# Patient Record
Sex: Male | Born: 1935 | Race: White | Hispanic: No | Marital: Married | Smoking: Former smoker
Health system: Southern US, Community
[De-identification: ages and names within clinical notes are randomized; demographics above are authoritative.]

## PROBLEM LIST (undated history)

## (undated) DIAGNOSIS — I4510 Unspecified right bundle-branch block: Secondary | ICD-10-CM

## (undated) DIAGNOSIS — E7800 Pure hypercholesterolemia, unspecified: Secondary | ICD-10-CM

## (undated) DIAGNOSIS — K219 Gastro-esophageal reflux disease without esophagitis: Secondary | ICD-10-CM

## (undated) DIAGNOSIS — I10 Essential (primary) hypertension: Secondary | ICD-10-CM

## (undated) DIAGNOSIS — N400 Enlarged prostate without lower urinary tract symptoms: Secondary | ICD-10-CM

## (undated) HISTORY — DX: Unspecified right bundle-branch block: I45.10

## (undated) HISTORY — PX: REPLACEMENT TOTAL KNEE: SUR1224

## (undated) HISTORY — PX: BACK SURGERY: SHX140

## (undated) HISTORY — PX: JOINT REPLACEMENT: SHX530

## (undated) HISTORY — DX: Benign prostatic hyperplasia without lower urinary tract symptoms: N40.0

---

## 1998-03-18 ENCOUNTER — Encounter: Admission: RE | Admit: 1998-03-18 | Discharge: 1998-04-15 | Payer: Self-pay | Admitting: Orthopaedic Surgery

## 2000-07-13 ENCOUNTER — Ambulatory Visit (HOSPITAL_COMMUNITY): Admission: RE | Admit: 2000-07-13 | Discharge: 2000-07-13 | Payer: Self-pay | Admitting: Gastroenterology

## 2000-07-13 ENCOUNTER — Encounter (INDEPENDENT_AMBULATORY_CARE_PROVIDER_SITE_OTHER): Payer: Self-pay | Admitting: Specialist

## 2000-09-24 ENCOUNTER — Encounter: Payer: Self-pay | Admitting: Orthopaedic Surgery

## 2000-09-27 ENCOUNTER — Inpatient Hospital Stay (HOSPITAL_COMMUNITY): Admission: RE | Admit: 2000-09-27 | Discharge: 2000-10-01 | Payer: Self-pay | Admitting: Orthopaedic Surgery

## 2001-03-14 ENCOUNTER — Inpatient Hospital Stay (HOSPITAL_COMMUNITY): Admission: RE | Admit: 2001-03-14 | Discharge: 2001-03-19 | Payer: Self-pay | Admitting: Orthopaedic Surgery

## 2001-12-30 ENCOUNTER — Encounter: Admission: RE | Admit: 2001-12-30 | Discharge: 2001-12-30 | Payer: Self-pay | Admitting: Gastroenterology

## 2001-12-30 ENCOUNTER — Encounter: Payer: Self-pay | Admitting: Gastroenterology

## 2002-06-05 ENCOUNTER — Encounter: Payer: Self-pay | Admitting: Gastroenterology

## 2002-06-05 ENCOUNTER — Encounter: Admission: RE | Admit: 2002-06-05 | Discharge: 2002-06-05 | Payer: Self-pay | Admitting: Gastroenterology

## 2003-10-15 ENCOUNTER — Encounter: Admission: RE | Admit: 2003-10-15 | Discharge: 2003-10-15 | Payer: Self-pay | Admitting: Family Medicine

## 2004-03-29 ENCOUNTER — Ambulatory Visit (HOSPITAL_COMMUNITY): Admission: RE | Admit: 2004-03-29 | Discharge: 2004-03-29 | Payer: Self-pay | Admitting: Gastroenterology

## 2006-02-13 HISTORY — PX: EYE SURGERY: SHX253

## 2007-01-27 ENCOUNTER — Emergency Department (HOSPITAL_COMMUNITY): Admission: EM | Admit: 2007-01-27 | Discharge: 2007-01-27 | Payer: Self-pay | Admitting: Emergency Medicine

## 2010-06-21 ENCOUNTER — Other Ambulatory Visit: Payer: Self-pay | Admitting: Family Medicine

## 2010-06-21 DIAGNOSIS — M545 Low back pain: Secondary | ICD-10-CM

## 2010-06-24 ENCOUNTER — Ambulatory Visit
Admission: RE | Admit: 2010-06-24 | Discharge: 2010-06-24 | Disposition: A | Discharge status: Home / Self Care | Payer: Medicare Other | Source: Ambulatory Visit | Attending: Family Medicine | Admitting: Family Medicine

## 2010-06-24 DIAGNOSIS — M545 Low back pain: Secondary | ICD-10-CM

## 2010-07-01 NOTE — Discharge Summary (Signed)
. [facility]  Patient:    Ricky Bradley, NISHIYAMA Visit Number: [id number] MRN: [id number]          Service Type: SUR Location: 5000 5004 [id number] Attending Physician:  Randolm Idol Dictated by:   Jamelle Rushing, P.A. Admit Date:  09/27/2000 Discharge Date: 10/01/2000                             Discharge Summary  ADMISSION DIAGNOSES: 1. Severe osteoarthritis bilateral knees, left greater than right. 2. Gastric reflux. 3. Benign prostatic hypertrophy.  DISCHARGE DIAGNOSES: 1. Left total knee arthroplasty. 2. Gastric reflux. 3. Benign prostatic hypertrophy.  HISTORY OF PRESENT ILLNESS:  The patient is a 77 year old white male with a history of about six years of bilateral knee pain.  Both knees were scoped about five years ago with significant improvement, but over the last year the left pain returned and significantly worsened over the last six months.  The patient had to stop working due to the pain and difficulty ambulating up and down ladders.  He has a catching sensation causing him to fall.  The pain is located mostly in the medial aspect of the knee and has described it as sharp with occasional radiation up into the thigh.  ALLERGIES:  No known drug allergies.  CURRENT MEDICATIONS: 1. Prilosec 10 mg p.o. q.d. 2. Allegra D 1 tablet p.o. q.d. 3. Aleve p.r.n. 4. Advil p.r.n. 5. Iron 325 mg p.o. b.i.d.  SURGICAL PROCEDURE:  On September 27, 2000, the patient was taken to the OR by Dr. Norlene Campbell, assisted by Arnoldo Morale, P.A.-C.  Under general anesthesia, a left total knee replacement was performed with the following components:  DePuy LCS complete standard plus femoral component, a cemented #4 tibial tray with a 12.5 bridging bearing and a metal-backed ______ designed patella.  All of this was secured with polymethyl methacrylate.  The patient tolerated the procedure well.  No Hemovac drain was left in place and the patient did have a  postoperative femoral nerve block for pain control.  ROUTINE CONSULTATIONS:  Physical therapy, occupational therapy, rehab, and case management were requested on August 15.  HOSPITAL COURSE:  On September 27, 2000, the patient was admitted to [facility] under the care of Dr. Norlene Campbell.  The patient was taken to the OR where a left total knee arthroplasty was performed.  The patient tolerated this procedure well and was transferred to the recovery room and then to the orthopedic floor for routine postoperative recovery and rehab.  The patient was placed on Coumadin for routine DVT prophylaxis.  The patient then incurred a four-day postoperative course, in which he had no significant untoward events.  The patient progressed well with physical therapy.  His vital signs remained stable.  He had no significant temperature and he was afebrile on discharge.  His pain was well controlled with p.o. pain medicines after being on a PCA for the first 24 hours.  The patient did progress very nicely with physical therapy and was discharged to home on postop day #4 with arrangements made through [facility] for home health physical therapy and a home health R.N. for pro time draws.  LABORATORY DATA:  EKG on admission was normal sinus rhythm at 86 beats per minute.  Routine CBC on August 18:  WBC 10.1 down from 11.7 day previous, hemoglobin 11.8, hematocrit 34.2, platelets 199.  Coags on August 19:  PT was 19.6 with an INR of 2.0.  August 17:  Sodium 135, potassium 3.5, glucose 155, BUN 6, creatinine 1.1.  Routine urinalysis on admission was negative.  DISCHARGE MEDICATIONS:  1. Tylenol 650 mg p.o. q.4h. p.r.n.  2. Laxative of choice/enema of choice p.r.n.  3. Benadryl 25 mg p.o. q.6h. p.r.n.  4. Colace 100 mg p.o. b.i.d.  5. Claritin 10 mg p.o. q.d.  6. Skelaxin 1-2 tablets every 4-6 hours p.r.n. spasms.  7. Percocet 1 or 2 tablets every 4-6 hours p.r.n. pain.  8. Protonix 40  mg p.o. q.d.  9. Sudafed 120 mg p.o. q.d. 10. Restoril 15 mg p.o. q.h.s. p.r.n. 11. Coumadin 4 mg p.o. q.d.  DISCHARGE INSTRUCTIONS:  1. Medications:  The patient is to continue routine home medications.  2. Percocet 5 mg one or two tablets every four to six hours for pain if     needed.  3. Coumadin 4 mg one tablet a day as directed by [place] pharmacist.  4. Activity:  As tolerated with the use of a walker.  No driving.  5. Diet:  No restrictions.  6. Wound care:  Keep wound clean and dry.  May shower.  Check daily for     infection.  7. Special instructions:  [facility] R.N. pro times first draw on     August 21.  8. Follow-up:  The patient is to call for a followup appointment with     Dr. Cleophas Dunker in 10 days. Dictated by:   Jamelle Rushing, P.A. Attending Physician:  Randolm Idol DD:  11/08/00 TD:  11/08/00 Job: [phone]

## 2010-07-01 NOTE — Discharge Summary (Signed)
[place]. [facility]  Patient:    Ricky Bradley, Ricky Bradley Visit Number: [id number] MRN: [id number]          Service Type: SUR Location: 5000 [id number] [id number] Attending Physician:  Randolm Idol Dictated by:   Arnoldo Morale, P.A.C. Admit Date:  03/14/2001 Discharge Date: 03/19/2001   CC:         Teena Irani. Arlyce Dice, M.D.   Discharge Summary  ADMISSION DIAGNOSES: 1. Osteoarthritis right knee, status post left total knee replacement. 2. Mild gastroesophageal reflux disease. 3. Benign prostatic hypertrophy. 4. Sensitivity to all narcotic pain medications.  DISCHARGE DIAGNOSES: 1. End-stage osteoarthritis right knee, status post left knee replacement. 2. Acute blood loss anemia secondary to surgery. 3. Mild confusion/hallucinations from narcotic pain medications. 4. Possible mild urinary tract infection. 5. Mild gastroesophageal reflux disease. 6. Benign prostatic hypertrophy.  SURGICAL PROCEDURE:  On March 14, 2001, Ricky Bradley underwent a right total knee arthroplasty by Ricky Bradley. Cleophas Bradley, M.D., assisted by Arnoldo Morale, P.A.C., and Ricky Bradley, P.A. student.  COMPLICATIONS:  None.  CONSULTS: 1. Pharmacy consult for Coumadin therapy March 15, 2001. 2. Physical therapy and ortho tech consult March 15, 2001. 3. Occupational therapy consult March 16, 2001. 4. Case management consult March 18, 2001.  HISTORY OF PRESENT ILLNESS:  This 77 year old white male patient presents to Ricky Bradley with a history of a left knee replaced by Ricky Bradley in August of 2002.  He did very well after the left knee replacement and has been having pain in his right knee and wishes to have that replaced. The right knee has been bothering him for about seven to 10 years.  He has had arthroscopy on it in the past. The pain is intermittent sharp pain which becomes knifelike over the posterior lateral aspect of the knee and the center of the joint. There is no other  radiation.  It increases with any strenuous lifting or ambulation on uneven ground.  It decreases with rest.  The knee gives at times and does swell.  He has failed conservative treatment and because of this, he is presenting for a right total knee arthroplasty.  HOSPITAL COURSE:  Mr. Marcucci tolerated his surgical procedure well without immediate postoperative complications. He was subsequently transferred to 5000.  On postop day #1, T-max was 99.6, vital signs were stable.  Hemovac was discontinued.  Hemoglobin was 11.9, hematocrit 34.4.  Leg was neurovascularly intact.  He was switched from PCA to p.o. Vicodin for pain and he was started on PT per protocol.  On postop day #2, he was afebrile, vital signs stable.  Right knee incision well-approximated with staples.  He did complain of some nausea with Vicodin and was switched to Talwin for pain.  He was continued on therapy.  On March 17, 2001, he remained afebrile.  He did have some complaints of hallucinations with the Talwin and that was discontinued.  He was switched only to Tylenol for pain.  On March 18, 2001, T-max was 100, vital signs were stable. Right knee wound was unchanged.  He was having some difficulty with burning with urination and did have a history of prostatitis so a UA and C&S was obtained and he was started on Cipro 250 p.o. b.i.d. just for five days.  On March 19, 2001, he was doing well and reported that the dysuria and frequency had resolved on the Cipro.  T-max at that time was 99.8 and vitals were stable.  Hemoglobin was stable at 10.4 with hematocrit  of 30.5.  He was felt to be stable for discharge home and was discharged home that day.  DISCHARGE INSTRUCTIONS: 1. Diet:  He can resume his prehospitalization diet. 2. Medications:  He can resume all prehospitalization medications except he    is not to resume the Advil while he is on the Coumadin.  Additional    medications then would include:    a.  Prilosec 10 mg p.o. q.d.    b. Allegra 180 mg p.o. q.d.    c. Ferrous sulfate 325 mg p.o. b.i.d.    d. Additional medications from this hospitalization will be:       1. Tylenol one to two tablets p.o. q.4h. p.r.n. for pain.       2. Cipro 250 mg one tablet p.o. b.i.d. seven with no refill.       3. Coumadin 6 mg one tablet p.o. q.d. with [facility] to adjust          the dose as needed.       4. Darvocet-N 100 one to two p.o. q.4h. p.r.n. for severe pain, 30 with          no refill. 3. Activity:  He is to be out of bed partial weightbearing 50% or less on    the right leg with use of a walker.  He is arranged for home health    physical therapy per [facility] and also the R.N. to draw    protimes and pharmacy to monitor his Coumadin therapy. 4. Wound care:  He needs to keep the right knee incision clean and dry.  May    shower after no drainage from the wound for two days.  He is to notify    Ricky Bradley of a temperature greater than or equal to 101.5 degrees F.,    chills, pain unrelieved by pain medications for foul smelling drainage    from the wound. 5. Follow-up:  He needs to follow up with Ricky Bradley on March 27, 2001,    or March 29, 2001, and he needs to call [phone] to set up that    appointment.  He stated good understanding of these instructions and was discharged home.  LABORATORY DATA:  March 15, 2001, white count was 12, hemoglobin 11.9, hematocrit 34.4.  On March 16, 2001, white count 12.4, hemoglobin 11.2, hematocrit 32.2.  On March 17, 2001, white count 12.4, hemoglobin 10.6, hematocrit 30.2.  On March 18, 2001, white count 11.8, hemoglobin 10.8, hematocrit 31.6.  On March 19, 2001, white count 11.4, hemoglobin 10.4, hematocrit 30.5.  On March 16, 2001, his PT was 15.1, INR 1.3.  On March 19, 2001, PT 19.4, INR 1.9.  On March 11, 2001, glucose 129.  On March 15, 2001, potassium 3.4, glucose 158, calcium 8.3.   March 16, 2001, glucose 183, calcium 8.2.  All other  laboratory studies were within normal limits. Dictated by:   Arnoldo Morale, P.A.C. Attending Physician:  Randolm Idol DD:  04/10/01 TD:  04/10/01 Job: [id number]

## 2010-07-01 NOTE — Op Note (Signed)
. [facility]  Patient:    Ricky Bradley, Ricky Bradley                         MRN: [id number] Proc. Date: 09/27/00 Adm. Date:  [id number] Attending:  Randolm Idol                           Operative Report  PREOPERATIVE DIAGNOSIS:  Osteoarthritis, left knee.  POSTOPERATIVE DIAGNOSIS:  Osteoarthritis, left knee.  PROCEDURE:  Left total knee replacement.  SURGEON:  Claude Manges. Cleophas Dunker, M.D.  ASSISTANT:  Arnoldo Morale, P.A.  ANESTHESIA:  General orotracheal.  COMPLICATIONS:  None.  COMPONENTS:  DePuy LCS Complete standard plus femoral component, a cemented #4 tibial tray with a 12.5 mm bridging bearing, and the metal-backed, cruciate-designed patella, all this secured with polymethyl methacrylate.  DESCRIPTION OF PROCEDURE:  With the patient comfortable on the operating table and under general orotracheal anesthesia, the left lower extremity was placed in a thigh tourniquet.  The left leg was then prepped with Betadine scrub and then Duraprep, the tourniquet to the midfoot.  Sterile draping was performed. With the extremity still elevated, it was Esmarch exsanguinated with the proximal tourniquet at 350 mmHg.  A midline longitudinal incision was made and by sharp dissection carried down to subcutaneous tissue.  The first layer of capsule was incised in the midline.  A medial parapatellar incision was then made with the Bovie.  There was little, if any, joint effusion.  There was mild to moderate beefy-red synovial tissue, which was debrided.  The knee was then flexed to 90 degrees, the patella everted 180 degrees. There were large osteophytes along the medial and lateral femoral condyle. There was a fixed varus position, medial release was performed, and osteophytes along the medial tibial plateau, which were also debrided. Preoperatively we had measured either a large or a standard plus femoral component.  The standard plus femoral component was  confirmed intraoperatively.  We also confirmed a #4 tibial tray.  Initial cut was made on the tibia.  We felt that a 10 mm flexion-extension gap were symmetrical. The femoral cuts were then made, sacrificing the ACL and PCL.  A 4 degree distal femoral valgus cut was utilized.  Laminar spreaders were inserted in either compartment to remove medial and lateral menisci as well as remnants of ACL and PCL.  The next to last cut was made at the tibia to accept the tibial stem.  We removed approximately 10 mm of patella to accept the cruciate-backed patellar component.  We inserted all the trial components and felt that we had most stability with a 12.5 mm bridging bearing with equal opening varus and valgus and slight hyperextension.  We were able to flex well beyond 120 degrees without any malrotation of the component or dislocation of the patella.  Trial components were removed.  The joint was then copiously irrigated with jet saline and antibiotic solution.  Retractors were inserted.  The tibia was impacted with polymethyl methacrylate.  Extraneous methacrylate was removed. The 12.5 mm bridging bearing was inserted.  The femoral component was impacted with methacrylate.  Again excess methacrylate was removed.  The joint was placed in extension.  The patella was applied with methacrylate and a patella clamp.  After complete maturation of the methacrylate, the joint was inspected.  There were several areas of extraneous methacrylate still present.  These were removed with an osteotome.  The joint was again placed through a full range of motion with excellent stability.  We felt that our flexion-extension gaps were symmetrical at that point.  The tourniquet was deflated, gross bleeders Bovie coagulated.  The deep capsule was closed with interrupted 0 Tycron, superficial capsule closed with running 0 Vicryl, subcutaneous tissue with 2-0 Vicryl, and the skin closed with skin clips.  Sterile  bulky dressing was applied, followed by an Ace bandage with a knee immobilizer.  The patient was to receive a postanesthesia femoral nerve block for pain control. DD:  09/27/00 TD:  09/27/00 Job: [id number]

## 2010-07-01 NOTE — H&P (Signed)
[place]. [facility]  Patient:    Ricky Bradley, Ricky Bradley                        MRN: [id number] Adm. Date:  09/27/00 Attending:  Claude Manges. Cleophas Dunker, M.D. Dictator:   Jamelle Rushing, P.A.                         History and Physical  DATE OF BIRTH:  05-Aug-1933.  CHIEF COMPLAINT:  Bilateral knee pain, left greater than right.  HISTORY OF PRESENT ILLNESS:  The patient is a 77 year old white male with about a 5 to 6 year history of bilateral knee pain.  Approximately 4 to 5 years ago the patient had a left knee arthroscope for evaluation and debridement and did have improvement up until about 1 year ago.  The patient stated he had return of his knee discomfort which significantly worsened over the last 6 months.  The patient had significant difficulty with ambulation and climbing ladders and he had to stop working due to his discomfort.  The pain was located over the medial joint line.  He does have a catching sensation. He has difficulty with range of motion, he has night pain.  The pain is described as a quick sharp pain with ackward movements and position.  It does occasionally radiate up into the hip.  The problems with his right knee are similar but currently is not a significant problem.  ALLERGIES:  No known drug allergies.  CURRENT MEDICATIONS: 1. Prilosec 10 mg p.o. q.d. 2. Alegra D 1 tablet p.o. q.d. 3. Aleve p.r.n. 4. Advil p.r.n. 5. Ferrous sulfate 325 mg p.o. b.i.d.  PREVIOUS MEDICAL HISTORY:  The patient states he has a nervous stomach with increased stomach gas for which he takes Prilosec and it gives him relief. The patient also has a history of BPH.  The patient also recently had a significant history of left-sided sinus infection with an onset around Mothers Day, and had most of his symptoms relieved after an evaluation of multiple times and several different antibiotics.  The patient was evaluated a week ago by his ENT and was described to  be completely relieved of signs but the patient still has symptoms of some pressure behind his left ear. Otherwise the patient denies any other specific history of diabetes, hypertension, thyroid disease, hiatal hernia, peptic ulcer disease, heart disease or asthma.  SURGICAL HISTORY: 1. Vasectomy in 1967. 2. Nose surgery x 2. 3. Right and left arthroscopic surgery.  The patient denies any complications with any of the above mentioned surgical procedures other than some nausea with anesthesia.  SOCIAL HISTORY:  The patient is a 77 year old white male who appears to be physically fit.  Denies any history of smoking or alcohol use.  The patient is currently married.  He does have 3 children.  He lives in a 2 story house with 3 steps at the main entrance.  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  FAMILY PHYSICIAN:  Kristian Covey, M.D. at [facility], [phone].  FAMILY HISTORY:  Mother is alive in good health with a history of hypertension at 31 years of age.  Father is deceased at 26 years of age from heart failure. The patient has 3 sisters alive, all in good health.  REVIEW OF SYSTEMS:  Positive for recent cold and sinusitis predominantly on his left  side.  The patient still has some residual full feeling behind his left ear.  He has had multiple courses of amoxicillin, Augmentin, and a Z-Pak and has been evaluated by an ENT for this problem.  The patient does use glasses for reading and driving.  The patient does have some shortness of breath with deconditioning due to his bilateral knee pain.  The patient states he does have occasional problems with increased urgency and urinary frequency.  PHYSICAL EXAMINATION:  VITAL SIGNS:  Height is 6 foot, weight is 200 pounds.  Pulse is 96, respirations 12, temperature 98.0.  Blood pressure 132/84.  GENERAL:  This is a healthy appearing adult male of appeared stated age.  He does ambulate  with a left-sided limp.  He does appear to have some varus deformity of his left leg.  The patient is able to get on and off the examination table without much difficulty.  HEENT:  Normocephalic and atraumatic, nontender over maxillary or frontal sinuses.  Pupils, equal, round, reactive to light and accommodation. Extraocular movements are intact. Sclerae is nonicteric.  Conjunctivae is pink and moist.  External ears without deformities.  Canals patent.  TMs were pearly gray on the right.  Left, they were pearly gray with some slight erythematous ejection down the middle.  There was no obvious signs of purulence or serous fluid behind the TMs.  Canals were nonerythematous.  Gross hearing was intact.  Traguses were nontender to manipulation.  Nasal septum was midline.  Mucous membranes pink and moist.  No polyps noted.  Oral buccal mucosa was pink and moist without lesions.  Dentition was in fair repair, uvula was midline and moves symmetrically with phonation.  NECK:  Supple, no palpable lymphadenopathy.  Thyroid gland was nontender.  The patient had good range of motion of his cervical spine without any difficulty or tenderness.  CHEST: Lung sounds were clear and equal bilaterally.  No wheezes, rales, rhonchis or rubs noted.  HEART:  Regular rate and rhythm with S1, S2 auscultated.  No murmur, rub or gallops noted.  ABDOMEN:  Round, soft, nontender, bowel sounds were present throughout.  No hepatosplenomegaly notable.  CVA was nontender to percussion.  EXTREMITIES:  Upper extremities were symmetrical in size and shape and the patient had good range of motion of his shoulders, elbows and wrists without any difficulty.  He had 5/5 motor strength in all muscle groups.  LOWER EXTREMITIES:  Right and left hip had symmetrical range of motion with full extension, flexion, back to 130 degrees, 20-30 degrees internal external rotation without any difficulty.  The left knee had medial joint  line tenderness.  No palpable tenderness along the lateral joint line.  No palpable  effusion.  No sign of erythema or ecchymosis.  He had a moderate amount of crepitus with full extension and flexion back to 115 degrees.  He had about 5 degrees valgus-varus laxity.  He did have a palpable bakers cyst and he had no calf tenderness.  Right knee had minimal amount of medial and lateral joint line tenderness.  He had no sign of erythema or ecchymosis.  No effusion. Range of motion was full extension and flexion down to 115.  He had a minimal amount of crepitus with range of motion.  Calf was nontender.  Bilateral ankles were symmetrically size and shape with good dorsi and plantar flexion.  NEUROLOGIC:  The patient was conscious, alert and appropriate.  Held an easy conversation with the examiner.  Cranial nerves II-XII were  grossly intact. Deep tendon reflexes of the upper and lower extremities were symmetrical right to left.  The patient was grossly intact to light touch sensation from head to toe.  PERIPHERAL VASCULATURE:  Carotid pulses 2+, radial pulses 2+, femoral pulses 1+.  Dorsalis pedis and posterior tibial pulses were 2+.  The patient had no lower extremity edema or venous stasis changes noted.  BREAST/RECTAL AND GU EXAMINATIONS:  Deferred at this time.  IMPRESSION: 1. Severe osteoarthritis bilateral knees left greater than right. 2. Gastric reflux. 3. Benign prostatic hypertrophy.  PLAN:  The patient will be admitted to [facility] on September 27, 2000, under the care of Dr. Norlene Campbell.  The patient has donated 2 units of autologous blood.  The patient will undergo all routine labs and tests for his planned left total knee arthroplasty on September 27, 2000. DD:  09/24/00 TD:  09/24/00 Job: [id number]

## 2010-07-01 NOTE — Op Note (Signed)
Ricky Bradley, Ricky Bradley                  ACCOUNT NO.:  [id number]   MEDICAL RECORD NO.:  [id number]          PATIENT TYPE:  AMB   LOCATION:  ENDO                         FACILITY:  [facility]   PHYSICIAN:  Petra Kuba, M.D.    DATE OF BIRTH:  October 11, 1933   DATE OF PROCEDURE:  03/29/2004  DATE OF DISCHARGE:                                 OPERATIVE REPORT   PROCEDURE:  Colonoscopy.   INDICATIONS FOR PROCEDURE:  Right-sided abdominal pain.  Due for colonic  screening.   CONSENT:  Consent was sighed after the risks, benefits, methods, and options  were thoroughly discussed in the office on multiple occasions.   MEDICATIONS GIVEN:  Demerol 80, Versed 8.   DESCRIPTION OF PROCEDURE:  Rectal inspection was pertinent for external  hemorrhoids, small.  Digital exam was negative.  The video pediatric  adjustable colonoscope was inserted and fairly easily advanced around the  colon to the cecum.  This did require some abdominal pressure but no  position changes.  On insertion, a rare transverse erosion was seen,  probably nonsteroidal-induced.  The cecum was identified by the appendiceal  orifice and the ileocecal valve.  With abdominal pressure, the scope was  able to be advanced into the terminal ileum which was normal.  Photo  documentation was obtained.  The scope was slowly withdrawn.  The prep was  adequate.  There were some occasional right and left-sided diverticula, but  no other abnormalities were seen as we slowly withdrew back to the rectum.  Anorectal pullthrough and retroflexion confirmed some small hemorrhoids.  The scope was straightened and readvanced a short ways up the left side of  the colon, air was suctioned, and the scope removed.  The patient tolerated  the procedure well.  There were no obvious immediate complications.   ENDOSCOPIC DIAGNOSES:  1.  Internal and external hemorrhoids.  2.  Occasional left and rare right diverticula.  3.  Rare transverse erosion, probably  nonsteroidal-induced.  4.  Otherwise within normal limits to the terminal ileum.   PLAN:  Repeat screening in five years.  Consider CAT scan next.  Otherwise,  will check off his chart and see what other antispasmodics we can try which  might be helpful.  He seems to be having some side effects from Robinul,  even though it tends to help.  He will follow up in two months or he can  call me sooner p.r.n.      MEM/MEDQ  D:  03/29/2004  T:  03/29/2004  Job:  [id number]   cc:   [facility]

## 2010-07-01 NOTE — H&P (Signed)
[place]. [facility]  Patient:    Ricky Bradley, Ricky Bradley Visit Number: [id number] MRN: [id number]          Service Type: SUR Location: RCRM 2550 05 Attending Physician:  Randolm Idol Dictated by:   Arnoldo Morale, P.A.-C. Admit Date:  03/14/2001                           History and Physical  DATE OF BIRTH:  May 31, 1933  CHIEF COMPLAINT:  Right knee pain for the last seven to 10 years.  HISTORY OF PRESENT ILLNESS:  This 77 year old white male patient presents to Dr. Claude Manges. [place] with a history of a left knee replacement by Dr. Cleophas Dunker on September 27, 2000.  He has done very well after his left knee replacement, and wishes to have his right knee replaced.  He has had a 7-10 year history of progressively worsening right knee pain.  No known injury to the knee in the past, but he does have a history of a right knee arthroscopy by Dr. Paul Dykes. [place] Ruts. in 1985, or 1986.  At this point the pain in the right knee is described as an intermittent sore or sharp-type of pain, which becomes knife-like at times.  It seems to be located mainly around the posterolateral aspect of the knee, and then also again in the center of the joint.  It is present with any strenuous lifting or if he has to ambulate on uneven ground or stand or walk for a prolonged period of time.  It decreases with rest.  The knee does give way at times and swells occasionally.  There is no popping, catching, grinding, or pain at night.  He is not currently taking any medications for pain, and he has not tried cortisone or Hyalgan in the past.  He is using a pullover knee sleeve when he is up and about, and he has not required the use of any assistive devices.  ALLERGIES:  OXYCODONE caused severe nervousness and hallucinations.  CURRENT MEDICATIONS 1. Prilosec 10 mg one tab p.o. q.d. 2. Allegra 180 mg one tab p.o. q.d. 3. Advil 200 mg, three tab p.o. t.i.d. p.r.n. pain. 4. Ferrous  sulfate 325 mg one tab p.o. b.i.d.  PAST MEDICAL HISTORY 1. He complains of a nervous stomach with increased gas, for which he    is treated with Prilosec. 2. He also has a history of benign prostatic hypertrophy which he has    recently had some problems with urinary retention.  He was taken off    of Allegra D and placed just on Allegra.  He denies any history of high blood pressure, diabetes mellitus, thyroid disease, hiatal hernia, peptic ulcer disease, heart disease, asthma, or any other chronic medical condition other than noted previously.  PAST SURGICAL HISTORY 1. Vasectomy by Dr. Marta Lamas in 1967. 2. Nasal surgery by Dr. Kristian Covey in 1975. 3. Right knee arthroscopy by Dr. Fannie Knee in 1986. 4. Left knee arthroscopy by Dr. Cleophas Dunker in 1995. 5. Excision of sinus polyps by Dr. Keturah Barre in 1996. 6. Left total knee arthroscopy on September 27, 2000, by Dr. Cleophas Dunker.  SOCIAL HISTORY:  He does report he smokes an occasional cigar.  He denies any alcohol use or drug use.  He is married and has three children.  He currently lives in a two-story house with his wife, with three steps into the main entrance.  He is retired from [employer], and is a retired Visual merchandiser.  His medical doctor is Dr. Teena Irani. Arlyce Dice at [facility], and their phone [phone].  FAMILY HISTORY:  His mother is alive at age 69, with a history of hypertension.  His father passed away at age 40, with heart failure.  He has three sisters ages 20, 19, and 109.  They are all alive and well.  His three children are two boys ages 53 and 101, and a daughter age 60.  They are all alive and healthy.  REVIEW OF SYSTEMS:  He does use glasses for reading and driving.  He has shortness of breath ambulating at a fast pace for about 100 yards.  He believes this is due to his right knee pain.  He does complain of some urinary urgency and frequency, and he has nocturia once during the night.  He has  some problems with constipation, due to the iron, and had a lot of difficulty tolerating the narcotics after his last surgery.  He really got himself off of narcotics as soon as possible, and did have problems with constipation.  He did donate 2 units of autologous blood.  All other systems are negative and noncontributory at this time.  PHYSICAL EXAMINATION  GENERAL:  A well-developed, well-nourished white male, in no acute distress. He has a very slight limp on the right.  Mood and affect are appropriate.  He talks easily with the examiner.  Height 6 feet 0 inches, weight 210 pounds. BMI is 27-1/2.  VITAL SIGNS:  Temperature 97 degrees F, pulse 104, respirations 16, blood pressure 124/70.  HEENT:  Normocephalic, atraumatic without frontal or maxillary sinus tenderness to palpation.  Conjunctivae pink.  Sclerae anicteric.  PERRLA. EOMs intact.  Funduscopic exam shows good red reflex bilaterally with normal-appearing retinal vasculature, optic disc and cup.  No visible external ear deformities.  Hearing is grossly intact.  Tympanic membranes pearly gray bilaterally with good light reflex.  Nose and nasal septum midline.  Nasal mucosa pink and moist, without exudates or polyps noted.  Buccal mucosa pink and moist.  Dentition in fair repair with multiple fillings in place.  Pharynx without erythema or exudates.  Tongue and uvula midline.  Tongue is without fasciculations.  Uvula arises equally with phonation.  NECK:  No visible masses or lesions noted.  Trachea midline.  No palpable lymphadenopathy or thyromegaly.  Carotids +2 bilaterally without bruits. Nontender to palpation, with a full range of motion of his cervical spine.  CARDIOVASCULAR:  Heart rate and rhythm regular.  S1, S2 present, without rubs, clicks, or murmurs noted.  LUNGS:  Respirations even and unlabored.  Breath sounds clear to auscultation bilaterally without rales or wheezes noted.  ABDOMEN:  Rounded abdominal  contour.  Bowel sounds present x four quadrants. Soft, nontender to palpation, without hepatosplenomegaly or CVA tenderness.   EXTREMITIES:  Femoral pulses +2 bilaterally.  BACK:  Nontender to palpation along the entire length of the vertebral column.  BREASTS/GU/RECTAL:  These examinations are deferred at this time.  MUSCULOSKELETAL:  No obvious deformity in the bilateral upper extremities, with full range of motion of these extremities, without pain.  Radial pulses are +2 bilaterally.  He has a full range of motion of his hips, ankles, and toes bilaterally.  DP and PT pulses are +2.  Left knee has a well-healed midline incision with full extension and flexion to about 105 degrees.  There is no effusion in the knee, and no  crepitus.  No pain with palpation along the joint line.  The knee is stable to varus and valgus stress.  The right knee has no obvious deformity.  The skin is intact without erythema or ecchymosis. There is no effusion of the knee.  He does have a moderate amount of crepitus with range of motion of the right knee.  He has full extension and flexion to 105 degrees.  His calves are nontender bilaterally.  The right knee is stable to varus and valgus stress.  DP and PT pulses are +2 bilaterally.  NEUROLOGIC:  Alert and oriented x 3.  Cranial nerves II-XII are grossly intact.  Strength is 5/5 bilateral upper and lower extremities.  Rapid alternating movements intact.  Deep tendon reflexes 2+ bilateral upper and lower extremities.  RADIOLOGICAL FINDINGS:  X-rays taken in June 2002, of the right knee show bone on bone contact in the medial compartment of the right knee, with significant osteophytes.  IMPRESSION 1. Osteoarthritis, right knee. 2. Status post left knee replacement. 3. Mild reflux. 4. Benign prostatic hypertrophy.  PLAN:  Mr. Stamour will be admitted to [facility] on March 14, 2001, where he will undergo a right total knee  arthroplasty by Dr. Cleophas Dunker.  He will undergo all the routine preoperative laboratory tests and studies prior to this procedure.  If we have any medical issues while he is hospitalized, we will consult Dr. Dara Hoyer. Dictated by:   Arnoldo Morale, P.A.-C. Attending Physician:  Randolm Idol DD:  03/12/01 TD:  03/12/01 Job: [id number]

## 2010-07-01 NOTE — Procedures (Signed)
[place]. [facility]  Patient:    Ricky Bradley, Ricky Bradley                           MRN: [id number] Proc. Date: 07/13/00 Attending:  Petra Kuba, M.D. CC:         Delorse Lek, M.D.   Procedure Report  PROCEDURE:  Colonoscopy with polypectomy.  INDICATION:  Patient with a history of colon polyps, here for repeat screening.  Consent was signed after risks, benefits, methods, and options thoroughly discussed multiple times in the past.  MEDICATIONS:  Demerol 90 mg, Versed 10 mg.  DESCRIPTION OF PROCEDURE:  Rectal inspection was pertinent for external hemorrhoids.  Digital exam was negative.  The pediatric video colonoscope was inserted, easily advanced around the colon to the cecum.  We had used the regular scope in the past and would probably want to use that in the future. Unfortunately, none were available.  The cecum was identified by the appendiceal orifice and the ileocecal valve.  On insertion, some rare left-sided diverticula were seen but no other abnormalities.  Prep on the right side of the colon and the cecum were found.  There was some stool adherent to the wall on the right which could not be washed off.  The rest of the prep was adequate.  Lots of washing and suctioning were done on the right side.  Small lesions could have been missed, but no large lesion was seen. The scope was then slowly withdrawn.  The ascending was normal, as was most of the transverse.  In the distal transverse, the occasional tiny to small polyp was seen, as was the mid-descending and sigmoid, and multiple hot biopsies were obtained.  Some polyps required two hot biopsies.  The scope was slowly withdrawn back to the rectum.  Probably a total of six tiny to small polyps were seen on the transverse, descending, and sigmoid and were all hot biopsied, put in the same container.  A rare left-sided diverticulum was seen. Back in the rectum, some hyperplastic-appearing polyps  were seen.  A couple were hot biopsied, one was cold biopsied.  The slightly larger one was hot biopsied x 2.  None were worrisome-looking and all were tiny to small.  The scope was retroflexed, revealing some internal hemorrhoids.  The scope was straightened, air withdrawn, and the scope removed.  Patient tolerated the procedure well.  There was no obvious immediate complication.  ENDOSCOPIC DIAGNOSES: 1. Internal-external hemorrhoids. 2. Rare left-sided diverticula. 3. Multiple tiny to small polyps in the rectum, sigmoid, descending, and    transverse, mostly hot biopsied, occasional cold biopsy of a hyperplastic-    appearing one. 4. Otherwise within normal limits to the cecum with fair prep in the cecum and    ascending.  Small lesions could have been missed in that area.  PLAN:  Await pathology to determine future colonic screening.  GI follow-up p.r.n.  Routine postpolypectomy instructions.  Yearly rectals and guaiacs per the [employer], but happy to see back as above. DD:  07/13/00 TD:  07/13/00 Job: [id number]

## 2010-07-01 NOTE — Op Note (Signed)
[place]. [facility]  Patient:    Ricky Bradley, Ricky Bradley Visit Number: [id number] MRN: [id number]          Service Type: SUR Location: 5000 5036 01 Attending Physician:  Randolm Idol Dictated by:   Claude Manges. Cleophas Dunker, M.D. Proc. Date: 03/14/01 Admit Date:  03/14/2001                             Operative Report  PREOPERATIVE DIAGNOSIS:  End stage osteoarthritis, right knee.  POSTOPERATIVE DIAGNOSIS:  End-stage osteoarthritis, right knee.  OPERATION PERFORMED:  Right total knee replacement.  SURGEON:  Claude Manges. Cleophas Dunker, M.D.  ASSISTANT:  Arnoldo Morale, P.A.  ANESTHESIA:  General orotracheal.  COMPLICATIONS:  None.  COMPONENTS:  Depuy LCS complete standard plus cemented femoral component, #5 rotating tibial platform cemented with a 15 mm bridging bearing and a metal back cruciate designed rotating patella cemented.  DESCRIPTION OF PROCEDURE:  With the patient comfortable on the operating table and under general orotracheal anesthesia, the right lower extremity was placed in the thigh tourniquet.  The leg was then prepped with Betadine scrub and then DuraPrep from the tourniquet to the midfoot.  Sterile draping was performed.  With the extremity still elevated, it was Esmarch exsanguinated with the proximal tourniquet at 350 mHg.  A midline longitudinal incision was made centered over the patella extending from the superior pouch to the tibial tubercle.  By sharp dissection, the incision was carried down to the subcutaneous tissue through the first layer of capsule.  A medial parapatellar incision was then made through the deep capsule.  The patella was everted 180 degrees with the knee flexed at 90 degrees.  There was approximately 10 to 15 cc clear yellow joint effusion. There was an abundant amount of synovitis.  A synovectomy was performed. There were large osteophytes along the patella circumferentially in both medial and lateral femoral  condyle.  There was complete absence of articular cartilage although on the medial femoral condyle and at least half of the medial tibial plateau with the varus position of the knee.  I could correct it easily to neutral, i.e. it was not fixed.  Preoperatively, we had templated either a large or a standard plus femoral component.  The standard plus component was confirmed intraoperatively.  We also had measured either a 4 or 5 tibial tray and the 5 was confirmed intraoperatively.  The appropriate femoral and tibial jigs to obtain the appropriate femoral and tibial cuts.  We used a 4 degree distal femoral valgus bony cut.  LCL and MCL remained intact. PCL and ACL were sacrificed.  A 12.5 mm flexion and extension gap were perfectly symmetrical.  Lamina spreaders were inserted to remove medial and lateral menisci and remnants of ACL and PCL as well as spurs behind the femur.  There were osteophytes along the medial tibial plateau.  The tibial tray was then applied and the appropriate cuts made for the keeled tibial component.  The trial components were then applied.  We trialed the 12.5 mm bridging bearing and actually thought that a 15 mm tighter and still allowed slight hyperextension and a 50 was a much better fit.  Through a full range of motion the components were perfectly stable.  The patella was then applied by removing 10 mm of bone leaving 14 mm of thickness.  Cruciate design was then applied.  The patella was reduced and placed through a full range of  motion without lateral subluxation.  The trial components were then removed.  The joint was copiously irrigated with saline solution, antibiotic solution.  Each of the final components was then applied with polymethyl methacrylate, i.e. the #5 rotating tibial platform with a 15 mm bridging bearing and the standard plus femoral component.  Extraneous methacrylate was removed.  The patella was applied with cement and a bone clamp.   Extraneous methacrylate was removed from it circumferentially.  The knee was placed in full extension.  We thought we had excellent position of the components.  After complete maturation of the methacrylate, the knee was placed through a full range of motion with instability and further extraneous methacrylate was removed with an osteotome. Again the joint was irrigated with antibiotic solution.  Tourniquet deflated and bleeders were Bovie coagulated.  Hemovac inserted.  The deep capsule was then closed with interrupted #1 Ethibond.  Superficial capsule with running 0 Vicryl, the subcutaneous with 2-0 Vicryl, the skin closed with skin clips. Sterile bulky dressing was applied.  Femoral nerve was to be applied by Anesthesia for postoperative pain control.  The patient tolerated the procedure without complication. Dictated by:   Claude Manges. Cleophas Dunker, M.D. Attending Physician:  Randolm Idol DD:  03/14/01 TD:  03/14/01 Job: [phone] [phone] [id number]

## 2010-08-14 HISTORY — PX: OTHER SURGICAL HISTORY: SHX169

## 2010-08-18 ENCOUNTER — Other Ambulatory Visit (HOSPITAL_COMMUNITY): Payer: Self-pay | Admitting: Neurosurgery

## 2010-08-18 ENCOUNTER — Encounter (HOSPITAL_COMMUNITY)
Admission: RE | Admit: 2010-08-18 | Discharge: 2010-08-18 | Disposition: A | Discharge status: Home / Self Care | Payer: Medicare Other | Source: Ambulatory Visit | Attending: Neurosurgery | Admitting: Neurosurgery

## 2010-08-18 ENCOUNTER — Ambulatory Visit (HOSPITAL_COMMUNITY)
Admission: RE | Admit: 2010-08-18 | Discharge: 2010-08-18 | Disposition: A | Discharge status: Home / Self Care | Payer: Medicare Other | Source: Ambulatory Visit | Attending: Neurosurgery | Admitting: Neurosurgery

## 2010-08-18 DIAGNOSIS — I10 Essential (primary) hypertension: Secondary | ICD-10-CM | POA: Insufficient documentation

## 2010-08-18 DIAGNOSIS — M48061 Spinal stenosis, lumbar region without neurogenic claudication: Secondary | ICD-10-CM

## 2010-08-18 DIAGNOSIS — Z01812 Encounter for preprocedural laboratory examination: Secondary | ICD-10-CM | POA: Insufficient documentation

## 2010-08-18 DIAGNOSIS — Z01810 Encounter for preprocedural cardiovascular examination: Secondary | ICD-10-CM | POA: Insufficient documentation

## 2010-08-18 DIAGNOSIS — Z01811 Encounter for preprocedural respiratory examination: Secondary | ICD-10-CM | POA: Insufficient documentation

## 2010-08-18 LAB — CBC
HCT: 42.6 % (ref 39.0–52.0)
Hemoglobin: 15.2 g/dL (ref 13.0–17.0)
MCV: 83.2 fL (ref 78.0–100.0)
WBC: 8.9 10*3/uL (ref 4.0–10.5)

## 2010-08-18 LAB — BASIC METABOLIC PANEL
BUN: 17 mg/dL (ref 6–23)
Chloride: 104 mEq/L (ref 96–112)
Glucose, Bld: 163 mg/dL — ABNORMAL HIGH (ref 70–99)
Potassium: 4 mEq/L (ref 3.5–5.1)

## 2010-08-22 ENCOUNTER — Encounter: Payer: Self-pay | Admitting: *Deleted

## 2010-08-22 ENCOUNTER — Emergency Department (INDEPENDENT_AMBULATORY_CARE_PROVIDER_SITE_OTHER): Payer: Medicare Other

## 2010-08-22 ENCOUNTER — Emergency Department (HOSPITAL_BASED_OUTPATIENT_CLINIC_OR_DEPARTMENT_OTHER)
Admission: EM | Admit: 2010-08-22 | Discharge: 2010-08-22 | Disposition: A | Discharge status: Home / Self Care | Payer: Medicare Other | Source: Home / Self Care | Attending: Emergency Medicine | Admitting: Emergency Medicine

## 2010-08-22 DIAGNOSIS — M545 Low back pain: Secondary | ICD-10-CM

## 2010-08-22 DIAGNOSIS — N419 Inflammatory disease of prostate, unspecified: Secondary | ICD-10-CM | POA: Insufficient documentation

## 2010-08-22 DIAGNOSIS — K5900 Constipation, unspecified: Secondary | ICD-10-CM

## 2010-08-22 DIAGNOSIS — R319 Hematuria, unspecified: Secondary | ICD-10-CM

## 2010-08-22 DIAGNOSIS — R109 Unspecified abdominal pain: Secondary | ICD-10-CM | POA: Insufficient documentation

## 2010-08-22 DIAGNOSIS — N400 Enlarged prostate without lower urinary tract symptoms: Secondary | ICD-10-CM

## 2010-08-22 HISTORY — DX: Gastro-esophageal reflux disease without esophagitis: K21.9

## 2010-08-22 HISTORY — DX: Pure hypercholesterolemia, unspecified: E78.00

## 2010-08-22 HISTORY — DX: Essential (primary) hypertension: I10

## 2010-08-22 LAB — CBC
HCT: 42.7 % (ref 39.0–52.0)
Hemoglobin: 15.3 g/dL (ref 13.0–17.0)
RBC: 5.25 MIL/uL (ref 4.22–5.81)
RDW: 13.3 % (ref 11.5–15.5)
WBC: 9.8 10*3/uL (ref 4.0–10.5)

## 2010-08-22 LAB — COMPREHENSIVE METABOLIC PANEL
ALT: 16 U/L (ref 0–53)
AST: 15 U/L (ref 0–37)
CO2: 26 mEq/L (ref 19–32)
Chloride: 103 mEq/L (ref 96–112)
Creatinine, Ser: 1 mg/dL (ref 0.50–1.35)
GFR calc non Af Amer: >60 mL/min (ref >60)
Total Bilirubin: 0.5 mg/dL (ref 0.3–1.2)

## 2010-08-22 LAB — URINALYSIS, ROUTINE W REFLEX MICROSCOPIC
Glucose, UA: NEGATIVE mg/dL
Leukocytes, UA: NEGATIVE
Specific Gravity, Urine: 1.005 (ref 1.005–1.030)
pH: 7 (ref 5.0–8.0)

## 2010-08-22 LAB — DIFFERENTIAL
Basophils Absolute: 0 10*3/uL (ref 0.0–0.1)
Lymphocytes Relative: 21 % (ref 12–46)
Monocytes Absolute: 0.8 10*3/uL (ref 0.1–1.0)
Monocytes Relative: 9 % (ref 3–12)
Neutro Abs: 6.6 10*3/uL (ref 1.7–7.7)

## 2010-08-22 LAB — LACTIC ACID, PLASMA: Lactic Acid, Venous: 1.6 mmol/L (ref 0.5–2.2)

## 2010-08-22 MED ORDER — FENTANYL CITRATE 0.05 MG/ML IJ SOLN
100.0000 ug | Freq: Once | INTRAMUSCULAR | Status: AC
  Administered 2010-08-22: 100 ug via INTRAVENOUS
  Filled 2010-08-22: qty 2

## 2010-08-22 MED ORDER — CIPROFLOXACIN HCL 500 MG PO TABS: 500.0000 mg | ORAL_TABLET | Freq: Two times a day (BID) | ORAL | Status: AC

## 2010-08-22 NOTE — ED Notes (Signed)
To Ed via EMS c.o lower bil abd pain, bloating, constipation. No BM in 2 days. Today freq urination 10-20 x's before calling EMS. Has appointment for back surgery in 2 more days.

## 2010-08-22 NOTE — ED Provider Notes (Addendum)
History     Chief Complaint  Patient presents with  . Abdominal Pain  . Constipation   Patient is a 77 y.o. male presenting with abdominal pain and constipation. The history is provided by the patient. No language interpreter was used.  Abdominal Pain The primary symptoms of the illness include abdominal pain. The primary symptoms of the illness do not include fever, fatigue, shortness of breath, vomiting, diarrhea, hematemesis or hematochezia. Primary symptoms comment: urinary frequency and flank pain and constipation The current episode started more than 2 days ago. The onset of the illness was gradual. The problem has not changed since onset. The illness is associated with a recent illness (patient is scheduled for back surgery this week). Additional symptoms associated with the illness include constipation and frequency. Symptoms associated with the illness do not include chills, anorexia or diaphoresis. Significant associated medical issues do not include PUD, GERD, inflammatory bowel disease, gallstones, substance abuse, diverticulitis or HIV.  Constipation  Associated symptoms include abdominal pain. Pertinent negatives include no anorexia, no fever, no diarrhea, no hematemesis and no vomiting. His past medical history does not include inflammatory bowel disease.    Past Medical History  Diagnosis Date  . Hypertension   . GERD (gastroesophageal reflux disease)   . High cholesterol     History reviewed. No pertinent past surgical history.  History reviewed. No pertinent family history.  History  Substance Use Topics  . Smoking status: Former Games developer  . Smokeless tobacco: Not on file  . Alcohol Use: No      Review of Systems  Constitutional: Negative for fever, chills, diaphoresis and fatigue.  Eyes: Negative for discharge.  Respiratory: Negative for shortness of breath.   Gastrointestinal: Positive for abdominal pain and constipation. Negative for vomiting, diarrhea,  hematochezia, anorexia and hematemesis.  Genitourinary: Positive for frequency.  Musculoskeletal: Negative.   Neurological: Negative.   Hematological: Negative.   Psychiatric/Behavioral: Negative.     Physical Exam  BP 175/102  Pulse 110  Temp(Src) 98 F (36.7 C) (Oral)  SpO2 98%  Physical Exam  Constitutional: He appears well-developed and well-nourished.  HENT:  Head: Normocephalic and atraumatic.  Eyes: Pupils are equal, round, and reactive to light.  Neck: Normal range of motion. Neck supple.  Cardiovascular: Normal rate and regular rhythm.   Pulmonary/Chest: Effort normal and breath sounds normal.  Abdominal: Soft. Bowel sounds are normal. There is no rebound.  Musculoskeletal: Normal range of motion.  Neurological: He is alert.  Skin: Skin is warm and dry.  Psychiatric: His behavior is normal.    ED Course  Procedures  MDM Previous records reviewed      Domenique Southers K Preet Mangano-Rasch, MD 08/22/10 2123  Jasmine Awe, MD 09/18/10 1742

## 2010-08-22 NOTE — ED Notes (Signed)
ZOX:[id number]<AV> Expected date:08/22/10<BR> Expected time: 5:27 PM<BR> Means of arrival:Ambulance<BR> Comments:<BR> [facility] Enroute 77yo Constipation

## 2010-08-24 ENCOUNTER — Inpatient Hospital Stay (HOSPITAL_COMMUNITY)
Admission: RE | Admit: 2010-08-24 | Discharge: 2010-08-27 | DRG: 491 | Disposition: A | Discharge status: Home / Self Care | Payer: Medicare Other | Source: Ambulatory Visit | Attending: Neurosurgery | Admitting: Neurosurgery

## 2010-08-24 ENCOUNTER — Inpatient Hospital Stay (HOSPITAL_COMMUNITY): Payer: Medicare Other

## 2010-08-24 DIAGNOSIS — M48061 Spinal stenosis, lumbar region without neurogenic claudication: Principal | ICD-10-CM | POA: Diagnosis present

## 2010-08-24 DIAGNOSIS — IMO0002 Reserved for concepts with insufficient information to code with codable children: Secondary | ICD-10-CM | POA: Diagnosis not present

## 2010-08-24 DIAGNOSIS — R339 Retention of urine, unspecified: Secondary | ICD-10-CM | POA: Diagnosis not present

## 2010-08-24 DIAGNOSIS — N9989 Other postprocedural complications and disorders of genitourinary system: Secondary | ICD-10-CM | POA: Diagnosis not present

## 2010-08-24 DIAGNOSIS — Y838 Other surgical procedures as the cause of abnormal reaction of the patient, or of later complication, without mention of misadventure at the time of the procedure: Secondary | ICD-10-CM | POA: Diagnosis not present

## 2010-08-24 DIAGNOSIS — Z01812 Encounter for preprocedural laboratory examination: Secondary | ICD-10-CM

## 2010-08-25 NOTE — Op Note (Signed)
Ricky Bradley, HAGOS NO.:  [id number]  MEDICAL RECORD NO.:  [id number]  LOCATION:  3535                         FACILITY:  [facility]  PHYSICIAN:  Cristi Loron, M.D.DATE OF BIRTH:  October 12, 1933  DATE OF PROCEDURE:  08/24/2010 DATE OF DISCHARGE:                              OPERATIVE REPORT   BRIEF HISTORY:  The patient is a 77 year old who has suffered from back and leg pain consistent with neurogenic claudication.  He has failed medical management and worked up with MRI which demonstrated spinal stenosis at L3-L4 and L4-5.  I discussed various treatment options of surgery.  He has weighed the risks, benefits, and alternatives of surgery and decided to proceed with L3-L4 laminectomy.  PREOPERATIVE DIAGNOSES:  L3-4 and L4-5 spinal stenosis, lumbago, lumbar radiculopathy.  POSTOPERATIVE DIAGNOSIS:  L3-4 and L4-5 spinal stenosis, lumbago, lumbar radiculopathy.  PROCEDURES:  L4 laminectomy with bilateral L3 laminotomies to decompress bilateral L3, L4, and L5 nerve roots using a microdissection.  SURGEON:  Cristi Loron, MD  ASSISTANT:  Hilda Lias, MD  ANESTHESIA:  General endotracheal.  ESTIMATED BLOOD LOSS:  50 mL.  SPECIMENS:  None.  DRAINS:  None.  COMPLICATIONS:  None.  DESCRIPTION OF PROCEDURE:  The patient was brought to the operating room by the anesthesia team.  General endotracheal anesthesia was induced. The patient was turned to the prone position on the Wilson frame.  His lumbosacral region was then prepared with Betadine scrub and Betadine solution.  Sterile drapes were applied and then injected the area to be incised with Marcaine with epinephrine solution.  A scalpel to make a linear midline shift over the L3-4 and L4-5 interspaces.  I used electrocautery to perform a bilateral subperiosteal dissection exposing spinous processes of lamina of L3 and L4.  We obtained intraoperative radiograph to confirm location and then  inserted the [facility] retractor for exposure.  I began decompression by incising the interspinous ligament at L3-4 and L4-5 with the scalpel.  I used Leksell rongeur to remove the L4 spinous process.  I then used high-speed drill to perform bilateral L3 and L4 laminotomies.  I completed the laminectomy at L4 and widened laminotomies at L3 using Kerrison punch.  We have also used the Kerrison punch to remove the L3-4 and L4-5 ligamentum flavum.  We then brought the operative microscope into the field and under its magnification illumination, we completed the microdissection/decompression.  I used microdissection to free up the thecal sac and nerve roots from the epidural tissue and I then used Kerrison punch to perform foraminotomies about the bilateral L3, L4, and L5 nerve roots.  We then inspected the intervertebral disk at L3-4 and L4-5 and noted there was no disk herniations.  We then palpated along the ventral surface of the thecal sac along the exit route of the L3, L4, and L5 nerve roots and then noted that neural structures were well decompressed.  Then obtained hemostasis using bipolar cautery.  We irrigated the wound out with bacitracin solution.  We then removed the retractor and reapproximated the patient's thoracolumbar fascia with interrupted #1 Vicryl suture, subcutaneous with interrupted 2-0 Vicryl suture and the skin with Steri-Strips and  Benzoin.  The wound was then coated with bacitracin ointment.  Sterile dressing was applied.  The drapes were removed and the patient was subsequently returned to supine position where he was extubated by the anesthesia team and transported to the post anesthesia care unit in stable condition.  All sponge, instrument and needle counts were correct at the end of this case.     Cristi Loron, M.D.     JDJ/MEDQ  D:  08/24/2010  T:  08/25/2010  Job:  [id number]  Electronically Signed by Tressie Stalker M.D. on 08/25/2010  08:00:26 AM

## 2010-08-26 ENCOUNTER — Inpatient Hospital Stay (HOSPITAL_COMMUNITY): Payer: Medicare Other

## 2010-09-14 ENCOUNTER — Emergency Department (HOSPITAL_COMMUNITY): Payer: Medicare Other

## 2010-09-14 ENCOUNTER — Emergency Department (HOSPITAL_COMMUNITY)
Admission: EM | Admit: 2010-09-14 | Discharge: 2010-09-14 | Disposition: A | Discharge status: Home / Self Care | Payer: Medicare Other | Attending: Emergency Medicine | Admitting: Emergency Medicine

## 2010-09-14 DIAGNOSIS — R339 Retention of urine, unspecified: Secondary | ICD-10-CM | POA: Insufficient documentation

## 2010-09-14 DIAGNOSIS — M549 Dorsalgia, unspecified: Secondary | ICD-10-CM | POA: Insufficient documentation

## 2010-09-14 DIAGNOSIS — I10 Essential (primary) hypertension: Secondary | ICD-10-CM | POA: Insufficient documentation

## 2010-09-14 DIAGNOSIS — N400 Enlarged prostate without lower urinary tract symptoms: Secondary | ICD-10-CM | POA: Insufficient documentation

## 2010-09-14 DIAGNOSIS — K5900 Constipation, unspecified: Secondary | ICD-10-CM | POA: Insufficient documentation

## 2010-09-14 DIAGNOSIS — G8929 Other chronic pain: Secondary | ICD-10-CM | POA: Insufficient documentation

## 2010-09-14 DIAGNOSIS — R262 Difficulty in walking, not elsewhere classified: Secondary | ICD-10-CM | POA: Insufficient documentation

## 2010-09-14 LAB — OCCULT BLOOD, POC DEVICE: Fecal Occult Bld: NEGATIVE

## 2010-09-14 LAB — URINALYSIS, ROUTINE W REFLEX MICROSCOPIC
Glucose, UA: NEGATIVE mg/dL
Hgb urine dipstick: NEGATIVE
Leukocytes, UA: NEGATIVE
Protein, ur: NEGATIVE mg/dL
Specific Gravity, Urine: 1.01 (ref 1.005–1.030)

## 2010-09-14 LAB — POCT I-STAT, CHEM 8
BUN: 15 mg/dL (ref 6–23)
Hemoglobin: 15.3 g/dL (ref 13.0–17.0)
Potassium: 3.8 mEq/L (ref 3.5–5.1)
Sodium: 139 mEq/L (ref 135–145)
TCO2: 22 mmol/L (ref 0–100)

## 2010-09-14 MED ORDER — GADOBENATE DIMEGLUMINE 529 MG/ML IV SOLN
20.0000 mL | Freq: Once | INTRAVENOUS | Status: AC | PRN
  Administered 2010-09-14: 20 mL via INTRAVENOUS

## 2010-09-15 LAB — URINE CULTURE

## 2010-09-27 NOTE — Discharge Summary (Signed)
  NAMEBRAYDN, Ricky Bradley NO.:  [id number]  MEDICAL RECORD NO.:  [id number]  LOCATION:  3535                         FACILITY:  [facility]  PHYSICIAN:  Cristi Loron, M.D.DATE OF BIRTH:  03/01/1933  DATE OF ADMISSION:  08/24/2010 DATE OF DISCHARGE:  08/27/2010                              DISCHARGE SUMMARY   BRIEF HISTORY:  The patient is a 77 year old male, who has suffered a back and leg pain consistent with neurogenic claudication.  He has failed medical management and was worked up with a lumbar MRI which demonstrates spinal stenosis at L3-4 and L4-5.  I discussed various treatment options with him including surgery.  The patient has weighed the risks, benefits, and alternatives of surgery and decided to proceed with an L3 and L4 laminectomy.  For further details of this admission, please refer to the typed history and physical.  HOSPITAL COURSE:  Admitted the patient to [facility] on XLKG40, 2012.  On the day of admission, I performed a L4 laminectomy with bilateral L3 laminotomies.  This surgery went well (for full details of this operation, please refer to the typed operative note).  POSTOPERATIVE COURSE:  The patient's postoperative course was remarkable only for urinary retention.  He was seen by the urologist, Dr. McDiarmid, he recommended the following for several days.  The patient's Foley was discontinued on August 26, 2010, and he subsequently urinated normally.  The remainder of the patient's hospital course was unremarkable.  He was discharged home on August 27, 2010.  DISCHARGE INSTRUCTIONS:  The patient was given written discharge instructions.  He was instructed to follow up with me in 4 weeks.  FINAL DIAGNOSES:  L3-4 and L4-5 spinal stenosis, lumbago, lumbar radiculopathy, urinary retention.  PROCEDURE PERFORMED: 1. L4 laminectomy with bilateral L3 laminotomies. 2. Decompress the bilateral L3, L4, and L5 nerve roots using  microdissection.     Cristi Loron, M.D.     JDJ/MEDQ  D:  09/26/2010  T:  09/26/2010  Job:  [id number]  Electronically Signed by Tressie Stalker M.D. on 09/27/2010 07:32:58 PM

## 2010-10-06 ENCOUNTER — Emergency Department (HOSPITAL_COMMUNITY)
Admission: EM | Admit: 2010-10-06 | Discharge: 2010-10-06 | Disposition: A | Discharge status: Home / Self Care | Payer: Medicare Other | Attending: Emergency Medicine | Admitting: Emergency Medicine

## 2010-10-06 ENCOUNTER — Emergency Department (HOSPITAL_COMMUNITY): Payer: Medicare Other

## 2010-10-06 DIAGNOSIS — K8020 Calculus of gallbladder without cholecystitis without obstruction: Secondary | ICD-10-CM | POA: Insufficient documentation

## 2010-10-06 DIAGNOSIS — Z9889 Other specified postprocedural states: Secondary | ICD-10-CM | POA: Insufficient documentation

## 2010-10-06 DIAGNOSIS — Z7982 Long term (current) use of aspirin: Secondary | ICD-10-CM | POA: Insufficient documentation

## 2010-10-06 DIAGNOSIS — R1031 Right lower quadrant pain: Secondary | ICD-10-CM | POA: Insufficient documentation

## 2010-10-06 DIAGNOSIS — Z79899 Other long term (current) drug therapy: Secondary | ICD-10-CM | POA: Insufficient documentation

## 2010-10-06 DIAGNOSIS — K219 Gastro-esophageal reflux disease without esophagitis: Secondary | ICD-10-CM | POA: Insufficient documentation

## 2010-10-06 DIAGNOSIS — I10 Essential (primary) hypertension: Secondary | ICD-10-CM | POA: Insufficient documentation

## 2010-10-06 LAB — COMPREHENSIVE METABOLIC PANEL
AST: 13 U/L (ref 0–37)
Albumin: 3.9 g/dL (ref 3.5–5.2)
Alkaline Phosphatase: 87 U/L (ref 39–117)
Chloride: 105 mEq/L (ref 96–112)
Potassium: 4.1 mEq/L (ref 3.5–5.1)
Total Bilirubin: 0.4 mg/dL (ref 0.3–1.2)
Total Protein: 7.2 g/dL (ref 6.0–8.3)

## 2010-10-06 LAB — CBC
MCHC: 35.5 g/dL (ref 30.0–36.0)
Platelets: 207 10*3/uL (ref 150–400)
RDW: 12.9 % (ref 11.5–15.5)
WBC: 8 10*3/uL (ref 4.0–10.5)

## 2010-10-06 LAB — URINALYSIS, ROUTINE W REFLEX MICROSCOPIC
Ketones, ur: NEGATIVE mg/dL
Leukocytes, UA: NEGATIVE
Nitrite: NEGATIVE
Protein, ur: NEGATIVE mg/dL
Specific Gravity, Urine: 1.018 (ref 1.005–1.030)
Urobilinogen, UA: 0.2 mg/dL (ref 0.0–1.0)
pH: 5.5 (ref 5.0–8.0)

## 2010-10-06 LAB — DIFFERENTIAL
Basophils Absolute: 0 10*3/uL (ref 0.0–0.1)
Basophils Relative: 1 % (ref 0–1)
Eosinophils Absolute: 0.2 10*3/uL (ref 0.0–0.7)
Eosinophils Relative: 3 % (ref 0–5)
Monocytes Absolute: 0.9 10*3/uL (ref 0.1–1.0)

## 2010-10-06 MED ORDER — IOHEXOL 300 MG/ML  SOLN
100.0000 mL | Freq: Once | INTRAMUSCULAR | Status: AC | PRN
  Administered 2010-10-06: 100 mL via INTRAVENOUS

## 2010-10-18 ENCOUNTER — Ambulatory Visit (INDEPENDENT_AMBULATORY_CARE_PROVIDER_SITE_OTHER): Payer: Medicare Other | Admitting: General Surgery

## 2010-10-18 ENCOUNTER — Encounter (INDEPENDENT_AMBULATORY_CARE_PROVIDER_SITE_OTHER): Payer: Self-pay | Admitting: General Surgery

## 2010-10-18 VITALS — BP 132/86 | HR 90 | Temp 97.5°F | Ht 72.0 in | Wt 194.8 lb

## 2010-10-18 DIAGNOSIS — K8020 Calculus of gallbladder without cholecystitis without obstruction: Secondary | ICD-10-CM

## 2010-10-18 NOTE — Progress Notes (Signed)
Chief Complaint  Patient presents with  . Other    Symptomatic gallstones from Dr. Ewing Schlein.     HPI Ricky Bradley is a 77 y.o. male.  This patient was referred for evaluation of symptomatic cholelithiasis and right-sided abdominal pain. He has had multiple episodes of right-sided abdominal pain and bloating dating back to the beginning of July. He had an episode beginning of July with bloating and abdominal pain which led him to the emergency room for evaluation. At that time, he was diagnosed with constipation and was discharged. A few days later he noted having back surgery as previously scheduled and even while recovering in the hospital was having persistent right-sided abdominal pain which he felt was due to constipation. He had repeated enemas and had some relief with enemas but continued to have symptoms without relief after repeated treatment. He states that he always feels bloated and has mild abdominal pain with episodes of increasing pain located mainly on the right side and across his midabdomen. He has associated nausea but no vomiting and he describes the pain as "pressure" and denies any radiation of the pain. He's had weight loss due to a fear of eating. He was seen most recently in the emergency room on August 23 where a CT scan was performed and demonstrated a gallstone and he was found and referred for surgical evaluation. He states that his symptoms are exacerbated with greasy food. He takes Nexium for occasional heartburn symptoms and he states that since he's been taking his Nexium he has had improvement in his heartburn but has not improved his right-sided abdominal pain. He has baseline constipation and currently is on MiraLax and stool softeners but again this did not provide relief of his abdominal pain. He is current on this colonoscopy and last colonoscopy was found to have diverticulosis and polyps. He denies any blood in the stool or melena. HPI  Past Medical History  Diagnosis  Date  . Hypertension   . GERD (gastroesophageal reflux disease)   . High cholesterol     Past Surgical History  Procedure Date  . Joint replacement 2001  and 2002    both knees  . Laminectomy 08/2010    History reviewed. No pertinent family history.  Social History History  Substance Use Topics  . Smoking status: Former Games developer  . Smokeless tobacco: Not on file  . Alcohol Use: No    Allergies  Allergen Reactions  . Vicodin (Hydrocodone-Acetaminophen) Hives    On neck and chest.    Current Outpatient Prescriptions  Medication Sig Dispense Refill  . amLODipine (NORVASC) 5 MG tablet Take 2.5 mg by mouth daily.        Marland Kitchen aspirin 81 MG EC tablet Take 81 mg by mouth daily. Stop taking before surgery      . bethanechol (URECHOLINE) 25 MG tablet Take 25 mg by mouth 2 (two) times daily.        . Cetirizine HCl (ZYRTEC PO) Take by mouth daily.        Marland Kitchen lovastatin (MEVACOR) 40 MG tablet Take 40 mg by mouth at bedtime.        Marland Kitchen omeprazole (PRILOSEC) 40 MG capsule Take 40 mg by mouth daily.        . Tamsulosin HCl (FLOMAX) 0.4 MG CAPS Take 0.4 mg by mouth 2 (two) times daily.       Marland Kitchen OVER THE COUNTER MEDICATION Take 1 tablet by mouth daily. Muscadine grape tablets       .  traMADol (ULTRAM) 50 MG tablet Take 50-100 mg by mouth every 6 (six) hours as needed. For pain       . vitamin E 400 UNIT capsule Take 400 Units by mouth daily.          Review of Systems Review of Systems  All other systems reviewed and are negative.    Blood pressure 132/86, pulse 90, temperature 97.5 F (36.4 C), temperature source Temporal, height 6' (1.829 m), weight 194 lb 12.8 oz (88.361 kg).  Physical Exam Physical Exam  Vitals reviewed. Constitutional: He is oriented to person, place, and time. He appears well-developed and well-nourished. No distress.  HENT:  Head: Normocephalic and atraumatic.  Eyes: Conjunctivae and EOM are normal. Pupils are equal, round, and reactive to light. Right eye  exhibits no discharge. Left eye exhibits no discharge. No scleral icterus.  Neck: Normal range of motion. Neck supple. No tracheal deviation present.  Cardiovascular: Normal rate, regular rhythm and normal heart sounds.   Pulmonary/Chest: Breath sounds normal. No stridor. No respiratory distress. He has no wheezes.  Abdominal: Soft. Bowel sounds are normal. He exhibits no distension and no mass. There is tenderness. There is no rebound and no guarding.       Mild RUQ tenderness, no Murphy's, no peritonitis  Musculoskeletal: Normal range of motion. He exhibits no edema.  Neurological: He is alert and oriented to person, place, and time.  Skin: Skin is warm and dry. No rash noted. He is not diaphoretic. No erythema. No pallor.  Psychiatric: He has a normal mood and affect. His behavior is normal. Judgment and thought content normal.    Data Reviewed CT  Assessment    Abdominal pain and cholelithiasis. I think that his symptoms could certainly be related to his cholelithiasis. He has right-sided abdominal pain nausea and gallstones found on CT scan. Though his symptoms could be due to reflux, constipation, or other GI cause, given the fact that he has known gallstones and symptoms consistent with cholelithiasis I think that he would benefit from cholecystectomy. I discussed with him the option for cholecystectomy versus continued observation and since he is having so much discomfort and frequent discomfort he is interested in cholecystectomy. I discussed with him the risks of infection, bleeding, pain, scarring, persistent symptoms, injury to bowel or bile ducts, and diarrhea and the need for open surgery and he expressed understanding and desires to proceed with cholecystectomy as soon as available. He seems to have fairly good exercise tolerance other than his recent back surgery and given the fact that he recently tolerated back surgery without any difficulty he should be able to tolerate  cholecystectomy as well.Again, I did explain that his symptoms could be due to other causes as well as cholelithiasis and he expressed understanding and desires to proceed with cholecystectomy.    Plan    We will plan for cholecystectomy as soon as available.       Lodema Pilot DAVID 10/18/2010, 3:46 PM

## 2010-10-28 ENCOUNTER — Encounter (HOSPITAL_COMMUNITY)
Admission: RE | Admit: 2010-10-28 | Discharge: 2010-10-28 | Disposition: A | Discharge status: Home / Self Care | Payer: Medicare Other | Source: Ambulatory Visit | Attending: General Surgery | Admitting: General Surgery

## 2010-10-28 LAB — CBC
HCT: 42.8 % (ref 39.0–52.0)
MCH: 29.4 pg (ref 26.0–34.0)
MCV: 83.3 fL (ref 78.0–100.0)
Platelets: 250 10*3/uL (ref 150–400)
RBC: 5.14 MIL/uL (ref 4.22–5.81)
RDW: 13 % (ref 11.5–15.5)
WBC: 9.7 10*3/uL (ref 4.0–10.5)

## 2010-10-28 LAB — BASIC METABOLIC PANEL
BUN: 21 mg/dL (ref 6–23)
CO2: 28 mEq/L (ref 19–32)
Calcium: 10.3 mg/dL (ref 8.4–10.5)
Chloride: 104 mEq/L (ref 96–112)
Creatinine, Ser: 1.02 mg/dL (ref 0.50–1.35)

## 2010-10-28 LAB — SURGICAL PCR SCREEN: MRSA, PCR: NEGATIVE

## 2010-11-01 ENCOUNTER — Ambulatory Visit (HOSPITAL_COMMUNITY)
Admission: RE | Admit: 2010-11-01 | Discharge: 2010-11-03 | Disposition: A | Discharge status: Home / Self Care | Payer: Medicare Other | Source: Ambulatory Visit | Attending: General Surgery | Admitting: General Surgery

## 2010-11-01 ENCOUNTER — Ambulatory Visit (HOSPITAL_COMMUNITY): Payer: Medicare Other

## 2010-11-01 ENCOUNTER — Other Ambulatory Visit (INDEPENDENT_AMBULATORY_CARE_PROVIDER_SITE_OTHER): Payer: Self-pay | Admitting: General Surgery

## 2010-11-01 DIAGNOSIS — J4489 Other specified chronic obstructive pulmonary disease: Secondary | ICD-10-CM | POA: Insufficient documentation

## 2010-11-01 DIAGNOSIS — K811 Chronic cholecystitis: Secondary | ICD-10-CM | POA: Insufficient documentation

## 2010-11-01 DIAGNOSIS — Y838 Other surgical procedures as the cause of abnormal reaction of the patient, or of later complication, without mention of misadventure at the time of the procedure: Secondary | ICD-10-CM | POA: Insufficient documentation

## 2010-11-01 DIAGNOSIS — I10 Essential (primary) hypertension: Secondary | ICD-10-CM | POA: Insufficient documentation

## 2010-11-01 DIAGNOSIS — R339 Retention of urine, unspecified: Secondary | ICD-10-CM | POA: Insufficient documentation

## 2010-11-01 DIAGNOSIS — J449 Chronic obstructive pulmonary disease, unspecified: Secondary | ICD-10-CM | POA: Insufficient documentation

## 2010-11-01 DIAGNOSIS — Z01812 Encounter for preprocedural laboratory examination: Secondary | ICD-10-CM | POA: Insufficient documentation

## 2010-11-01 DIAGNOSIS — IMO0002 Reserved for concepts with insufficient information to code with codable children: Secondary | ICD-10-CM | POA: Insufficient documentation

## 2010-11-01 DIAGNOSIS — N9989 Other postprocedural complications and disorders of genitourinary system: Secondary | ICD-10-CM | POA: Insufficient documentation

## 2010-11-01 DIAGNOSIS — Y9210 Unspecified residential institution as the place of occurrence of the external cause: Secondary | ICD-10-CM | POA: Insufficient documentation

## 2010-11-01 DIAGNOSIS — R000 Tachycardia, unspecified: Secondary | ICD-10-CM | POA: Insufficient documentation

## 2010-11-01 DIAGNOSIS — K219 Gastro-esophageal reflux disease without esophagitis: Secondary | ICD-10-CM | POA: Insufficient documentation

## 2010-11-01 HISTORY — PX: CHOLECYSTECTOMY: SHX55

## 2010-11-02 ENCOUNTER — Ambulatory Visit (HOSPITAL_COMMUNITY): Payer: Medicare Other

## 2010-11-02 DIAGNOSIS — I472 Ventricular tachycardia: Secondary | ICD-10-CM

## 2010-11-02 LAB — T4, FREE: Free T4: 1.29 ng/dL (ref 0.80–1.80)

## 2010-11-02 LAB — BASIC METABOLIC PANEL
BUN: 10 mg/dL (ref 6–23)
CO2: 26 mEq/L (ref 19–32)
Chloride: 105 mEq/L (ref 96–112)
Creatinine, Ser: 0.86 mg/dL (ref 0.50–1.35)
GFR calc Af Amer: >60 mL/min (ref >60)
Glucose, Bld: 140 mg/dL — ABNORMAL HIGH (ref 70–99)
Potassium: 3.2 mEq/L — ABNORMAL LOW (ref 3.5–5.1)

## 2010-11-02 LAB — CARDIAC PANEL(CRET KIN+CKTOT+MB+TROPI)
CK, MB: 6 ng/mL — ABNORMAL HIGH (ref 0.3–4.0)
CK, MB: 4.3 ng/mL — ABNORMAL HIGH (ref 0.3–4.0)
Relative Index: 0.8 (ref 0.0–2.5)
Total CK: 619 U/L — ABNORMAL HIGH (ref 7–232)
Troponin I: <0.3 ng/mL (ref <0.30)
Troponin I: <0.3 ng/mL (ref <0.30)

## 2010-11-02 LAB — HEMOGLOBIN A1C: Mean Plasma Glucose: 131 mg/dL — ABNORMAL HIGH (ref <117)

## 2010-11-02 LAB — TSH: TSH: 0.47 u[IU]/mL (ref 0.350–4.500)

## 2010-11-02 MED ORDER — IOHEXOL 350 MG/ML SOLN: 100.0000 mL | Freq: Once | INTRAVENOUS | Status: AC | PRN

## 2010-11-02 NOTE — Op Note (Signed)
  NAMEKYZEN, HORN NO.:  [id number]  MEDICAL RECORD NO.:  [id number]  LOCATION:  3737                         FACILITY:  [facility]  PHYSICIAN:  Veasna Santibanez I. Patsi Sears, M.D.DATE OF BIRTH:  07-11-1933  DATE OF PROCEDURE:  11/02/2010 DATE OF DISCHARGE:                              OPERATIVE REPORT   PREOPERATIVE DIAGNOSIS:  Acute urinary retention with multiple failed Foley catheter attempts.  POSTOPERATIVE DIAGNOSIS:  Acute urinary retention with multiple failed Foley catheter attempts.  OPERATIONS:  Flexible cystoscopy and Foley catheterization.  SURGEON:  Kratos Ruscitti I. Patsi Sears, MD  ANESTHESIA:  Local Xylocaine.  PREPARATION:  With the patient in supine position, the penis was prepped with Betadine solution and draped in the usual fashion.  PROCEDURE:  Attempt at 14 Foley catheterization failed, and repeat attempt with Coude catheter failed.  These attempts were accomplished while awaiting completion of the urology cart from the operating room.  Once complete, the patient underwent flexible cystoscopy, by placing the cystoscope in the bladder, and under direct vision, I was able to ascertain that there was a tract through the prostate, subtrigonal.  I was able to manipulate the cystoscope over this tract, into the true bladder, and guidewire was placed in the bladder.  A 16-French Council catheter was then placed with no traction.  Clear urine was obtained approximately 600 mL.  The patient tolerated the procedure well.  He will be advised to be put on antibiotic, will be seen Dr. Retta Diones next week.     Shontell Prosser I. Patsi Sears, M.D.     SIT/MEDQ  D:  11/02/2010  T:  11/02/2010  Job:  [id number]  cc:   Bertram Millard. Dahlstedt, M.D.  Electronically Signed by Jethro Bolus M.D. on 11/02/2010 11:37:18 AM

## 2010-11-03 DIAGNOSIS — I517 Cardiomegaly: Secondary | ICD-10-CM

## 2010-11-03 LAB — BASIC METABOLIC PANEL
Chloride: 109 mEq/L (ref 96–112)
GFR calc Af Amer: >60 mL/min (ref >60)
GFR calc non Af Amer: >60 mL/min (ref >60)
Potassium: 4 mEq/L (ref 3.5–5.1)
Sodium: 142 mEq/L (ref 135–145)

## 2010-11-03 LAB — CARDIAC PANEL(CRET KIN+CKTOT+MB+TROPI)
Relative Index: 0.7 (ref 0.0–2.5)
Total CK: 520 U/L — ABNORMAL HIGH (ref 7–232)
Troponin I: <0.3 ng/mL (ref <0.30)

## 2010-11-03 LAB — CBC
HCT: 40.4 % (ref 39.0–52.0)
Platelets: 185 10*3/uL (ref 150–400)
RDW: 13.3 % (ref 11.5–15.5)
WBC: 9.6 10*3/uL (ref 4.0–10.5)

## 2010-11-04 NOTE — Op Note (Signed)
Ricky Bradley, OLD NO.:  [id number]  MEDICAL RECORD NO.:  [id number]  LOCATION:  SDSC                         FACILITY:  [facility]  PHYSICIAN:  Lodema Pilot, MD       DATE OF BIRTH:  1933-06-03  DATE OF PROCEDURE:  11/01/2010 DATE OF DISCHARGE:                              OPERATIVE REPORT   PROCEDURE:  Laparoscopic cholecystectomy with intraoperative cholangiogram.  PREOPERATIVE DIAGNOSIS:  Symptomatic cholelithiasis.  POSTOPERATIVE DIAGNOSIS:  Symptomatic cholelithiasis.  SURGEON:  Lodema Pilot, MD  ASSISTANT:  Dr. Daphine Deutscher.  ANESTHESIA:  General endotracheal tube anesthesia with 29 mL of lidocaine with epinephrine and 0.25% Marcaine in a 50/50 mixture.  FLUID:  2000 mL of crystalloid.  ESTIMATED BLOOD LOSS:  Minimal.  DRAINS:  None.  SPECIMENS:  Gallbladder and contents sent to pathology for permanent sectioning.  COMPLICATIONS:  None apparent.  FINDINGS:  Normal intraoperative cholangiogram with long cystic duct and no filling defects and free flow bowel into the duodenum.  INDICATIONS FOR PROCEDURE:  Mr. Kinney is a 77 year old male with persistent right upper quadrant and right-sided abdominal pain who has been seen in the emergency room for evaluation of this and he had a CT scan which demonstrated gallstones within the gallbladder, although there was probable tear.  DETAILS:  Mr. Burcher was seen and evaluated in the preoperative area and risks and benefits of procedure were discussed in lay terms.  Informed consent was obtained and he was taken to the operating room, placed on table in supine position and general endotracheal tube anesthesia was obtained.  Prophylactic antibiotics were given and the procedure time- out was performed with all operative team members to confirm proper patient and procedure and the abdomen was prepped and draped in a standard surgical fashion and a semicircular infraumbilical incision was made in the skin and  dissection carried down through the subcutaneous tissue using the Bovie electrocautery and blunt dissection.  The umbilical fascia was elevated and sharply incised and the peritoneum was accessed under direct visualization.  A 12 mm Hasson trocar was placed and pneumoperitoneum was obtained and laparoscope was introduced and the underlying bowel was inspected for injury upon entry and none was identified, and an 11-mm epigastric trocar was placed and two 5 mm right upper quadrant trocars were placed under direct visualization.  The gallbladder was then retracted cephalad and he had some thick omental adhesions to the gallbladder which were taken down using sharp dissection.  No cautery was used during this part of the case.  The gallbladder was then easily identified and triangle of Calot was skeletonized and the cystic duct was identified.  The gallbladder wall was thin and made a small tear in the gallbladder wall, it got some bile, but I did not see any of evidence of any gallstone.  The cystic duct was easily skeletonized and critical view safety was obtained visualizing a single cystic duct entering the gallbladder and liver parenchyma through the triangle of Calot, the clip was placed on the gallbladder side and a small cystic ductotomy was made and the cholangiogram catheter was passed into the abdomen through a 14-gauge angiocatheter and into the cystic duct,  it was clipped into place and cholangiogram was performed which demonstrated a very long cystic duct with visualization of the right and left hepatic ducts and free flow of bile into the duodenum with no common bile duct filling defects.  The catheter was removed and two clips were placed on the cystic duct, however, these were closed to completely transect fat coming across the cystic duct, so I also placed a zero PDS Endoloop underneath these clips to ensure good ligation of the cystic duct and then the gallbladder  was further elevated from the gallbladder fossa using Bovie electrocautery. The small cystic artery was seen coursing up onto the gallbladder and this was skeletonized and clipped between hemoclips and then the gallbladder was free and mobile and removed from the gallbladder fossa using Bovie electrocautery.  It was removed from the abdomen through the umbilical trocar site, inspected on the back table and there were no evidence of any gallstones, but he had a single cystic duct identified. The gallbladder fossa was inspected for hemostasis which was noted to be adequate and 2 L of saline were used to irrigate the right upper quadrant done and irrigation returned clear.  There was some spillage of bile through the tear in the gallbladder and this was again irrigated out the abdomen and irrigation was clear.  The right upper quadrant trocar sites were removed under direct visualization and the umbilical trocar was removed.  Umbilical fascia was approximated with interrupted 0 Vicryl sutures in open fashion and then these were secured.  The abdomen was then reinsufflated through the epigastric trocar and the abdominal wall closure was noted to be adequate with no evidence of bowel injury.  There right upper quadrant was noted to be hemostatic and there was no evidence of bowel injury and the abdominal wall was noted be hemostatic.  The epigastric trocar was removed and the skin edges were anesthetized with 29 mL of 1% lidocaine with epinephrine and 0.25% Marcaine in a 50/50 mixture.  Skin edges were approximated with 4-0 Monocryl subcuticular suture and the skin was washed and dried and Dermabond was applied.  All sponge and needle and instrument counts were correct at the end of the case.  The patient tolerated the procedure well and there were no apparent complications.          ______________________________ Lodema Pilot, MD     BL/MEDQ  D:  11/01/2010  T:  11/01/2010  Job:   [id number]  Electronically Signed by Lodema Pilot DO on 11/04/2010 09:53:56 AM

## 2010-11-06 ENCOUNTER — Encounter (HOSPITAL_BASED_OUTPATIENT_CLINIC_OR_DEPARTMENT_OTHER): Payer: Self-pay | Admitting: *Deleted

## 2010-11-06 ENCOUNTER — Emergency Department (HOSPITAL_BASED_OUTPATIENT_CLINIC_OR_DEPARTMENT_OTHER)
Admission: EM | Admit: 2010-11-06 | Discharge: 2010-11-06 | Disposition: A | Discharge status: Home / Self Care | Payer: Medicare Other | Attending: Emergency Medicine | Admitting: Emergency Medicine

## 2010-11-06 DIAGNOSIS — K219 Gastro-esophageal reflux disease without esophagitis: Secondary | ICD-10-CM | POA: Insufficient documentation

## 2010-11-06 DIAGNOSIS — T8389XA Other specified complication of genitourinary prosthetic devices, implants and grafts, initial encounter: Secondary | ICD-10-CM | POA: Insufficient documentation

## 2010-11-06 DIAGNOSIS — E7800 Pure hypercholesterolemia, unspecified: Secondary | ICD-10-CM | POA: Insufficient documentation

## 2010-11-06 DIAGNOSIS — Y92009 Unspecified place in unspecified non-institutional (private) residence as the place of occurrence of the external cause: Secondary | ICD-10-CM | POA: Insufficient documentation

## 2010-11-06 DIAGNOSIS — I10 Essential (primary) hypertension: Secondary | ICD-10-CM | POA: Insufficient documentation

## 2010-11-06 DIAGNOSIS — T839XXA Unspecified complication of genitourinary prosthetic device, implant and graft, initial encounter: Secondary | ICD-10-CM

## 2010-11-06 DIAGNOSIS — R339 Retention of urine, unspecified: Secondary | ICD-10-CM | POA: Insufficient documentation

## 2010-11-06 DIAGNOSIS — Y846 Urinary catheterization as the cause of abnormal reaction of the patient, or of later complication, without mention of misadventure at the time of the procedure: Secondary | ICD-10-CM | POA: Insufficient documentation

## 2010-11-06 DIAGNOSIS — Z79899 Other long term (current) drug therapy: Secondary | ICD-10-CM | POA: Insufficient documentation

## 2010-11-06 LAB — BASIC METABOLIC PANEL
BUN: 17 mg/dL (ref 6–23)
Calcium: 9.6 mg/dL (ref 8.4–10.5)
Creatinine, Ser: 1 mg/dL (ref 0.50–1.35)
GFR calc Af Amer: >60 mL/min (ref >60)

## 2010-11-06 LAB — URINE MICROSCOPIC-ADD ON

## 2010-11-06 LAB — URINALYSIS, ROUTINE W REFLEX MICROSCOPIC
Bilirubin Urine: NEGATIVE
Glucose, UA: NEGATIVE mg/dL
Ketones, ur: NEGATIVE mg/dL
Leukocytes, UA: NEGATIVE
Nitrite: NEGATIVE
Protein, ur: NEGATIVE mg/dL

## 2010-11-06 MED ORDER — OXYCODONE-ACETAMINOPHEN 5-325 MG PO TABS
ORAL_TABLET | ORAL | Status: AC
  Administered 2010-11-06: 1
  Filled 2010-11-06: qty 1

## 2010-11-06 NOTE — ED Notes (Signed)
Patient is resting comfortably.Reports "feeling much Better"

## 2010-11-06 NOTE — ED Provider Notes (Signed)
History     CSN: [id number] Arrival date & time: 11/06/2010  4:20 AM  Chief Complaint  Patient presents with  . Urinary Retention    HPI  (Consider location/radiation/quality/duration/timing/severity/associated sxs/prior treatment)  HPI  patient had a Foley catheter placed on September 18 following a surgical procedure. Patient has had problems with urinary retention in the past. Patient had his catheter removed and another one was replaced since that time. Patient woke up this morning feeling like it has not been draining as it should. The Foley catheter bag does have clear urine output. Patient is not having much pain. There's no fever or. No vomiting.  Past Medical History  Diagnosis Date  . Hypertension   . GERD (gastroesophageal reflux disease)   . High cholesterol     Past Surgical History  Procedure Date  . Joint replacement 2001  and 2002    both knees  . Laminectomy 08/2010  . Cholecystectomy     History reviewed. No pertinent family history.  History  Substance Use Topics  . Smoking status: Former Games developer  . Smokeless tobacco: Not on file  . Alcohol Use: No      Review of Systems  Review of Systems  Constitutional: Negative for fever.  Gastrointestinal: Negative for abdominal distention.  Genitourinary: Negative for penile pain.  All other systems reviewed and are negative.    Allergies  Vicodin  Home Medications   Current Outpatient Rx  Name Route Sig Dispense Refill  . CIPROFLOXACIN HCL 500 MG PO TABS Oral Take 500 mg by mouth 2 (two) times daily.      Marland Kitchen METOPROLOL SUCCINATE 25 MG PO TB24 Oral Take 12.5 mg by mouth daily.      Marland Kitchen AMLODIPINE BESYLATE 5 MG PO TABS Oral Take 2.5 mg by mouth daily.      . ASPIRIN 81 MG PO TBEC Oral Take 81 mg by mouth daily. Stop taking before surgery    . BETHANECHOL CHLORIDE 25 MG PO TABS Oral Take 25 mg by mouth 2 (two) times daily.      Marland Kitchen ZYRTEC PO Oral Take by mouth daily.      Marland Kitchen LOVASTATIN 40 MG PO TABS Oral  Take 40 mg by mouth at bedtime.      . OMEPRAZOLE 40 MG PO CPDR Oral Take 40 mg by mouth daily.      Marland Kitchen OVER THE COUNTER MEDICATION Oral Take 1 tablet by mouth daily. [place] grape tablets     . TAMSULOSIN HCL 0.4 MG PO CAPS Oral Take 0.4 mg by mouth 2 (two) times daily.     . TRAMADOL HCL 50 MG PO TABS Oral Take 50-100 mg by mouth every 6 (six) hours as needed. For pain     . VITAMIN E 400 UNITS PO CAPS Oral Take 400 Units by mouth daily.        Physical Exam    BP 101/70  Pulse 94  Temp(Src) 98.1 F (36.7 C) (Oral)  Resp 16  SpO2 98%  Physical Exam  Nursing note and vitals reviewed. Constitutional: He appears well-developed and well-nourished. No distress.  HENT:  Head: Normocephalic and atraumatic.  Right Ear: External ear normal.  Left Ear: External ear normal.  Eyes: Conjunctivae are normal. Right eye exhibits no discharge. Left eye exhibits no discharge. No scleral icterus.  Neck: Neck supple. No tracheal deviation present.  Cardiovascular: Normal rate, regular rhythm and intact distal pulses.   Pulmonary/Chest: Effort normal and breath sounds normal. No stridor. No  respiratory distress. He has no wheezes. He has no rales.  Abdominal: Soft. Bowel sounds are normal. He exhibits no distension. There is no tenderness. There is no rebound and no guarding.  Genitourinary:       Normal external genitalia, Foley catheter with leg bag in place  Musculoskeletal: He exhibits no edema and no tenderness.  Neurological: He is alert. He has normal strength. No sensory deficit. Cranial nerve deficit:  no gross defecits noted. He exhibits normal muscle tone. He displays no seizure activity. Coordination normal.  Skin: Skin is warm and dry. No rash noted.  Psychiatric: He has a normal mood and affect.    ED Course  Procedures (including critical care time) A Foley catheter was irrigated. There is no evidence of obstruction of the catheter and it is draining properly. Labs Reviewed    BASIC METABOLIC PANEL - Abnormal; Notable for the following:    Glucose, Bld 133 (*)    All other components within normal limits  URINALYSIS, ROUTINE W REFLEX MICROSCOPIC - Abnormal; Notable for the following:    Hgb urine dipstick LARGE (*)    All other components within normal limits  URINE MICROSCOPIC-ADD ON    1. Complication of Foley catheter      MDM Patient's Foley catheter seems to be draining properly. There is no evidence of renal failure. No evidence of UTI. He may be having some bladder spasms associated with the Foley catheter.        Celene Kras, MD 11/06/10 0530

## 2010-11-06 NOTE — ED Notes (Signed)
Pt reports feeling better care plan reviewed with son at bedside

## 2010-11-06 NOTE — ED Notes (Signed)
Pt had his gall bladder removed on the 18th and had difficulty urinating after pt had a cath placed. Pt had the first one removed and then had a second one placed pt states that the second cath placement was traumatic pt with decreased output and low abd discomfort

## 2010-11-06 NOTE — ED Notes (Signed)
MD at bedside.Foley cath irrigated with 100cc sterile NS no clots noted foley draining clear yellow urine no abd distention

## 2010-11-07 NOTE — Consult Note (Addendum)
Ricky Bradley, Ricky Bradley NO.:  [id number]  MEDICAL RECORD NO.:  [id number]  LOCATION:  3737                         FACILITY:  [facility]  PHYSICIAN:  Duke Salvia, MD, FACCDATE OF BIRTH:  1933/10/20  DATE OF CONSULTATION:  11/02/2010 DATE OF DISCHARGE:                                CONSULTATION   PRIMARY CARDIOLOGIST:  New.  CHIEF COMPLAINT:  Abdominal pain.  REASON FOR CONSULTATION:  Nonsustained VT.  HISTORY OF PRESENT ILLNESS:  [name] is a 77 year old gentleman with a history of hypertension, hyperlipidemia, and GERD with no prior cardiac history who was admitted for a planned laparoscopic cholecystectomy.  He was seen in the office on October 18, 2010, for symptomatic cholelithiasis, abdominal pain worse with greasy foods, located on the right side.  He underwent laparoscopic cholecystectomy on November 01, 2010, and postoperatively was noted to have sinus tachycardia of heart rates in the low 100s, so he was observed overnight.  He had 10 beats of nonsustained VT noted at approximately 7 p.m. last night.  The patient was completely asymptomatic and denies any chest pain or palpitations. He also had urinary retention this morning requiring urology consult for Foley placement.  The patient generally stays active and owns rabbits, dogs, and petals around the yard with no chest pain.  However, he does endorse an occasional dyspnea on exertion, with this does not necessarily prohibit activities.  It may be worse since his surgery on his back in July 2012. Going back on prior records, it appears that the patient has had a chronic sinus tachycardia dating back even to 2002.  EKG demonstrates normal sinus rhythm with nonspecific ST-T changes.  PAST MEDICAL HISTORY: 1. Hypertension. 2. GERD. 3. Hyperlipidemia. 4. Neurogenic claudication, status post laminectomy in July 2012. 5. BPH. 6. Vertigo. 7. Status post knee replacement. 8. Status post cataract  surgery.  No prior cardiac workup.  IMPATIENT MEDICATIONS: 1. Norvasc 2.5 mg nightly. 2. Amoxicillin 1000 mg b.i.d. 3. Aspirin 81 mg daily. 4. Bethanechol 25 mg b.i.d. 5. Colace 200 mg daily. 6. Claritin 10 mg daily. 7. Protonix 80 mg daily. 8. MiraLax every other day. 9. Zocor 20 mg daily. 10.Flomax 0.4 mg daily.  ALLERGIES:  VICODIN.  SOCIAL HISTORY:  [name] lives in [place] with his wife of 53 years.  They have three children and two grandchildren.  He does not smoke.  He does not drink regularly, if ever.  The last drink he had was approximately 6 months ago.  FAMILY HISTORY:  Mother is living at 19, in relatively good health except for arthritis.  Father died at age 109 and the official death certificate stated it was from congestive heart failure, although the patient did not think that.  His father did not have any heart problems up until then.  He has three sisters without coronary artery disease.  REVIEW OF SYSTEMS:  No chest pain or orthopnea.  He does get occasional shortness of breath particularly with steps.  No syncope.  He has had vertigo for several years, occurring every 2-3 weeks with the sensation of dizziness lasting only seconds particularly exacerbated by position changes.  No nausea, vomiting,  or diarrhea.  All other systems reviewed and otherwise negative.  LABORATORY DATA:  CBC with normal preop sodium this morning 141, potassium 3.2, chloride 105, CO2 of 26, glucose 140, BUN 10, and creatinine 0.86.  EKG:  Normal sinus rhythm, low voltage, nonspecific ST-T changes, T-wave inversion in III and flattening in aVF with Qs noted in lead III.  RADIOLOGIC STUDIES:  Cholangiogram was negative.  PHYSICAL EXAMINATION:  VITAL SIGNS:  Temperature 98.1, pulse 91, respirations 18, blood pressure 107/67, and pulse ox 95% on room air. GENERAL:  This is a pleasant elderly white male in no acute distress. HEENT:  Normocephalic and atraumatic.  Extraocular  movements are intact. Clear sclerae.  Nares without discharge. NECK:  Supple without carotid bruit. HEART:  Auscultation of the heart reveals really tachycardic rate, regular rhythm without murmurs, rubs, or gallops. LUNGS:  Clear to auscultation bilaterally without wheezes, rales, or rhonchi. ABDOMEN:  Soft, nontender, and nondistended.  Positive bowel sounds. EXTREMITIES:  Warm, dry, and without edema. NEUROLOGICAL:  He is alert and oriented x3 and responds questions appropriately with normal affect.  ASSESSMENT/PLAN:  The patient was seen and examined by Dr. Graciela Husbands and myself.  This is 24 year old gentleman with no prior cardiac history, history of hypertension, hyperlipidemia, and apparent sinus tachycardia dating back to several years who presents for a laparoscopic cholecystectomy complicated by postop urinary retention.  We are asked to see him regarding nonsustained VT, a 10-beat episode of unclear etiology.  The issue of his VT is whether acute or chronic cardiac pathology.  He has had postop dyspnea on exertion ever since his back surgery, which raises the possibility of pulmonary embolism, so we will obtain CT angio to rule out PE.  His creatinine is normal.  We will replete his potassium and cycle cardiac enzymes to rule out MI as a precipitating factor.  Two-D echocardiogram will be obtained.  If his echo shows changes of chronic PE, would possibly need to consider V/Q to rule out chronic pulmonary embolism.  We will also check thyroid studies given his sinus tachycardia.  He may also need evaluation at some point for diabetes given his elevated CBG while in the hospital.  We will add Lopressor 12.5 mg p.o. b.i.d. and discontinue his Norvasc to allow for more blood pressure room.     Ronie Bradley, P.A.C.   ______________________________ Duke Salvia, MD, [facility]    DD/MEDQ  D:  11/02/2010  T:  11/02/2010  Job:  [id number]  cc:   Petra Kuba, M.D.  Electronically  Signed by Sherryl Manges MD [facility] on 11/07/2010 04:05:38 PM Electronically Signed by Ronie Bradley  on 11/10/2010 07:05:36 PM

## 2010-11-08 ENCOUNTER — Telehealth (INDEPENDENT_AMBULATORY_CARE_PROVIDER_SITE_OTHER): Payer: Self-pay | Admitting: General Surgery

## 2010-11-17 ENCOUNTER — Encounter (INDEPENDENT_AMBULATORY_CARE_PROVIDER_SITE_OTHER): Payer: Medicare Other | Admitting: General Surgery

## 2010-11-17 ENCOUNTER — Encounter (INDEPENDENT_AMBULATORY_CARE_PROVIDER_SITE_OTHER): Payer: Self-pay | Admitting: General Surgery

## 2010-11-18 ENCOUNTER — Encounter (INDEPENDENT_AMBULATORY_CARE_PROVIDER_SITE_OTHER): Payer: Medicare Other | Admitting: General Surgery

## 2010-11-18 NOTE — Consult Note (Signed)
  NAMEPALMER, FAHRNER NO.:  [id number]  MEDICAL RECORD NO.:  [id number]  LOCATION:                                 FACILITY:  PHYSICIAN:  Xyon Lukasik I. Patsi Sears, M.D.DATE OF BIRTH:  Nov 09, 1933  DATE OF CONSULTATION:  11/02/2010 DATE OF DISCHARGE:                                CONSULTATION   TIME:  0830.  SUBJECTIVE:  Mr. Forse is a 77 year old male, status post recent lumbar back surgery with acute urinary retention.  The patient was seen by Dr. Retta Diones in consultation for urinary retention, Foley catheter was placed.  He was placed on Flomax and bethanechol, and is due to see Dr. Retta Diones next week.  However, the patient has now undergone laparoscopic cholecystectomy, and has postoperative urinary retention again, despite medications.  The patient has had multiple attempts at Foley catheterization by nursing staff, has bloody return.  He is now for Foley catheter placement by Urology.  PAST MEDICAL HISTORY:  Significant for BPH.  ALLERGIES:  OXYCODONE.  CURRENT MEDICATIONS:  Prilosec, Allegra, Advil, and iron.  PAST SURGICAL HISTORY: 1. Vasectomy, 1967, (Dr. Dannette Barbara). 2. Nasal surgery. 3. Right knee surgery. 4. Left knee surgery.  Tobacco:  Occasional cigar.  Alcohol none.  Drug use none.  The patient is married with three children.  FAMILY HISTORY:  Father died of congestive heart failure.  Mother is 48 alive and well.  He has three sisters.  He has two children.  CONSTITUTIONAL REVIEW OF SYSTEMS:  Significant for a history of urgency, frequency, nocturia.  PHYSICAL EXAMINATION:  GENERAL:  A well-developed male, in moderate distress today. ABDOMEN:  Distended. GENITOURINARY:  There is blood at the meatal tip.  The patient is status post laparoscopic cholecystectomy and has appropriate surgical wounds. Suprapubic area is distended and palpable and uncomfortable to palpation.  The penis is, otherwise, normal and circumcised.  Scrotum  is normal.  Testicles are descended bilaterally. EXTREMITIES:  Without edema. PSYCHOLOGIC:  Normal.  IMPRESSION:  Acute urinary retention, despite Foley catheterization attempts.  There was blood at the meatus indicating traumatic Foley catheterization.  The patient will need catheterization this morning.     Misk Galentine I. Patsi Sears, M.D.     SIT/MEDQ  D:  11/02/2010  T:  11/02/2010  Job:  [id number]  cc:   Bertram Millard. Dahlstedt, M.D.  Electronically Signed by Jethro Bolus M.D. on 11/18/2010 12:46:34 PM

## 2010-11-21 LAB — DIFFERENTIAL
Eosinophils Absolute: 0.1 — ABNORMAL LOW
Eosinophils Relative: 2
Lymphocytes Relative: 15
Lymphs Abs: 1.1
Monocytes Absolute: 0.6

## 2010-11-21 LAB — I-STAT 8, (EC8 V) (CONVERTED LAB)
BUN: 11
Chloride: 112
Glucose, Bld: 122 — ABNORMAL HIGH
Potassium: 3.9
Sodium: 143
pH, Ven: 7.48 — ABNORMAL HIGH

## 2010-11-21 LAB — CBC
HCT: 45.1
Hemoglobin: 15.3
MCV: 83.2
Platelets: 195
WBC: 7.7

## 2010-11-21 LAB — POCT I-STAT CREATININE: Operator id: 285491

## 2010-11-25 NOTE — Discharge Summary (Signed)
NAMESWAYZE, PRIES NO.:  [id number]  MEDICAL RECORD NO.:  [id number]  LOCATION:  3737                         FACILITY:  [facility]  PHYSICIAN:  Lodema Pilot, MD       DATE OF BIRTH:  02-11-1934  DATE OF ADMISSION:  11/01/2010 DATE OF DISCHARGE:  11/03/2010                              DISCHARGE SUMMARY   ADMITTING DIAGNOSES:  Abdominal pain and cholelithiasis.  DISCHARGE DIAGNOSES: 1. Abdominal pain. 2. Cholelithiasis. 3. Urinary retention. 4. Ventricular tachycardia. 5. Hypertension.  PRINCIPAL PROCEDURES PERFORMED:  Laparoscopic cholecystectomy on November 01, 2010, echocardiogram and CT angio of the chest, cysto with Foley placement.  HISTORY OF PRESENT ILLNESS:  Ricky Bradley is a 77 year old male who had persistent right upper quadrant pain and right-sided abdominal pain with CT findings concerning for cholelithiasis.  For full history and physical, please refer to my history and physical on the chart.  HOSPITAL COURSE:  Ricky Bradley was admitted as an outpatient for laparoscopic cholecystectomy on November 01, 2010, for treatment of right-sided abdominal pain and cholelithiasis.  Procedure was uneventful; however, in the recovery room, he was unable to void prior to discharge and had slight tachycardia in the 100-110 heart rate range. Decided to keep him overnight for observation for his heart rate as well as for treatment of urinary retention.  Foley catheter was placed in the recovery room with immediate relief of his bladder distention and his tachycardia.  He was admitted to the telemetry floor given his elevated heart rate and overnight on the day of discharge on the evening of surgery, he had 10-beat run of ventricular tachycardia, which was asymptomatic and he remained hemodynamically stable.  Also, his Foley catheter was placed at 2 o'clock in the morning and on postop day #1, he was still unable to void and uncomfortable from bladder  distention and required Foley placement again.  Urology was consulted for placement of the Foley as the nurses were unable to place a Foley on the telemetry floor, and Dr. Patsi Sears was consulted and performed a bedside cysto and Foley placement.  Also, Cardiology was consulted on postop day #1 for evaluation of his ventricular tachycardia and multiple cardiac tests were performed including, echo CT angio chest, thyroid function studies and cardiac enzymes and these were all essentially negative and he was cleared from a cardiac standpoint.  He was at minimal abdominal pain and was tolerating regular diet, and was ambulatory and he was stable and ready for discharge home on postop day #2.  DISCHARGE INSTRUCTIONS:  He was instructed to follow up with Dr. Retta Diones, his regular urologist for evaluation and management of his Foley, which was left in place at discharge.  He already had a scheduled appointment with him the following week.  He was instructed to follow up with his primary physician for management of his hypertension andtachycardia and to schedule followup evaluation for the opacity seen in the lower lung windows of his CT angio and to obtain scheduled followup CT scan in 2-3 months.  He was instructed to follow up with Dr. Biagio Quint at [facility] in approximately 2 weeks for repeat evaluation.  He was instructed  to return to the emergency room for any further weakness, chest pains, shortness of breath, increasing abdominal pain, fevers or chills, or inability to tolerate a diet.  DISCHARGE MEDICATIONS:  Please refer to discharge medication reconciliation.  Notably, his amlodipine was discontinued and he was started on Lopressor 12.5 mg b.i.d. and his Nexium prescription was refilled.  He was discharged home on Ultram 50 mg p.o. q.6 h p.r.n. pain and as well as his regular discharge medications except for his amlodipine which was discontinued.  He was also prescribed  Cipro 500 mg b.i.d. by Dr. Patsi Sears.          ______________________________ Lodema Pilot, MD     BL/MEDQ  D:  11/04/2010  T:  11/04/2010  Job:  [id number]  Electronically Signed by Lodema Pilot DO on 11/25/2010 12:10:05 AM

## 2010-11-30 ENCOUNTER — Encounter (INDEPENDENT_AMBULATORY_CARE_PROVIDER_SITE_OTHER): Payer: Self-pay | Admitting: General Surgery

## 2010-11-30 ENCOUNTER — Ambulatory Visit (INDEPENDENT_AMBULATORY_CARE_PROVIDER_SITE_OTHER): Payer: Medicare Other | Admitting: General Surgery

## 2010-11-30 VITALS — BP 118/72 | HR 100 | Temp 97.8°F | Resp 20 | Ht 72.0 in | Wt 189.1 lb

## 2010-11-30 DIAGNOSIS — Z4889 Encounter for other specified surgical aftercare: Secondary | ICD-10-CM

## 2010-11-30 DIAGNOSIS — Z5189 Encounter for other specified aftercare: Secondary | ICD-10-CM

## 2010-11-30 NOTE — Progress Notes (Signed)
Subjective:     Patient ID: Ricky Bradley, male   DOB: 02/03/1934, 77 y.o.   MRN: [id number]  HPI This patient follows up approximately 1 month status post laparoscopic cholecystectomy. He had a rough postoperative course with urinary retention and UTI and has been followed by his urologist for this. He states that this is improving. He also had some stable V. tach and had extensive cardiology workup but the workup was negative for this. He states that he is feeling much better and can eat without having the right upper quadrant pain that he was having preoperatively but he states that his appetite is still not as good as it has been in the past and he still complains of intermittent bloating. He is taking MiraLax for constipation.Pathology was benign.  Review of Systems     Objective:   Physical Exam No acute distress and nontoxic-appearing  His abdomen is soft and nontender on exam his incisions are well-healed without sign of infection. He is nondistended.    Assessment:     Status post laparoscopic cholecystectomy. He seems to be improving. I think a lot of his symptoms can be attributed to his postoperative state. I am encouraged that his right upper quadrant pain has resolved. The bloating that he complains of may be a separate issue and not related to his gallbladder.    Plan:     I recommended that he gradually increase his activity as tolerated and that's all his complaints may improve with time. If his bloating does not improve and I recommended that he followup with his primary physician or his gastroenterologist for further evaluation.Him follow up with me as needed for bloating or any other questions related to surgery.If his bloating symptoms do not completely resolve in the next 2-3 months, I would consider gastric emptying scan.

## 2010-12-01 ENCOUNTER — Emergency Department (INDEPENDENT_AMBULATORY_CARE_PROVIDER_SITE_OTHER): Payer: Medicare Other

## 2010-12-01 ENCOUNTER — Other Ambulatory Visit: Payer: Self-pay

## 2010-12-01 ENCOUNTER — Emergency Department (HOSPITAL_BASED_OUTPATIENT_CLINIC_OR_DEPARTMENT_OTHER)
Admission: EM | Admit: 2010-12-01 | Discharge: 2010-12-01 | Disposition: A | Discharge status: Home / Self Care | Payer: Medicare Other | Attending: Emergency Medicine | Admitting: Emergency Medicine

## 2010-12-01 ENCOUNTER — Encounter (HOSPITAL_BASED_OUTPATIENT_CLINIC_OR_DEPARTMENT_OTHER): Payer: Self-pay | Admitting: *Deleted

## 2010-12-01 DIAGNOSIS — E7800 Pure hypercholesterolemia, unspecified: Secondary | ICD-10-CM | POA: Insufficient documentation

## 2010-12-01 DIAGNOSIS — K8020 Calculus of gallbladder without cholecystitis without obstruction: Secondary | ICD-10-CM | POA: Insufficient documentation

## 2010-12-01 DIAGNOSIS — I10 Essential (primary) hypertension: Secondary | ICD-10-CM | POA: Insufficient documentation

## 2010-12-01 DIAGNOSIS — K5730 Diverticulosis of large intestine without perforation or abscess without bleeding: Secondary | ICD-10-CM | POA: Insufficient documentation

## 2010-12-01 DIAGNOSIS — Z79899 Other long term (current) drug therapy: Secondary | ICD-10-CM | POA: Insufficient documentation

## 2010-12-01 DIAGNOSIS — K219 Gastro-esophageal reflux disease without esophagitis: Secondary | ICD-10-CM | POA: Insufficient documentation

## 2010-12-01 DIAGNOSIS — R1031 Right lower quadrant pain: Secondary | ICD-10-CM

## 2010-12-01 DIAGNOSIS — N400 Enlarged prostate without lower urinary tract symptoms: Secondary | ICD-10-CM | POA: Insufficient documentation

## 2010-12-01 DIAGNOSIS — R109 Unspecified abdominal pain: Secondary | ICD-10-CM | POA: Insufficient documentation

## 2010-12-01 LAB — URINALYSIS, ROUTINE W REFLEX MICROSCOPIC
Bilirubin Urine: NEGATIVE
Hgb urine dipstick: NEGATIVE
Specific Gravity, Urine: 1.006 (ref 1.005–1.030)
Urobilinogen, UA: 0.2 mg/dL (ref 0.0–1.0)

## 2010-12-01 LAB — DIFFERENTIAL
Basophils Relative: 0 % (ref 0–1)
Monocytes Absolute: 0.8 10*3/uL (ref 0.1–1.0)
Monocytes Relative: 10 % (ref 3–12)
Neutro Abs: 4.9 10*3/uL (ref 1.7–7.7)

## 2010-12-01 LAB — CBC
HCT: 42.2 % (ref 39.0–52.0)
Hemoglobin: 14.7 g/dL (ref 13.0–17.0)
MCV: 82.6 fL (ref 78.0–100.0)
RBC: 5.11 MIL/uL (ref 4.22–5.81)
RDW: 14.1 % (ref 11.5–15.5)
WBC: 8.3 10*3/uL (ref 4.0–10.5)

## 2010-12-01 LAB — COMPREHENSIVE METABOLIC PANEL
Albumin: 3.8 g/dL (ref 3.5–5.2)
Alkaline Phosphatase: 93 U/L (ref 39–117)
BUN: 16 mg/dL (ref 6–23)
CO2: 25 mEq/L (ref 19–32)
Chloride: 107 mEq/L (ref 96–112)
Creatinine, Ser: 1 mg/dL (ref 0.50–1.35)
GFR calc non Af Amer: 71 mL/min — ABNORMAL LOW (ref >90)
Potassium: 3.6 mEq/L (ref 3.5–5.1)
Total Bilirubin: 0.4 mg/dL (ref 0.3–1.2)

## 2010-12-01 MED ORDER — SODIUM CHLORIDE 0.9 % IV SOLN
20.0000 mL | INTRAVENOUS | Status: DC
  Administered 2010-12-01: 20 mL via INTRAVENOUS
  Administered 2010-12-01: 1000 mL via INTRAVENOUS

## 2010-12-01 MED ORDER — ONDANSETRON 8 MG PO TBDP: 8.0000 mg | ORAL_TABLET | Freq: Three times a day (TID) | ORAL | Status: AC | PRN

## 2010-12-01 MED ORDER — ONDANSETRON 8 MG PO TBDP
ORAL_TABLET | ORAL | Status: AC
  Administered 2010-12-01: 8 mg
  Filled 2010-12-01: qty 1

## 2010-12-01 MED ORDER — ONDANSETRON HCL 4 MG/2ML IJ SOLN
4.0000 mg | Freq: Once | INTRAMUSCULAR | Status: AC
  Administered 2010-12-01: 4 mg via INTRAVENOUS
  Filled 2010-12-01: qty 2

## 2010-12-01 NOTE — ED Notes (Signed)
Pt reports dull pain to his RLQ since this morning. Has been taking Advil with some relief. Pt states he has had some intermittent bloating since his gallbladder surgery last month, but that this pain is different. Denies N/V/D.

## 2010-12-01 NOTE — ED Provider Notes (Signed)
History     CSN: [id number] Arrival date & time: 12/01/2010  7:36 PM   First MD Initiated Contact with Patient 12/01/10 1945      Chief Complaint  Patient presents with  . Abdominal Pain    (Consider location/radiation/quality/duration/timing/severity/associated sxs/prior treatment) HPI The patient states she started having some dull right lower quadrant pain this morning. He initially took some Advil and that helped his discomfort. It started up again later in the day and he felt bloated this we took some Gas-X. Patient states that seemed to help a little bit as well but he continued to have some right lower quadrant abdominal pain patient denies any nausea or vomiting no diarrhea. He's not had any fevers or chills. Patient did have gallbladder surgery last month and has felt pounds with bloating at times since then. Patient does not think the pain is particularly increased with food. The pain was mild to moderate Past Medical History  Diagnosis Date  . Hypertension   . GERD (gastroesophageal reflux disease)   . High cholesterol   . Enlarged prostate   . High cholesterol     Past Surgical History  Procedure Date  . Joint replacement 2001  and 2002    both knees  . Laminectomy 08/2010  . Back surgery   . Replacement total knee   . Cholecystectomy 11/01/10    No family history on file.  History  Substance Use Topics  . Smoking status: Former Games developer  . Smokeless tobacco: Not on file  . Alcohol Use: No      Review of Systems  All other systems reviewed and are negative.    Allergies  Vicodin  Home Medications   Current Outpatient Rx  Name Route Sig Dispense Refill  . ASPIRIN 81 MG PO TBEC Oral Take 81 mg by mouth daily. Stop taking before surgery    . BETHANECHOL CHLORIDE 25 MG PO TABS Oral Take 25 mg by mouth 2 (two) times daily.      Marland Kitchen ZYRTEC PO Oral Take by mouth daily.      Marland Kitchen LOVASTATIN 40 MG PO TABS Oral Take 40 mg by mouth at bedtime.      Marland Kitchen METOPROLOL  SUCCINATE 25 MG PO TB24 Oral Take 12.5 mg by mouth daily.      Marland Kitchen NEXIUM 40 MG PO CPDR      . TAMSULOSIN HCL 0.4 MG PO CAPS Oral Take 0.4 mg by mouth 2 (two) times daily.     Marland Kitchen METOPROLOL TARTRATE 25 MG PO TABS      . NITROFURANTOIN MONOHYD MACRO 100 MG PO CAPS        BP 136/74  Pulse 90  Temp(Src) 98.5 F (36.9 C) (Oral)  Resp 18  Ht 6' (1.829 m)  Wt 189 lb (85.73 kg)  BMI 25.63 kg/m2  SpO2 99%  Physical Exam  Nursing note and vitals reviewed. Constitutional: He appears well-developed and well-nourished. No distress.  HENT:  Head: Normocephalic and atraumatic.  Right Ear: External ear normal.  Left Ear: External ear normal.  Eyes: Conjunctivae are normal. Right eye exhibits no discharge. Left eye exhibits no discharge. No scleral icterus.  Neck: Neck supple. No tracheal deviation present.  Cardiovascular: Normal rate, regular rhythm and intact distal pulses.   Pulmonary/Chest: Effort normal and breath sounds normal. No stridor. No respiratory distress. He has no wheezes. He has no rales.  Abdominal: Soft. Bowel sounds are normal. He exhibits no distension and no mass. There is no tenderness.  There is no rebound and no guarding.  Musculoskeletal: He exhibits no edema and no tenderness.  Neurological: He is alert. He has normal strength. No sensory deficit. Cranial nerve deficit:  no gross defecits noted. He exhibits normal muscle tone. He displays no seizure activity. Coordination normal.  Skin: Skin is warm and dry. No rash noted.  Psychiatric: He has a normal mood and affect.    ED Course  Procedures (including critical care time)  Date: 12/01/2010  Rate: 79  Rhythm: normal sinus rhythm  QRS Axis: normal  Intervals: normal  ST/T Wave abnormalities: normal  Conduction Disutrbances:none  Narrative Interpretation:   Old EKG Reviewed: No significant changes   Labs Reviewed  COMPREHENSIVE METABOLIC PANEL - Abnormal; Notable for the following:    Glucose, Bld 165 (*)     GFR calc non Af Amer 71 (*)    GFR calc Af Amer 83 (*)    All other components within normal limits  CBC  LIPASE, BLOOD  URINALYSIS, ROUTINE W REFLEX MICROSCOPIC  DIFFERENTIAL   Dg Abd Acute W/chest  12/01/2010  *RADIOLOGY REPORT*  Clinical Data: Right lower quadrant pain, previous Laproscopic cholecystectomy  ACUTE ABDOMEN SERIES (ABDOMEN 2 VIEW & CHEST 1 VIEW)  Comparison: 09/14/2010, 10/06/2010  Findings: Normal heart size and vascularity.  Clear lungs without pneumonia, edema, collapse, consolidation, effusion or pneumothorax.  Previous cholecystectomy evident.  Scattered air and stool throughout the bowel.  Negative for obstruction or dilatation.  No ileus pattern.  Slight degenerative changes of the spine and SI joints.  Pelvic calcifications likely venous phleboliths.  IMPRESSION: No acute finding.  Previous cholecystectomy.  Original Report Authenticated By: Judie Petit. Ruel Favors, M.D.     No diagnosis found.  CT scan results of 10/06/2010 IMPRESSION:  1. No acute finding.  2. Single gallstone without evidence of cholecystitis.  3. L3-4 and L4-5 laminectomy.  4. Mild appearing sigmoid diverticulosis. No diverticulitis.  5. Enlarged prostate gland.    MDM  Patient is feeling better at this time. Repeat exam he exhibits no significant tenderness to palpation. Overall there is no evidence to suggest obstruction, acute appendicitis, acute vascular emergency, diverticulitis. There does not appear to be in and see urinary tract infection or urolithiasis. Patient has been having some issues with constipation and bloating since his surgery this could be related to that. At this time I do not feel there is any evidence to suggest an acute emergency medical condition. Do not feel that repeat abdominal CT scan is necessary based on his exam and findings and review of the recent CAT scan on August 23.        Celene Kras, MD 12/01/10 2152

## 2010-12-04 ENCOUNTER — Emergency Department (HOSPITAL_COMMUNITY): Payer: Medicare Other

## 2010-12-04 ENCOUNTER — Emergency Department (HOSPITAL_COMMUNITY)
Admission: EM | Admit: 2010-12-04 | Discharge: 2010-12-05 | Disposition: A | Discharge status: Home / Self Care | Payer: Medicare Other | Attending: Emergency Medicine | Admitting: Emergency Medicine

## 2010-12-04 DIAGNOSIS — Z7982 Long term (current) use of aspirin: Secondary | ICD-10-CM | POA: Insufficient documentation

## 2010-12-04 DIAGNOSIS — K5900 Constipation, unspecified: Secondary | ICD-10-CM | POA: Insufficient documentation

## 2010-12-04 DIAGNOSIS — R141 Gas pain: Secondary | ICD-10-CM | POA: Insufficient documentation

## 2010-12-04 DIAGNOSIS — R10813 Right lower quadrant abdominal tenderness: Secondary | ICD-10-CM | POA: Insufficient documentation

## 2010-12-04 DIAGNOSIS — Z79899 Other long term (current) drug therapy: Secondary | ICD-10-CM | POA: Insufficient documentation

## 2010-12-04 DIAGNOSIS — R143 Flatulence: Secondary | ICD-10-CM | POA: Insufficient documentation

## 2010-12-04 DIAGNOSIS — R142 Eructation: Secondary | ICD-10-CM | POA: Insufficient documentation

## 2010-12-04 DIAGNOSIS — K219 Gastro-esophageal reflux disease without esophagitis: Secondary | ICD-10-CM | POA: Insufficient documentation

## 2010-12-04 DIAGNOSIS — Z9889 Other specified postprocedural states: Secondary | ICD-10-CM | POA: Insufficient documentation

## 2010-12-04 DIAGNOSIS — I10 Essential (primary) hypertension: Secondary | ICD-10-CM | POA: Insufficient documentation

## 2010-12-04 DIAGNOSIS — R1031 Right lower quadrant pain: Secondary | ICD-10-CM | POA: Insufficient documentation

## 2010-12-04 LAB — CBC
HCT: 42.2 % (ref 39.0–52.0)
MCHC: 36.3 g/dL — ABNORMAL HIGH (ref 30.0–36.0)
MCV: 82.6 fL (ref 78.0–100.0)
RDW: 13.5 % (ref 11.5–15.5)

## 2010-12-04 LAB — URINALYSIS, ROUTINE W REFLEX MICROSCOPIC
Bilirubin Urine: NEGATIVE
Hgb urine dipstick: NEGATIVE
Protein, ur: NEGATIVE mg/dL
Urobilinogen, UA: 0.2 mg/dL (ref 0.0–1.0)

## 2010-12-04 LAB — COMPREHENSIVE METABOLIC PANEL
ALT: 10 U/L (ref 0–53)
CO2: 24 mEq/L (ref 19–32)
Calcium: 9.5 mg/dL (ref 8.4–10.5)
Creatinine, Ser: 1.08 mg/dL (ref 0.50–1.35)
GFR calc Af Amer: 76 mL/min — ABNORMAL LOW (ref >90)
GFR calc non Af Amer: 65 mL/min — ABNORMAL LOW (ref >90)
Glucose, Bld: 112 mg/dL — ABNORMAL HIGH (ref 70–99)

## 2010-12-04 LAB — DIFFERENTIAL
Basophils Absolute: 0 10*3/uL (ref 0.0–0.1)
Eosinophils Relative: 2 % (ref 0–5)
Lymphocytes Relative: 28 % (ref 12–46)
Lymphs Abs: 2.4 10*3/uL (ref 0.7–4.0)
Monocytes Absolute: 0.8 10*3/uL (ref 0.1–1.0)

## 2010-12-04 MED ORDER — IOHEXOL 300 MG/ML  SOLN
100.0000 mL | Freq: Once | INTRAMUSCULAR | Status: AC | PRN
  Administered 2010-12-04: 100 mL via INTRAVENOUS

## 2010-12-10 ENCOUNTER — Emergency Department (HOSPITAL_COMMUNITY): Payer: Medicare Other

## 2010-12-10 ENCOUNTER — Emergency Department (HOSPITAL_COMMUNITY)
Admission: EM | Admit: 2010-12-10 | Discharge: 2010-12-10 | Disposition: A | Discharge status: Home / Self Care | Payer: Medicare Other | Attending: Emergency Medicine | Admitting: Emergency Medicine

## 2010-12-10 DIAGNOSIS — R1031 Right lower quadrant pain: Secondary | ICD-10-CM | POA: Insufficient documentation

## 2010-12-10 DIAGNOSIS — Z9089 Acquired absence of other organs: Secondary | ICD-10-CM | POA: Insufficient documentation

## 2010-12-10 DIAGNOSIS — K219 Gastro-esophageal reflux disease without esophagitis: Secondary | ICD-10-CM | POA: Insufficient documentation

## 2010-12-10 DIAGNOSIS — I10 Essential (primary) hypertension: Secondary | ICD-10-CM | POA: Insufficient documentation

## 2010-12-10 DIAGNOSIS — E876 Hypokalemia: Secondary | ICD-10-CM | POA: Insufficient documentation

## 2010-12-10 DIAGNOSIS — E7800 Pure hypercholesterolemia, unspecified: Secondary | ICD-10-CM | POA: Insufficient documentation

## 2010-12-10 LAB — DIFFERENTIAL
Basophils Absolute: 0 10*3/uL (ref 0.0–0.1)
Basophils Relative: 0 % (ref 0–1)
Eosinophils Absolute: 0.2 10*3/uL (ref 0.0–0.7)
Eosinophils Relative: 3 % (ref 0–5)
Lymphocytes Relative: 32 % (ref 12–46)

## 2010-12-10 LAB — POCT I-STAT, CHEM 8
Glucose, Bld: 127 mg/dL — ABNORMAL HIGH (ref 70–99)
HCT: 44 % (ref 39.0–52.0)
Hemoglobin: 15 g/dL (ref 13.0–17.0)
Potassium: 3.2 mEq/L — ABNORMAL LOW (ref 3.5–5.1)
TCO2: 22 mmol/L (ref 0–100)

## 2010-12-10 LAB — OCCULT BLOOD, POC DEVICE: Fecal Occult Bld: NEGATIVE

## 2010-12-10 LAB — URINALYSIS, ROUTINE W REFLEX MICROSCOPIC
Bilirubin Urine: NEGATIVE
Nitrite: NEGATIVE
Protein, ur: NEGATIVE mg/dL
Specific Gravity, Urine: 1.006 (ref 1.005–1.030)
Urobilinogen, UA: 0.2 mg/dL (ref 0.0–1.0)

## 2010-12-10 LAB — BASIC METABOLIC PANEL
Chloride: 107 mEq/L (ref 96–112)
GFR calc Af Amer: >90 mL/min (ref >90)
GFR calc non Af Amer: 78 mL/min — ABNORMAL LOW (ref >90)
Potassium: 3.1 mEq/L — ABNORMAL LOW (ref 3.5–5.1)
Sodium: 142 mEq/L (ref 135–145)

## 2010-12-10 LAB — CBC
HCT: 41.7 % (ref 39.0–52.0)
MCHC: 36 g/dL (ref 30.0–36.0)
Platelets: 173 10*3/uL (ref 150–400)
RDW: 13.7 % (ref 11.5–15.5)
WBC: 6.7 10*3/uL (ref 4.0–10.5)

## 2010-12-10 MED ORDER — IOHEXOL 300 MG/ML  SOLN
100.0000 mL | Freq: Once | INTRAMUSCULAR | Status: AC | PRN
  Administered 2010-12-10: 100 mL via INTRAVENOUS

## 2010-12-11 LAB — URINE CULTURE
Colony Count: NO GROWTH
Culture  Setup Time: 201210271110
Culture: NO GROWTH

## 2011-01-08 ENCOUNTER — Inpatient Hospital Stay (HOSPITAL_COMMUNITY)
Admission: EM | Admit: 2011-01-08 | Discharge: 2011-01-27 | DRG: 471 | Disposition: A | Discharge status: Rehabilitation Facility | Payer: Medicare Other | Source: Ambulatory Visit | Attending: Internal Medicine | Admitting: Internal Medicine

## 2011-01-08 ENCOUNTER — Emergency Department (HOSPITAL_COMMUNITY): Payer: Medicare Other

## 2011-01-08 ENCOUNTER — Encounter (HOSPITAL_COMMUNITY): Payer: Self-pay | Admitting: *Deleted

## 2011-01-08 DIAGNOSIS — D72829 Elevated white blood cell count, unspecified: Secondary | ICD-10-CM | POA: Diagnosis not present

## 2011-01-08 DIAGNOSIS — R29898 Other symptoms and signs involving the musculoskeletal system: Secondary | ICD-10-CM | POA: Insufficient documentation

## 2011-01-08 DIAGNOSIS — G8250 Quadriplegia, unspecified: Secondary | ICD-10-CM | POA: Diagnosis not present

## 2011-01-08 DIAGNOSIS — E785 Hyperlipidemia, unspecified: Secondary | ICD-10-CM | POA: Diagnosis present

## 2011-01-08 DIAGNOSIS — T380X5A Adverse effect of glucocorticoids and synthetic analogues, initial encounter: Secondary | ICD-10-CM | POA: Diagnosis not present

## 2011-01-08 DIAGNOSIS — R339 Retention of urine, unspecified: Secondary | ICD-10-CM | POA: Diagnosis not present

## 2011-01-08 DIAGNOSIS — IMO0002 Reserved for concepts with insufficient information to code with codable children: Secondary | ICD-10-CM | POA: Diagnosis not present

## 2011-01-08 DIAGNOSIS — M48062 Spinal stenosis, lumbar region with neurogenic claudication: Secondary | ICD-10-CM | POA: Clinically undetermined

## 2011-01-08 DIAGNOSIS — B957 Other staphylococcus as the cause of diseases classified elsewhere: Secondary | ICD-10-CM | POA: Diagnosis not present

## 2011-01-08 DIAGNOSIS — N390 Urinary tract infection, site not specified: Secondary | ICD-10-CM | POA: Diagnosis not present

## 2011-01-08 DIAGNOSIS — N401 Enlarged prostate with lower urinary tract symptoms: Secondary | ICD-10-CM | POA: Diagnosis present

## 2011-01-08 DIAGNOSIS — N320 Bladder-neck obstruction: Secondary | ICD-10-CM | POA: Diagnosis present

## 2011-01-08 DIAGNOSIS — F329 Major depressive disorder, single episode, unspecified: Secondary | ICD-10-CM | POA: Diagnosis present

## 2011-01-08 DIAGNOSIS — Z96659 Presence of unspecified artificial knee joint: Secondary | ICD-10-CM

## 2011-01-08 DIAGNOSIS — Y9210 Unspecified residential institution as the place of occurrence of the external cause: Secondary | ICD-10-CM | POA: Diagnosis not present

## 2011-01-08 DIAGNOSIS — K589 Irritable bowel syndrome without diarrhea: Secondary | ICD-10-CM | POA: Diagnosis present

## 2011-01-08 DIAGNOSIS — I10 Essential (primary) hypertension: Secondary | ICD-10-CM | POA: Diagnosis present

## 2011-01-08 DIAGNOSIS — N3289 Other specified disorders of bladder: Secondary | ICD-10-CM | POA: Diagnosis not present

## 2011-01-08 DIAGNOSIS — Y832 Surgical operation with anastomosis, bypass or graft as the cause of abnormal reaction of the patient, or of later complication, without mention of misadventure at the time of the procedure: Secondary | ICD-10-CM | POA: Diagnosis not present

## 2011-01-08 DIAGNOSIS — K219 Gastro-esophageal reflux disease without esophagitis: Secondary | ICD-10-CM | POA: Diagnosis present

## 2011-01-08 DIAGNOSIS — G5730 Lesion of lateral popliteal nerve, unspecified lower limb: Secondary | ICD-10-CM | POA: Diagnosis not present

## 2011-01-08 DIAGNOSIS — Z79899 Other long term (current) drug therapy: Secondary | ICD-10-CM

## 2011-01-08 DIAGNOSIS — K5900 Constipation, unspecified: Secondary | ICD-10-CM | POA: Diagnosis not present

## 2011-01-08 DIAGNOSIS — M4712 Other spondylosis with myelopathy, cervical region: Principal | ICD-10-CM | POA: Diagnosis present

## 2011-01-08 DIAGNOSIS — F3289 Other specified depressive episodes: Secondary | ICD-10-CM | POA: Diagnosis present

## 2011-01-08 DIAGNOSIS — G9520 Unspecified cord compression: Secondary | ICD-10-CM

## 2011-01-08 DIAGNOSIS — R7309 Other abnormal glucose: Secondary | ICD-10-CM | POA: Diagnosis not present

## 2011-01-08 DIAGNOSIS — N138 Other obstructive and reflux uropathy: Secondary | ICD-10-CM | POA: Diagnosis present

## 2011-01-08 DIAGNOSIS — R531 Weakness: Secondary | ICD-10-CM

## 2011-01-08 DIAGNOSIS — N400 Enlarged prostate without lower urinary tract symptoms: Secondary | ICD-10-CM

## 2011-01-08 DIAGNOSIS — R310 Gross hematuria: Secondary | ICD-10-CM

## 2011-01-08 DIAGNOSIS — Z7982 Long term (current) use of aspirin: Secondary | ICD-10-CM

## 2011-01-08 DIAGNOSIS — E871 Hypo-osmolality and hyponatremia: Secondary | ICD-10-CM | POA: Diagnosis not present

## 2011-01-08 DIAGNOSIS — R5381 Other malaise: Secondary | ICD-10-CM | POA: Diagnosis not present

## 2011-01-08 LAB — CBC
HCT: 41.9 % (ref 39.0–52.0)
Hemoglobin: 14.8 g/dL (ref 13.0–17.0)
MCH: 29.7 pg (ref 26.0–34.0)
MCH: 29.4 pg (ref 26.0–34.0)
MCHC: 35.3 g/dL (ref 30.0–36.0)
MCV: 83 fL (ref 78.0–100.0)
Platelets: 182 10*3/uL (ref 150–400)
RDW: 13.7 % (ref 11.5–15.5)
RDW: 13.9 % (ref 11.5–15.5)
WBC: 6.9 10*3/uL (ref 4.0–10.5)

## 2011-01-08 LAB — COMPREHENSIVE METABOLIC PANEL
ALT: 13 U/L (ref 0–53)
AST: 14 U/L (ref 0–37)
Albumin: 3.8 g/dL (ref 3.5–5.2)
Calcium: 9.8 mg/dL (ref 8.4–10.5)
GFR calc Af Amer: 81 mL/min — ABNORMAL LOW (ref >90)
Potassium: 3 mEq/L — ABNORMAL LOW (ref 3.5–5.1)
Sodium: 141 mEq/L (ref 135–145)
Total Protein: 7 g/dL (ref 6.0–8.3)

## 2011-01-08 LAB — URINALYSIS, ROUTINE W REFLEX MICROSCOPIC
Bilirubin Urine: NEGATIVE
Glucose, UA: NEGATIVE mg/dL
Hgb urine dipstick: NEGATIVE
Ketones, ur: NEGATIVE mg/dL
Leukocytes, UA: NEGATIVE
Nitrite: NEGATIVE
Protein, ur: NEGATIVE mg/dL
Specific Gravity, Urine: 1.007 (ref 1.005–1.030)
Urobilinogen, UA: 0.2 mg/dL (ref 0.0–1.0)
pH: 8 (ref 5.0–8.0)

## 2011-01-08 LAB — DIFFERENTIAL
Basophils Absolute: 0 10*3/uL (ref 0.0–0.1)
Basophils Relative: 0 % (ref 0–1)
Eosinophils Absolute: 0.1 10*3/uL (ref 0.0–0.7)
Eosinophils Relative: 2 % (ref 0–5)
Neutrophils Relative %: 66 % (ref 43–77)

## 2011-01-08 LAB — POCT I-STAT TROPONIN I: Troponin i, poc: 0 ng/mL (ref 0.00–0.08)

## 2011-01-08 MED ORDER — TAMSULOSIN HCL 0.4 MG PO CAPS
0.4000 mg | ORAL_CAPSULE | Freq: Two times a day (BID) | ORAL | Status: DC
  Administered 2011-01-08 – 2011-01-23 (×30): 0.4 mg via ORAL
  Filled 2011-01-08 (×34): qty 1

## 2011-01-08 MED ORDER — LORAZEPAM 2 MG/ML IJ SOLN
1.0000 mg | Freq: Once | INTRAMUSCULAR | Status: AC
  Administered 2011-01-23: 1 mg via INTRAVENOUS
  Filled 2011-01-08 (×2): qty 1

## 2011-01-08 MED ORDER — SODIUM CHLORIDE 0.9 % IV BOLUS (SEPSIS)
1000.0000 mL | Freq: Once | INTRAVENOUS | Status: AC
  Administered 2011-01-08: 1000 mL via INTRAVENOUS

## 2011-01-08 MED ORDER — ONDANSETRON HCL 4 MG PO TABS
4.0000 mg | ORAL_TABLET | Freq: Four times a day (QID) | ORAL | Status: DC | PRN
  Administered 2011-01-11 – 2011-01-13 (×3): 4 mg via ORAL
  Filled 2011-01-08 (×3): qty 1

## 2011-01-08 MED ORDER — PANTOPRAZOLE SODIUM 40 MG PO TBEC
40.0000 mg | DELAYED_RELEASE_TABLET | Freq: Every day | ORAL | Status: DC
  Administered 2011-01-09 – 2011-01-22 (×14): 40 mg via ORAL
  Filled 2011-01-08 (×14): qty 1

## 2011-01-08 MED ORDER — ACETAMINOPHEN 325 MG PO TABS
650.0000 mg | ORAL_TABLET | Freq: Four times a day (QID) | ORAL | Status: DC | PRN
  Administered 2011-01-24 – 2011-01-25 (×2): 650 mg via ORAL
  Filled 2011-01-08 (×2): qty 2

## 2011-01-08 MED ORDER — LORAZEPAM 2 MG/ML IJ SOLN
2.0000 mg | Freq: Once | INTRAMUSCULAR | Status: AC
  Administered 2011-01-08: 1 mg via INTRAVENOUS
  Filled 2011-01-08: qty 1

## 2011-01-08 MED ORDER — ASPIRIN EC 81 MG PO TBEC
81.0000 mg | DELAYED_RELEASE_TABLET | Freq: Every day | ORAL | Status: DC
  Administered 2011-01-08 – 2011-01-24 (×14): 81 mg via ORAL
  Filled 2011-01-08 (×17): qty 1

## 2011-01-08 MED ORDER — CETIRIZINE HCL 10 MG PO CHEW: 5.0000 mg | CHEWABLE_TABLET | Freq: Every day | ORAL | Status: DC

## 2011-01-08 MED ORDER — BETHANECHOL CHLORIDE 25 MG PO TABS
25.0000 mg | ORAL_TABLET | Freq: Two times a day (BID) | ORAL | Status: DC
  Administered 2011-01-08 – 2011-01-11 (×6): 25 mg via ORAL
  Filled 2011-01-08 (×7): qty 1

## 2011-01-08 MED ORDER — ASPIRIN 81 MG PO TBEC: 81.0000 mg | DELAYED_RELEASE_TABLET | Freq: Every day | ORAL | Status: DC

## 2011-01-08 MED ORDER — METOPROLOL SUCCINATE 12.5 MG HALF TABLET
12.5000 mg | ORAL_TABLET | Freq: Every day | ORAL | Status: DC
  Administered 2011-01-08 – 2011-01-13 (×6): 12.5 mg via ORAL
  Filled 2011-01-08 (×7): qty 1

## 2011-01-08 MED ORDER — DICYCLOMINE HCL 10 MG PO CAPS
10.0000 mg | ORAL_CAPSULE | Freq: Three times a day (TID) | ORAL | Status: DC
  Administered 2011-01-08 – 2011-01-27 (×70): 10 mg via ORAL
  Filled 2011-01-08 (×81): qty 1

## 2011-01-08 MED ORDER — ENOXAPARIN SODIUM 40 MG/0.4ML ~~LOC~~ SOLN
40.0000 mg | SUBCUTANEOUS | Status: AC
  Administered 2011-01-08 – 2011-01-14 (×6): 40 mg via SUBCUTANEOUS
  Filled 2011-01-08 (×7): qty 0.4

## 2011-01-08 MED ORDER — OXYCODONE HCL 5 MG PO TABS
5.0000 mg | ORAL_TABLET | ORAL | Status: DC | PRN
  Administered 2011-01-12 – 2011-01-23 (×13): 5 mg via ORAL
  Filled 2011-01-08 (×16): qty 1

## 2011-01-08 MED ORDER — ONDANSETRON HCL 4 MG/2ML IJ SOLN: 4.0000 mg | Freq: Four times a day (QID) | INTRAMUSCULAR | Status: DC | PRN

## 2011-01-08 MED ORDER — SIMVASTATIN 20 MG PO TABS
20.0000 mg | ORAL_TABLET | Freq: Every day | ORAL | Status: DC
  Administered 2011-01-09 – 2011-01-23 (×15): 20 mg via ORAL
  Filled 2011-01-08 (×16): qty 1

## 2011-01-08 MED ORDER — LORATADINE 10 MG PO TABS
10.0000 mg | ORAL_TABLET | Freq: Every day | ORAL | Status: DC
  Administered 2011-01-09 – 2011-01-12 (×4): 10 mg via ORAL
  Filled 2011-01-08 (×4): qty 1

## 2011-01-08 MED ORDER — ACETAMINOPHEN 650 MG RE SUPP: 650.0000 mg | Freq: Four times a day (QID) | RECTAL | Status: DC | PRN

## 2011-01-08 MED ORDER — CITALOPRAM HYDROBROMIDE 10 MG PO TABS
10.0000 mg | ORAL_TABLET | Freq: Every day | ORAL | Status: DC
  Administered 2011-01-08 – 2011-01-23 (×15): 10 mg via ORAL
  Filled 2011-01-08 (×18): qty 1

## 2011-01-08 MED ORDER — GADOBENATE DIMEGLUMINE 529 MG/ML IV SOLN
17.0000 mL | Freq: Once | INTRAVENOUS | Status: AC | PRN
  Administered 2011-01-08: 17 mL via INTRAVENOUS

## 2011-01-08 NOTE — ED Provider Notes (Signed)
Evaluation and management procedures were performed by the PA/NP under my supervision/collaboration.   Ricka Westra D Ramir Malerba, MD 01/08/11 1725 

## 2011-01-08 NOTE — H&P (Signed)
Chief Complaint: Bilateral Lower Extremity Weakness HPI: Ricky Bradley is an 77 y.o. male who presents with bilateral lower extremity weakness.  The patient notes that he had similar symptoms in may of this year when he was seen by dr. Lovell Sheehan of Orthopaedics and was noted to have a lumbar spinal stenosis and underwent laminectomy.  He was better for some time but then in the last several weeks he began to have lower extremity weakness and numbness and what he describes as a band of pressure across his abdomen below the level of the umbilicus.  The patient states he had been unable to get out of bed for about 3-4 days.  He has BLE weakness but stated that the right was greater than the left and that while he can feel things he states he has a numb feeling and cannot describe it any further than that.  He denies andy Fevers, chills, night sweats, chest pain, palpitations, shortness of breath, nausea, vomiting, diarrhea or constipation but does state that he has had some weight loss over the last several months due to what he states is IBS.  The patient has had no other complaints and does not have pain at this time.  In the ED patient did have MRI of lumbar spine which was negative.  PMH:  Past Medical History  Diagnosis Date  . Hypertension   . GERD (gastroesophageal reflux disease)   . High cholesterol   . Enlarged prostate   . High cholesterol    PSH:  Past Surgical History  Procedure Date  . Joint replacement 2001  and 2002    both knees  . Laminectomy 08/2010  . Back surgery   . Replacement total knee   . Cholecystectomy 11/01/10     FAMILY HX: History reviewed. No pertinent family history. SOCIAL HX:  reports that he has quit smoking. He does not have any smokeless tobacco history on file. He reports that he does not drink alcohol or use illicit drugs.  ALL:  Allergies  Allergen Reactions  . Vicodin (Hydrocodone-Acetaminophen) Hives    On neck and chest.    MEDS:  Medications  Prior to Admission  Medication Dose Route Frequency Provider Last Rate Last Dose  . acetaminophen (TYLENOL) tablet 650 mg  650 mg Oral Q6H PRN Skip Estimable       Or  . acetaminophen (TYLENOL) suppository 650 mg  650 mg Rectal Q6H PRN Skip Estimable      . aspirin EC tablet 81 mg  81 mg Oral Daily Skip Estimable      . bethanechol (URECHOLINE) tablet 25 mg  25 mg Oral BID Skip Estimable      . cetirizine (ZYRTEC) chewable tablet 5 mg  5 mg Oral QHS Skip Estimable      . citalopram (CELEXA) tablet 10 mg  10 mg Oral Daily Skip Estimable      . dicyclomine (BENTYL) capsule 10 mg  10 mg Oral TID AC & HS Skip Estimable      . enoxaparin (LOVENOX) injection 40 mg  40 mg Subcutaneous Q24H Skip Estimable      . gadobenate dimeglumine (MULTIHANCE) injection 17 mL  17 mL Intravenous Once PRN Medication Radiologist   17 mL at 01/08/11 1722  . LORazepam (ATIVAN) injection 1 mg  1 mg Intravenous Once Skip Estimable      . LORazepam (ATIVAN) injection 2 mg  2 mg Intravenous Once Thomasene Lot, [place]  1 mg at 01/08/11 1427  . metoprolol succinate (TOPROL-XL) 24 hr tablet 12.5 mg  12.5 mg Oral Daily Skip Estimable      . ondansetron (ZOFRAN) tablet 4 mg  4 mg Oral Q6H PRN Skip Estimable       Or  . ondansetron [facility]) injection 4 mg  4 mg Intravenous Q6H PRN Skip Estimable      . oxyCODONE (Oxy IR/ROXICODONE) immediate release tablet 5 mg  5 mg Oral Q4H PRN Skip Estimable      . pantoprazole (PROTONIX) EC tablet 40 mg  40 mg Oral Daily Jacqulyn Ducking. Connell Bognar      . simvastatin (ZOCOR) tablet 20 mg  20 mg Oral q1800 Skip Estimable      . sodium chloride 0.9 % bolus 1,000 mL  1,000 mL Intravenous Once Thomasene Lot, PA   1,000 mL at 01/08/11 1156  . Tamsulosin HCl (FLOMAX) capsule 0.4 mg  0.4 mg Oral BID Skip Estimable       Medications Prior to Admission  Medication Sig Dispense Refill  . aspirin 81 MG EC tablet Take  81 mg by mouth daily. Stop taking before surgery      . bethanechol (URECHOLINE) 25 MG tablet Take 25 mg by mouth 2 (two) times daily.        . Cetirizine HCl (ZYRTEC PO) Take by mouth daily.        Marland Kitchen lovastatin (MEVACOR) 40 MG tablet Take 40 mg by mouth at bedtime.        . metoprolol succinate (TOPROL-XL) 25 MG 24 hr tablet Take 12.5 mg by mouth daily.        Marland Kitchen NEXIUM 40 MG capsule Take 40 mg by mouth daily before breakfast.       . Tamsulosin HCl (FLOMAX) 0.4 MG CAPS Take 0.4 mg by mouth 2 (two) times daily.         Review of Systems:  12 point review of systems was reviewed and negative with exceptions in the HPI.   Physcial Exam  Blood pressure 136/113, pulse 68, temperature 97.8 F (36.6 C), temperature source Oral, resp. rate 16, SpO2 99.00%. GEN: A+Ox3, NAD HEENT: PERRL, EOMI, MMM, oropharynx clear without erythema or exudates NECK: Supple, no JVD, no Thyromegally, no Lymphadenopathy CV: Normal Rate, Regular Rhythm, Nl S1/S2, no murmurs, rubs, or gallops CHEST: CTAB, no w/r/r ABD: NABS, S/NT/ND, no hepatosplenomegally, no masses EXTR: no c/c/e SKIN: no rashes or ulcerations NEURO: CN2-12 intact, Strength in the BLE is reduced at ~4 in the right leg and 4.5 in the left leg.  Upgoing toes bilaterally, MS normal.  LABS:  Results for orders placed during the hospital encounter of 01/08/11 (from the past 48 hour(s))  CBC     Status: Normal   Collection Time   01/08/11 11:46 AM      Component Value Range Comment   WBC 6.9  4.0 - 10.5 (K/uL)    RBC 5.18  4.22 - 5.81 (MIL/uL)    Hemoglobin 15.4  13.0 - 17.0 (g/dL)    HCT 40.9  81.1 - 91.4 (%)    MCV 83.0  78.0 - 100.0 (fL)    MCH 29.7  26.0 - 34.0 (pg)    MCHC 35.8  30.0 - 36.0 (g/dL)    RDW 78.2  95.6 - 21.3 (%)    Platelets 182  150 - 400 (K/uL)   DIFFERENTIAL     Status: Normal   Collection Time  01/08/11 11:46 AM      Component Value Range Comment   Neutrophils Relative 66  43 - 77 (%)    Neutro Abs 4.6  1.7 - 7.7  (K/uL)    Lymphocytes Relative 22  12 - 46 (%)    Lymphs Abs 1.5  0.7 - 4.0 (K/uL)    Monocytes Relative 10  3 - 12 (%)    Monocytes Absolute 0.7  0.1 - 1.0 (K/uL)    Eosinophils Relative 2  0 - 5 (%)    Eosinophils Absolute 0.1  0.0 - 0.7 (K/uL)    Basophils Relative 0  0 - 1 (%)    Basophils Absolute 0.0  0.0 - 0.1 (K/uL)   COMPREHENSIVE METABOLIC PANEL     Status: Abnormal   Collection Time   01/08/11 11:46 AM      Component Value Range Comment   Sodium 141  135 - 145 (mEq/L)    Potassium 3.0 (*) 3.5 - 5.1 (mEq/L)    Chloride 103  96 - 112 (mEq/L)    CO2 26  19 - 32 (mEq/L)    Glucose, Bld 111 (*) 70 - 99 (mg/dL)    BUN 12  6 - 23 (mg/dL)    Creatinine, Ser 1.61  0.50 - 1.35 (mg/dL)    Calcium 9.8  8.4 - 10.5 (mg/dL)    Total Protein 7.0  6.0 - 8.3 (g/dL)    Albumin 3.8  3.5 - 5.2 (g/dL)    AST 14  0 - 37 (U/L)    ALT 13  0 - 53 (U/L)    Alkaline Phosphatase 105  39 - 117 (U/L)    Total Bilirubin 0.6  0.3 - 1.2 (mg/dL)    GFR calc non Af Amer 70 (*) >90 (mL/min)    GFR calc Af Amer 81 (*) >90 (mL/min)   URINALYSIS, ROUTINE W REFLEX MICROSCOPIC     Status: Normal   Collection Time   01/08/11 11:50 AM      Component Value Range Comment   Color, Urine YELLOW  YELLOW     Appearance CLEAR  CLEAR     Specific Gravity, Urine 1.007  1.005 - 1.030     pH 8.0  5.0 - 8.0     Glucose, UA NEGATIVE  NEGATIVE (mg/dL)    Hgb urine dipstick NEGATIVE  NEGATIVE     Bilirubin Urine NEGATIVE  NEGATIVE     Ketones, ur NEGATIVE  NEGATIVE (mg/dL)    Protein, ur NEGATIVE  NEGATIVE (mg/dL)    Urobilinogen, UA 0.2  0.0 - 1.0 (mg/dL)    Nitrite NEGATIVE  NEGATIVE     Leukocytes, UA NEGATIVE  NEGATIVE  MICROSCOPIC NOT DONE ON URINES WITH NEGATIVE PROTEIN, BLOOD, LEUKOCYTES, NITRITE, OR GLUCOSE <1000 mg/dL.  POCT I-STAT TROPONIN I     Status: Normal   Collection Time   01/08/11 11:58 AM      Component Value Range Comment   Troponin i, poc 0.00  0.00 - 0.08 (ng/mL)    Comment 3               RADIOLOGY:  Dg Chest 2 View  01/08/2011  *RADIOLOGY REPORT*  Clinical Data: Numbness in both legs since yesterday.  Weakness.  CHEST - 2 VIEW  Comparison: CT 11/02/2010.  Plain films 08/18/2010.  Findings: Lateral view degraded by patient arm position.  Mild thoracic spondylosis.  Apical lordotic frontal view. Midline trachea.  Normal heart size and mediastinal contours. No pleural effusion  or pneumothorax.  Clear lungs.  IMPRESSION: Normal chest.  Original Report Authenticated By: Consuello Bossier, M.D.   Ct Head Wo Contrast  01/08/2011  *RADIOLOGY REPORT*  Clinical Data: Bilateral leg weakness  CT HEAD WITHOUT CONTRAST  Technique:  Contiguous axial images were obtained from the base of the skull through the vertex without contrast.  Comparison: 01/27/2007  Findings: Mild atrophy. There is no evidence of acute intracranial hemorrhage, brain edema, mass lesion, acute infarction,   mass effect, or midline shift. Acute infarct may be inapparent on noncontrast CT.  No other intra-axial abnormalities are seen, and the ventricles and sulci are within normal limits in size and symmetry.   No abnormal extra-axial fluid collections or masses are identified.  No significant calvarial abnormality.  IMPRESSION: 1. Negative for bleed or other acute intracranial process.  Original Report Authenticated By: Osa Craver, M.D.   Mr Lumbar Spine W Wo Contrast  01/08/2011  *RADIOLOGY REPORT*  Clinical Data: Bilateral lower extremity weakness.  History of prior lumbar spine surgery.  Progressive worsening weakness in the lower extremities.  MRI LUMBAR SPINE WITHOUT AND WITH CONTRAST  Technique:  Multiplanar and multiecho pulse sequences of the lumbar spine were obtained without and with intravenous contrast.  Contrast:  17 ml Multihance.  Comparison: MRI lumbar spine 09/14/2010.  Findings: Numbering convention used on prior exam preserved. Vertebral body height and marrow signal is within normal limits.  Paraspinal soft tissues are within normal limits.  Distention of the urinary bladder is present.  No destructive osseous lesions. Postoperative changes of L4 laminectomy.  The small fluid collection in the operative bed seen on the recent comparison has resolved.  T12-L1:  Negative.  L1-L2:  Shallow posterior disc bulging without stenosis.  Mild facet hypertrophy and ligamentum flavum redundancy.  L2-L3:  Mild facet hypertrophy.  No stenosis.  L3-L4:  Bilateral facet hypertrophy and ligamentum flavum redundancy with mild central stenosis.  Unchanged mild symmetric bilateral foraminal stenosis.  L4-L5:  Laminectomy with wide posterior decompression.  Central canal and lateral recesses are patent.  The enhancing granulation tissue is present in the operative bed and the epidural space.  No epidural abscess or fluid collection.  Short pedicles are present with mild left foraminal stenosis.  Facet hypertrophy is present.  L5-S1:  Disc desiccation and degeneration.  Degenerative endplate changes are present.  Central canal and lateral recesses are patent.  Mild left foraminal stenosis associated with endplate spurring and facet hypertrophy.  The left greater than right facet degeneration/hypertrophy.  IMPRESSION:  1.  Evolving postoperative changes of L4 laminectomy with good decompression of the thecal sac and lateral recesses.  Mild left foraminal stenosis associated with short pedicles and facet hypertrophy potentially affecting the left L4 nerve. Resolution of small fluid collection in the operative bed.  Expected enhancing granulation tissue in the operative bed. 2.  Unchanged L5-S1 degenerative disease with mild left foraminal stenosis secondary endplate spurring and facet hypertrophy. 3.  Unchanged L3-L4 facet arthrosis with mild central stenosis.  Original Report Authenticated By: Andreas Newport, M.D.    Assessment/Plan 1. BLE weakness: the patient underwent MRI of lumbar spine which was negative.   Unfortunately, the patient does have Upper motor neuron sign of up going bilateral toes and I suspect there may be a stenosis of the cervical or thoracic spinal canal leading to compressive type symptoms.  I will order MRI of the C and T spine given this.  Will consult with his orthopaedic surgeon for further assistance  as well.  I do not see any reason that this would be an intracranial process given the bilateral nature of his disease.  There is a possibility given his enlarged prostate history that he may have cancer and symptoms from that but MRI will assist in ruling this out. 2. HTN: will continue the patient on his home metoprolol 3. Weight loss and IBS: will give patient diet he is comfortable with (no gluten and no dairy) and continue home bentyl and citalopram. 4. HLD: continue statin 5. GERD: continue PPI 6. BPH/bladder spasms: will continue patient on bethanechol and Flomax. 7. FEN/PPX: lovenox for DVT Prophylaxis, diet to be gluten and dairy free at patient request and replete lytes PRN. 8. CODE: FULL CODE. 4.   Dawson Hollman W. 01/08/2011, 6:20 PM

## 2011-01-08 NOTE — ED Notes (Signed)
Attempted to call report to floor - advised to call back in approx 5 min.

## 2011-01-08 NOTE — ED Provider Notes (Signed)
The patient is a 77 year old male with recent history of surgeries x2, in July a back surgery, and in September, a cholecystectomy. He presents for evaluation of progressive and worsening weakness in his lower extremities affecting his ability to walk. He also reports a bandlike sensation around his abdomen with lower abdominal discomfort. On evaluation, he is awake, alert, and oriented in no apparent distress, and has no tenderness to palpation of the lumbar spine. His abdomen is soft to palpation without guarding or rebound tenderness, but with mild tenderness to palpation at the lower abdomen. I have evaluated this patient face-to-face.  Evaluation and management procedures were performed by the PA/NP under my supervision/collaboration.    Felisa Bonier, MD 01/08/11 1346

## 2011-01-08 NOTE — ED Notes (Signed)
Patient is resting comfortably. 

## 2011-01-08 NOTE — ED Notes (Signed)
Vital signs stable. 

## 2011-01-08 NOTE — ED Notes (Signed)
Patient transported to MRI 

## 2011-01-08 NOTE — ED Notes (Signed)
Tried to call report - told nurse was tied up at present - will attempt to call later.

## 2011-01-08 NOTE — ED Notes (Signed)
Family at bedside. 

## 2011-01-08 NOTE — ED Notes (Signed)
MD at bedside. 

## 2011-01-08 NOTE — ED Notes (Signed)
Pt arrived by [facility], reports having back surgery in July. Having bilateral leg weakness x 1 week, pt is normally ambulatory at home but unable to ambulate now due to weakness. Feeling a tightness around pelvic area that started today.

## 2011-01-08 NOTE — ED Notes (Signed)
Returned from xray

## 2011-01-08 NOTE — ED Provider Notes (Signed)
History     CSN: [id number] Arrival date & time: 01/08/2011 10:45 AM   First MD Initiated Contact with Patient 01/08/11 1101     HPI Patient reports back surgery in July. States he was having gradual improvement in ambulation. However in the last month has been feeling BLE weakness. States in the last week has been unable to walk at all due to extreme weakness of bilateral lower extremities. Reports symptoms associated with urinary urgency, numbness, tingling. Also reports since pain has had chronic abdominal pain. Daughter states his doctors feel he may have IBS now. Better still continuing evaluation of abdominal pain. Denies nausea, vomiting. Patient is a 77 y.o. male presenting with extremity weakness. The history is provided by the patient.  Extremity Weakness The current episode started in the past 7 days. The problem occurs constantly. The problem has been gradually worsening. Associated symptoms include abdominal pain, numbness and weakness. Pertinent negatives include no chest pain, diaphoresis, headaches, joint swelling, myalgias, nausea, neck pain, rash or vomiting. The symptoms are aggravated by walking and standing. He has tried nothing for the symptoms.    Past Medical History  Diagnosis Date  . Hypertension   . GERD (gastroesophageal reflux disease)   . High cholesterol   . Enlarged prostate   . High cholesterol     Past Surgical History  Procedure Date  . Joint replacement 2001  and 2002    both knees  . Laminectomy 08/2010  . Back surgery   . Replacement total knee   . Cholecystectomy 11/01/10    History reviewed. No pertinent family history.  History  Substance Use Topics  . Smoking status: Former Games developer  . Smokeless tobacco: Not on file  . Alcohol Use: No      Review of Systems  Constitutional: Positive for appetite change. Negative for diaphoresis.  HENT: Negative for neck pain.   Cardiovascular: Negative for chest pain.  Gastrointestinal: Positive  for abdominal pain. Negative for nausea and vomiting.  Musculoskeletal: Positive for gait problem and extremity weakness. Negative for myalgias and joint swelling.  Skin: Negative for rash.  Neurological: Positive for weakness and numbness. Negative for dizziness, tremors, speech difficulty, light-headedness and headaches.  All other systems reviewed and are negative.    Allergies  Vicodin  Home Medications   Current Outpatient Rx  Name Route Sig Dispense Refill  . ASPIRIN 81 MG PO TBEC Oral Take 81 mg by mouth daily. Stop taking before surgery    . BETHANECHOL CHLORIDE 25 MG PO TABS Oral Take 25 mg by mouth 2 (two) times daily.      Marland Kitchen ZYRTEC PO Oral Take by mouth daily.      Marland Kitchen LOVASTATIN 40 MG PO TABS Oral Take 40 mg by mouth at bedtime.      Marland Kitchen METOPROLOL SUCCINATE 25 MG PO TB24 Oral Take 12.5 mg by mouth daily.      Marland Kitchen METOPROLOL TARTRATE 25 MG PO TABS      . NEXIUM 40 MG PO CPDR Oral Take 40 mg by mouth daily before breakfast.     . TAMSULOSIN HCL 0.4 MG PO CAPS Oral Take 0.4 mg by mouth 2 (two) times daily.     Marland Kitchen NITROFURANTOIN MONOHYD MACRO 100 MG PO CAPS        BP 159/90  Pulse 70  Temp(Src) 97.8 F (36.6 C) (Oral)  Resp 18  SpO2 100%  Physical Exam  Vitals reviewed. Constitutional: He is oriented to person, place, and time. He appears  well-developed and well-nourished.  HENT:  Head: Normocephalic and atraumatic.  Eyes: Conjunctivae are normal. Pupils are equal, round, and reactive to light.  Neck: Normal range of motion. Neck supple.  Cardiovascular: Normal rate, regular rhythm and normal heart sounds.   Pulmonary/Chest: Effort normal and breath sounds normal.  Abdominal: Soft. Bowel sounds are normal. He exhibits no distension and no mass. There is no tenderness. There is no rebound and no guarding.  Genitourinary: Rectal exam shows no tenderness and anal tone normal.  Neurological: He is alert and oriented to person, place, and time. No cranial nerve deficit.        Patient with a BLE weakness. Difficulty with standing despite assistance. Able to lift lower extremities briefly while laying at approximately 10. Decreased sensation of bilateral lower extremities with soft touch. Normal distal pulses, and cap refill.  Skin: Skin is warm and dry. No rash noted. No erythema. No pallor.  Psychiatric: He has a normal mood and affect. His behavior is normal.    ED Course  Procedures  Results for orders placed during the hospital encounter of 01/08/11  CBC      Component Value Range   WBC 6.9  4.0 - 10.5 (K/uL)   RBC 5.18  4.22 - 5.81 (MIL/uL)   Hemoglobin 15.4  13.0 - 17.0 (g/dL)   HCT 91.4  78.2 - 95.6 (%)   MCV 83.0  78.0 - 100.0 (fL)   MCH 29.7  26.0 - 34.0 (pg)   MCHC 35.8  30.0 - 36.0 (g/dL)   RDW 21.3  08.6 - 57.8 (%)   Platelets 182  150 - 400 (K/uL)  DIFFERENTIAL      Component Value Range   Neutrophils Relative 66  43 - 77 (%)   Neutro Abs 4.6  1.7 - 7.7 (K/uL)   Lymphocytes Relative 22  12 - 46 (%)   Lymphs Abs 1.5  0.7 - 4.0 (K/uL)   Monocytes Relative 10  3 - 12 (%)   Monocytes Absolute 0.7  0.1 - 1.0 (K/uL)   Eosinophils Relative 2  0 - 5 (%)   Eosinophils Absolute 0.1  0.0 - 0.7 (K/uL)   Basophils Relative 0  0 - 1 (%)   Basophils Absolute 0.0  0.0 - 0.1 (K/uL)  COMPREHENSIVE METABOLIC PANEL      Component Value Range   Sodium 141  135 - 145 (mEq/L)   Potassium 3.0 (*) 3.5 - 5.1 (mEq/L)   Chloride 103  96 - 112 (mEq/L)   CO2 26  19 - 32 (mEq/L)   Glucose, Bld 111 (*) 70 - 99 (mg/dL)   BUN 12  6 - 23 (mg/dL)   Creatinine, Ser 4.69  0.50 - 1.35 (mg/dL)   Calcium 9.8  8.4 - 62.9 (mg/dL)   Total Protein 7.0  6.0 - 8.3 (g/dL)   Albumin 3.8  3.5 - 5.2 (g/dL)   AST 14  0 - 37 (U/L)   ALT 13  0 - 53 (U/L)   Alkaline Phosphatase 105  39 - 117 (U/L)   Total Bilirubin 0.6  0.3 - 1.2 (mg/dL)   GFR calc non Af Amer 70 (*) >90 (mL/min)   GFR calc Af Amer 81 (*) >90 (mL/min)  URINALYSIS, ROUTINE W REFLEX MICROSCOPIC      Component  Value Range   Color, Urine YELLOW  YELLOW    Appearance CLEAR  CLEAR    Specific Gravity, Urine 1.007  1.005 - 1.030    pH 8.0  5.0 - 8.0    Glucose, UA NEGATIVE  NEGATIVE (mg/dL)   Hgb urine dipstick NEGATIVE  NEGATIVE    Bilirubin Urine NEGATIVE  NEGATIVE    Ketones, ur NEGATIVE  NEGATIVE (mg/dL)   Protein, ur NEGATIVE  NEGATIVE (mg/dL)   Urobilinogen, UA 0.2  0.0 - 1.0 (mg/dL)   Nitrite NEGATIVE  NEGATIVE    Leukocytes, UA NEGATIVE  NEGATIVE   POCT I-STAT TROPONIN I      Component Value Range   Troponin i, poc 0.00  0.00 - 0.08 (ng/mL)   Comment 3             Dg Chest 2 View  01/08/2011  *RADIOLOGY REPORT*  Clinical Data: Numbness in both legs since yesterday.  Weakness.  CHEST - 2 VIEW  Comparison: CT 11/02/2010.  Plain films 08/18/2010.  Findings: Lateral view degraded by patient arm position.  Mild thoracic spondylosis.  Apical lordotic frontal view. Midline trachea.  Normal heart size and mediastinal contours. No pleural effusion or pneumothorax.  Clear lungs.  IMPRESSION: Normal chest.  Original Report Authenticated By: Consuello Bossier, M.D.   Ct Head Wo Contrast  01/08/2011  *RADIOLOGY REPORT*  Clinical Data: Bilateral leg weakness  CT HEAD WITHOUT CONTRAST  Technique:  Contiguous axial images were obtained from the base of the skull through the vertex without contrast.  Comparison: 01/27/2007  Findings: Mild atrophy. There is no evidence of acute intracranial hemorrhage, brain edema, mass lesion, acute infarction,   mass effect, or midline shift. Acute infarct may be inapparent on noncontrast CT.  No other intra-axial abnormalities are seen, and the ventricles and sulci are within normal limits in size and symmetry.   No abnormal extra-axial fluid collections or masses are identified.  No significant calvarial abnormality.  IMPRESSION: 1. Negative for bleed or other acute intracranial process.  Original Report Authenticated By: Osa Craver, M.D.    MDM   3:35 PM Move  to CDU Holding, pending MRI results. If normal, pt will still require admission to general medicine since he is unable to ambulate. Discussed sign out with Marlon Pel PA-C in CDU      Thomasene Lot, [place] 01/08/11 1538

## 2011-01-09 ENCOUNTER — Inpatient Hospital Stay (HOSPITAL_COMMUNITY): Payer: Medicare Other

## 2011-01-09 DIAGNOSIS — K219 Gastro-esophageal reflux disease without esophagitis: Secondary | ICD-10-CM | POA: Diagnosis present

## 2011-01-09 DIAGNOSIS — E785 Hyperlipidemia, unspecified: Secondary | ICD-10-CM | POA: Diagnosis present

## 2011-01-09 DIAGNOSIS — N400 Enlarged prostate without lower urinary tract symptoms: Secondary | ICD-10-CM | POA: Diagnosis present

## 2011-01-09 DIAGNOSIS — I10 Essential (primary) hypertension: Secondary | ICD-10-CM | POA: Diagnosis present

## 2011-01-09 DIAGNOSIS — M48062 Spinal stenosis, lumbar region with neurogenic claudication: Secondary | ICD-10-CM | POA: Clinically undetermined

## 2011-01-09 LAB — CBC
HCT: 40.9 % (ref 39.0–52.0)
Hemoglobin: 13.9 g/dL (ref 13.0–17.0)
MCH: 28.7 pg (ref 26.0–34.0)
MCHC: 34 g/dL (ref 30.0–36.0)
MCV: 84.3 fL (ref 78.0–100.0)
RDW: 14.2 % (ref 11.5–15.5)

## 2011-01-09 LAB — BASIC METABOLIC PANEL
BUN: 14 mg/dL (ref 6–23)
Chloride: 110 mEq/L (ref 96–112)
Creatinine, Ser: 0.95 mg/dL (ref 0.50–1.35)
Glucose, Bld: 97 mg/dL (ref 70–99)
Potassium: 3.6 mEq/L (ref 3.5–5.1)

## 2011-01-09 MED ORDER — DEXAMETHASONE SODIUM PHOSPHATE 4 MG/ML IJ SOLN: 8.0000 mg | Freq: Four times a day (QID) | INTRAMUSCULAR | Status: DC

## 2011-01-09 MED ORDER — LORAZEPAM 2 MG/ML IJ SOLN
1.0000 mg | Freq: Once | INTRAMUSCULAR | Status: AC
  Administered 2011-01-09: 1 mg via INTRAVENOUS

## 2011-01-09 MED ORDER — LORAZEPAM 2 MG/ML IJ SOLN
INTRAMUSCULAR | Status: AC
  Filled 2011-01-09: qty 1

## 2011-01-09 MED ORDER — DEXAMETHASONE SODIUM PHOSPHATE 4 MG/ML IJ SOLN
8.0000 mg | Freq: Four times a day (QID) | INTRAMUSCULAR | Status: DC
  Administered 2011-01-09 – 2011-01-11 (×8): 8 mg via INTRAVENOUS
  Filled 2011-01-09 (×12): qty 2

## 2011-01-09 MED ORDER — GADOBENATE DIMEGLUMINE 529 MG/ML IV SOLN
15.0000 mL | Freq: Once | INTRAVENOUS | Status: AC
  Administered 2011-01-09: 15 mL via INTRAVENOUS

## 2011-01-09 MED ORDER — DEXAMETHASONE SODIUM PHOSPHATE 4 MG/ML IJ SOLN
8.0000 mg | Freq: Four times a day (QID) | INTRAMUSCULAR | Status: DC
  Filled 2011-01-09 (×3): qty 2

## 2011-01-09 MED ORDER — DEXAMETHASONE SODIUM PHOSPHATE 4 MG/ML IJ SOLN
6.0000 mg | Freq: Four times a day (QID) | INTRAMUSCULAR | Status: DC
  Filled 2011-01-09 (×3): qty 1.5

## 2011-01-09 NOTE — Consult Note (Signed)
Reason for Consult: Quadraparesis Referring Physician: The hospitalist  Ricky Bradley is an 77 y.o. male.  HPI: The patient is a 77 year old white male who I performed a decompressive lumbar laminectomy on 08/24/2010. The surgery went well and his postoperative course was initially unremarkable. The patient had some GI problems and saw his gastroenterologist and was worked up for this. The patient has been generally weak over the last several weeks.  The patient has been unable to ambulate over last several days. He came to the emergency department yesterday and was worked up with a lumbar MRI which turned out okay. He was admitted by the hospitalist for further workup. The workup included a thoracic MRI which was unremarkable. He also had a cervical MRI which demonstrated severe neurocompression at C6-7. A neurosurgical consultation was requested.  Ricky Bradley the patient is accompanied by his daughter. He complains of numbness in his trunk and lower extremities. He says his legs are weak and give out on him and he can't walk. He notices at times his legs "jump".  Past Medical History  Diagnosis Date  . Hypertension   . GERD (gastroesophageal reflux disease)   . High cholesterol   . Enlarged prostate   . High cholesterol     Past Surgical History  Procedure Date  . Joint replacement 2001  and 2002    both knees  . Laminectomy 08/2010  . Back surgery   . Replacement total knee   . Cholecystectomy 11/01/10    History reviewed. No pertinent family history.  Social History:  reports that he has quit smoking. He does not have any smokeless tobacco history on file. He reports that he does not drink alcohol or use illicit drugs.  Allergies:  Allergies  Allergen Reactions  . Vicodin (Hydrocodone-Acetaminophen) Hives    On neck and chest.    Medications:  Prior to Admission:  Prescriptions prior to admission  Medication Sig Dispense Refill  . aspirin 81 MG EC tablet Take 81 mg by mouth  daily. Stop taking before surgery      . bethanechol (URECHOLINE) 25 MG tablet Take 25 mg by mouth 2 (two) times daily.        . Cetirizine HCl (ZYRTEC PO) Take by mouth daily.        . citalopram (CELEXA) 10 MG tablet Take 10 mg by mouth daily.        Marland Kitchen dicyclomine (BENTYL) 10 MG capsule Take 10 mg by mouth 4 (four) times daily -  before meals and at bedtime.        . lovastatin (MEVACOR) 40 MG tablet Take 40 mg by mouth at bedtime.        . metoprolol succinate (TOPROL-XL) 25 MG 24 hr tablet Take 12.5 mg by mouth daily.        Marland Kitchen NEXIUM 40 MG capsule Take 40 mg by mouth daily before breakfast.       . oxyCODONE (OXY IR/ROXICODONE) 5 MG immediate release tablet Take 5 mg by mouth every 4 (four) hours as needed.        . Tamsulosin HCl (FLOMAX) 0.4 MG CAPS Take 0.4 mg by mouth 2 (two) times daily.        Scheduled:   . aspirin EC  81 mg Oral Daily  . bethanechol  25 mg Oral BID  . citalopram  10 mg Oral Daily  . dexamethasone  8 mg Intravenous Q6H  . dicyclomine  10 mg Oral TID AC & HS  .  enoxaparin  40 mg Subcutaneous Q24H  . gadobenate dimeglumine  15 mL Intravenous Once  . loratadine  10 mg Oral Daily  . LORazepam      . LORazepam  1 mg Intravenous Once  . LORazepam  1 mg Intravenous Once  . LORazepam  1 mg Intravenous Once  . metoprolol succinate  12.5 mg Oral Daily  . pantoprazole  40 mg Oral Daily  . simvastatin  20 mg Oral q1800  . Tamsulosin HCl  0.4 mg Oral BID  . DISCONTD: aspirin  81 mg Oral Daily  . DISCONTD: cetirizine  5 mg Oral QHS  . DISCONTD: dexamethasone  6 mg Intravenous Q6H  . DISCONTD: dexamethasone  8 mg Intravenous Q6H  . DISCONTD: dexamethasone  8 mg Intravenous Q6H   Continuous:  ZOX:[id number], acetaminophen, ondansetron (ZOFRAN) IV, ondansetron, oxyCODONE Anti-infectives    None      Results for orders placed during the hospital encounter of 01/08/11 (from the past 48 hour(s))  CBC     Status: Normal   Collection Time   01/08/11 11:46 AM       Component Value Range Comment   WBC 6.9  4.0 - 10.5 (K/uL)    RBC 5.18  4.22 - 5.81 (MIL/uL)    Hemoglobin 15.4  13.0 - 17.0 (g/dL)    HCT 14.7  82.9 - 56.2 (%)    MCV 83.0  78.0 - 100.0 (fL)    MCH 29.7  26.0 - 34.0 (pg)    MCHC 35.8  30.0 - 36.0 (g/dL)    RDW 13.0  86.5 - 78.4 (%)    Platelets 182  150 - 400 (K/uL)   DIFFERENTIAL     Status: Normal   Collection Time   01/08/11 11:46 AM      Component Value Range Comment   Neutrophils Relative 66  43 - 77 (%)    Neutro Abs 4.6  1.7 - 7.7 (K/uL)    Lymphocytes Relative 22  12 - 46 (%)    Lymphs Abs 1.5  0.7 - 4.0 (K/uL)    Monocytes Relative 10  3 - 12 (%)    Monocytes Absolute 0.7  0.1 - 1.0 (K/uL)    Eosinophils Relative 2  0 - 5 (%)    Eosinophils Absolute 0.1  0.0 - 0.7 (K/uL)    Basophils Relative 0  0 - 1 (%)    Basophils Absolute 0.0  0.0 - 0.1 (K/uL)   COMPREHENSIVE METABOLIC PANEL     Status: Abnormal   Collection Time   01/08/11 11:46 AM      Component Value Range Comment   Sodium 141  135 - 145 (mEq/L)    Potassium 3.0 (*) 3.5 - 5.1 (mEq/L)    Chloride 103  96 - 112 (mEq/L)    CO2 26  19 - 32 (mEq/L)    Glucose, Bld 111 (*) 70 - 99 (mg/dL)    BUN 12  6 - 23 (mg/dL)    Creatinine, Ser 6.96  0.50 - 1.35 (mg/dL)    Calcium 9.8  8.4 - 10.5 (mg/dL)    Total Protein 7.0  6.0 - 8.3 (g/dL)    Albumin 3.8  3.5 - 5.2 (g/dL)    AST 14  0 - 37 (U/L)    ALT 13  0 - 53 (U/L)    Alkaline Phosphatase 105  39 - 117 (U/L)    Total Bilirubin 0.6  0.3 - 1.2 (mg/dL)    GFR calc non  Af Amer 70 (*) >90 (mL/min)    GFR calc Af Amer 81 (*) >90 (mL/min)   URINALYSIS, ROUTINE W REFLEX MICROSCOPIC     Status: Normal   Collection Time   01/08/11 11:50 AM      Component Value Range Comment   Color, Urine YELLOW  YELLOW     Appearance CLEAR  CLEAR     Specific Gravity, Urine 1.007  1.005 - 1.030     pH 8.0  5.0 - 8.0     Glucose, UA NEGATIVE  NEGATIVE (mg/dL)    Hgb urine dipstick NEGATIVE  NEGATIVE     Bilirubin Urine NEGATIVE   NEGATIVE     Ketones, ur NEGATIVE  NEGATIVE (mg/dL)    Protein, ur NEGATIVE  NEGATIVE (mg/dL)    Urobilinogen, UA 0.2  0.0 - 1.0 (mg/dL)    Nitrite NEGATIVE  NEGATIVE     Leukocytes, UA NEGATIVE  NEGATIVE  MICROSCOPIC NOT DONE ON URINES WITH NEGATIVE PROTEIN, BLOOD, LEUKOCYTES, NITRITE, OR GLUCOSE <1000 mg/dL.  POCT I-STAT TROPONIN I     Status: Normal   Collection Time   01/08/11 11:58 AM      Component Value Range Comment   Troponin i, poc 0.00  0.00 - 0.08 (ng/mL)    Comment 3            CBC     Status: Normal   Collection Time   01/08/11  6:47 PM      Component Value Range Comment   WBC 7.7  4.0 - 10.5 (K/uL)    RBC 5.04  4.22 - 5.81 (MIL/uL)    Hemoglobin 14.8  13.0 - 17.0 (g/dL)    HCT 28.4  13.2 - 44.0 (%)    MCV 83.1  78.0 - 100.0 (fL)    MCH 29.4  26.0 - 34.0 (pg)    MCHC 35.3  30.0 - 36.0 (g/dL)    RDW 10.2  72.5 - 36.6 (%)    Platelets 156  150 - 400 (K/uL)   CREATININE, SERUM     Status: Abnormal   Collection Time   01/08/11  6:47 PM      Component Value Range Comment   Creatinine, Ser 0.88  0.50 - 1.35 (mg/dL)    GFR calc non Af Amer 82 (*) >90 (mL/min)    GFR calc Af Amer >90  >90 (mL/min)   BASIC METABOLIC PANEL     Status: Abnormal   Collection Time   01/09/11  7:18 AM      Component Value Range Comment   Sodium 143  135 - 145 (mEq/L)    Potassium 3.6  3.5 - 5.1 (mEq/L)    Chloride 110  96 - 112 (mEq/L)    CO2 26  19 - 32 (mEq/L)    Glucose, Bld 97  70 - 99 (mg/dL)    BUN 14  6 - 23 (mg/dL)    Creatinine, Ser 4.40  0.50 - 1.35 (mg/dL)    Calcium 8.9  8.4 - 10.5 (mg/dL)    GFR calc non Af Amer 79 (*) >90 (mL/min)    GFR calc Af Amer >90  >90 (mL/min)   CBC     Status: Normal   Collection Time   01/09/11  7:18 AM      Component Value Range Comment   WBC 5.9  4.0 - 10.5 (K/uL)    RBC 4.85  4.22 - 5.81 (MIL/uL)    Hemoglobin 13.9  13.0 - 17.0 (g/dL)  HCT 40.9  39.0 - 52.0 (%)    MCV 84.3  78.0 - 100.0 (fL)    MCH 28.7  26.0 - 34.0 (pg)    MCHC  34.0  30.0 - 36.0 (g/dL)    RDW 16.1  09.6 - 04.5 (%)    Platelets 153  150 - 400 (K/uL)     Dg Chest 2 View  01/08/2011  *RADIOLOGY REPORT*  Clinical Data: Numbness in both legs since yesterday.  Weakness.  CHEST - 2 VIEW  Comparison: CT 11/02/2010.  Plain films 08/18/2010.  Findings: Lateral view degraded by patient arm position.  Mild thoracic spondylosis.  Apical lordotic frontal view. Midline trachea.  Normal heart size and mediastinal contours. No pleural effusion or pneumothorax.  Clear lungs.  IMPRESSION: Normal chest.  Original Report Authenticated By: Consuello Bossier, M.D.   Ct Head Wo Contrast  01/08/2011  *RADIOLOGY REPORT*  Clinical Data: Bilateral leg weakness  CT HEAD WITHOUT CONTRAST  Technique:  Contiguous axial images were obtained from the base of the skull through the vertex without contrast.  Comparison: 01/27/2007  Findings: Mild atrophy. There is no evidence of acute intracranial hemorrhage, brain edema, mass lesion, acute infarction,   mass effect, or midline shift. Acute infarct may be inapparent on noncontrast CT.  No other intra-axial abnormalities are seen, and the ventricles and sulci are within normal limits in size and symmetry.   No abnormal extra-axial fluid collections or masses are identified.  No significant calvarial abnormality.  IMPRESSION: 1. Negative for bleed or other acute intracranial process.  Original Report Authenticated By: Osa Craver, M.D.   Mr Laqueta Jean WU Contrast  01/09/2011  *RADIOLOGY REPORT*  Clinical Data: Lower extremity weakness.  MRI HEAD WITHOUT AND WITH CONTRAST  Technique:  Multiplanar, multiecho pulse sequences of the brain and surrounding structures were obtained according to standard protocol without and with intravenous contrast  Contrast: 1 MULTIHANCE GADOBENATE DIMEGLUMINE 529 MG/ML IV SOLN  Comparison: CT 01/08/2011  Findings: Age appropriate atrophy.  Slight chronic microvascular ischemic change in the white matter.   Negative for acute infarct. Negative for hemorrhage or mass.  Postcontrast imaging the brain reveals normal enhancement.  Mild mucosal edema in the paranasal sinuses.  Joint effusion involving the right C1-C2 joint.  IMPRESSION: No acute intracranial abnormality.  Original Report Authenticated By: Camelia Phenes, M.D.   Mr Cervical Spine W Wo Contrast  01/09/2011  **ADDENDUM** CREATED: 01/09/2011 13:34:23  Critical Value/emergent results were called by telephone at the time of interpretation on 01/09/2011  at 1136 hours  to  Dr. Jerral Ralph, who verbally acknowledged these results.  **END ADDENDUM** SIGNED BY: Andreas Newport, M.D.   01/09/2011  *RADIOLOGY REPORT*  Clinical Data:  Leg weakness.  Lower extremity weakness and inability to ambulate.  Spinal stenosis.  MRI CERVICAL SPINE WITHOUT AND WITH CONTRAST  Technique:  Multiplanar and multiecho pulse sequences of the cervic al spine, to include the craniocervical junction and cervicothoraci c junction, were obtained according to standard protocol without an d with intravenous contrast.  Contrast: 15 ml Multihance  Comparison: None.  Findings:  Exaggeration of the normal cervical lordosis.  There appears to be ankylosis extending from C4-C6, with confluent anterior osteophytes. The appearance is compatible with diffuse idiopathic skeletal hyperostosis. Cervical cord edema is present associated with cervical cord compression at C6-C7.  There is probable ankylosis across C2-C3 as well.  Small right greater than left atlanto-occipital effusions are present.  Craniocervical junction appears normal. Nonspecific cystic  appearing lesion is present in the right thyroid lobe measuring 15 mm.  C2-C3:  Central canal patent.  Left foraminal encroachment associated with facet arthrosis.  C3-C4:  Mild central stenosis associated with broad-based disc osteophyte complex.  Mild right posterior ligamentum flavum redundancy.  Disc osteophyte complex contacts the ventral aspect of  the cervical cord and may flattened it slightly.  Right greater than left foraminal stenosis associated with uncovertebral spurring and facet arthrosis.  C4-C5:  Apparent ankylosis across the disc space.  Central canal and foramina appear patent.  C5-C6:  Facet hypertrophy.  Apparent ankylosis.  Mild central stenosis associated with osseous ridging.  No definite cord deformity.  C6-C7:  Severe central stenosis with compression of the cervical cord. Mild edema is present above and below the levels of compression.  Broad-based disc osteophyte complex and posterior ligamentum flavum redundancy produce the severe central stenosis. Bilateral foraminal stenosis secondary uncovertebral spurring. Bilateral facet effusions.  C7-T1:  Severe right facet arthrosis with right foraminal stenosis potentially affecting the right C8 nerve.  The foramen appears patent. Minimal central stenosis associated with posterior element hypertrophy and ligamentum flavum redundancy.  IMPRESSION: 1.  Cervical cord compression at C6-C7 with cord edema and / or myelomalacia.  Severe spinal stenosis due to broad-based disc osteophyte complex and posterior ligamentum flavum redundancy. 2.  Diffuse idiopathic skeletal hyperostosis with ankylosis from C4- C6, probably extending to C2 and C3 as well. 3.  Mild C3-C4 central stenosis secondary to disc osteophyte complex.  Right greater than left foraminal stenosis associated uncovertebral spurring.  MRI THORACIC SPINE WITHOUT AND WITH CONTRAST  Technique:  Multiplanar and multiecho pulse sequences of the thoracic spine were obtained without and with intravenous contrast.  Contrast: 15 MULTIHANCE GADOBENATE DIMEGLUMINE 529 MG/ML IV SOLN  Findings:  Mild dextroconvex curvature of the thoracic spine is present.  Remote compression fractures T7 and T8, with approximately 25% loss of vertebral body height.  No marrow edema. The thoracic cord demonstrates normal caliber and signal. Paraspinal soft tissues are  within normal limits.  Scattered vertebral body hemangiomata are present.  High signal is present within the discs at T6-T7 and T7-T8 with post-gadolinium enhancement.  There is no endplate edema, erosion or enhancement and this is compatible with degenerative enhancement of the discs.  This may also be due to ossification of the discs with development of marrow space.  Thoracic spine DISH is present.  Tiny central disc protrusion is present at T7-T8, just contacting the ventral aspect of the thoracic cord.  Per CMS PQRS reporting requirements (PQRS Measure 24): Given the patient's age of greater than 50 and the fracture site (hip, distal radius, or spine), the patient should be tested for osteoporosis using DXA, and the appropriate treatment considered based on the DXA results.  IMPRESSION: No thoracic cord compression.  Thoracic spine DISH and chronic compression fractures of T7 and T8 (25% loss of height) without retropulsion. Original Report Authenticated By: Andreas Newport, M.D.   Mr Thoracic Spine W Wo Contrast  01/09/2011  **ADDENDUM** CREATED: 01/09/2011 13:34:23  Critical Value/emergent results were called by telephone at the time of interpretation on 01/09/2011  at 1136 hours  to  Dr. Jerral Ralph, who verbally acknowledged these results.  **END ADDENDUM** SIGNED BY: Andreas Newport, M.D.   01/09/2011  *RADIOLOGY REPORT*  Clinical Data:  Leg weakness.  Lower extremity weakness and inability to ambulate.  Spinal stenosis.  MRI CERVICAL SPINE WITHOUT AND WITH CONTRAST  Technique:  Multiplanar and multiecho pulse sequences of  the cervic al spine, to include the craniocervical junction and cervicothoraci c junction, were obtained according to standard protocol without an d with intravenous contrast.  Contrast: 15 ml Multihance  Comparison: None.  Findings:  Exaggeration of the normal cervical lordosis.  There appears to be ankylosis extending from C4-C6, with confluent anterior osteophytes. The appearance is  compatible with diffuse idiopathic skeletal hyperostosis. Cervical cord edema is present associated with cervical cord compression at C6-C7.  There is probable ankylosis across C2-C3 as well.  Small right greater than left atlanto-occipital effusions are present.  Craniocervical junction appears normal. Nonspecific cystic appearing lesion is present in the right thyroid lobe measuring 15 mm.  C2-C3:  Central canal patent.  Left foraminal encroachment associated with facet arthrosis.  C3-C4:  Mild central stenosis associated with broad-based disc osteophyte complex.  Mild right posterior ligamentum flavum redundancy.  Disc osteophyte complex contacts the ventral aspect of the cervical cord and may flattened it slightly.  Right greater than left foraminal stenosis associated with uncovertebral spurring and facet arthrosis.  C4-C5:  Apparent ankylosis across the disc space.  Central canal and foramina appear patent.  C5-C6:  Facet hypertrophy.  Apparent ankylosis.  Mild central stenosis associated with osseous ridging.  No definite cord deformity.  C6-C7:  Severe central stenosis with compression of the cervical cord. Mild edema is present above and below the levels of compression.  Broad-based disc osteophyte complex and posterior ligamentum flavum redundancy produce the severe central stenosis. Bilateral foraminal stenosis secondary uncovertebral spurring. Bilateral facet effusions.  C7-T1:  Severe right facet arthrosis with right foraminal stenosis potentially affecting the right C8 nerve.  The foramen appears patent. Minimal central stenosis associated with posterior element hypertrophy and ligamentum flavum redundancy.  IMPRESSION: 1.  Cervical cord compression at C6-C7 with cord edema and / or myelomalacia.  Severe spinal stenosis due to broad-based disc osteophyte complex and posterior ligamentum flavum redundancy. 2.  Diffuse idiopathic skeletal hyperostosis with ankylosis from C4- C6, probably extending to C2  and C3 as well. 3.  Mild C3-C4 central stenosis secondary to disc osteophyte complex.  Right greater than left foraminal stenosis associated uncovertebral spurring.  MRI THORACIC SPINE WITHOUT AND WITH CONTRAST  Technique:  Multiplanar and multiecho pulse sequences of the thoracic spine were obtained without and with intravenous contrast.  Contrast: 15 MULTIHANCE GADOBENATE DIMEGLUMINE 529 MG/ML IV SOLN  Findings:  Mild dextroconvex curvature of the thoracic spine is present.  Remote compression fractures T7 and T8, with approximately 25% loss of vertebral body height.  No marrow edema. The thoracic cord demonstrates normal caliber and signal. Paraspinal soft tissues are within normal limits.  Scattered vertebral body hemangiomata are present.  High signal is present within the discs at T6-T7 and T7-T8 with post-gadolinium enhancement.  There is no endplate edema, erosion or enhancement and this is compatible with degenerative enhancement of the discs.  This may also be due to ossification of the discs with development of marrow space.  Thoracic spine DISH is present.  Tiny central disc protrusion is present at T7-T8, just contacting the ventral aspect of the thoracic cord.  Per CMS PQRS reporting requirements (PQRS Measure 24): Given the patient's age of greater than 50 and the fracture site (hip, distal radius, or spine), the patient should be tested for osteoporosis using DXA, and the appropriate treatment considered based on the DXA results.  IMPRESSION: No thoracic cord compression.  Thoracic spine DISH and chronic compression fractures of T7 and T8 (25% loss of height)  without retropulsion. Original Report Authenticated By: Andreas Newport, M.D.   Mr Lumbar Spine W Wo Contrast  01/08/2011  *RADIOLOGY REPORT*  Clinical Data: Bilateral lower extremity weakness.  History of prior lumbar spine surgery.  Progressive worsening weakness in the lower extremities.  MRI LUMBAR SPINE WITHOUT AND WITH CONTRAST   Technique:  Multiplanar and multiecho pulse sequences of the lumbar spine were obtained without and with intravenous contrast.  Contrast:  17 ml Multihance.  Comparison: MRI lumbar spine 09/14/2010.  Findings: Numbering convention used on prior exam preserved. Vertebral body height and marrow signal is within normal limits. Paraspinal soft tissues are within normal limits.  Distention of the urinary bladder is present.  No destructive osseous lesions. Postoperative changes of L4 laminectomy.  The small fluid collection in the operative bed seen on the recent comparison has resolved.  T12-L1:  Negative.  L1-L2:  Shallow posterior disc bulging without stenosis.  Mild facet hypertrophy and ligamentum flavum redundancy.  L2-L3:  Mild facet hypertrophy.  No stenosis.  L3-L4:  Bilateral facet hypertrophy and ligamentum flavum redundancy with mild central stenosis.  Unchanged mild symmetric bilateral foraminal stenosis.  L4-L5:  Laminectomy with wide posterior decompression.  Central canal and lateral recesses are patent.  The enhancing granulation tissue is present in the operative bed and the epidural space.  No epidural abscess or fluid collection.  Short pedicles are present with mild left foraminal stenosis.  Facet hypertrophy is present.  L5-S1:  Disc desiccation and degeneration.  Degenerative endplate changes are present.  Central canal and lateral recesses are patent.  Mild left foraminal stenosis associated with endplate spurring and facet hypertrophy.  The left greater than right facet degeneration/hypertrophy.  IMPRESSION:  1.  Evolving postoperative changes of L4 laminectomy with good decompression of the thecal sac and lateral recesses.  Mild left foraminal stenosis associated with short pedicles and facet hypertrophy potentially affecting the left L4 nerve. Resolution of small fluid collection in the operative bed.  Expected enhancing granulation tissue in the operative bed. 2.  Unchanged L5-S1 degenerative  disease with mild left foraminal stenosis secondary endplate spurring and facet hypertrophy. 3.  Unchanged L3-L4 facet arthrosis with mild central stenosis.  Original Report Authenticated By: Andreas Newport, M.D.    Review of systems: Negative except as above. He says his low back is doing well. Blood pressure 125/76, pulse 70, temperature 98.2 F (36.8 C), temperature source Oral, resp. rate 17, height 6' (1.829 m), weight 77 kg (169 lb 12.1 oz), SpO2 98.00%. General: A pleasant 77 year old white male in no apparent distress  HEENT: Normocephalic atraumatic, pupils equal round reactive light, extraocular muscles intact, oropharynx benign  Neck: Supple without masses deformities tracheal deviation. He's wearing an Biochemist, clinical  Thorax symmetric  Abdomen: Soft  Back exam: His lumbar incision is healing well.  Neurologic exam: The patient is alert and oriented x3. Cranial nerves II through XII are grossly intact bilaterally, vision and hearing are grossly normal bilaterally. The patient's motor strength is 5 over 5 his bilateral deltoid, biceps, triceps, hand grip, quadriceps, iliopsoas, gastrocnemius, dorsi flexors. Sensory exam demonstrates decreased light-touch sensation in his trunk and bilateral lower extremities. Deep tendon reflexes are 3-4/4 in his bilateral quadriceps gastrocnemius. The patient sustained clonus his bilateral ankles. Cerebellar function is grossly normal.  Imaging studies: I reviewed the patient's lumbar MRI performed without contrast at [facility] on 01/08/2011. It demonstrates she had a laminectomy L4 with a good decompression. There is no evidence of hematoma infection etc.  I've also reviewed reviewed the patient's thoracic MRI performed at [facility] on 01/09/2011. It is unremarkable.  I've also reviewed patient's cervical MRI performed 01/09/2011 at [facility]. It demonstrates the patient has evidence of "autofision" at C3-4, C4-5, C5-6.  Patient has severe stenosis at C6-7 from ventral osteophytes as well as posterior leaf from hypertrophied ligamentum flavum. There is evidence of spinal cord signal change at C6-7. He likely has diffuse idiopathic skeletal hyperostosis.  Assessment/Plan: C6-7 spondylosis, stenosis, myelopathy: I discussed situation with the patient and his daughter. We have discussed the various treatment options including surgery. I described the surgical option of a C6-7 anterior cervical discectomy, fusion, plating. I described the surgery to them. We have discussed the risks, benefits, alternatives, and likelihood of  achieving our goals. I have answered all her questions. They would like to proceed with surgery. We'll plan to do it sometime this week.  Seidy Labreck D 01/09/2011, 5:39 PM

## 2011-01-09 NOTE — Progress Notes (Signed)
Utilization review completed.  

## 2011-01-09 NOTE — Consult Note (Signed)
Reason for Consult:bilateral LE weakness   CC: Bilateral LE weakness  HPI: Ricky Bradley is an 77 y.o. Male poor historian who presents with bilateral lower extremity weakness. The patient notes that he had similar symptoms in may of this year when he was seen by dr. Lovell Sheehan of Orthopaedics and was noted to have a lumbar spinal stenosis and underwent laminectomy. He was better for some time  His daughter who is at his bedside states over the last few days he was complaining of increased lower extremity weakness and numbness and what he describes as a band of pressure across his abdomen below the level of the umbilicus. He denies any bowel or bladder abnormalities.BLE weakness but stated that the right was greater than the left (baseline prior to surgery) and that while he can feel things he states he has decreased sensation bilaterally in LE.    Past Medical History  Diagnosis Date  . Hypertension   . GERD (gastroesophageal reflux disease)   . High cholesterol   . Enlarged prostate   . High cholesterol     Past Surgical History  Procedure Date  . Joint replacement 2001  and 2002    both knees  . Laminectomy 08/2010  . Back surgery   . Replacement total knee   . Cholecystectomy 11/01/10    History reviewed. No pertinent family history.  Social History:  reports that he has quit smoking. He does not have any smokeless tobacco history on file. He reports that he does not drink alcohol or use illicit drugs.  Allergies  Allergen Reactions  . Vicodin (Hydrocodone-Acetaminophen) Hives    On neck and chest.    Medications:   Prior to Admission medications   Medication Sig Start Date End Date Taking? Authorizing Provider  aspirin 81 MG EC tablet Take 81 mg by mouth daily. Stop taking before surgery   Yes Historical Provider, MD  bethanechol (URECHOLINE) 25 MG tablet Take 25 mg by mouth 2 (two) times daily.     Yes Historical Provider, MD  Cetirizine HCl (ZYRTEC PO) Take by mouth daily.      Yes Historical Provider, MD  citalopram (CELEXA) 10 MG tablet Take 10 mg by mouth daily.     Yes Historical Provider, MD  dicyclomine (BENTYL) 10 MG capsule Take 10 mg by mouth 4 (four) times daily -  before meals and at bedtime.     Yes Historical Provider, MD  lovastatin (MEVACOR) 40 MG tablet Take 40 mg by mouth at bedtime.     Yes Historical Provider, MD  metoprolol succinate (TOPROL-XL) 25 MG 24 hr tablet Take 12.5 mg by mouth daily.     Yes Historical Provider, MD  NEXIUM 40 MG capsule Take 40 mg by mouth daily before breakfast.  11/29/10  Yes Historical Provider, MD  oxyCODONE (OXY IR/ROXICODONE) 5 MG immediate release tablet Take 5 mg by mouth every 4 (four) hours as needed.     Yes Historical Provider, MD  Tamsulosin HCl (FLOMAX) 0.4 MG CAPS Take 0.4 mg by mouth 2 (two) times daily.    Yes Historical Provider, MD    Scheduled:   . aspirin EC  81 mg Oral Daily  . bethanechol  25 mg Oral BID  . citalopram  10 mg Oral Daily  . dicyclomine  10 mg Oral TID AC & HS  . enoxaparin  40 mg Subcutaneous Q24H  . loratadine  10 mg Oral Daily  . LORazepam      . LORazepam  1 mg Intravenous Once  . LORazepam  1 mg Intravenous Once  . LORazepam  1 mg Intravenous Once  . LORazepam  2 mg Intravenous Once  . metoprolol succinate  12.5 mg Oral Daily  . pantoprazole  40 mg Oral Daily  . simvastatin  20 mg Oral q1800  . sodium chloride  1,000 mL Intravenous Once  . Tamsulosin HCl  0.4 mg Oral BID  . DISCONTD: aspirin  81 mg Oral Daily  . DISCONTD: cetirizine  5 mg Oral QHS    Review of Systems - General ROS: negative for - chills, fatigue, fever or hot flashes Hematological and Lymphatic ROS: negative for - bruising, fatigue, jaundice or pallor Endocrine ROS: negative for - hair pattern changes, hot flashes, mood swings or skin changes Respiratory ROS: negative for - cough, hemoptysis, orthopnea or wheezing Cardiovascular ROS: negative for - dyspnea on exertion, orthopnea, palpitations or  shortness of breath Gastrointestinal ROS: negative for - abdominal pain, appetite loss, blood in stools, diarrhea or hematemesis Musculoskeletal ROS: negative for - joint pain, joint stiffness, joint swelling or muscle pain Neurological ROS: positive for - gait disturbance Dermatological ROS: negative for dry skin, pruritus and rash   Blood pressure 106/56, pulse 71, temperature 98.3 F (36.8 C), temperature source Oral, resp. rate 16, height 6' (1.829 m), weight 77 kg (169 lb 12.1 oz), SpO2 98.00%.  Neurologic Examination: Mental Status: Alert, oriented, thought content appropriate.  Speech fluent without evidence of aphasia.  Able to follow 3 step commands without difficulty. Cranial Nerves: II: visual fields grossly normal, pupils equal, round, reactive to light and accommodation III,IV, VI: ptosis not present, extraocular muscles extra-ocular motions intact bilaterally V,VII: smile symmetric, facial light touch sensation normal bilaterally VIII: hearing normal bilaterally IX,X: gag reflex present XI: trapezius strength/neck flexion strength normal bilaterally XII: tongue strength normal  Motor: Right : Upper extremity    Left:     Upper extremity 5/5 deltoid       5/5 deltoid 5/5 tricep      5/5 tricep 5/5 biceps      5/5 biceps  5/5wrist flexion     5/5 wrist flexion 5/5 wrist extension     5/5 wrist extension 5/5 hand grip      5/5 hand grip  Lower extremity     Lower extremity 3/5 hip flexor      3/5 hip flexor 5/5 hip adductors     5/5 hip adductors 5/5 hip abductors     5/5 hip abductors 5/5 quadricep      5/5 quadriceps  4/5 hamstrings     4/5 hamstrings 5/5 plantar flexion       5/5 plantar flexion 5/5 plantar extension     5/5 plantar extension  His exam is difficult and at times inconsistent.  He is able to hip flex and bend his knees strong enough to pull his knees up to a 45-60 degree angle bilaterally however if I hold his leg out and ask him to bend his knee he  has a hard time bending his leg with gravity bilaterally.  He is able to flex his hip to a 45-60 degree angle but cannot take part in H-S at all stating he cannot bend his legs. Tone and bulk:normal tone throughout; no atrophy noted Sensory: Pinprick and light touch shows abnormal distribution.  He states the is a definitive change in sensation at the level of T6 with decreased sensation below T6.  As I move laterally he states the  level of sensation change is mor at T10-T11 and the sensation is sharper below T10 and stronger above T10 bilaterally.    Deep Tendon Reflexes: 2+ and symmetric throughout bilateral UE, patella is 3+ no clonus, Achilles 2+ no clonus Plantars: Right: upgoing   Left: upgoing Cerebellar: normal finger-to-nose, normal rapid alternating movements   Results for orders placed during the hospital encounter of 01/08/11 (from the past 48 hour(s))  CBC     Status: Normal   Collection Time   01/08/11 11:46 AM      Component Value Range Comment   WBC 6.9  4.0 - 10.5 (K/uL)    RBC 5.18  4.22 - 5.81 (MIL/uL)    Hemoglobin 15.4  13.0 - 17.0 (g/dL)    HCT 01.0  27.2 - 53.6 (%)    MCV 83.0  78.0 - 100.0 (fL)    MCH 29.7  26.0 - 34.0 (pg)    MCHC 35.8  30.0 - 36.0 (g/dL)    RDW 64.4  03.4 - 74.2 (%)    Platelets 182  150 - 400 (K/uL)   DIFFERENTIAL     Status: Normal   Collection Time   01/08/11 11:46 AM      Component Value Range Comment   Neutrophils Relative 66  43 - 77 (%)    Neutro Abs 4.6  1.7 - 7.7 (K/uL)    Lymphocytes Relative 22  12 - 46 (%)    Lymphs Abs 1.5  0.7 - 4.0 (K/uL)    Monocytes Relative 10  3 - 12 (%)    Monocytes Absolute 0.7  0.1 - 1.0 (K/uL)    Eosinophils Relative 2  0 - 5 (%)    Eosinophils Absolute 0.1  0.0 - 0.7 (K/uL)    Basophils Relative 0  0 - 1 (%)    Basophils Absolute 0.0  0.0 - 0.1 (K/uL)   COMPREHENSIVE METABOLIC PANEL     Status: Abnormal   Collection Time   01/08/11 11:46 AM      Component Value Range Comment   Sodium 141  135  - 145 (mEq/L)    Potassium 3.0 (*) 3.5 - 5.1 (mEq/L)    Chloride 103  96 - 112 (mEq/L)    CO2 26  19 - 32 (mEq/L)    Glucose, Bld 111 (*) 70 - 99 (mg/dL)    BUN 12  6 - 23 (mg/dL)    Creatinine, Ser 5.95  0.50 - 1.35 (mg/dL)    Calcium 9.8  8.4 - 10.5 (mg/dL)    Total Protein 7.0  6.0 - 8.3 (g/dL)    Albumin 3.8  3.5 - 5.2 (g/dL)    AST 14  0 - 37 (U/L)    ALT 13  0 - 53 (U/L)    Alkaline Phosphatase 105  39 - 117 (U/L)    Total Bilirubin 0.6  0.3 - 1.2 (mg/dL)    GFR calc non Af Amer 70 (*) >90 (mL/min)    GFR calc Af Amer 81 (*) >90 (mL/min)   URINALYSIS, ROUTINE W REFLEX MICROSCOPIC     Status: Normal   Collection Time   01/08/11 11:50 AM      Component Value Range Comment   Color, Urine YELLOW  YELLOW     Appearance CLEAR  CLEAR     Specific Gravity, Urine 1.007  1.005 - 1.030     pH 8.0  5.0 - 8.0     Glucose, UA NEGATIVE  NEGATIVE (mg/dL)  Hgb urine dipstick NEGATIVE  NEGATIVE     Bilirubin Urine NEGATIVE  NEGATIVE     Ketones, ur NEGATIVE  NEGATIVE (mg/dL)    Protein, ur NEGATIVE  NEGATIVE (mg/dL)    Urobilinogen, UA 0.2  0.0 - 1.0 (mg/dL)    Nitrite NEGATIVE  NEGATIVE     Leukocytes, UA NEGATIVE  NEGATIVE  MICROSCOPIC NOT DONE ON URINES WITH NEGATIVE PROTEIN, BLOOD, LEUKOCYTES, NITRITE, OR GLUCOSE <1000 mg/dL.  POCT I-STAT TROPONIN I     Status: Normal   Collection Time   01/08/11 11:58 AM      Component Value Range Comment   Troponin i, poc 0.00  0.00 - 0.08 (ng/mL)    Comment 3            CBC     Status: Normal   Collection Time   01/08/11  6:47 PM      Component Value Range Comment   WBC 7.7  4.0 - 10.5 (K/uL)    RBC 5.04  4.22 - 5.81 (MIL/uL)    Hemoglobin 14.8  13.0 - 17.0 (g/dL)    HCT 40.1  02.7 - 25.3 (%)    MCV 83.1  78.0 - 100.0 (fL)    MCH 29.4  26.0 - 34.0 (pg)    MCHC 35.3  30.0 - 36.0 (g/dL)    RDW 66.4  40.3 - 47.4 (%)    Platelets 156  150 - 400 (K/uL)   CREATININE, SERUM     Status: Abnormal   Collection Time   01/08/11  6:47 PM       Component Value Range Comment   Creatinine, Ser 0.88  0.50 - 1.35 (mg/dL)    GFR calc non Af Amer 82 (*) >90 (mL/min)    GFR calc Af Amer >90  >90 (mL/min)   BASIC METABOLIC PANEL     Status: Abnormal   Collection Time   01/09/11  7:18 AM      Component Value Range Comment   Sodium 143  135 - 145 (mEq/L)    Potassium 3.6  3.5 - 5.1 (mEq/L)    Chloride 110  96 - 112 (mEq/L)    CO2 26  19 - 32 (mEq/L)    Glucose, Bld 97  70 - 99 (mg/dL)    BUN 14  6 - 23 (mg/dL)    Creatinine, Ser 2.59  0.50 - 1.35 (mg/dL)    Calcium 8.9  8.4 - 10.5 (mg/dL)    GFR calc non Af Amer 79 (*) >90 (mL/min)    GFR calc Af Amer >90  >90 (mL/min)   CBC     Status: Normal   Collection Time   01/09/11  7:18 AM      Component Value Range Comment   WBC 5.9  4.0 - 10.5 (K/uL)    RBC 4.85  4.22 - 5.81 (MIL/uL)    Hemoglobin 13.9  13.0 - 17.0 (g/dL)    HCT 56.3  87.5 - 64.3 (%)    MCV 84.3  78.0 - 100.0 (fL)    MCH 28.7  26.0 - 34.0 (pg)    MCHC 34.0  30.0 - 36.0 (g/dL)    RDW 32.9  51.8 - 84.1 (%)    Platelets 153  150 - 400 (K/uL)     Dg Chest 2 View  01/08/2011  *RADIOLOGY REPORT*  Clinical Data: Numbness in both legs since yesterday.  Weakness.  CHEST - 2 VIEW  Comparison: CT 11/02/2010.  Plain films 08/18/2010.  Findings: Lateral view degraded by patient arm position.  Mild thoracic spondylosis.  Apical lordotic frontal view. Midline trachea.  Normal heart size and mediastinal contours. No pleural effusion or pneumothorax.  Clear lungs.  IMPRESSION: Normal chest.  Original Report Authenticated By: Consuello Bossier, M.D.   Ct Head Wo Contrast  01/08/2011  *RADIOLOGY REPORT*  Clinical Data: Bilateral leg weakness  CT HEAD WITHOUT CONTRAST  Technique:  Contiguous axial images were obtained from the base of the skull through the vertex without contrast.  Comparison: 01/27/2007  Findings: Mild atrophy. There is no evidence of acute intracranial hemorrhage, brain edema, mass lesion, acute infarction,   mass effect,  or midline shift. Acute infarct may be inapparent on noncontrast CT.  No other intra-axial abnormalities are seen, and the ventricles and sulci are within normal limits in size and symmetry.   No abnormal extra-axial fluid collections or masses are identified.  No significant calvarial abnormality.  IMPRESSION: 1. Negative for bleed or other acute intracranial process.  Original Report Authenticated By: Osa Craver, M.D.   Mr Lumbar Spine W Wo Contrast  01/08/2011  *RADIOLOGY REPORT*  Clinical Data: Bilateral lower extremity weakness.  History of prior lumbar spine surgery.  Progressive worsening weakness in the lower extremities.  MRI LUMBAR SPINE WITHOUT AND WITH CONTRAST  Technique:  Multiplanar and multiecho pulse sequences of the lumbar spine were obtained without and with intravenous contrast.  Contrast:  17 ml Multihance.  Comparison: MRI lumbar spine 09/14/2010.  Findings: Numbering convention used on prior exam preserved. Vertebral body height and marrow signal is within normal limits. Paraspinal soft tissues are within normal limits.  Distention of the urinary bladder is present.  No destructive osseous lesions. Postoperative changes of L4 laminectomy.  The small fluid collection in the operative bed seen on the recent comparison has resolved.  T12-L1:  Negative.  L1-L2:  Shallow posterior disc bulging without stenosis.  Mild facet hypertrophy and ligamentum flavum redundancy.  L2-L3:  Mild facet hypertrophy.  No stenosis.  L3-L4:  Bilateral facet hypertrophy and ligamentum flavum redundancy with mild central stenosis.  Unchanged mild symmetric bilateral foraminal stenosis.  L4-L5:  Laminectomy with wide posterior decompression.  Central canal and lateral recesses are patent.  The enhancing granulation tissue is present in the operative bed and the epidural space.  No epidural abscess or fluid collection.  Short pedicles are present with mild left foraminal stenosis.  Facet hypertrophy is  present.  L5-S1:  Disc desiccation and degeneration.  Degenerative endplate changes are present.  Central canal and lateral recesses are patent.  Mild left foraminal stenosis associated with endplate spurring and facet hypertrophy.  The left greater than right facet degeneration/hypertrophy.    IMPRESSION:  1.  Evolving postoperative changes of L4 laminectomy with good decompression of the thecal sac and lateral recesses.  Mild left foraminal stenosis associated with short pedicles and facet hypertrophy potentially affecting the left L4 nerve. Resolution of small fluid collection in the operative bed.  Expected enhancing granulation tissue in the operative bed. 2.  Unchanged L5-S1 degenerative disease with mild left foraminal stenosis secondary endplate spurring and facet hypertrophy. 3.  Unchanged L3-L4 facet arthrosis with mild central stenosis.  Original Report Authenticated By: Andreas Newport, M.D.   MRI HEAD WITHOUT AND WITH CONTRAST  Technique: Multiplanar, multiecho pulse sequences of the brain and  surrounding structures were obtained according to standard protocol  without and with intravenous contrast  Contrast: 1 MULTIHANCE GADOBENATE DIMEGLUMINE 529 MG/ML IV SOLN  Comparison: CT 01/08/2011  Findings: Age appropriate atrophy. Slight chronic microvascular  ischemic change in the white matter. Negative for acute infarct.  Negative for hemorrhage or mass. Postcontrast imaging the brain  reveals normal enhancement.  Mild mucosal edema in the paranasal sinuses. Joint effusion  involving the right C1-C2 joint.   IMPRESSION:  No acute intracranial abnormality.  MRI C-spine IMPRESSION: 1. Cervical cord compression at C6-C7 with cord edema and / or myelomalacia. Severe spinal stenosis due to broad-based disc osteophyte complex and posterior ligamentum flavum redundancy. 2. Diffuse idiopathic skeletal hyperostosis with ankylosis from C4- C6, probably extending to C2 and C3 as well. 3.  Mild C3-C4 central stenosis secondary to disc osteophyte complex. Right greater than left foraminal stenosis associated uncovertebral spurring.        Assessment/Plan:  77 YO Cervical cord compression at C6-C7 with cord edema and/or myelomalacia.  Neurosurgery has been consulted and will make further arrangements concerning surgery and patient has been placed on steroids to decrease the swelling.   At this time will defer further treatment to neurosurgery and [facility].  Felicie Morn PA-C [facility] [phone]  01/09/2011, 11:31 AM

## 2011-01-09 NOTE — Progress Notes (Signed)
Addendum:  I received a call from Dr. Lamke-radiology around 1:30 PM, per Dr. Carlota Raspberry, patient has cervical cord compression at C6-C7 with cord edema and myelomalacia. -Patient's neurological deficits are unchanged. -Attempted to call Dr. Lovell Sheehan from [facility] neurosurgery, he was in the operating room. I spoke to him indirectly via the nurse and informed him of the MRI findings. He suggested to continue with dexamethasone at a dose of 8 mg IV every 6 and place the patient on a cervical hard collar. He will evaluate in the next few hours. -I had a long discussion with the patient and the patient's daughter at bedside and have apprised him of the concerning MRI findings. Dr. Lyman Speller from neurology services also evaluate the patient. -I have also informed the nurse that the patient needs a stat dose of dexamethasone along with a cervical call and a Foley catheter placed. -We'll continue to monitor the patient until Dr. Lovell Sheehan formally evaluated later today.

## 2011-01-09 NOTE — Progress Notes (Signed)
PATIENT DETAILS Name: TORREY BALLINAS Age: 77 y.o. Sex: male Date of Birth: 31-May-1933 Admit Date: 01/08/2011 ZOX:[id number], Smith Robert, RN, Nursing/RN Coord  Subjective: Having bilateral lower extremity weakness for a week. Worsening over the past few days. Patient unable to ambulate like baseline. Denies any back pain. Denies fever. Had her lumbar laminectomy done in July of this year for neurogenic claudication secondary to lumbar stenosis.  Objective: Vital signs in last 24 hours: Filed Vitals:   01/08/11 1630 01/08/11 1929 01/08/11 2100 01/09/11 0536  BP:  115/86 137/76 106/56  Pulse: 68 77 73 71  Temp:  98.4 F (36.9 C) 98.2 F (36.8 C) 98.3 F (36.8 C)  TempSrc:  Oral Oral Oral  Resp: 16 18 18 16   Height:   6' (1.829 m)   Weight:   77 kg (169 lb 12.1 oz)   SpO2: 99% 99% 100% 98%    Weight change:   Body mass index is 23.02 kg/(m^2).  Intake/Output from previous day:  Intake/Output Summary (Last 24 hours) at 01/09/11 0827 Last data filed at 01/08/11 1351  Gross per 24 hour  Intake  98119 ml  Output      0 ml  Net  10000 ml    PHYSICAL EXAM: Gen Exam: Awake and alert with clear speech.  Neck: Supple, No JVD.   Chest: B/L Clear.   CVS: S1 S2 Regular, no murmurs.  Abdomen: soft, BS +, non tender, non distended.  Extremities: no edema, lower extremities warm to touch. Neurologic: Bilateral lower extremity strength is around 4/5. Patient is very hyperreflexic in lower extremities.. He has clonus in bilateral lower extremities. Sensation is grossly intact.  Strength in his bilateral upper extremities appear to be 5 out of 5. There is some mild hyperreflexia as well and the upper extremities, however is not as pronounced as in the lower extremities. Skin: No Rash.   Wounds: N/A.    CONSULTS: -neurology-have spoken with Dr. Hedwig Morton. He will evaluate -Have left a message for Dr. Lovell Sheehan (neurosurgery) at his office, we'll await callback.  LAB RESULTS: CBC  Lab  01/09/11 0718 01/08/11 1847 01/08/11 1146  WBC 5.9 7.7 6.9  HGB 13.9 14.8 15.4  HCT 40.9 41.9 43.0  PLT 153 156 182  MCV 84.3 83.1 83.0  MCH 28.7 29.4 29.7  MCHC 34.0 35.3 35.8  RDW 14.2 13.9 13.7  LYMPHSABS -- -- 1.5  MONOABS -- -- 0.7  EOSABS -- -- 0.1  BASOSABS -- -- 0.0  BANDABS -- -- --    Chemistries   Lab 01/08/11 1847 01/08/11 1146  NA -- 141  K -- 3.0*  CL -- 103  CO2 -- 26  GLUCOSE -- 111*  BUN -- 12  CREATININE 0.88 1.02  CALCIUM -- 9.8  MG -- --    GFR Estimated Creatinine Clearance: 79 ml/min (by C-G formula based on Cr of 0.88).  Coagulation profile No results found for this basename: INR:5,PROTIME:5 in the last 168 hours  Cardiac Enzymes No results found for this basename: CK:3,CKMB:3,TROPONINI:3,MYOGLOBIN:3 in the last 168 hours  No results found for this basename: POCBNP:3 in the last 168 hours No results found for this basename: DDIMER:2 in the last 72 hours No results found for this basename: HGBA1C:2 in the last 72 hours No results found for this basename: CHOL:2,HDL:2,LDLCALC:2,TRIG:2,CHOLHDL:2,LDLDIRECT:2 in the last 72 hours No results found for this basename: TSH,T4TOTAL,FREET3,T3FREE,THYROIDAB in the last 72 hours No results found for this basename: VITAMINB12:2,FOLATE:2,FERRITIN:2,TIBC:2,IRON:2,RETICCTPCT:2 in the last 72 hours No results  found for this basename: LIPASE:2,AMYLASE:2 in the last 72 hours  Urine Studies No results found for this basename: UACOL:2,UAPR:2,USPG:2,UPH:2,UTP:2,UGL:2,UKET:2,UBIL:2,UHGB:2,UNIT:2,UROB:2,ULEU:2,UEPI:2,UWBC:2,URBC:2,UBAC:2,CAST:2,CRYS:2,UCOM:2,BILUA:2 in the last 72 hours  MICROBIOLOGY: No results found for this or any previous visit (from the past 240 hour(s)).  RADIOLOGY STUDIES/RESULTS: Dg Chest 2 View  01/08/2011  *RADIOLOGY REPORT*  Clinical Data: Numbness in both legs since yesterday.  Weakness.  CHEST - 2 VIEW  Comparison: CT 11/02/2010.  Plain films 08/18/2010.  Findings: Lateral view  degraded by patient arm position.  Mild thoracic spondylosis.  Apical lordotic frontal view. Midline trachea.  Normal heart size and mediastinal contours. No pleural effusion or pneumothorax.  Clear lungs.  IMPRESSION: Normal chest.  Original Report Authenticated By: Consuello Bossier, M.D.   Ct Head Wo Contrast  01/08/2011  *RADIOLOGY REPORT*  Clinical Data: Bilateral leg weakness  CT HEAD WITHOUT CONTRAST  Technique:  Contiguous axial images were obtained from the base of the skull through the vertex without contrast.  Comparison: 01/27/2007  Findings: Mild atrophy. There is no evidence of acute intracranial hemorrhage, brain edema, mass lesion, acute infarction,   mass effect, or midline shift. Acute infarct may be inapparent on noncontrast CT.  No other intra-axial abnormalities are seen, and the ventricles and sulci are within normal limits in size and symmetry.   No abnormal extra-axial fluid collections or masses are identified.  No significant calvarial abnormality.  IMPRESSION: 1. Negative for bleed or other acute intracranial process.  Original Report Authenticated By: Osa Craver, M.D.   Mr Lumbar Spine W Wo Contrast  01/08/2011  *RADIOLOGY REPORT*  Clinical Data: Bilateral lower extremity weakness.  History of prior lumbar spine surgery.  Progressive worsening weakness in the lower extremities.  MRI LUMBAR SPINE WITHOUT AND WITH CONTRAST  Technique:  Multiplanar and multiecho pulse sequences of the lumbar spine were obtained without and with intravenous contrast.  Contrast:  17 ml Multihance.  Comparison: MRI lumbar spine 09/14/2010.  Findings: Numbering convention used on prior exam preserved. Vertebral body height and marrow signal is within normal limits. Paraspinal soft tissues are within normal limits.  Distention of the urinary bladder is present.  No destructive osseous lesions. Postoperative changes of L4 laminectomy.  The small fluid collection in the operative bed seen on the  recent comparison has resolved.  T12-L1:  Negative.  L1-L2:  Shallow posterior disc bulging without stenosis.  Mild facet hypertrophy and ligamentum flavum redundancy.  L2-L3:  Mild facet hypertrophy.  No stenosis.  L3-L4:  Bilateral facet hypertrophy and ligamentum flavum redundancy with mild central stenosis.  Unchanged mild symmetric bilateral foraminal stenosis.  L4-L5:  Laminectomy with wide posterior decompression.  Central canal and lateral recesses are patent.  The enhancing granulation tissue is present in the operative bed and the epidural space.  No epidural abscess or fluid collection.  Short pedicles are present with mild left foraminal stenosis.  Facet hypertrophy is present.  L5-S1:  Disc desiccation and degeneration.  Degenerative endplate changes are present.  Central canal and lateral recesses are patent.  Mild left foraminal stenosis associated with endplate spurring and facet hypertrophy.  The left greater than right facet degeneration/hypertrophy.  IMPRESSION:  1.  Evolving postoperative changes of L4 laminectomy with good decompression of the thecal sac and lateral recesses.  Mild left foraminal stenosis associated with short pedicles and facet hypertrophy potentially affecting the left L4 nerve. Resolution of small fluid collection in the operative bed.  Expected enhancing granulation tissue in the operative bed. 2.  Unchanged  L5-S1 degenerative disease with mild left foraminal stenosis secondary endplate spurring and facet hypertrophy. 3.  Unchanged L3-L4 facet arthrosis with mild central stenosis.  Original Report Authenticated By: Andreas Newport, M.D.   Ct Abdomen Pelvis W Contrast  12/10/2010  *RADIOLOGY REPORT*  Clinical Data: Abdominal pain  CT ABDOMEN AND PELVIS WITH CONTRAST  Technique:  Multidetector CT imaging of the abdomen and pelvis was performed following the standard protocol during bolus administration of intravenous contrast.  Contrast: OMNIPAQUE IOHEXOL 300 MG/ML IV  SOLN  Comparison: 12/04/2010  Findings: Visualized lung bases clear.  Vascular clips in gallbladder fossa.  Stable sub centimeter   cyst in the lateral left hepatic segment near the dome.  Unremarkable spleen and accessory splenule, adrenal glands, pancreas, kidneys.  Stomach is physiologically distended.  Small bowel decompressed.  Normal appendix.  The colon is nondilated, with scattered sigmoid diverticula; no adjacent inflammatory/edematous change. Rectum is decompressed.  Urinary bladder physiologically distended.  Moderate prostatic enlargement.  Bilateral pelvic phleboliths.  No pelvic, retroperitoneal, or mesenteric adenopathy.  Portal vein patent. Atheromatous nondilated abdominal aorta.  No hydronephrosis. Spondylitic changes in the lumbar spine with postop changes L4-5, advanced disc narrowing L5-S1.  IMPRESSION:  1.  No acute abdominal process. 2.  Stable degenerative and postoperative changes as above.  Original Report Authenticated By: Osa Craver, M.D.    MEDICATIONS: Scheduled Meds:   . aspirin EC  81 mg Oral Daily  . bethanechol  25 mg Oral BID  . citalopram  10 mg Oral Daily  . dicyclomine  10 mg Oral TID AC & HS  . enoxaparin  40 mg Subcutaneous Q24H  . loratadine  10 mg Oral Daily  . LORazepam  1 mg Intravenous Once  . LORazepam  2 mg Intravenous Once  . metoprolol succinate  12.5 mg Oral Daily  . pantoprazole  40 mg Oral Daily  . simvastatin  20 mg Oral q1800  . sodium chloride  1,000 mL Intravenous Once  . Tamsulosin HCl  0.4 mg Oral BID  . DISCONTD: aspirin  81 mg Oral Daily  . DISCONTD: cetirizine  5 mg Oral QHS   Continuous Infusions:  PRN Meds:.acetaminophen, acetaminophen, gadobenate dimeglumine, ondansetron (ZOFRAN) IV, ondansetron, oxyCODONE  Antibiotics: Anti-infectives    None      Assessment/Plan: Patient Active Hospital Problem List: Lower extremity weakness    Assessment: With upper motor neuron findings on physical exam. MRI of the  lumbosacral spine is not very revealing.    Plan: Will await MRI of cervical and thoracic spine. Will await neurology recommendations. Will discuss case with Dr. Lovell Sheehan from neurosurgery as well.   HTN (hypertension)   Assessment: Controlled    Plan: Continue with metoprolol.   Dyslipidemia    Assessment: Stable    Plan: Continue with Zocor   BPH (benign prostatic hyperplasia)    Assessment: An ongoing issue, but no difficulty urinating.    Plan: Continue with Flomax and bethanechol.   GERD (gastroesophageal reflux disease)    Assessment: Stable    Plan: Continue with  Protonix.   History of Lumbar stenosis with neurogenic claudication-status post lumbar laminectomy    Assessment: MRI of the lumbosacral spine does not show major abnormalities that could account for current symptoms.    Plan: Will await neurology evaluation. If imaging of his cervical and thoracic spine are also unrevealing, then we'll need to discuss with neurosurgery if other methods of imaging required for his lumbar spine.  Disposition: Remain inpatient  DVT Prophylaxis:  prophylactic Lovenox   Code Status:  full code   Maretta Bees, MD. 01/09/2011, 8:27 AM

## 2011-01-10 ENCOUNTER — Other Ambulatory Visit: Payer: Self-pay | Admitting: Neurosurgery

## 2011-01-10 DIAGNOSIS — G9520 Unspecified cord compression: Secondary | ICD-10-CM | POA: Diagnosis present

## 2011-01-10 DIAGNOSIS — K589 Irritable bowel syndrome without diarrhea: Secondary | ICD-10-CM | POA: Diagnosis present

## 2011-01-10 MED ORDER — POLYETHYLENE GLYCOL 3350 17 G PO PACK
17.0000 g | PACK | Freq: Two times a day (BID) | ORAL | Status: DC
  Administered 2011-01-10 – 2011-01-24 (×21): 17 g via ORAL
  Filled 2011-01-10 (×32): qty 1

## 2011-01-10 MED ORDER — ZOLPIDEM TARTRATE 5 MG PO TABS
5.0000 mg | ORAL_TABLET | Freq: Every evening | ORAL | Status: DC | PRN
  Administered 2011-01-10 – 2011-01-21 (×8): 5 mg via ORAL
  Filled 2011-01-10 (×9): qty 1

## 2011-01-10 MED ORDER — CEFAZOLIN SODIUM 1-5 GM-% IV SOLN
1.0000 g | INTRAVENOUS | Status: AC
  Administered 2011-01-16 (×2): 1 g via INTRAVENOUS
  Filled 2011-01-10 (×2): qty 50

## 2011-01-10 NOTE — Progress Notes (Signed)
Patient ID: Ricky Bradley, male   DOB: 09-15-1933, 77 y.o.   MRN: [id number] Subjective:  Patient is alert and pleasant. He has no new complaints.  Objective: Vital signs in last 24 hours: Temp:  [97.6 F (36.4 C)-98.5 F (36.9 C)] 98.5 F (36.9 C) (11/27 0547) Pulse Rate:  [70-86] 81  (11/27 0547) Resp:  [16-19] 19  (11/27 0547) BP: (120-125)/(68-76) 123/72 mmHg (11/27 0547) SpO2:  [96 %-98 %] 96 % (11/27 0547) Weight:  [81.466 kg (179 lb 9.6 oz)] 179 lb 9.6 oz (81.466 kg) (11/27 0547)  Intake/Output from previous day: 11/26 0701 - 11/27 0700 In: -  Out: 650 [Urine:650] Intake/Output this shift:    Physical exam the patient is alert and oriented. Heis moving all 4 extremities well.  Lab Results:  [facility] 01/09/11 0718 01/08/11 1847  WBC 5.9 7.7  HGB 13.9 14.8  HCT 40.9 41.9  PLT 153 156   BMET  Basename 01/09/11 0718 01/08/11 1847 01/08/11 1146  NA 143 -- 141  K 3.6 -- 3.0*  CL 110 -- 103  CO2 26 -- 26  GLUCOSE 97 -- 111*  BUN 14 -- 12  CREATININE 0.95 0.88 --  CALCIUM 8.9 -- 9.8    Studies/Results: Dg Chest 2 View  01/08/2011  *RADIOLOGY REPORT*  Clinical Data: Numbness in both legs since yesterday.  Weakness.  CHEST - 2 VIEW  Comparison: CT 11/02/2010.  Plain films 08/18/2010.  Findings: Lateral view degraded by patient arm position.  Mild thoracic spondylosis.  Apical lordotic frontal view. Midline trachea.  Normal heart size and mediastinal contours. No pleural effusion or pneumothorax.  Clear lungs.  IMPRESSION: Normal chest.  Original Report Authenticated By: Consuello Bossier, M.D.   Ct Head Wo Contrast  01/08/2011  *RADIOLOGY REPORT*  Clinical Data: Bilateral leg weakness  CT HEAD WITHOUT CONTRAST  Technique:  Contiguous axial images were obtained from the base of the skull through the vertex without contrast.  Comparison: 01/27/2007  Findings: Mild atrophy. There is no evidence of acute intracranial hemorrhage, brain edema, mass lesion, acute infarction,    mass effect, or midline shift. Acute infarct may be inapparent on noncontrast CT.  No other intra-axial abnormalities are seen, and the ventricles and sulci are within normal limits in size and symmetry.   No abnormal extra-axial fluid collections or masses are identified.  No significant calvarial abnormality.  IMPRESSION: 1. Negative for bleed or other acute intracranial process.  Original Report Authenticated By: Osa Craver, M.D.   Mr Laqueta Jean WU Contrast  01/09/2011  *RADIOLOGY REPORT*  Clinical Data: Lower extremity weakness.  MRI HEAD WITHOUT AND WITH CONTRAST  Technique:  Multiplanar, multiecho pulse sequences of the brain and surrounding structures were obtained according to standard protocol without and with intravenous contrast  Contrast: 1 MULTIHANCE GADOBENATE DIMEGLUMINE 529 MG/ML IV SOLN  Comparison: CT 01/08/2011  Findings: Age appropriate atrophy.  Slight chronic microvascular ischemic change in the white matter.  Negative for acute infarct. Negative for hemorrhage or mass.  Postcontrast imaging the brain reveals normal enhancement.  Mild mucosal edema in the paranasal sinuses.  Joint effusion involving the right C1-C2 joint.  IMPRESSION: No acute intracranial abnormality.  Original Report Authenticated By: Camelia Phenes, M.D.   Mr Cervical Spine W Wo Contrast  01/09/2011  **ADDENDUM** CREATED: 01/09/2011 13:34:23  Critical Value/emergent results were called by telephone at the time of interpretation on 01/09/2011  at 1136 hours  to  Dr. Jerral Ralph, who verbally acknowledged these results.  **  END ADDENDUM** SIGNED BY: Andreas Newport, M.D.   01/09/2011  *RADIOLOGY REPORT*  Clinical Data:  Leg weakness.  Lower extremity weakness and inability to ambulate.  Spinal stenosis.  MRI CERVICAL SPINE WITHOUT AND WITH CONTRAST  Technique:  Multiplanar and multiecho pulse sequences of the cervic al spine, to include the craniocervical junction and cervicothoraci c junction, were obtained  according to standard protocol without an d with intravenous contrast.  Contrast: 15 ml Multihance  Comparison: None.  Findings:  Exaggeration of the normal cervical lordosis.  There appears to be ankylosis extending from C4-C6, with confluent anterior osteophytes. The appearance is compatible with diffuse idiopathic skeletal hyperostosis. Cervical cord edema is present associated with cervical cord compression at C6-C7.  There is probable ankylosis across C2-C3 as well.  Small right greater than left atlanto-occipital effusions are present.  Craniocervical junction appears normal. Nonspecific cystic appearing lesion is present in the right thyroid lobe measuring 15 mm.  C2-C3:  Central canal patent.  Left foraminal encroachment associated with facet arthrosis.  C3-C4:  Mild central stenosis associated with broad-based disc osteophyte complex.  Mild right posterior ligamentum flavum redundancy.  Disc osteophyte complex contacts the ventral aspect of the cervical cord and may flattened it slightly.  Right greater than left foraminal stenosis associated with uncovertebral spurring and facet arthrosis.  C4-C5:  Apparent ankylosis across the disc space.  Central canal and foramina appear patent.  C5-C6:  Facet hypertrophy.  Apparent ankylosis.  Mild central stenosis associated with osseous ridging.  No definite cord deformity.  C6-C7:  Severe central stenosis with compression of the cervical cord. Mild edema is present above and below the levels of compression.  Broad-based disc osteophyte complex and posterior ligamentum flavum redundancy produce the severe central stenosis. Bilateral foraminal stenosis secondary uncovertebral spurring. Bilateral facet effusions.  C7-T1:  Severe right facet arthrosis with right foraminal stenosis potentially affecting the right C8 nerve.  The foramen appears patent. Minimal central stenosis associated with posterior element hypertrophy and ligamentum flavum redundancy.  IMPRESSION: 1.   Cervical cord compression at C6-C7 with cord edema and / or myelomalacia.  Severe spinal stenosis due to broad-based disc osteophyte complex and posterior ligamentum flavum redundancy. 2.  Diffuse idiopathic skeletal hyperostosis with ankylosis from C4- C6, probably extending to C2 and C3 as well. 3.  Mild C3-C4 central stenosis secondary to disc osteophyte complex.  Right greater than left foraminal stenosis associated uncovertebral spurring.  MRI THORACIC SPINE WITHOUT AND WITH CONTRAST  Technique:  Multiplanar and multiecho pulse sequences of the thoracic spine were obtained without and with intravenous contrast.  Contrast: 15 MULTIHANCE GADOBENATE DIMEGLUMINE 529 MG/ML IV SOLN  Findings:  Mild dextroconvex curvature of the thoracic spine is present.  Remote compression fractures T7 and T8, with approximately 25% loss of vertebral body height.  No marrow edema. The thoracic cord demonstrates normal caliber and signal. Paraspinal soft tissues are within normal limits.  Scattered vertebral body hemangiomata are present.  High signal is present within the discs at T6-T7 and T7-T8 with post-gadolinium enhancement.  There is no endplate edema, erosion or enhancement and this is compatible with degenerative enhancement of the discs.  This may also be due to ossification of the discs with development of marrow space.  Thoracic spine DISH is present.  Tiny central disc protrusion is present at T7-T8, just contacting the ventral aspect of the thoracic cord.  Per CMS PQRS reporting requirements (PQRS Measure 24): Given the patient's age of greater than 69 and the  fracture site (hip, distal radius, or spine), the patient should be tested for osteoporosis using DXA, and the appropriate treatment considered based on the DXA results.  IMPRESSION: No thoracic cord compression.  Thoracic spine DISH and chronic compression fractures of T7 and T8 (25% loss of height) without retropulsion. Original Report Authenticated By: Andreas Newport, M.D.   Mr Thoracic Spine W Wo Contrast  01/09/2011  **ADDENDUM** CREATED: 01/09/2011 13:34:23  Critical Value/emergent results were called by telephone at the time of interpretation on 01/09/2011  at 1136 hours  to  Dr. Jerral Ralph, who verbally acknowledged these results.  **END ADDENDUM** SIGNED BY: Andreas Newport, M.D.   01/09/2011  *RADIOLOGY REPORT*  Clinical Data:  Leg weakness.  Lower extremity weakness and inability to ambulate.  Spinal stenosis.  MRI CERVICAL SPINE WITHOUT AND WITH CONTRAST  Technique:  Multiplanar and multiecho pulse sequences of the cervic al spine, to include the craniocervical junction and cervicothoraci c junction, were obtained according to standard protocol without an d with intravenous contrast.  Contrast: 15 ml Multihance  Comparison: None.  Findings:  Exaggeration of the normal cervical lordosis.  There appears to be ankylosis extending from C4-C6, with confluent anterior osteophytes. The appearance is compatible with diffuse idiopathic skeletal hyperostosis. Cervical cord edema is present associated with cervical cord compression at C6-C7.  There is probable ankylosis across C2-C3 as well.  Small right greater than left atlanto-occipital effusions are present.  Craniocervical junction appears normal. Nonspecific cystic appearing lesion is present in the right thyroid lobe measuring 15 mm.  C2-C3:  Central canal patent.  Left foraminal encroachment associated with facet arthrosis.  C3-C4:  Mild central stenosis associated with broad-based disc osteophyte complex.  Mild right posterior ligamentum flavum redundancy.  Disc osteophyte complex contacts the ventral aspect of the cervical cord and may flattened it slightly.  Right greater than left foraminal stenosis associated with uncovertebral spurring and facet arthrosis.  C4-C5:  Apparent ankylosis across the disc space.  Central canal and foramina appear patent.  C5-C6:  Facet hypertrophy.  Apparent ankylosis.  Mild  central stenosis associated with osseous ridging.  No definite cord deformity.  C6-C7:  Severe central stenosis with compression of the cervical cord. Mild edema is present above and below the levels of compression.  Broad-based disc osteophyte complex and posterior ligamentum flavum redundancy produce the severe central stenosis. Bilateral foraminal stenosis secondary uncovertebral spurring. Bilateral facet effusions.  C7-T1:  Severe right facet arthrosis with right foraminal stenosis potentially affecting the right C8 nerve.  The foramen appears patent. Minimal central stenosis associated with posterior element hypertrophy and ligamentum flavum redundancy.  IMPRESSION: 1.  Cervical cord compression at C6-C7 with cord edema and / or myelomalacia.  Severe spinal stenosis due to broad-based disc osteophyte complex and posterior ligamentum flavum redundancy. 2.  Diffuse idiopathic skeletal hyperostosis with ankylosis from C4- C6, probably extending to C2 and C3 as well. 3.  Mild C3-C4 central stenosis secondary to disc osteophyte complex.  Right greater than left foraminal stenosis associated uncovertebral spurring.  MRI THORACIC SPINE WITHOUT AND WITH CONTRAST  Technique:  Multiplanar and multiecho pulse sequences of the thoracic spine were obtained without and with intravenous contrast.  Contrast: 15 MULTIHANCE GADOBENATE DIMEGLUMINE 529 MG/ML IV SOLN  Findings:  Mild dextroconvex curvature of the thoracic spine is present.  Remote compression fractures T7 and T8, with approximately 25% loss of vertebral body height.  No marrow edema. The thoracic cord demonstrates normal caliber and signal. Paraspinal soft tissues are within  normal limits.  Scattered vertebral body hemangiomata are present.  High signal is present within the discs at T6-T7 and T7-T8 with post-gadolinium enhancement.  There is no endplate edema, erosion or enhancement and this is compatible with degenerative enhancement of the discs.  This may also  be due to ossification of the discs with development of marrow space.  Thoracic spine DISH is present.  Tiny central disc protrusion is present at T7-T8, just contacting the ventral aspect of the thoracic cord.  Per CMS PQRS reporting requirements (PQRS Measure 24): Given the patient's age of greater than 50 and the fracture site (hip, distal radius, or spine), the patient should be tested for osteoporosis using DXA, and the appropriate treatment considered based on the DXA results.  IMPRESSION: No thoracic cord compression.  Thoracic spine DISH and chronic compression fractures of T7 and T8 (25% loss of height) without retropulsion. Original Report Authenticated By: Andreas Newport, M.D.   Mr Lumbar Spine W Wo Contrast  01/08/2011  *RADIOLOGY REPORT*  Clinical Data: Bilateral lower extremity weakness.  History of prior lumbar spine surgery.  Progressive worsening weakness in the lower extremities.  MRI LUMBAR SPINE WITHOUT AND WITH CONTRAST  Technique:  Multiplanar and multiecho pulse sequences of the lumbar spine were obtained without and with intravenous contrast.  Contrast:  17 ml Multihance.  Comparison: MRI lumbar spine 09/14/2010.  Findings: Numbering convention used on prior exam preserved. Vertebral body height and marrow signal is within normal limits. Paraspinal soft tissues are within normal limits.  Distention of the urinary bladder is present.  No destructive osseous lesions. Postoperative changes of L4 laminectomy.  The small fluid collection in the operative bed seen on the recent comparison has resolved.  T12-L1:  Negative.  L1-L2:  Shallow posterior disc bulging without stenosis.  Mild facet hypertrophy and ligamentum flavum redundancy.  L2-L3:  Mild facet hypertrophy.  No stenosis.  L3-L4:  Bilateral facet hypertrophy and ligamentum flavum redundancy with mild central stenosis.  Unchanged mild symmetric bilateral foraminal stenosis.  L4-L5:  Laminectomy with wide posterior decompression.   Central canal and lateral recesses are patent.  The enhancing granulation tissue is present in the operative bed and the epidural space.  No epidural abscess or fluid collection.  Short pedicles are present with mild left foraminal stenosis.  Facet hypertrophy is present.  L5-S1:  Disc desiccation and degeneration.  Degenerative endplate changes are present.  Central canal and lateral recesses are patent.  Mild left foraminal stenosis associated with endplate spurring and facet hypertrophy.  The left greater than right facet degeneration/hypertrophy.  IMPRESSION:  1.  Evolving postoperative changes of L4 laminectomy with good decompression of the thecal sac and lateral recesses.  Mild left foraminal stenosis associated with short pedicles and facet hypertrophy potentially affecting the left L4 nerve. Resolution of small fluid collection in the operative bed.  Expected enhancing granulation tissue in the operative bed. 2.  Unchanged L5-S1 degenerative disease with mild left foraminal stenosis secondary endplate spurring and facet hypertrophy. 3.  Unchanged L3-L4 facet arthrosis with mild central stenosis.  Original Report Authenticated By: Andreas Newport, M.D.    Assessment/Plan: C6-7 spondylosis, stenosis, cervical myelopathy: I have again discussed situation with the patient. I have answered all his questions regarding surgery. I'll plan to do the surgery either tomorrow or Thursday.  LOS: 2 days     Evangeline Utley D 01/10/2011, 7:31 AM

## 2011-01-10 NOTE — Progress Notes (Signed)
Utilization Review Completed.Dorcas Carrow T11/27/2012

## 2011-01-10 NOTE — Progress Notes (Signed)
PATIENT DETAILS Name: Ricky Bradley Age: 77 y.o. Sex: male Date of Birth: 1934-01-19 Admit Date: 01/08/2011 EAV:[id number], Smith Robert, RN, Nursing/RN Coord  Subjective: No major events overnight. Wants to go back on his MiraLax.  Objective: Vital signs in last 24 hours: Filed Vitals:   01/09/11 1433 01/09/11 2140 01/10/11 0547 01/10/11 1126  BP: 125/76 120/68 123/72 140/70  Pulse: 70 86 81 80  Temp: 98.2 F (36.8 C) 97.6 F (36.4 C) 98.5 F (36.9 C)   TempSrc: Oral Oral Oral   Resp: 17 16 19    Height:   6' (1.829 m)   Weight:   81.466 kg (179 lb 9.6 oz)   SpO2: 98% 97% 96%     Weight change: 4.466 kg (9 lb 13.5 oz)  Body mass index is 24.36 kg/(m^2).  Intake/Output from previous day:  Intake/Output Summary (Last 24 hours) at 01/10/11 1420 Last data filed at 01/09/11 1800  Gross per 24 hour  Intake      0 ml  Output    650 ml  Net   -650 ml    PHYSICAL EXAM: Gen Exam: Awake and alert with clear speech.   Neck: Supple, No JVD.   Chest: B/L Clear.   CVS: S1 S2 Regular, no murmurs.  Abdomen: soft, BS +, non tender, non distended.  Extremities: no edema, lower extremities warm to touch. Neurologic: Unchanged neurological deficits as compared to yesterday. Skin: No Rash.   Wounds: N/A.    CONSULTS:  Neurosurgery and neurology  LAB RESULTS: CBC  Lab 01/09/11 0718 01/08/11 1847 01/08/11 1146  WBC 5.9 7.7 6.9  HGB 13.9 14.8 15.4  HCT 40.9 41.9 43.0  PLT 153 156 182  MCV 84.3 83.1 83.0  MCH 28.7 29.4 29.7  MCHC 34.0 35.3 35.8  RDW 14.2 13.9 13.7  LYMPHSABS -- -- 1.5  MONOABS -- -- 0.7  EOSABS -- -- 0.1  BASOSABS -- -- 0.0  BANDABS -- -- --    Chemistries   Lab 01/09/11 0718 01/08/11 1847 01/08/11 1146  NA 143 -- 141  K 3.6 -- 3.0*  CL 110 -- 103  CO2 26 -- 26  GLUCOSE 97 -- 111*  BUN 14 -- 12  CREATININE 0.95 0.88 1.02  CALCIUM 8.9 -- 9.8  MG -- -- --    GFR Estimated Creatinine Clearance: 73.7 ml/min (by C-G formula based on Cr of  0.95).  Coagulation profile No results found for this basename: INR:5,PROTIME:5 in the last 168 hours  Cardiac Enzymes No results found for this basename: CK:3,CKMB:3,TROPONINI:3,MYOGLOBIN:3 in the last 168 hours  No results found for this basename: POCBNP:3 in the last 168 hours No results found for this basename: DDIMER:2 in the last 72 hours No results found for this basename: HGBA1C:2 in the last 72 hours No results found for this basename: CHOL:2,HDL:2,LDLCALC:2,TRIG:2,CHOLHDL:2,LDLDIRECT:2 in the last 72 hours No results found for this basename: TSH,T4TOTAL,FREET3,T3FREE,THYROIDAB in the last 72 hours No results found for this basename: VITAMINB12:2,FOLATE:2,FERRITIN:2,TIBC:2,IRON:2,RETICCTPCT:2 in the last 72 hours No results found for this basename: LIPASE:2,AMYLASE:2 in the last 72 hours  Urine Studies No results found for this basename: UACOL:2,UAPR:2,USPG:2,UPH:2,UTP:2,UGL:2,UKET:2,UBIL:2,UHGB:2,UNIT:2,UROB:2,ULEU:2,UEPI:2,UWBC:2,URBC:2,UBAC:2,CAST:2,CRYS:2,UCOM:2,BILUA:2 in the last 72 hours  MICROBIOLOGY: No results found for this or any previous visit (from the past 240 hour(s)).  RADIOLOGY STUDIES/RESULTS: Dg Chest 2 View  01/08/2011  *RADIOLOGY REPORT*  Clinical Data: Numbness in both legs since yesterday.  Weakness.  CHEST - 2 VIEW  Comparison: CT 11/02/2010.  Plain films 08/18/2010.  Findings: Lateral view degraded by  patient arm position.  Mild thoracic spondylosis.  Apical lordotic frontal view. Midline trachea.  Normal heart size and mediastinal contours. No pleural effusion or pneumothorax.  Clear lungs.  IMPRESSION: Normal chest.  Original Report Authenticated By: Consuello Bossier, M.D.   Ct Head Wo Contrast  01/08/2011  *RADIOLOGY REPORT*  Clinical Data: Bilateral leg weakness  CT HEAD WITHOUT CONTRAST  Technique:  Contiguous axial images were obtained from the base of the skull through the vertex without contrast.  Comparison: 01/27/2007  Findings: Mild atrophy.  There is no evidence of acute intracranial hemorrhage, brain edema, mass lesion, acute infarction,   mass effect, or midline shift. Acute infarct may be inapparent on noncontrast CT.  No other intra-axial abnormalities are seen, and the ventricles and sulci are within normal limits in size and symmetry.   No abnormal extra-axial fluid collections or masses are identified.  No significant calvarial abnormality.  IMPRESSION: 1. Negative for bleed or other acute intracranial process.  Original Report Authenticated By: Osa Craver, M.D.   Mr Laqueta Jean WU Contrast  01/09/2011  *RADIOLOGY REPORT*  Clinical Data: Lower extremity weakness.  MRI HEAD WITHOUT AND WITH CONTRAST  Technique:  Multiplanar, multiecho pulse sequences of the brain and surrounding structures were obtained according to standard protocol without and with intravenous contrast  Contrast: 1 MULTIHANCE GADOBENATE DIMEGLUMINE 529 MG/ML IV SOLN  Comparison: CT 01/08/2011  Findings: Age appropriate atrophy.  Slight chronic microvascular ischemic change in the white matter.  Negative for acute infarct. Negative for hemorrhage or mass.  Postcontrast imaging the brain reveals normal enhancement.  Mild mucosal edema in the paranasal sinuses.  Joint effusion involving the right C1-C2 joint.  IMPRESSION: No acute intracranial abnormality.  Original Report Authenticated By: Camelia Phenes, M.D.   Mr Cervical Spine W Wo Contrast  01/09/2011  **ADDENDUM** CREATED: 01/09/2011 13:34:23  Critical Value/emergent results were called by telephone at the time of interpretation on 01/09/2011  at 1136 hours  to  Dr. Jerral Ralph, who verbally acknowledged these results.  **END ADDENDUM** SIGNED BY: Andreas Newport, M.D.   01/09/2011  *RADIOLOGY REPORT*  Clinical Data:  Leg weakness.  Lower extremity weakness and inability to ambulate.  Spinal stenosis.  MRI CERVICAL SPINE WITHOUT AND WITH CONTRAST  Technique:  Multiplanar and multiecho pulse sequences of the  cervic al spine, to include the craniocervical junction and cervicothoraci c junction, were obtained according to standard protocol without an d with intravenous contrast.  Contrast: 15 ml Multihance  Comparison: None.  Findings:  Exaggeration of the normal cervical lordosis.  There appears to be ankylosis extending from C4-C6, with confluent anterior osteophytes. The appearance is compatible with diffuse idiopathic skeletal hyperostosis. Cervical cord edema is present associated with cervical cord compression at C6-C7.  There is probable ankylosis across C2-C3 as well.  Small right greater than left atlanto-occipital effusions are present.  Craniocervical junction appears normal. Nonspecific cystic appearing lesion is present in the right thyroid lobe measuring 15 mm.  C2-C3:  Central canal patent.  Left foraminal encroachment associated with facet arthrosis.  C3-C4:  Mild central stenosis associated with broad-based disc osteophyte complex.  Mild right posterior ligamentum flavum redundancy.  Disc osteophyte complex contacts the ventral aspect of the cervical cord and may flattened it slightly.  Right greater than left foraminal stenosis associated with uncovertebral spurring and facet arthrosis.  C4-C5:  Apparent ankylosis across the disc space.  Central canal and foramina appear patent.  C5-C6:  Facet hypertrophy.  Apparent ankylosis.  Mild central stenosis associated with osseous ridging.  No definite cord deformity.  C6-C7:  Severe central stenosis with compression of the cervical cord. Mild edema is present above and below the levels of compression.  Broad-based disc osteophyte complex and posterior ligamentum flavum redundancy produce the severe central stenosis. Bilateral foraminal stenosis secondary uncovertebral spurring. Bilateral facet effusions.  C7-T1:  Severe right facet arthrosis with right foraminal stenosis potentially affecting the right C8 nerve.  The foramen appears patent. Minimal central  stenosis associated with posterior element hypertrophy and ligamentum flavum redundancy.  IMPRESSION: 1.  Cervical cord compression at C6-C7 with cord edema and / or myelomalacia.  Severe spinal stenosis due to broad-based disc osteophyte complex and posterior ligamentum flavum redundancy. 2.  Diffuse idiopathic skeletal hyperostosis with ankylosis from C4- C6, probably extending to C2 and C3 as well. 3.  Mild C3-C4 central stenosis secondary to disc osteophyte complex.  Right greater than left foraminal stenosis associated uncovertebral spurring.  MRI THORACIC SPINE WITHOUT AND WITH CONTRAST  Technique:  Multiplanar and multiecho pulse sequences of the thoracic spine were obtained without and with intravenous contrast.  Contrast: 15 MULTIHANCE GADOBENATE DIMEGLUMINE 529 MG/ML IV SOLN  Findings:  Mild dextroconvex curvature of the thoracic spine is present.  Remote compression fractures T7 and T8, with approximately 25% loss of vertebral body height.  No marrow edema. The thoracic cord demonstrates normal caliber and signal. Paraspinal soft tissues are within normal limits.  Scattered vertebral body hemangiomata are present.  High signal is present within the discs at T6-T7 and T7-T8 with post-gadolinium enhancement.  There is no endplate edema, erosion or enhancement and this is compatible with degenerative enhancement of the discs.  This may also be due to ossification of the discs with development of marrow space.  Thoracic spine DISH is present.  Tiny central disc protrusion is present at T7-T8, just contacting the ventral aspect of the thoracic cord.  Per CMS PQRS reporting requirements (PQRS Measure 24): Given the patient's age of greater than 50 and the fracture site (hip, distal radius, or spine), the patient should be tested for osteoporosis using DXA, and the appropriate treatment considered based on the DXA results.  IMPRESSION: No thoracic cord compression.  Thoracic spine DISH and chronic compression  fractures of T7 and T8 (25% loss of height) without retropulsion. Original Report Authenticated By: Andreas Newport, M.D.   Mr Thoracic Spine W Wo Contrast  01/09/2011  **ADDENDUM** CREATED: 01/09/2011 13:34:23  Critical Value/emergent results were called by telephone at the time of interpretation on 01/09/2011  at 1136 hours  to  Dr. Jerral Ralph, who verbally acknowledged these results.  **END ADDENDUM** SIGNED BY: Andreas Newport, M.D.   01/09/2011  *RADIOLOGY REPORT*  Clinical Data:  Leg weakness.  Lower extremity weakness and inability to ambulate.  Spinal stenosis.  MRI CERVICAL SPINE WITHOUT AND WITH CONTRAST  Technique:  Multiplanar and multiecho pulse sequences of the cervic al spine, to include the craniocervical junction and cervicothoraci c junction, were obtained according to standard protocol without an d with intravenous contrast.  Contrast: 15 ml Multihance  Comparison: None.  Findings:  Exaggeration of the normal cervical lordosis.  There appears to be ankylosis extending from C4-C6, with confluent anterior osteophytes. The appearance is compatible with diffuse idiopathic skeletal hyperostosis. Cervical cord edema is present associated with cervical cord compression at C6-C7.  There is probable ankylosis across C2-C3 as well.  Small right greater than left atlanto-occipital effusions are present.  Craniocervical junction appears normal. Nonspecific  cystic appearing lesion is present in the right thyroid lobe measuring 15 mm.  C2-C3:  Central canal patent.  Left foraminal encroachment associated with facet arthrosis.  C3-C4:  Mild central stenosis associated with broad-based disc osteophyte complex.  Mild right posterior ligamentum flavum redundancy.  Disc osteophyte complex contacts the ventral aspect of the cervical cord and may flattened it slightly.  Right greater than left foraminal stenosis associated with uncovertebral spurring and facet arthrosis.  C4-C5:  Apparent ankylosis across the disc  space.  Central canal and foramina appear patent.  C5-C6:  Facet hypertrophy.  Apparent ankylosis.  Mild central stenosis associated with osseous ridging.  No definite cord deformity.  C6-C7:  Severe central stenosis with compression of the cervical cord. Mild edema is present above and below the levels of compression.  Broad-based disc osteophyte complex and posterior ligamentum flavum redundancy produce the severe central stenosis. Bilateral foraminal stenosis secondary uncovertebral spurring. Bilateral facet effusions.  C7-T1:  Severe right facet arthrosis with right foraminal stenosis potentially affecting the right C8 nerve.  The foramen appears patent. Minimal central stenosis associated with posterior element hypertrophy and ligamentum flavum redundancy.  IMPRESSION: 1.  Cervical cord compression at C6-C7 with cord edema and / or myelomalacia.  Severe spinal stenosis due to broad-based disc osteophyte complex and posterior ligamentum flavum redundancy. 2.  Diffuse idiopathic skeletal hyperostosis with ankylosis from C4- C6, probably extending to C2 and C3 as well. 3.  Mild C3-C4 central stenosis secondary to disc osteophyte complex.  Right greater than left foraminal stenosis associated uncovertebral spurring.  MRI THORACIC SPINE WITHOUT AND WITH CONTRAST  Technique:  Multiplanar and multiecho pulse sequences of the thoracic spine were obtained without and with intravenous contrast.  Contrast: 15 MULTIHANCE GADOBENATE DIMEGLUMINE 529 MG/ML IV SOLN  Findings:  Mild dextroconvex curvature of the thoracic spine is present.  Remote compression fractures T7 and T8, with approximately 25% loss of vertebral body height.  No marrow edema. The thoracic cord demonstrates normal caliber and signal. Paraspinal soft tissues are within normal limits.  Scattered vertebral body hemangiomata are present.  High signal is present within the discs at T6-T7 and T7-T8 with post-gadolinium enhancement.  There is no endplate edema,  erosion or enhancement and this is compatible with degenerative enhancement of the discs.  This may also be due to ossification of the discs with development of marrow space.  Thoracic spine DISH is present.  Tiny central disc protrusion is present at T7-T8, just contacting the ventral aspect of the thoracic cord.  Per CMS PQRS reporting requirements (PQRS Measure 24): Given the patient's age of greater than 50 and the fracture site (hip, distal radius, or spine), the patient should be tested for osteoporosis using DXA, and the appropriate treatment considered based on the DXA results.  IMPRESSION: No thoracic cord compression.  Thoracic spine DISH and chronic compression fractures of T7 and T8 (25% loss of height) without retropulsion. Original Report Authenticated By: Andreas Newport, M.D.   Mr Lumbar Spine W Wo Contrast  01/08/2011  *RADIOLOGY REPORT*  Clinical Data: Bilateral lower extremity weakness.  History of prior lumbar spine surgery.  Progressive worsening weakness in the lower extremities.  MRI LUMBAR SPINE WITHOUT AND WITH CONTRAST  Technique:  Multiplanar and multiecho pulse sequences of the lumbar spine were obtained without and with intravenous contrast.  Contrast:  17 ml Multihance.  Comparison: MRI lumbar spine 09/14/2010.  Findings: Numbering convention used on prior exam preserved. Vertebral body height and marrow signal is within normal  limits. Paraspinal soft tissues are within normal limits.  Distention of the urinary bladder is present.  No destructive osseous lesions. Postoperative changes of L4 laminectomy.  The small fluid collection in the operative bed seen on the recent comparison has resolved.  T12-L1:  Negative.  L1-L2:  Shallow posterior disc bulging without stenosis.  Mild facet hypertrophy and ligamentum flavum redundancy.  L2-L3:  Mild facet hypertrophy.  No stenosis.  L3-L4:  Bilateral facet hypertrophy and ligamentum flavum redundancy with mild central stenosis.  Unchanged  mild symmetric bilateral foraminal stenosis.  L4-L5:  Laminectomy with wide posterior decompression.  Central canal and lateral recesses are patent.  The enhancing granulation tissue is present in the operative bed and the epidural space.  No epidural abscess or fluid collection.  Short pedicles are present with mild left foraminal stenosis.  Facet hypertrophy is present.  L5-S1:  Disc desiccation and degeneration.  Degenerative endplate changes are present.  Central canal and lateral recesses are patent.  Mild left foraminal stenosis associated with endplate spurring and facet hypertrophy.  The left greater than right facet degeneration/hypertrophy.  IMPRESSION:  1.  Evolving postoperative changes of L4 laminectomy with good decompression of the thecal sac and lateral recesses.  Mild left foraminal stenosis associated with short pedicles and facet hypertrophy potentially affecting the left L4 nerve. Resolution of small fluid collection in the operative bed.  Expected enhancing granulation tissue in the operative bed. 2.  Unchanged L5-S1 degenerative disease with mild left foraminal stenosis secondary endplate spurring and facet hypertrophy. 3.  Unchanged L3-L4 facet arthrosis with mild central stenosis.  Original Report Authenticated By: Andreas Newport, M.D.    MEDICATIONS: Scheduled Meds:   . aspirin EC  81 mg Oral Daily  . bethanechol  25 mg Oral BID  . ceFAZolin (ANCEF) IV  1 g Intravenous 60 min Pre-Op  . citalopram  10 mg Oral Daily  . dexamethasone  8 mg Intravenous Q6H  . dicyclomine  10 mg Oral TID AC & HS  . enoxaparin  40 mg Subcutaneous Q24H  . loratadine  10 mg Oral Daily  . LORazepam      . LORazepam  1 mg Intravenous Once  . metoprolol succinate  12.5 mg Oral Daily  . pantoprazole  40 mg Oral Daily  . polyethylene glycol  17 g Oral BID  . simvastatin  20 mg Oral q1800  . Tamsulosin HCl  0.4 mg Oral BID  . DISCONTD: dexamethasone  6 mg Intravenous Q6H  . DISCONTD: dexamethasone  8  mg Intravenous Q6H  . DISCONTD: dexamethasone  8 mg Intravenous Q6H   Continuous Infusions:  PRN Meds:.acetaminophen, acetaminophen, ondansetron (ZOFRAN) IV, ondansetron, oxyCODONE  Antibiotics: Anti-infectives     Start     Dose/Rate Route Frequency Ordered Stop   01/11/11 0000   ceFAZolin (ANCEF) IVPB 1 g/50 mL premix        1 g 100 mL/hr over 30 Minutes Intravenous 60 min pre-op 01/10/11 1230            Assessment/Plan: Patient Active Hospital Problem List: Cord compression    Assessment: this is in the cervical region, and is secondary to DISH. Unchanged neurological deficits.    Plan: appreciate neurosurgical input, we'll continue with Decadron. Surgery planned for tomorrow or day after tomorrow.   HTN (hypertension) Assessment: Controlled  Plan: Continue with metoprolol.   Dyslipidemia  Assessment: Stable  Plan: Continue with Zocor   BPH (benign prostatic hyperplasia)  Assessment: An ongoing issue, but no difficulty  urinating.  Plan: Continue with Flomax and bethanechol.   GERD (gastroesophageal reflux disease)  Assessment: Stable  Plan: Continue with Protonix.   Lumbar stenosis with neurogenic claudication    Assessment: Stable, this is not the cause of patient's symptoms. MRI of the lumbar spine did not show significant pathology in this area.    Plan: monitor.   Irritable bowel syndrome (IBS)    Assessment: Constipated.    Plan: Resume MiraLax.   Disposition:  remain inpatient.   DVT Prophylaxis: PROPHYLACTIC LOVENOX   Code Status:  full code   Maretta Bees, MD. 01/10/2011, 2:20 PM

## 2011-01-11 ENCOUNTER — Inpatient Hospital Stay (HOSPITAL_COMMUNITY): Payer: Medicare Other

## 2011-01-11 LAB — ABO/RH: ABO/RH(D): O NEG

## 2011-01-11 LAB — TYPE AND SCREEN: Antibody Screen: NEGATIVE

## 2011-01-11 MED ORDER — DEXAMETHASONE SODIUM PHOSPHATE 10 MG/ML IJ SOLN
8.0000 mg | Freq: Four times a day (QID) | INTRAMUSCULAR | Status: DC
  Administered 2011-01-11 – 2011-01-12 (×5): 8 mg via INTRAVENOUS
  Administered 2011-01-12 (×2): via INTRAVENOUS
  Administered 2011-01-13 (×2): 8 mg via INTRAVENOUS
  Administered 2011-01-13 (×2): via INTRAVENOUS
  Administered 2011-01-14 – 2011-01-22 (×36): 8 mg via INTRAVENOUS
  Filled 2011-01-11 (×54): qty 0.8

## 2011-01-11 NOTE — Plan of Care (Signed)
Problem: Phase I Progression Outcomes Goal: Voiding-avoid urinary catheter unless indicated Outcome: Completed/Met Date Met:  01/11/11 Pt has bladder distention and has foley cathether

## 2011-01-11 NOTE — Progress Notes (Signed)
Patient ID: Ricky Bradley, male   DOB: 1933-08-14, 77 y.o.   MRN: [id number] Subjective:  The patient is alert and pleasant. He is accompanied by his daughter.  Objective: Vital signs in last 24 hours: Temp:  [98.1 F (36.7 C)-98.7 F (37.1 C)] 98.7 F (37.1 C) (11/28 1400) Pulse Rate:  [69-81] 69  (11/28 1400) Resp:  [18-20] 18  (11/28 1400) BP: (135-156)/(67-83) 135/67 mmHg (11/28 1400) SpO2:  [92 %-98 %] 98 % (11/28 1400)  Intake/Output from previous day: 11/27 0701 - 11/28 0700 In: -  Out: 2000 [Urine:2000] Intake/Output this shift: Total I/O In: 600 [P.O.:600] Out: 2450 [Urine:2450]  Physical exam the patient is alert and oriented. He is moving all 4 extremities well.  Lab Results:  [facility] 01/09/11 0718 01/08/11 1847  WBC 5.9 7.7  HGB 13.9 14.8  HCT 40.9 41.9  PLT 153 156   BMET  Basename 01/09/11 0718 01/08/11 1847  NA 143 --  K 3.6 --  CL 110 --  CO2 26 --  GLUCOSE 97 --  BUN 14 --  CREATININE 0.95 0.88  CALCIUM 8.9 --    Studies/Results: No results found.  Assessment/Plan: C6-7 spondylosis stenosis cervical myelopathy/radiculopathy cervicalgia: I begin discussed situation with the patient and his daughter. We have discussed the various treatment options. I answered all her questions regarding surgery. They want to proceed with a C6-7 anterior cervical discectomy fusion and plating as scheduled tomorrow.  LOS: 3 days     Ricky Bradley D 01/11/2011, 6:30 PM

## 2011-01-11 NOTE — Progress Notes (Signed)
PATIENT DETAILS Name: Ricky Bradley Age: 77 y.o. Sex: male Date of Birth: 1933/11/01 Admit Date: 01/08/2011 VHQ:[id number], Smith Robert, RN, Nursing/RN Coord  Subjective: Anxious, as scheduled for surgery today..  Objective: Vital signs in last 24 hours: Filed Vitals:   01/10/11 1126 01/10/11 1400 01/10/11 2200 01/11/11 0538  BP: 140/70 100/61 156/74 139/83  Pulse: 80 85 73 81  Temp:  98.6 F (37 C) 98.7 F (37.1 C) 98.1 F (36.7 C)  TempSrc:  Oral Oral Oral  Resp:  17 18 20   Height:      Weight:      SpO2:  98% 96% 92%    Weight change:   Body mass index is 24.36 kg/(m^2).  Intake/Output from previous day:  Intake/Output Summary (Last 24 hours) at 01/11/11 1435 Last data filed at 01/11/11 1225  Gross per 24 hour  Intake      0 ml  Output   3650 ml  Net  -3650 ml    PHYSICAL EXAM: Gen Exam: Awake and alert with clear speech.  Somewhat anxious Neck: Supple, No JVD.   Chest: B/L Clear.  No added sounds. CVS: S1 S2 Regular, no murmurs.  Abdomen: soft, BS +, non tender, non distended.  Extremities: no edema, lower extremities warm to touch. Neurologic: Unchanged neurological deficits  Skin: No Rash.   Wounds: N/A.    CONSULTS:  Neurosurgery and neurology  LAB RESULTS: CBC  Lab 01/09/11 0718 01/08/11 1847 01/08/11 1146  WBC 5.9 7.7 6.9  HGB 13.9 14.8 15.4  HCT 40.9 41.9 43.0  PLT 153 156 182  MCV 84.3 83.1 83.0  MCH 28.7 29.4 29.7  MCHC 34.0 35.3 35.8  RDW 14.2 13.9 13.7  LYMPHSABS -- -- 1.5  MONOABS -- -- 0.7  EOSABS -- -- 0.1  BASOSABS -- -- 0.0  BANDABS -- -- --    Chemistries   Lab 01/09/11 0718 01/08/11 1847 01/08/11 1146  NA 143 -- 141  K 3.6 -- 3.0*  CL 110 -- 103  CO2 26 -- 26  GLUCOSE 97 -- 111*  BUN 14 -- 12  CREATININE 0.95 0.88 1.02  CALCIUM 8.9 -- 9.8  MG -- -- --    GFR Estimated Creatinine Clearance: 73.7 ml/min (by C-G formula based on Cr of 0.95).  Coagulation profile No results found for this basename: INR:5,PROTIME:5  in the last 168 hours  Cardiac Enzymes No results found for this basename: CK:3,CKMB:3,TROPONINI:3,MYOGLOBIN:3 in the last 168 hours  No results found for this basename: POCBNP:3 in the last 168 hours No results found for this basename: DDIMER:2 in the last 72 hours No results found for this basename: HGBA1C:2 in the last 72 hours No results found for this basename: CHOL:2,HDL:2,LDLCALC:2,TRIG:2,CHOLHDL:2,LDLDIRECT:2 in the last 72 hours No results found for this basename: TSH,T4TOTAL,FREET3,T3FREE,THYROIDAB in the last 72 hours No results found for this basename: VITAMINB12:2,FOLATE:2,FERRITIN:2,TIBC:2,IRON:2,RETICCTPCT:2 in the last 72 hours No results found for this basename: LIPASE:2,AMYLASE:2 in the last 72 hours  Urine Studies No results found for this basename: UACOL:2,UAPR:2,USPG:2,UPH:2,UTP:2,UGL:2,UKET:2,UBIL:2,UHGB:2,UNIT:2,UROB:2,ULEU:2,UEPI:2,UWBC:2,URBC:2,UBAC:2,CAST:2,CRYS:2,UCOM:2,BILUA:2 in the last 72 hours  MICROBIOLOGY: No results found for this or any previous visit (from the past 240 hour(s)).  RADIOLOGY STUDIES/RESULTS: Dg Chest 2 View  01/08/2011  *RADIOLOGY REPORT*  Clinical Data: Numbness in both legs since yesterday.  Weakness.  CHEST - 2 VIEW  Comparison: CT 11/02/2010.  Plain films 08/18/2010.  Findings: Lateral view degraded by patient arm position.  Mild thoracic spondylosis.  Apical lordotic frontal view. Midline trachea.  Normal heart size and  mediastinal contours. No pleural effusion or pneumothorax.  Clear lungs.  IMPRESSION: Normal chest.  Original Report Authenticated By: Consuello Bossier, M.D.   Ct Head Wo Contrast  01/08/2011  *RADIOLOGY REPORT*  Clinical Data: Bilateral leg weakness  CT HEAD WITHOUT CONTRAST  Technique:  Contiguous axial images were obtained from the base of the skull through the vertex without contrast.  Comparison: 01/27/2007  Findings: Mild atrophy. There is no evidence of acute intracranial hemorrhage, brain edema, mass lesion,  acute infarction,   mass effect, or midline shift. Acute infarct may be inapparent on noncontrast CT.  No other intra-axial abnormalities are seen, and the ventricles and sulci are within normal limits in size and symmetry.   No abnormal extra-axial fluid collections or masses are identified.  No significant calvarial abnormality.  IMPRESSION: 1. Negative for bleed or other acute intracranial process.  Original Report Authenticated By: Osa Craver, M.D.   [name] Contrast  01/09/2011  *RADIOLOGY REPORT*  Clinical Data: Lower extremity weakness.  MRI HEAD WITHOUT AND WITH CONTRAST  Technique:  Multiplanar, multiecho pulse sequences of the brain and surrounding structures were obtained according to standard protocol without and with intravenous contrast  Contrast: 1 MULTIHANCE GADOBENATE DIMEGLUMINE 529 MG/ML IV SOLN  Comparison: CT 01/08/2011  Findings: Age appropriate atrophy.  Slight chronic microvascular ischemic change in the white matter.  Negative for acute infarct. Negative for hemorrhage or mass.  Postcontrast imaging the brain reveals normal enhancement.  Mild mucosal edema in the paranasal sinuses.  Joint effusion involving the right C1-C2 joint.  IMPRESSION: No acute intracranial abnormality.  Original Report Authenticated By: Camelia Phenes, M.D.   Mr Cervical Spine W Wo Contrast  01/09/2011  **ADDENDUM** CREATED: 01/09/2011 13:34:23  Critical Value/emergent results were called by telephone at the time of interpretation on 01/09/2011  at 1136 hours  to  Dr. Jerral Ralph, who verbally acknowledged these results.  **END ADDENDUM** SIGNED BY: Andreas Newport, M.D.   01/09/2011  *RADIOLOGY REPORT*  Clinical Data:  Leg weakness.  Lower extremity weakness and inability to ambulate.  Spinal stenosis.  MRI CERVICAL SPINE WITHOUT AND WITH CONTRAST  Technique:  Multiplanar and multiecho pulse sequences of the cervic al spine, to include the craniocervical junction and cervicothoraci c junction,  were obtained according to standard protocol without an d with intravenous contrast.  Contrast: 15 ml Multihance  Comparison: None.  Findings:  Exaggeration of the normal cervical lordosis.  There appears to be ankylosis extending from C4-C6, with confluent anterior osteophytes. The appearance is compatible with diffuse idiopathic skeletal hyperostosis. Cervical cord edema is present associated with cervical cord compression at C6-C7.  There is probable ankylosis across C2-C3 as well.  Small right greater than left atlanto-occipital effusions are present.  Craniocervical junction appears normal. Nonspecific cystic appearing lesion is present in the right thyroid lobe measuring 15 mm.  C2-C3:  Central canal patent.  Left foraminal encroachment associated with facet arthrosis.  C3-C4:  Mild central stenosis associated with broad-based disc osteophyte complex.  Mild right posterior ligamentum flavum redundancy.  Disc osteophyte complex contacts the ventral aspect of the cervical cord and may flattened it slightly.  Right greater than left foraminal stenosis associated with uncovertebral spurring and facet arthrosis.  C4-C5:  Apparent ankylosis across the disc space.  Central canal and foramina appear patent.  C5-C6:  Facet hypertrophy.  Apparent ankylosis.  Mild central stenosis associated with osseous ridging.  No definite cord deformity.  C6-C7:  Severe central stenosis  with compression of the cervical cord. Mild edema is present above and below the levels of compression.  Broad-based disc osteophyte complex and posterior ligamentum flavum redundancy produce the severe central stenosis. Bilateral foraminal stenosis secondary uncovertebral spurring. Bilateral facet effusions.  C7-T1:  Severe right facet arthrosis with right foraminal stenosis potentially affecting the right C8 nerve.  The foramen appears patent. Minimal central stenosis associated with posterior element hypertrophy and ligamentum flavum redundancy.   IMPRESSION: 1.  Cervical cord compression at C6-C7 with cord edema and / or myelomalacia.  Severe spinal stenosis due to broad-based disc osteophyte complex and posterior ligamentum flavum redundancy. 2.  Diffuse idiopathic skeletal hyperostosis with ankylosis from C4- C6, probably extending to C2 and C3 as well. 3.  Mild C3-C4 central stenosis secondary to disc osteophyte complex.  Right greater than left foraminal stenosis associated uncovertebral spurring.  MRI THORACIC SPINE WITHOUT AND WITH CONTRAST  Technique:  Multiplanar and multiecho pulse sequences of the thoracic spine were obtained without and with intravenous contrast.  Contrast: 15 MULTIHANCE GADOBENATE DIMEGLUMINE 529 MG/ML IV SOLN  Findings:  Mild dextroconvex curvature of the thoracic spine is present.  Remote compression fractures T7 and T8, with approximately 25% loss of vertebral body height.  No marrow edema. The thoracic cord demonstrates normal caliber and signal. Paraspinal soft tissues are within normal limits.  Scattered vertebral body hemangiomata are present.  High signal is present within the discs at T6-T7 and T7-T8 with post-gadolinium enhancement.  There is no endplate edema, erosion or enhancement and this is compatible with degenerative enhancement of the discs.  This may also be due to ossification of the discs with development of marrow space.  Thoracic spine DISH is present.  Tiny central disc protrusion is present at T7-T8, just contacting the ventral aspect of the thoracic cord.  Per CMS PQRS reporting requirements (PQRS Measure 24): Given the patient's age of greater than 50 and the fracture site (hip, distal radius, or spine), the patient should be tested for osteoporosis using DXA, and the appropriate treatment considered based on the DXA results.  IMPRESSION: No thoracic cord compression.  Thoracic spine DISH and chronic compression fractures of T7 and T8 (25% loss of height) without retropulsion. Original Report  Authenticated By: Andreas Newport, M.D.   Mr Thoracic Spine W Wo Contrast  01/09/2011  **ADDENDUM** CREATED: 01/09/2011 13:34:23  Critical Value/emergent results were called by telephone at the time of interpretation on 01/09/2011  at 1136 hours  to  Dr. Jerral Ralph, who verbally acknowledged these results.  **END ADDENDUM** SIGNED BY: Andreas Newport, M.D.   01/09/2011  *RADIOLOGY REPORT*  Clinical Data:  Leg weakness.  Lower extremity weakness and inability to ambulate.  Spinal stenosis.  MRI CERVICAL SPINE WITHOUT AND WITH CONTRAST  Technique:  Multiplanar and multiecho pulse sequences of the cervic al spine, to include the craniocervical junction and cervicothoraci c junction, were obtained according to standard protocol without an d with intravenous contrast.  Contrast: 15 ml Multihance  Comparison: None.  Findings:  Exaggeration of the normal cervical lordosis.  There appears to be ankylosis extending from C4-C6, with confluent anterior osteophytes. The appearance is compatible with diffuse idiopathic skeletal hyperostosis. Cervical cord edema is present associated with cervical cord compression at C6-C7.  There is probable ankylosis across C2-C3 as well.  Small right greater than left atlanto-occipital effusions are present.  Craniocervical junction appears normal. Nonspecific cystic appearing lesion is present in the right thyroid lobe measuring 15 mm.  C2-C3:  Central canal  patent.  Left foraminal encroachment associated with facet arthrosis.  C3-C4:  Mild central stenosis associated with broad-based disc osteophyte complex.  Mild right posterior ligamentum flavum redundancy.  Disc osteophyte complex contacts the ventral aspect of the cervical cord and may flattened it slightly.  Right greater than left foraminal stenosis associated with uncovertebral spurring and facet arthrosis.  C4-C5:  Apparent ankylosis across the disc space.  Central canal and foramina appear patent.  C5-C6:  Facet hypertrophy.   Apparent ankylosis.  Mild central stenosis associated with osseous ridging.  No definite cord deformity.  C6-C7:  Severe central stenosis with compression of the cervical cord. Mild edema is present above and below the levels of compression.  Broad-based disc osteophyte complex and posterior ligamentum flavum redundancy produce the severe central stenosis. Bilateral foraminal stenosis secondary uncovertebral spurring. Bilateral facet effusions.  C7-T1:  Severe right facet arthrosis with right foraminal stenosis potentially affecting the right C8 nerve.  The foramen appears patent. Minimal central stenosis associated with posterior element hypertrophy and ligamentum flavum redundancy.  IMPRESSION: 1.  Cervical cord compression at C6-C7 with cord edema and / or myelomalacia.  Severe spinal stenosis due to broad-based disc osteophyte complex and posterior ligamentum flavum redundancy. 2.  Diffuse idiopathic skeletal hyperostosis with ankylosis from C4- C6, probably extending to C2 and C3 as well. 3.  Mild C3-C4 central stenosis secondary to disc osteophyte complex.  Right greater than left foraminal stenosis associated uncovertebral spurring.  MRI THORACIC SPINE WITHOUT AND WITH CONTRAST  Technique:  Multiplanar and multiecho pulse sequences of the thoracic spine were obtained without and with intravenous contrast.  Contrast: 15 MULTIHANCE GADOBENATE DIMEGLUMINE 529 MG/ML IV SOLN  Findings:  Mild dextroconvex curvature of the thoracic spine is present.  Remote compression fractures T7 and T8, with approximately 25% loss of vertebral body height.  No marrow edema. The thoracic cord demonstrates normal caliber and signal. Paraspinal soft tissues are within normal limits.  Scattered vertebral body hemangiomata are present.  High signal is present within the discs at T6-T7 and T7-T8 with post-gadolinium enhancement.  There is no endplate edema, erosion or enhancement and this is compatible with degenerative enhancement of  the discs.  This may also be due to ossification of the discs with development of marrow space.  Thoracic spine DISH is present.  Tiny central disc protrusion is present at T7-T8, just contacting the ventral aspect of the thoracic cord.  Per CMS PQRS reporting requirements (PQRS Measure 24): Given the patient's age of greater than 50 and the fracture site (hip, distal radius, or spine), the patient should be tested for osteoporosis using DXA, and the appropriate treatment considered based on the DXA results.  IMPRESSION: No thoracic cord compression.  Thoracic spine DISH and chronic compression fractures of T7 and T8 (25% loss of height) without retropulsion. Original Report Authenticated By: Andreas Newport, M.D.   Mr Lumbar Spine W Wo Contrast  01/08/2011  *RADIOLOGY REPORT*  Clinical Data: Bilateral lower extremity weakness.  History of prior lumbar spine surgery.  Progressive worsening weakness in the lower extremities.  MRI LUMBAR SPINE WITHOUT AND WITH CONTRAST  Technique:  Multiplanar and multiecho pulse sequences of the lumbar spine were obtained without and with intravenous contrast.  Contrast:  17 ml Multihance.  Comparison: MRI lumbar spine 09/14/2010.  Findings: Numbering convention used on prior exam preserved. Vertebral body height and marrow signal is within normal limits. Paraspinal soft tissues are within normal limits.  Distention of the urinary bladder is present.  No  destructive osseous lesions. Postoperative changes of L4 laminectomy.  The small fluid collection in the operative bed seen on the recent comparison has resolved.  T12-L1:  Negative.  L1-L2:  Shallow posterior disc bulging without stenosis.  Mild facet hypertrophy and ligamentum flavum redundancy.  L2-L3:  Mild facet hypertrophy.  No stenosis.  L3-L4:  Bilateral facet hypertrophy and ligamentum flavum redundancy with mild central stenosis.  Unchanged mild symmetric bilateral foraminal stenosis.  L4-L5:  Laminectomy with wide  posterior decompression.  Central canal and lateral recesses are patent.  The enhancing granulation tissue is present in the operative bed and the epidural space.  No epidural abscess or fluid collection.  Short pedicles are present with mild left foraminal stenosis.  Facet hypertrophy is present.  L5-S1:  Disc desiccation and degeneration.  Degenerative endplate changes are present.  Central canal and lateral recesses are patent.  Mild left foraminal stenosis associated with endplate spurring and facet hypertrophy.  The left greater than right facet degeneration/hypertrophy.  IMPRESSION:  1.  Evolving postoperative changes of L4 laminectomy with good decompression of the thecal sac and lateral recesses.  Mild left foraminal stenosis associated with short pedicles and facet hypertrophy potentially affecting the left L4 nerve. Resolution of small fluid collection in the operative bed.  Expected enhancing granulation tissue in the operative bed. 2.  Unchanged L5-S1 degenerative disease with mild left foraminal stenosis secondary endplate spurring and facet hypertrophy. 3.  Unchanged L3-L4 facet arthrosis with mild central stenosis.  Original Report Authenticated By: Andreas Newport, M.D.    MEDICATIONS: Scheduled Meds:    . aspirin EC  81 mg Oral Daily  . bethanechol  25 mg Oral BID  . ceFAZolin (ANCEF) IV  1 g Intravenous 60 min Pre-Op  . citalopram  10 mg Oral Daily  . dexamethasone  8 mg Intravenous QID  . dicyclomine  10 mg Oral TID AC & HS  . enoxaparin  40 mg Subcutaneous Q24H  . loratadine  10 mg Oral Daily  . LORazepam  1 mg Intravenous Once  . metoprolol succinate  12.5 mg Oral Daily  . pantoprazole  40 mg Oral Daily  . polyethylene glycol  17 g Oral BID  . simvastatin  20 mg Oral q1800  . Tamsulosin HCl  0.4 mg Oral BID  . DISCONTD: dexamethasone  8 mg Intravenous Q6H   Continuous Infusions:  PRN Meds:.acetaminophen, acetaminophen, ondansetron (ZOFRAN) IV, ondansetron, oxyCODONE,  zolpidem  Antibiotics: Anti-infectives     Start     Dose/Rate Route Frequency Ordered Stop   01/11/11 0000   ceFAZolin (ANCEF) IVPB 1 g/50 mL premix        1 g 100 mL/hr over 30 Minutes Intravenous 60 min pre-op 01/10/11 1230            Assessment/Plan: Patient Active Hospital Problem List: Cord compression    Assessment: this is in the cervical region, and is secondary to DISH. Unchanged neurological deficits.    Plan: Going to the OR for decompression today. Continue with Decadron. Rest per neurosurgery.  HTN (hypertension) Assessment: Relatively well Controlled  Plan: Continue with metoprolol.   Dyslipidemia  Assessment: Stable  Plan: Continue with Zocor   BPH (benign prostatic hyperplasia)  Assessment: An ongoing issue, but no difficulty urinating.  Plan: Continue with Flomax. Has Foley in place now.  GERD (gastroesophageal reflux disease)  Assessment: Stable  Plan: Continue with Protonix.   Lumbar stenosis with neurogenic claudication    Assessment: Stable, this is not the cause of  patient's symptoms. MRI of the lumbar spine did not show significant pathology in this area.    Plan: monitor.   Irritable bowel syndrome (IBS)    Assessment: Constipated.    Plan: Resume MiraLax. Continue with Bentyl  Depression   Assessment: Stable   Plan: Continue with Celexa  Disposition:  remain inpatient.   DVT Prophylaxis: PROPHYLACTIC LOVENOX   Code Status:  full code   Maretta Bees, MD. 01/11/2011, 2:35 PM

## 2011-01-12 ENCOUNTER — Other Ambulatory Visit: Payer: Self-pay

## 2011-01-12 ENCOUNTER — Encounter (HOSPITAL_COMMUNITY)
Admission: EM | Disposition: A | Discharge status: Rehabilitation Facility | Payer: Self-pay | Source: Ambulatory Visit | Attending: Internal Medicine

## 2011-01-12 LAB — BASIC METABOLIC PANEL
BUN: 25 mg/dL — ABNORMAL HIGH (ref 6–23)
Chloride: 102 mEq/L (ref 96–112)
GFR calc Af Amer: >90 mL/min (ref >90)
GFR calc non Af Amer: 80 mL/min — ABNORMAL LOW (ref >90)
Glucose, Bld: 159 mg/dL — ABNORMAL HIGH (ref 70–99)
Potassium: 4.1 mEq/L (ref 3.5–5.1)
Sodium: 137 mEq/L (ref 135–145)

## 2011-01-12 LAB — CBC
HCT: 42.8 % (ref 39.0–52.0)
Hemoglobin: 14.6 g/dL (ref 13.0–17.0)
MCHC: 34.1 g/dL (ref 30.0–36.0)
WBC: 11.9 10*3/uL — ABNORMAL HIGH (ref 4.0–10.5)

## 2011-01-12 SURGERY — ANTERIOR CERVICAL DECOMPRESSION/DISCECTOMY FUSION 1 LEVEL
Anesthesia: General

## 2011-01-12 MED ORDER — DIAZEPAM 5 MG PO TABS
5.0000 mg | ORAL_TABLET | Freq: Four times a day (QID) | ORAL | Status: DC | PRN
  Administered 2011-01-12 – 2011-01-23 (×12): 5 mg via ORAL
  Filled 2011-01-12 (×12): qty 1

## 2011-01-12 MED ORDER — SIMETHICONE 80 MG PO CHEW
80.0000 mg | CHEWABLE_TABLET | Freq: Four times a day (QID) | ORAL | Status: DC
  Administered 2011-01-12 – 2011-01-23 (×44): 80 mg via ORAL
  Filled 2011-01-12 (×50): qty 1

## 2011-01-12 NOTE — Progress Notes (Signed)
PATIENT DETAILS Name: Ricky Bradley Age: 77 y.o. Sex: male Date of Birth: 20-Oct-1933 Admit Date: 01/08/2011 [id number], Ricky Robert, RN, Nursing/RN Coord  Brief Summary: Ricky Bradley is a very pleasant 77 year old gentleman who was admitted on 25th of November 2012 for weakness and inability to ambulate. He does have a history of having a neurogenic claudication requiring a lumbar laminectomy earlier this year. In the emergency room he was evaluated with an MRI of his lumbar spine which did not show significant pathology. The next morning he was found to be very hyperreflexic. A  MRI of his brain, thoracic and cervical spine were ordered. A cervical spine MRI showed cord compression at C6-C7 with cord edema. Neurosurgery was subsequently consulted and patient was emergently placed on Decadron. Patient is planned to go to the OR for decompression later today.  Subjective: Anxious, as scheduled for surgery today..  Objective: Vital signs in last 24 hours: Filed Vitals:   01/10/11 2200 01/11/11 0538 01/11/11 1400 01/11/11 2200  BP: 156/74 139/83 135/67 137/68  Pulse: 73 81 69 64  Temp: 98.7 F (37.1 C) 98.1 F (36.7 C) 98.7 F (37.1 C) 97.7 F (36.5 C)  TempSrc: Oral Oral Oral Oral  Resp: 18 20 18 24   Height:      Weight:      SpO2: 96% 92% 98% 97%    Weight change:   Body mass index is 24.36 kg/(m^2).  Intake/Output from previous day:  Intake/Output Summary (Last 24 hours) at 01/12/11 1253 Last data filed at 01/12/11 0600  Gross per 24 hour  Intake    360 ml  Output   2050 ml  Net  -1690 ml    PHYSICAL EXAM: Gen Exam: Awake and alert with clear speech. Not in any distress. Neck: Supple, No JVD.   Chest: B/L Clear.  No added sounds. CVS: S1 S2 Regular, no murmurs.  Abdomen: soft, BS +, non tender, non distended.  Extremities: no edema, lower extremities warm to touch. Neurologic: Unchanged neurological deficits  Skin: No Rash.   Wounds: N/A.    CONSULTS:  Neurosurgery  and neurology  LAB RESULTS: CBC  Lab 01/12/11 0523 01/09/11 0718 01/08/11 1847 01/08/11 1146  WBC 11.9* 5.9 7.7 6.9  HGB 14.6 13.9 14.8 15.4  HCT 42.8 40.9 41.9 43.0  PLT 191 153 156 182  MCV 83.1 84.3 83.1 83.0  MCH 28.3 28.7 29.4 29.7  MCHC 34.1 34.0 35.3 35.8  RDW 13.6 14.2 13.9 13.7  LYMPHSABS -- -- -- 1.5  MONOABS -- -- -- 0.7  EOSABS -- -- -- 0.1  BASOSABS -- -- -- 0.0  BANDABS -- -- -- --    Chemistries   Lab 01/12/11 0523 01/09/11 0718 01/08/11 1847 01/08/11 1146  NA 137 143 -- 141  K 4.1 3.6 -- 3.0*  CL 102 110 -- 103  CO2 25 26 -- 26  GLUCOSE 159* 97 -- 111*  BUN 25* 14 -- 12  CREATININE 0.94 0.95 0.88 1.02  CALCIUM 9.3 8.9 -- 9.8  MG -- -- -- --    GFR Estimated Creatinine Clearance: 74.5 ml/min (by C-G formula based on Cr of 0.94).  Coagulation profile No results found for this basename: INR:5,PROTIME:5 in the last 168 hours  Cardiac Enzymes No results found for this basename: CK:3,CKMB:3,TROPONINI:3,MYOGLOBIN:3 in the last 168 hours  No results found for this basename: POCBNP:3 in the last 168 hours No results found for this basename: DDIMER:2 in the last 72 hours No results found for this  basename: HGBA1C:2 in the last 72 hours No results found for this basename: CHOL:2,HDL:2,LDLCALC:2,TRIG:2,CHOLHDL:2,LDLDIRECT:2 in the last 72 hours No results found for this basename: TSH,T4TOTAL,FREET3,T3FREE,THYROIDAB in the last 72 hours No results found for this basename: VITAMINB12:2,FOLATE:2,FERRITIN:2,TIBC:2,IRON:2,RETICCTPCT:2 in the last 72 hours No results found for this basename: LIPASE:2,AMYLASE:2 in the last 72 hours  Urine Studies No results found for this basename: UACOL:2,UAPR:2,USPG:2,UPH:2,UTP:2,UGL:2,UKET:2,UBIL:2,UHGB:2,UNIT:2,UROB:2,ULEU:2,UEPI:2,UWBC:2,URBC:2,UBAC:2,CAST:2,CRYS:2,UCOM:2,BILUA:2 in the last 72 hours  MICROBIOLOGY: No results found for this or any previous visit (from the past 240 hour(s)).  RADIOLOGY STUDIES/RESULTS: Dg  Chest 2 View  01/08/2011  *RADIOLOGY REPORT*  Clinical Data: Numbness in both legs since yesterday.  Weakness.  CHEST - 2 VIEW  Comparison: CT 11/02/2010.  Plain films 08/18/2010.  Findings: Lateral view degraded by patient arm position.  Mild thoracic spondylosis.  Apical lordotic frontal view. Midline trachea.  Normal heart size and mediastinal contours. No pleural effusion or pneumothorax.  Clear lungs.  IMPRESSION: Normal chest.  Original Report Authenticated By: Consuello Bossier, M.D.   Ct Head Wo Contrast  01/08/2011  *RADIOLOGY REPORT*  Clinical Data: Bilateral leg weakness  CT HEAD WITHOUT CONTRAST  Technique:  Contiguous axial images were obtained from the base of the skull through the vertex without contrast.  Comparison: 01/27/2007  Findings: Mild atrophy. There is no evidence of acute intracranial hemorrhage, brain edema, mass lesion, acute infarction,   mass effect, or midline shift. Acute infarct may be inapparent on noncontrast CT.  No other intra-axial abnormalities are seen, and the ventricles and sulci are within normal limits in size and symmetry.   No abnormal extra-axial fluid collections or masses are identified.  No significant calvarial abnormality.  IMPRESSION: 1. Negative for bleed or other acute intracranial process.  Original Report Authenticated By: Osa Craver, M.D.   Mr Ricky Bradley ZO Contrast  01/09/2011  *RADIOLOGY REPORT*  Clinical Data: Lower extremity weakness.  MRI HEAD WITHOUT AND WITH CONTRAST  Technique:  Multiplanar, multiecho pulse sequences of the brain and surrounding structures were obtained according to standard protocol without and with intravenous contrast  Contrast: 1 MULTIHANCE GADOBENATE DIMEGLUMINE 529 MG/ML IV SOLN  Comparison: CT 01/08/2011  Findings: Age appropriate atrophy.  Slight chronic microvascular ischemic change in the white matter.  Negative for acute infarct. Negative for hemorrhage or mass.  Postcontrast imaging the brain reveals normal  enhancement.  Mild mucosal edema in the paranasal sinuses.  Joint effusion involving the right C1-C2 joint.  IMPRESSION: No acute intracranial abnormality.  Original Report Authenticated By: Camelia Phenes, M.D.   Mr Cervical Spine W Wo Contrast  01/09/2011  **ADDENDUM** CREATED: 01/09/2011 13:34:23  Critical Value/emergent results were called by telephone at the time of interpretation on 01/09/2011  at 1136 hours  to  Dr. Jerral Ralph, who verbally acknowledged these results.  **END ADDENDUM** SIGNED BY: Andreas Newport, M.D.   01/09/2011  *RADIOLOGY REPORT*  Clinical Data:  Leg weakness.  Lower extremity weakness and inability to ambulate.  Spinal stenosis.  MRI CERVICAL SPINE WITHOUT AND WITH CONTRAST  Technique:  Multiplanar and multiecho pulse sequences of the cervic al spine, to include the craniocervical junction and cervicothoraci c junction, were obtained according to standard protocol without an d with intravenous contrast.  Contrast: 15 ml Multihance  Comparison: None.  Findings:  Exaggeration of the normal cervical lordosis.  There appears to be ankylosis extending from C4-C6, with confluent anterior osteophytes. The appearance is compatible with diffuse idiopathic skeletal hyperostosis. Cervical cord edema is present associated with cervical cord compression  at C6-C7.  There is probable ankylosis across C2-C3 as well.  Small right greater than left atlanto-occipital effusions are present.  Craniocervical junction appears normal. Nonspecific cystic appearing lesion is present in the right thyroid lobe measuring 15 mm.  C2-C3:  Central canal patent.  Left foraminal encroachment associated with facet arthrosis.  C3-C4:  Mild central stenosis associated with broad-based disc osteophyte complex.  Mild right posterior ligamentum flavum redundancy.  Disc osteophyte complex contacts the ventral aspect of the cervical cord and may flattened it slightly.  Right greater than left foraminal stenosis associated  with uncovertebral spurring and facet arthrosis.  C4-C5:  Apparent ankylosis across the disc space.  Central canal and foramina appear patent.  C5-C6:  Facet hypertrophy.  Apparent ankylosis.  Mild central stenosis associated with osseous ridging.  No definite cord deformity.  C6-C7:  Severe central stenosis with compression of the cervical cord. Mild edema is present above and below the levels of compression.  Broad-based disc osteophyte complex and posterior ligamentum flavum redundancy produce the severe central stenosis. Bilateral foraminal stenosis secondary uncovertebral spurring. Bilateral facet effusions.  C7-T1:  Severe right facet arthrosis with right foraminal stenosis potentially affecting the right C8 nerve.  The foramen appears patent. Minimal central stenosis associated with posterior element hypertrophy and ligamentum flavum redundancy.  IMPRESSION: 1.  Cervical cord compression at C6-C7 with cord edema and / or myelomalacia.  Severe spinal stenosis due to broad-based disc osteophyte complex and posterior ligamentum flavum redundancy. 2.  Diffuse idiopathic skeletal hyperostosis with ankylosis from C4- C6, probably extending to C2 and C3 as well. 3.  Mild C3-C4 central stenosis secondary to disc osteophyte complex.  Right greater than left foraminal stenosis associated uncovertebral spurring.  MRI THORACIC SPINE WITHOUT AND WITH CONTRAST  Technique:  Multiplanar and multiecho pulse sequences of the thoracic spine were obtained without and with intravenous contrast.  Contrast: 15 MULTIHANCE GADOBENATE DIMEGLUMINE 529 MG/ML IV SOLN  Findings:  Mild dextroconvex curvature of the thoracic spine is present.  Remote compression fractures T7 and T8, with approximately 25% loss of vertebral body height.  No marrow edema. The thoracic cord demonstrates normal caliber and signal. Paraspinal soft tissues are within normal limits.  Scattered vertebral body hemangiomata are present.  High signal is present within  the discs at T6-T7 and T7-T8 with post-gadolinium enhancement.  There is no endplate edema, erosion or enhancement and this is compatible with degenerative enhancement of the discs.  This may also be due to ossification of the discs with development of marrow space.  Thoracic spine DISH is present.  Tiny central disc protrusion is present at T7-T8, just contacting the ventral aspect of the thoracic cord.  Per CMS PQRS reporting requirements (PQRS Measure 24): Given the patient's age of greater than 50 and the fracture site (hip, distal radius, or spine), the patient should be tested for osteoporosis using DXA, and the appropriate treatment considered based on the DXA results.  IMPRESSION: No thoracic cord compression.  Thoracic spine DISH and chronic compression fractures of T7 and T8 (25% loss of height) without retropulsion. Original Report Authenticated By: Andreas Newport, M.D.   Mr Thoracic Spine W Wo Contrast  01/09/2011  **ADDENDUM** CREATED: 01/09/2011 13:34:23  Critical Value/emergent results were called by telephone at the time of interpretation on 01/09/2011  at 1136 hours  to  Dr. Jerral Ralph, who verbally acknowledged these results.  **END ADDENDUM** SIGNED BY: Andreas Newport, M.D.   01/09/2011  *RADIOLOGY REPORT*  Clinical Data:  Leg weakness.  Lower extremity weakness and inability to ambulate.  Spinal stenosis.  MRI CERVICAL SPINE WITHOUT AND WITH CONTRAST  Technique:  Multiplanar and multiecho pulse sequences of the cervic al spine, to include the craniocervical junction and cervicothoraci c junction, were obtained according to standard protocol without an d with intravenous contrast.  Contrast: 15 ml Multihance  Comparison: None.  Findings:  Exaggeration of the normal cervical lordosis.  There appears to be ankylosis extending from C4-C6, with confluent anterior osteophytes. The appearance is compatible with diffuse idiopathic skeletal hyperostosis. Cervical cord edema is present associated with  cervical cord compression at C6-C7.  There is probable ankylosis across C2-C3 as well.  Small right greater than left atlanto-occipital effusions are present.  Craniocervical junction appears normal. Nonspecific cystic appearing lesion is present in the right thyroid lobe measuring 15 mm.  C2-C3:  Central canal patent.  Left foraminal encroachment associated with facet arthrosis.  C3-C4:  Mild central stenosis associated with broad-based disc osteophyte complex.  Mild right posterior ligamentum flavum redundancy.  Disc osteophyte complex contacts the ventral aspect of the cervical cord and may flattened it slightly.  Right greater than left foraminal stenosis associated with uncovertebral spurring and facet arthrosis.  C4-C5:  Apparent ankylosis across the disc space.  Central canal and foramina appear patent.  C5-C6:  Facet hypertrophy.  Apparent ankylosis.  Mild central stenosis associated with osseous ridging.  No definite cord deformity.  C6-C7:  Severe central stenosis with compression of the cervical cord. Mild edema is present above and below the levels of compression.  Broad-based disc osteophyte complex and posterior ligamentum flavum redundancy produce the severe central stenosis. Bilateral foraminal stenosis secondary uncovertebral spurring. Bilateral facet effusions.  C7-T1:  Severe right facet arthrosis with right foraminal stenosis potentially affecting the right C8 nerve.  The foramen appears patent. Minimal central stenosis associated with posterior element hypertrophy and ligamentum flavum redundancy.  IMPRESSION: 1.  Cervical cord compression at C6-C7 with cord edema and / or myelomalacia.  Severe spinal stenosis due to broad-based disc osteophyte complex and posterior ligamentum flavum redundancy. 2.  Diffuse idiopathic skeletal hyperostosis with ankylosis from C4- C6, probably extending to C2 and C3 as well. 3.  Mild C3-C4 central stenosis secondary to disc osteophyte complex.  Right greater than  left foraminal stenosis associated uncovertebral spurring.  MRI THORACIC SPINE WITHOUT AND WITH CONTRAST  Technique:  Multiplanar and multiecho pulse sequences of the thoracic spine were obtained without and with intravenous contrast.  Contrast: 15 MULTIHANCE GADOBENATE DIMEGLUMINE 529 MG/ML IV SOLN  Findings:  Mild dextroconvex curvature of the thoracic spine is present.  Remote compression fractures T7 and T8, with approximately 25% loss of vertebral body height.  No marrow edema. The thoracic cord demonstrates normal caliber and signal. Paraspinal soft tissues are within normal limits.  Scattered vertebral body hemangiomata are present.  High signal is present within the discs at T6-T7 and T7-T8 with post-gadolinium enhancement.  There is no endplate edema, erosion or enhancement and this is compatible with degenerative enhancement of the discs.  This may also be due to ossification of the discs with development of marrow space.  Thoracic spine DISH is present.  Tiny central disc protrusion is present at T7-T8, just contacting the ventral aspect of the thoracic cord.  Per CMS PQRS reporting requirements (PQRS Measure 24): Given the patient's age of greater than 50 and the fracture site (hip, distal radius, or spine), the patient should be tested for osteoporosis using DXA, and the appropriate treatment considered  based on the DXA results.  IMPRESSION: No thoracic cord compression.  Thoracic spine DISH and chronic compression fractures of T7 and T8 (25% loss of height) without retropulsion. Original Report Authenticated By: Andreas Newport, M.D.   Mr Lumbar Spine W Wo Contrast  01/08/2011  *RADIOLOGY REPORT*  Clinical Data: Bilateral lower extremity weakness.  History of prior lumbar spine surgery.  Progressive worsening weakness in the lower extremities.  MRI LUMBAR SPINE WITHOUT AND WITH CONTRAST  Technique:  Multiplanar and multiecho pulse sequences of the lumbar spine were obtained without and with  intravenous contrast.  Contrast:  17 ml Multihance.  Comparison: MRI lumbar spine 09/14/2010.  Findings: Numbering convention used on prior exam preserved. Vertebral body height and marrow signal is within normal limits. Paraspinal soft tissues are within normal limits.  Distention of the urinary bladder is present.  No destructive osseous lesions. Postoperative changes of L4 laminectomy.  The small fluid collection in the operative bed seen on the recent comparison has resolved.  T12-L1:  Negative.  L1-L2:  Shallow posterior disc bulging without stenosis.  Mild facet hypertrophy and ligamentum flavum redundancy.  L2-L3:  Mild facet hypertrophy.  No stenosis.  L3-L4:  Bilateral facet hypertrophy and ligamentum flavum redundancy with mild central stenosis.  Unchanged mild symmetric bilateral foraminal stenosis.  L4-L5:  Laminectomy with wide posterior decompression.  Central canal and lateral recesses are patent.  The enhancing granulation tissue is present in the operative bed and the epidural space.  No epidural abscess or fluid collection.  Short pedicles are present with mild left foraminal stenosis.  Facet hypertrophy is present.  L5-S1:  Disc desiccation and degeneration.  Degenerative endplate changes are present.  Central canal and lateral recesses are patent.  Mild left foraminal stenosis associated with endplate spurring and facet hypertrophy.  The left greater than right facet degeneration/hypertrophy.  IMPRESSION:  1.  Evolving postoperative changes of L4 laminectomy with good decompression of the thecal sac and lateral recesses.  Mild left foraminal stenosis associated with short pedicles and facet hypertrophy potentially affecting the left L4 nerve. Resolution of small fluid collection in the operative bed.  Expected enhancing granulation tissue in the operative bed. 2.  Unchanged L5-S1 degenerative disease with mild left foraminal stenosis secondary endplate spurring and facet hypertrophy. 3.   Unchanged L3-L4 facet arthrosis with mild central stenosis.  Original Report Authenticated By: Andreas Newport, M.D.    MEDICATIONS: Scheduled Meds:    . aspirin EC  81 mg Oral Daily  . ceFAZolin (ANCEF) IV  1 g Intravenous 60 min Pre-Op  . citalopram  10 mg Oral Daily  . dexamethasone  8 mg Intravenous QID  . dicyclomine  10 mg Oral TID AC & HS  . enoxaparin  40 mg Subcutaneous Q24H  . loratadine  10 mg Oral Daily  . LORazepam  1 mg Intravenous Once  . metoprolol succinate  12.5 mg Oral Daily  . pantoprazole  40 mg Oral Daily  . polyethylene glycol  17 g Oral BID  . simvastatin  20 mg Oral q1800  . Tamsulosin HCl  0.4 mg Oral BID  . DISCONTD: bethanechol  25 mg Oral BID   Continuous Infusions:  PRN Meds:.acetaminophen, acetaminophen, ondansetron (ZOFRAN) IV, ondansetron, oxyCODONE, zolpidem  Antibiotics: Anti-infectives     Start     Dose/Rate Route Frequency Ordered Stop   01/11/11 0000   ceFAZolin (ANCEF) IVPB 1 g/50 mL premix        1 g 100 mL/hr over 30 Minutes Intravenous  60 min pre-op 01/10/11 1230            Assessment/Plan: Patient Active Hospital Problem List: Cord compression    Assessment: this is in the cervical region, and is secondary to DISH. Unchanged neurological deficits.    Plan: Going to the OR for decompression today. Continue with Decadron. Rest per neurosurgery.  HTN (hypertension) Assessment: Relatively well Controlled  Plan: Continue with metoprolol.   Dyslipidemia  Assessment: Stable  Plan: Continue with Zocor   BPH (benign prostatic hyperplasia)  Assessment: An ongoing issue, but no difficulty urinating.  Plan: Continue with Flomax. Has Foley in place now.Will need voiding trail when foley discontinued.  GERD (gastroesophageal reflux disease)  Assessment: Stable  Plan: Continue with Protonix.   Lumbar stenosis with neurogenic claudication    Assessment: Stable, this is not the cause of patient's symptoms. MRI of the lumbar spine  did not show significant pathology in this area.    Plan: monitor.   Irritable bowel syndrome (IBS)    Assessment: Constipated.    Plan: Resume MiraLax. Continue with Bentyl  Depression   Assessment: Stable   Plan: Continue with Celexa  Disposition:  remain inpatient.   DVT Prophylaxis: PROPHYLACTIC LOVENOX   Code Status:  full code   Maretta Bees, MD. 01/12/2011, 12:53 PM

## 2011-01-12 NOTE — Progress Notes (Signed)
Patient ID: Ricky Bradley, male   DOB: 07-17-1933, 77 y.o.   MRN: [id number] Subjective:  The patient is alert and pleasant. He is disappointed that he can have the surgery today. I've spoken to the patient and his daughter and answered all her questions.  Objective: Vital signs in last 24 hours: Temp:  [97.7 F (36.5 C)-98.7 F (37.1 C)] 97.7 F (36.5 C) (11/28 2200) Pulse Rate:  [64-69] 64  (11/28 2200) Resp:  [18-24] 24  (11/28 2200) BP: (135-137)/(67-68) 137/68 mmHg (11/28 2200) SpO2:  [97 %-98 %] 97 % (11/28 2200)  Intake/Output from previous day: 11/28 0701 - 11/29 0700 In: 600 [P.O.:600] Out: 3700 [Urine:3700] Intake/Output this shift:    Physical exam the patient is alert and oriented. He continues to move all 4 extremities well without change.  Lab Results:  [facility] 01/12/11 0523  WBC 11.9*  HGB 14.6  HCT 42.8  PLT 191   BMET  Basename 01/12/11 0523  NA 137  K 4.1  CL 102  CO2 25  GLUCOSE 159*  BUN 25*  CREATININE 0.94  CALCIUM 9.3    Studies/Results: Dg Chest 2 View  01/11/2011  *RADIOLOGY REPORT*  Clinical Data: Preoperative chest radiograph for cervical spine surgery.  CHEST - 2 VIEW  Comparison: Chest radiograph performed 01/08/2011  Findings: The lungs are well-aerated and clear.  There is no evidence of focal opacification, pleural effusion or pneumothorax. The right costophrenic angle is incompletely imaged on the frontal view.  The heart is normal in size; the mediastinal contour is within normal limits.  No acute osseous abnormalities are seen.  Mild anterior bridging osteophytes are noted along the lower thoracic spine.  IMPRESSION: No acute cardiopulmonary process seen.  Original Report Authenticated By: Tonia Ghent, M.D.    Assessment/Plan: C6-7 spondylosis, stenosis, myelopathy, cervicalgia: The patient is neurologically stable. I plan to do his surgery today but I did not realize he was on Lovenox. The current plan is to reschedule surgery  for Monday. I have answered all the patient and his daughter's questions.  LOS: 4 days     Ricky Bradley D 01/12/2011, 1:22 PM

## 2011-01-13 MED ORDER — GI COCKTAIL ~~LOC~~
30.0000 mL | Freq: Two times a day (BID) | ORAL | Status: DC | PRN
  Administered 2011-01-13 – 2011-01-14 (×2): 30 mL via ORAL
  Filled 2011-01-13 (×2): qty 30

## 2011-01-13 MED ORDER — NON FORMULARY: Status: DC

## 2011-01-13 MED ORDER — LORATADINE 10 MG PO TABS
10.0000 mg | ORAL_TABLET | Freq: Every day | ORAL | Status: DC
  Administered 2011-01-13: 10 mg via ORAL
  Filled 2011-01-13: qty 1

## 2011-01-13 MED ORDER — METOPROLOL SUCCINATE 12.5 MG HALF TABLET
12.5000 mg | ORAL_TABLET | Freq: Two times a day (BID) | ORAL | Status: DC
  Administered 2011-01-13 – 2011-01-27 (×27): 12.5 mg via ORAL
  Filled 2011-01-13 (×31): qty 1

## 2011-01-13 NOTE — Progress Notes (Signed)
PATIENT DETAILS Name: Ricky Bradley Age: 77 y.o. Sex: male Date of Birth: 1933-05-26 Admit Date: 01/08/2011 [id number], Ricky Robert, RN, Nursing/RN Coord  CONSULTS: 1.  Dr. Tressie Stalker, neurosurgery 2.  Dr. Carmell Austria, neurology  Interval History: Ricky Bradley is a 77 year old male who presented to the hospital on 01/08/2011 with bilateral lower extremity weakness and inability to ambulate. He did have a history of having neurogenic claudication requiring a lumbar laminectomy earlier this year. In the emergency room he was evaluated with an MRI of his lumbar spine which did not show significant pathology. The next morning he was found to be very hyperreflexic. A MRI of his brain, thoracic and cervical spine were ordered. A cervical spine MRI showed cord compression at C6-C7 with cord edema. Neurosurgery was subsequently consulted and patient was emergently placed on Decadron. Patient is planned to go to the OR for decompression on Monday, 01/16/2011.  ROS: Ricky Bradley complains of abdominal pain. It is dull in quality. It is similar to the pain he has with irritable bowel flares. He states that he takes Phazyme and simethicone as needed at home for this pain. He denies any other pain. He denies dyspnea. His appetite is poor today.   Objective: Vital signs in last 24 hours: Temp:  [97.9 F (36.6 C)] 97.9 F (36.6 C) (11/30 0645) Pulse Rate:  [66-71] 71  (11/30 0645) Resp:  [20] 20  (11/30 0645) BP: (118-142)/(71-82) 118/71 mmHg (11/30 0645) SpO2:  [93 %-95 %] 93 % (11/30 0645) Weight change:  Last BM Date: 01/08/11  Intake/Output from previous day:  Intake/Output Summary (Last 24 hours) at 01/13/11 1446 Last data filed at 01/13/11 0700  Gross per 24 hour  Intake      0 ml  Output   1325 ml  Net  -1325 ml     Physical Exam:  Gen:  No acute distress. Cardiovascular:  Heart sounds regular. No murmurs, rubs, or gallops. Respiratory: Lungs clear to auscultation  bilaterally. Gastrointestinal: Abdomen soft, mildly tender. No masses. Bowel sounds present x4. Extremities: No clubbing, edema, or cyanosis   Lab Results: Basic Metabolic Panel:  Lab 01/12/11 9811 01/09/11 0718 01/08/11 1847 01/08/11 1146  NA 137 143 -- 141  K 4.1 3.6 -- --  CL 102 110 -- 103  CO2 25 26 -- 26  GLUCOSE 159* 97 -- 111*  BUN 25* 14 -- 12  CREATININE 0.94 0.95 0.88 1.02  CALCIUM 9.3 8.9 -- 9.8  MG -- -- -- --  PHOS -- -- -- --   GFR Estimated Creatinine Clearance: 74.5 ml/min (by C-G formula based on Cr of 0.94). Liver Function Tests:  Lab 01/08/11 1146  AST 14  ALT 13  ALKPHOS 105  BILITOT 0.6  PROT 7.0  ALBUMIN 3.8   CBC:  Lab 01/12/11 0523 01/09/11 0718 01/08/11 1847 01/08/11 1146  WBC 11.9* 5.9 7.7 6.9  NEUTROABS -- -- -- 4.6  HGB 14.6 13.9 14.8 15.4  HCT 42.8 40.9 41.9 43.0  MCV 83.1 84.3 83.1 83.0  PLT 191 153 156 182   CBG:  Lab 01-31-2011 1649  GLUCAP 158*    Studies/Results: Dg Chest 2 View 01/31/11   IMPRESSION: No acute cardiopulmonary process seen.  Original Report Authenticated By: Tonia Ghent, M.D.    Medications: Scheduled Meds:   . aspirin EC  81 mg Oral Daily  . ceFAZolin (ANCEF) IV  1 g Intravenous 60 min Pre-Op  . citalopram  10 mg Oral Daily  . dexamethasone  8 mg Intravenous QID  . dicyclomine  10 mg Oral TID AC & HS  . enoxaparin  40 mg Subcutaneous Q24H  . loratadine  10 mg Oral Daily  . LORazepam  1 mg Intravenous Once  . metoprolol succinate  12.5 mg Oral Daily  . pantoprazole  40 mg Oral Daily  . polyethylene glycol  17 g Oral BID  . simethicone  80 mg Oral QID  . simvastatin  20 mg Oral q1800  . Tamsulosin HCl  0.4 mg Oral BID  . DISCONTD: loratadine  10 mg Oral Daily  . DISCONTD: NON FORMULARY   Mouth/Throat 1 day or 1 dose   Continuous Infusions:  PRN Meds:.acetaminophen, acetaminophen, diazepam, ondansetron (ZOFRAN) IV, ondansetron, oxyCODONE, zolpidem Antibiotics: Anti-infectives     Start      Dose/Rate Route Frequency Ordered Stop   01/11/11 0000   ceFAZolin (ANCEF) IVPB 1 g/50 mL premix        1 g 100 mL/hr over 30 Minutes Intravenous 60 min pre-op 01/10/11 1230             Assessment/Plan:  Principal Problem:  *Cord compression Assessment: Severe cervical cord compression at C6-C7 with cord edema and/or myelomalacia. Severe spinal stenosis due to broad-based disc osteophyte complex and posterior ligamentum flavum redundancy. Plan: C6-7 anterior cervical discectomy fusion and plating planned for 01/16/2011 by neurosurgery. Active Problems:  HTN (hypertension) Assessment: Controlled. Plan: Continue metoprolol.  Dyslipidemia Assessment: Stable  Plan: Continue with Zocor   BPH (benign prostatic hyperplasia) Assessment: An ongoing issue, but no difficulty urinating.  Plan: Continue with Flomax. Has Foley in place now.Will need voiding trail when foley discontinued.  GERD (gastroesophageal reflux disease) Assessment: Stable  Plan: Continue with Protonix.   Lumbar stenosis with neurogenic claudication Assessment: Stable, this is not the cause of patient's symptoms. MRI of the lumbar spine did not show significant pathology in this area.  Plan: monitor.  Irritable bowel syndrome (IBS) Assessment: Complains of abdominal pain today.  Plan: Continue simethicone, MiraLAX, Bentyl, and add a GI cocktail.   LOS: 5 days   Hillery Aldo, MD Pager [phone]  01/13/2011, 2:46 PM

## 2011-01-13 NOTE — Progress Notes (Signed)
Patient ID: Ricky Bradley, male   DOB: 1933/08/21, 77 y.o.   MRN: [id number] Subjective:  The patient is alert and pleasant. He has no complaints.  Objective: Vital signs in last 24 hours: Pulse Rate:  [66] 66  (11/29 2245) Resp:  [20] 20  (11/29 2245) BP: (142)/(82) 142/82 mmHg (11/29 2245) SpO2:  [95 %] 95 % (11/29 2245)  Intake/Output from previous day: 11/29 0701 - 11/30 0700 In: -  Out: 2625 [Urine:2625] Intake/Output this shift:    Physical exam the patient continues to move all 4 extremities well.  Lab Results:  [facility] 01/12/11 0523  WBC 11.9*  HGB 14.6  HCT 42.8  PLT 191   BMET  Basename 01/12/11 0523  NA 137  K 4.1  CL 102  CO2 25  GLUCOSE 159*  BUN 25*  CREATININE 0.94  CALCIUM 9.3    Studies/Results: Dg Chest 2 View  01/11/2011  *RADIOLOGY REPORT*  Clinical Data: Preoperative chest radiograph for cervical spine surgery.  CHEST - 2 VIEW  Comparison: Chest radiograph performed 01/08/2011  Findings: The lungs are well-aerated and clear.  There is no evidence of focal opacification, pleural effusion or pneumothorax. The right costophrenic angle is incompletely imaged on the frontal view.  The heart is normal in size; the mediastinal contour is within normal limits.  No acute osseous abnormalities are seen.  Mild anterior bridging osteophytes are noted along the lower thoracic spine.  IMPRESSION: No acute cardiopulmonary process seen.  Original Report Authenticated By: Tonia Ghent, M.D.    Assessment/Plan: C6-7 spondylosis stenosis cervical myelopathy: We will plan to perform a C6-7 anterior cervical discectomy fusion and plating on Monday. I have answered all the patient's questions.  LOS: 5 days     Sonali Wivell D 01/13/2011, 7:45 AM

## 2011-01-13 NOTE — Progress Notes (Signed)
Utilization Review Completed.Ricky Bradley T11/30/2012   

## 2011-01-14 LAB — BASIC METABOLIC PANEL
BUN: 29 mg/dL — ABNORMAL HIGH (ref 6–23)
Calcium: 9 mg/dL (ref 8.4–10.5)
Chloride: 99 mEq/L (ref 96–112)
Creatinine, Ser: 1.03 mg/dL (ref 0.50–1.35)
GFR calc Af Amer: 80 mL/min — ABNORMAL LOW (ref >90)
GFR calc non Af Amer: 69 mL/min — ABNORMAL LOW (ref >90)

## 2011-01-14 LAB — CBC
MCHC: 35.7 g/dL (ref 30.0–36.0)
Platelets: 176 10*3/uL (ref 150–400)
RDW: 13.4 % (ref 11.5–15.5)
WBC: 9.2 10*3/uL (ref 4.0–10.5)

## 2011-01-14 MED ORDER — FLEET ENEMA 7-19 GM/118ML RE ENEM
1.0000 | ENEMA | Freq: Once | RECTAL | Status: AC
  Administered 2011-01-14: 1 via RECTAL
  Filled 2011-01-14: qty 1

## 2011-01-14 MED ORDER — LORATADINE 10 MG PO TABS
10.0000 mg | ORAL_TABLET | Freq: Once | ORAL | Status: AC
  Administered 2011-01-14: 10 mg via ORAL
  Filled 2011-01-14: qty 1

## 2011-01-14 NOTE — Progress Notes (Signed)
PATIENT DETAILS Name: Ricky Bradley Age: 77 y.o. Sex: male Date of Birth: Mar 21, 1933 Admit Date: 01/08/2011 [id number], Smith Robert, RN, Nursing/RN Coord  CONSULTS: 1.  Dr. Tressie Stalker, neurosurgery 2.  Dr. Carmell Austria, neurology  Interval History: Ricky Bradley is a 77 year old male who presented to the hospital on 01/08/2011 with bilateral lower extremity weakness and inability to ambulate. He did have a history of having neurogenic claudication requiring a lumbar laminectomy earlier this year. In the emergency room he was evaluated with an MRI of his lumbar spine which did not show significant pathology. The next morning he was found to be very hyperreflexic. A MRI of his brain, thoracic and cervical spine were ordered. A cervical spine MRI showed cord compression at C6-C7 with cord edema. Neurosurgery was subsequently consulted and patient was emergently placed on Decadron. Patient is planned to go to the OR for decompression on Monday, 01/16/2011.   ROS: Mr. Weisgerber complains of abdominal pain. It is dull in quality. It is similar to the pain he has with irritable bowel flares. He states that he takes Phazyme and simethicone as needed at home for this pain. He denies any other pain. He denies dyspnea. His appetite is poor today.The patient feels that he is constipated and is requesting an enema.    Objective: Vital signs in last 24 hours: Temp:  [97.1 F (36.2 C)-97.9 F (36.6 C)] 97.1 F (36.2 C) (12/01 1438) Pulse Rate:  [66-73] 66  (12/01 1438) Resp:  [16-18] 16  (12/01 1438) BP: (125-140)/(75-80) 125/77 mmHg (12/01 1438) SpO2:  [96 %-100 %] 97 % (12/01 1438) Weight change:  Last BM Date: 01/12/11  Intake/Output from previous day:  Intake/Output Summary (Last 24 hours) at 01/14/11 1650 Last data filed at 01/14/11 0700  Gross per 24 hour  Intake      0 ml  Output   1975 ml  Net  -1975 ml     Physical Exam:  Gen:  No acute distress. Cardiovascular:  Heart sounds regular.  No murmurs, rubs, or gallops. Respiratory: Lungs clear to auscultation bilaterally. Gastrointestinal: Abdomen soft, mildly tender. No masses. Bowel sounds present x4. Extremities: No clubbing, edema, or cyanosis   Lab Results: Basic Metabolic Panel:  Lab 01/14/11 7829 01/12/11 0523 01/09/11 0718 01/08/11 1847 01/08/11 1146  NA 137 137 143 -- 141  K 4.0 4.1 -- -- --  CL 99 102 110 -- 103  CO2 27 25 26  -- 26  GLUCOSE 152* 159* 97 -- 111*  BUN 29* 25* 14 -- 12  CREATININE 1.03 0.94 0.95 0.88 1.02  CALCIUM 9.0 9.3 8.9 -- 9.8  MG -- -- -- -- --  PHOS -- -- -- -- --   GFR Estimated Creatinine Clearance: 68 ml/min (by C-G formula based on Cr of 1.03). Liver Function Tests:  Lab 01/08/11 1146  AST 14  ALT 13  ALKPHOS 105  BILITOT 0.6  PROT 7.0  ALBUMIN 3.8   CBC:  Lab 01/14/11 0800 01/12/11 0523 01/09/11 0718 01/08/11 1847 01/08/11 1146  WBC 9.2 11.9* 5.9 7.7 6.9  NEUTROABS -- -- -- -- 4.6  HGB 16.3 14.6 13.9 14.8 15.4  HCT 45.6 42.8 40.9 41.9 43.0  MCV 81.9 83.1 84.3 83.1 83.0  PLT 176 191 153 156 182   CBG:  Lab 01/20/11 1649  GLUCAP 158*    Studies/Results: Dg Chest 2 View 2011-01-20   IMPRESSION: No acute cardiopulmonary process seen.  Original Report Authenticated By: Tonia Ghent, M.D.    Medications:  Scheduled Meds:    . aspirin EC  81 mg Oral Daily  . ceFAZolin (ANCEF) IV  1 g Intravenous 60 min Pre-Op  . citalopram  10 mg Oral Daily  . dexamethasone  8 mg Intravenous QID  . dicyclomine  10 mg Oral TID AC & HS  . enoxaparin  40 mg Subcutaneous Q24H  . LORazepam  1 mg Intravenous Once  . metoprolol succinate  12.5 mg Oral BID  . pantoprazole  40 mg Oral Daily  . polyethylene glycol  17 g Oral BID  . simethicone  80 mg Oral QID  . simvastatin  20 mg Oral q1800  . Tamsulosin HCl  0.4 mg Oral BID   Continuous Infusions:  PRN Meds:.acetaminophen, acetaminophen, diazepam, gi cocktail, ondansetron (ZOFRAN) IV, ondansetron, oxyCODONE,  zolpidem Antibiotics: Anti-infectives     Start     Dose/Rate Route Frequency Ordered Stop   01/11/11 0000   ceFAZolin (ANCEF) IVPB 1 g/50 mL premix        1 g 100 mL/hr over 30 Minutes Intravenous 60 min pre-op 01/10/11 1230             Assessment/Plan:  Principal Problem:  *Cord compression Assessment: Severe cervical cord compression at C6-C7 with cord edema and/or myelomalacia. Severe spinal stenosis due to broad-based disc osteophyte complex and posterior ligamentum flavum redundancy. Plan: C6-7 anterior cervical discectomy fusion and plating planned for Monday by neurosurgery. Active Problems:  HTN (hypertension) Assessment: Controlled. Plan: Continue metoprolol.  Dyslipidemia Assessment: Stable  Plan: Continue with Zocor   BPH (benign prostatic hyperplasia) Assessment: An ongoing issue, but no difficulty urinating.  Plan: Continue with Flomax. Has Foley in place now.Will need voiding trail when foley discontinued.  GERD (gastroesophageal reflux disease) Assessment: Stable  Plan: Continue with Protonix.   Lumbar stenosis with neurogenic claudication Assessment: Stable, this is not the cause of patient's symptoms. MRI of the lumbar spine did not show significant pathology in this area.  Plan: monitor.  Irritable bowel syndrome (IBS) Assessment: Complains of abdominal pain today.  Plan: Continue simethicone, MiraLAX, Bentyl, and add a GI cocktail.   LOS: 6 days    01/14/2011, 4:50 PM

## 2011-01-14 NOTE — Progress Notes (Signed)
Filed Vitals:   01/13/11 0645 01/13/11 1400 01/13/11 2145 01/14/11 0645  BP: 118/71 167/89 140/80 135/75  Pulse: 71 65 73 70  Temp: 97.9 F (36.6 C) 97.4 F (36.3 C) 97.9 F (36.6 C) 97.9 F (36.6 C)  TempSrc: Oral Oral Oral Oral  Resp: 20 20 18 18   Height:      Weight:      SpO2: 93% 96% 96% 100%    CBC  Basename 01/14/11 0800 01/12/11 0523  WBC 9.2 11.9*  HGB 16.3 14.6  HCT 45.6 42.8  PLT 176 191   BMET  Basename 01/14/11 0800 01/12/11 0523  NA 137 137  K 4.0 4.1  CL 99 102  CO2 27 25  GLUCOSE 152* 159*  BUN 29* 25*  CREATININE 1.03 0.94  CALCIUM 9.0 9.3    Patient resting in bed comfortably. He notes weakness in the lower extremities and difficulty with ambulation. He is scheduled for surgery in 2 days with Dr Lovell Sheehan.   Plan: For OR December 3

## 2011-01-15 LAB — CREATININE, SERUM
Creatinine, Ser: 1.06 mg/dL (ref 0.50–1.35)
GFR calc Af Amer: 77 mL/min — ABNORMAL LOW (ref >90)
GFR calc non Af Amer: 67 mL/min — ABNORMAL LOW (ref >90)

## 2011-01-15 LAB — URINALYSIS, ROUTINE W REFLEX MICROSCOPIC
Bilirubin Urine: NEGATIVE
Ketones, ur: NEGATIVE mg/dL
Nitrite: NEGATIVE
Urobilinogen, UA: 0.2 mg/dL (ref 0.0–1.0)

## 2011-01-15 LAB — URINE MICROSCOPIC-ADD ON

## 2011-01-15 MED ORDER — LORATADINE 10 MG PO TABS
10.0000 mg | ORAL_TABLET | Freq: Every day | ORAL | Status: DC
  Administered 2011-01-15 – 2011-01-27 (×13): 10 mg via ORAL
  Filled 2011-01-15 (×14): qty 1

## 2011-01-15 NOTE — Progress Notes (Signed)
Subjective: Nurse reports blood in Foley bag. The patient has no further complaints today. The patient hadn't be some last night with good results. Objective: Filed Vitals:   01/14/11 1438 01/14/11 2215 01/14/11 2251 01/15/11 0645  BP: 125/77 154/81 154/81 157/89  Pulse: 66 66  68  Temp: 97.1 F (36.2 C) 98.1 F (36.7 C)  97.4 F (36.3 C)  TempSrc: Oral Oral  Oral  Resp: 16 20  20   Height:      Weight:      SpO2: 97% 96%  95%   Weight change:   Intake/Output Summary (Last 24 hours) at 01/15/11 1258 Last data filed at 01/15/11 1216  Gross per 24 hour  Intake      0 ml  Output   6200 ml  Net  -6200 ml    General: Alert, awake, oriented x3, in no acute distress.  Cardiovascular: Heart sounds regular. No murmurs, rubs, or gallops.  Respiratory: Lungs clear to auscultation bilaterally.  Gastrointestinal: Abdomen soft, mildly tender. No masses. Bowel sounds present x4.  Extremities: No clubbing, edema, or cyanosis Abdomen: Soft nontender no masses no hepatosplenomegaly is noted.   Lab Results:  Basename 01/15/11 0540 01/14/11 0800  NA -- 137  K -- 4.0  CL -- 99  CO2 -- 27  GLUCOSE -- 152*  BUN -- 29*  CREATININE 1.06 1.03  CALCIUM -- 9.0  MG -- --  PHOS -- --   No results found for this basename: AST:2,ALT:2,ALKPHOS:2,BILITOT:2,PROT:2,ALBUMIN:2 in the last 72 hours No results found for this basename: LIPASE:2,AMYLASE:2 in the last 72 hours  Basename 01/14/11 0800  WBC 9.2  NEUTROABS --  HGB 16.3  HCT 45.6  MCV 81.9  PLT 176   No results found for this basename: CKTOTAL:3,CKMB:3,CKMBINDEX:3,TROPONINI:3 in the last 72 hours No results found for this basename: POCBNP:3 in the last 72 hours No results found for this basename: DDIMER:2 in the last 72 hours No results found for this basename: HGBA1C:2 in the last 72 hours No results found for this basename: CHOL:2,HDL:2,LDLCALC:2,TRIG:2,CHOLHDL:2,LDLDIRECT:2 in the last 72 hours No results found for this basename:  TSH,T4TOTAL,FREET3,T3FREE,THYROIDAB in the last 72 hours No results found for this basename: VITAMINB12:2,FOLATE:2,FERRITIN:2,TIBC:2,IRON:2,RETICCTPCT:2 in the last 72 hours  Micro Results: No results found for this or any previous visit (from the past 240 hour(s)).  Studies/Results: Dg Chest 2 View  01/11/2011  *RADIOLOGY REPORT*  Clinical Data: Preoperative chest radiograph for cervical spine surgery.  CHEST - 2 VIEW  Comparison: Chest radiograph performed 01/08/2011  Findings: The lungs are well-aerated and clear.  There is no evidence of focal opacification, pleural effusion or pneumothorax. The right costophrenic angle is incompletely imaged on the frontal view.  The heart is normal in size; the mediastinal contour is within normal limits.  No acute osseous abnormalities are seen.  Mild anterior bridging osteophytes are noted along the lower thoracic spine.  IMPRESSION: No acute cardiopulmonary process seen.  Original Report Authenticated By: Tonia Ghent, M.D.   Dg Chest 2 View  01/08/2011  *RADIOLOGY REPORT*  Clinical Data: Numbness in both legs since yesterday.  Weakness.  CHEST - 2 VIEW  Comparison: CT 11/02/2010.  Plain films 08/18/2010.  Findings: Lateral view degraded by patient arm position.  Mild thoracic spondylosis.  Apical lordotic frontal view. Midline trachea.  Normal heart size and mediastinal contours. No pleural effusion or pneumothorax.  Clear lungs.  IMPRESSION: Normal chest.  Original Report Authenticated By: Consuello Bossier, M.D.   Ct Head Wo Contrast  01/08/2011  *  RADIOLOGY REPORT*  Clinical Data: Bilateral leg weakness  CT HEAD WITHOUT CONTRAST  Technique:  Contiguous axial images were obtained from the base of the skull through the vertex without contrast.  Comparison: 01/27/2007  Findings: Mild atrophy. There is no evidence of acute intracranial hemorrhage, brain edema, mass lesion, acute infarction,   mass effect, or midline shift. Acute infarct may be inapparent on  noncontrast CT.  No other intra-axial abnormalities are seen, and the ventricles and sulci are within normal limits in size and symmetry.   No abnormal extra-axial fluid collections or masses are identified.  No significant calvarial abnormality.  IMPRESSION: 1. Negative for bleed or other acute intracranial process.  Original Report Authenticated By: Osa Craver, M.D.   Mr Laqueta Jean EA Contrast  01/09/2011  *RADIOLOGY REPORT*  Clinical Data: Lower extremity weakness.  MRI HEAD WITHOUT AND WITH CONTRAST  Technique:  Multiplanar, multiecho pulse sequences of the brain and surrounding structures were obtained according to standard protocol without and with intravenous contrast  Contrast: 1 MULTIHANCE GADOBENATE DIMEGLUMINE 529 MG/ML IV SOLN  Comparison: CT 01/08/2011  Findings: Age appropriate atrophy.  Slight chronic microvascular ischemic change in the white matter.  Negative for acute infarct. Negative for hemorrhage or mass.  Postcontrast imaging the brain reveals normal enhancement.  Mild mucosal edema in the paranasal sinuses.  Joint effusion involving the right C1-C2 joint.  IMPRESSION: No acute intracranial abnormality.  Original Report Authenticated By: Camelia Phenes, M.D.   Mr Cervical Spine W Wo Contrast  01/09/2011  **ADDENDUM** CREATED: 01/09/2011 13:34:23  Critical Value/emergent results were called by telephone at the time of interpretation on 01/09/2011  at 1136 hours  to  Dr. Jerral Ralph, who verbally acknowledged these results.  **END ADDENDUM** SIGNED BY: Andreas Newport, M.D.   01/09/2011  *RADIOLOGY REPORT*  Clinical Data:  Leg weakness.  Lower extremity weakness and inability to ambulate.  Spinal stenosis.  MRI CERVICAL SPINE WITHOUT AND WITH CONTRAST  Technique:  Multiplanar and multiecho pulse sequences of the cervic al spine, to include the craniocervical junction and cervicothoraci c junction, were obtained according to standard protocol without an d with intravenous contrast.   Contrast: 15 ml Multihance  Comparison: None.  Findings:  Exaggeration of the normal cervical lordosis.  There appears to be ankylosis extending from C4-C6, with confluent anterior osteophytes. The appearance is compatible with diffuse idiopathic skeletal hyperostosis. Cervical cord edema is present associated with cervical cord compression at C6-C7.  There is probable ankylosis across C2-C3 as well.  Small right greater than left atlanto-occipital effusions are present.  Craniocervical junction appears normal. Nonspecific cystic appearing lesion is present in the right thyroid lobe measuring 15 mm.  C2-C3:  Central canal patent.  Left foraminal encroachment associated with facet arthrosis.  C3-C4:  Mild central stenosis associated with broad-based disc osteophyte complex.  Mild right posterior ligamentum flavum redundancy.  Disc osteophyte complex contacts the ventral aspect of the cervical cord and may flattened it slightly.  Right greater than left foraminal stenosis associated with uncovertebral spurring and facet arthrosis.  C4-C5:  Apparent ankylosis across the disc space.  Central canal and foramina appear patent.  C5-C6:  Facet hypertrophy.  Apparent ankylosis.  Mild central stenosis associated with osseous ridging.  No definite cord deformity.  C6-C7:  Severe central stenosis with compression of the cervical cord. Mild edema is present above and below the levels of compression.  Broad-based disc osteophyte complex and posterior ligamentum flavum redundancy produce the severe central stenosis.  Bilateral foraminal stenosis secondary uncovertebral spurring. Bilateral facet effusions.  C7-T1:  Severe right facet arthrosis with right foraminal stenosis potentially affecting the right C8 nerve.  The foramen appears patent. Minimal central stenosis associated with posterior element hypertrophy and ligamentum flavum redundancy.  IMPRESSION: 1.  Cervical cord compression at C6-C7 with cord edema and / or  myelomalacia.  Severe spinal stenosis due to broad-based disc osteophyte complex and posterior ligamentum flavum redundancy. 2.  Diffuse idiopathic skeletal hyperostosis with ankylosis from C4- C6, probably extending to C2 and C3 as well. 3.  Mild C3-C4 central stenosis secondary to disc osteophyte complex.  Right greater than left foraminal stenosis associated uncovertebral spurring.  MRI THORACIC SPINE WITHOUT AND WITH CONTRAST  Technique:  Multiplanar and multiecho pulse sequences of the thoracic spine were obtained without and with intravenous contrast.  Contrast: 15 MULTIHANCE GADOBENATE DIMEGLUMINE 529 MG/ML IV SOLN  Findings:  Mild dextroconvex curvature of the thoracic spine is present.  Remote compression fractures T7 and T8, with approximately 25% loss of vertebral body height.  No marrow edema. The thoracic cord demonstrates normal caliber and signal. Paraspinal soft tissues are within normal limits.  Scattered vertebral body hemangiomata are present.  High signal is present within the discs at T6-T7 and T7-T8 with post-gadolinium enhancement.  There is no endplate edema, erosion or enhancement and this is compatible with degenerative enhancement of the discs.  This may also be due to ossification of the discs with development of marrow space.  Thoracic spine DISH is present.  Tiny central disc protrusion is present at T7-T8, just contacting the ventral aspect of the thoracic cord.  Per CMS PQRS reporting requirements (PQRS Measure 24): Given the patient's age of greater than 50 and the fracture site (hip, distal radius, or spine), the patient should be tested for osteoporosis using DXA, and the appropriate treatment considered based on the DXA results.  IMPRESSION: No thoracic cord compression.  Thoracic spine DISH and chronic compression fractures of T7 and T8 (25% loss of height) without retropulsion. Original Report Authenticated By: Andreas Newport, M.D.   Mr Thoracic Spine W Wo  Contrast  01/09/2011  **ADDENDUM** CREATED: 01/09/2011 13:34:23  Critical Value/emergent results were called by telephone at the time of interpretation on 01/09/2011  at 1136 hours  to  Dr. Jerral Ralph, who verbally acknowledged these results.  **END ADDENDUM** SIGNED BY: Andreas Newport, M.D.   01/09/2011  *RADIOLOGY REPORT*  Clinical Data:  Leg weakness.  Lower extremity weakness and inability to ambulate.  Spinal stenosis.  MRI CERVICAL SPINE WITHOUT AND WITH CONTRAST  Technique:  Multiplanar and multiecho pulse sequences of the cervic al spine, to include the craniocervical junction and cervicothoraci c junction, were obtained according to standard protocol without an d with intravenous contrast.  Contrast: 15 ml Multihance  Comparison: None.  Findings:  Exaggeration of the normal cervical lordosis.  There appears to be ankylosis extending from C4-C6, with confluent anterior osteophytes. The appearance is compatible with diffuse idiopathic skeletal hyperostosis. Cervical cord edema is present associated with cervical cord compression at C6-C7.  There is probable ankylosis across C2-C3 as well.  Small right greater than left atlanto-occipital effusions are present.  Craniocervical junction appears normal. Nonspecific cystic appearing lesion is present in the right thyroid lobe measuring 15 mm.  C2-C3:  Central canal patent.  Left foraminal encroachment associated with facet arthrosis.  C3-C4:  Mild central stenosis associated with broad-based disc osteophyte complex.  Mild right posterior ligamentum flavum redundancy.  Disc osteophyte complex  contacts the ventral aspect of the cervical cord and may flattened it slightly.  Right greater than left foraminal stenosis associated with uncovertebral spurring and facet arthrosis.  C4-C5:  Apparent ankylosis across the disc space.  Central canal and foramina appear patent.  C5-C6:  Facet hypertrophy.  Apparent ankylosis.  Mild central stenosis associated with osseous  ridging.  No definite cord deformity.  C6-C7:  Severe central stenosis with compression of the cervical cord. Mild edema is present above and below the levels of compression.  Broad-based disc osteophyte complex and posterior ligamentum flavum redundancy produce the severe central stenosis. Bilateral foraminal stenosis secondary uncovertebral spurring. Bilateral facet effusions.  C7-T1:  Severe right facet arthrosis with right foraminal stenosis potentially affecting the right C8 nerve.  The foramen appears patent. Minimal central stenosis associated with posterior element hypertrophy and ligamentum flavum redundancy.  IMPRESSION: 1.  Cervical cord compression at C6-C7 with cord edema and / or myelomalacia.  Severe spinal stenosis due to broad-based disc osteophyte complex and posterior ligamentum flavum redundancy. 2.  Diffuse idiopathic skeletal hyperostosis with ankylosis from C4- C6, probably extending to C2 and C3 as well. 3.  Mild C3-C4 central stenosis secondary to disc osteophyte complex.  Right greater than left foraminal stenosis associated uncovertebral spurring.  MRI THORACIC SPINE WITHOUT AND WITH CONTRAST  Technique:  Multiplanar and multiecho pulse sequences of the thoracic spine were obtained without and with intravenous contrast.  Contrast: 15 MULTIHANCE GADOBENATE DIMEGLUMINE 529 MG/ML IV SOLN  Findings:  Mild dextroconvex curvature of the thoracic spine is present.  Remote compression fractures T7 and T8, with approximately 25% loss of vertebral body height.  No marrow edema. The thoracic cord demonstrates normal caliber and signal. Paraspinal soft tissues are within normal limits.  Scattered vertebral body hemangiomata are present.  High signal is present within the discs at T6-T7 and T7-T8 with post-gadolinium enhancement.  There is no endplate edema, erosion or enhancement and this is compatible with degenerative enhancement of the discs.  This may also be due to ossification of the discs with  development of marrow space.  Thoracic spine DISH is present.  Tiny central disc protrusion is present at T7-T8, just contacting the ventral aspect of the thoracic cord.  Per CMS PQRS reporting requirements (PQRS Measure 24): Given the patient's age of greater than 50 and the fracture site (hip, distal radius, or spine), the patient should be tested for osteoporosis using DXA, and the appropriate treatment considered based on the DXA results.  IMPRESSION: No thoracic cord compression.  Thoracic spine DISH and chronic compression fractures of T7 and T8 (25% loss of height) without retropulsion. Original Report Authenticated By: Andreas Newport, M.D.   Mr Lumbar Spine W Wo Contrast  01/08/2011  *RADIOLOGY REPORT*  Clinical Data: Bilateral lower extremity weakness.  History of prior lumbar spine surgery.  Progressive worsening weakness in the lower extremities.  MRI LUMBAR SPINE WITHOUT AND WITH CONTRAST  Technique:  Multiplanar and multiecho pulse sequences of the lumbar spine were obtained without and with intravenous contrast.  Contrast:  17 ml Multihance.  Comparison: MRI lumbar spine 09/14/2010.  Findings: Numbering convention used on prior exam preserved. Vertebral body height and marrow signal is within normal limits. Paraspinal soft tissues are within normal limits.  Distention of the urinary bladder is present.  No destructive osseous lesions. Postoperative changes of L4 laminectomy.  The small fluid collection in the operative bed seen on the recent comparison has resolved.  T12-L1:  Negative.  L1-L2:  Shallow  posterior disc bulging without stenosis.  Mild facet hypertrophy and ligamentum flavum redundancy.  L2-L3:  Mild facet hypertrophy.  No stenosis.  L3-L4:  Bilateral facet hypertrophy and ligamentum flavum redundancy with mild central stenosis.  Unchanged mild symmetric bilateral foraminal stenosis.  L4-L5:  Laminectomy with wide posterior decompression.  Central canal and lateral recesses are patent.   The enhancing granulation tissue is present in the operative bed and the epidural space.  No epidural abscess or fluid collection.  Short pedicles are present with mild left foraminal stenosis.  Facet hypertrophy is present.  L5-S1:  Disc desiccation and degeneration.  Degenerative endplate changes are present.  Central canal and lateral recesses are patent.  Mild left foraminal stenosis associated with endplate spurring and facet hypertrophy.  The left greater than right facet degeneration/hypertrophy.  IMPRESSION:  1.  Evolving postoperative changes of L4 laminectomy with good decompression of the thecal sac and lateral recesses.  Mild left foraminal stenosis associated with short pedicles and facet hypertrophy potentially affecting the left L4 nerve. Resolution of small fluid collection in the operative bed.  Expected enhancing granulation tissue in the operative bed. 2.  Unchanged L5-S1 degenerative disease with mild left foraminal stenosis secondary endplate spurring and facet hypertrophy. 3.  Unchanged L3-L4 facet arthrosis with mild central stenosis.  Original Report Authenticated By: Andreas Newport, M.D.    Medications: I have reviewed the patient's current medications. Scheduled Meds:   . aspirin EC  81 mg Oral Daily  . ceFAZolin (ANCEF) IV  1 g Intravenous 60 min Pre-Op  . citalopram  10 mg Oral Daily  . dexamethasone  8 mg Intravenous QID  . dicyclomine  10 mg Oral TID AC & HS  . enoxaparin  40 mg Subcutaneous Q24H  . loratadine  10 mg Oral Once  . LORazepam  1 mg Intravenous Once  . metoprolol succinate  12.5 mg Oral BID  . pantoprazole  40 mg Oral Daily  . polyethylene glycol  17 g Oral BID  . simethicone  80 mg Oral QID  . simvastatin  20 mg Oral q1800  . sodium phosphate  1 enema Rectal Once  . Tamsulosin HCl  0.4 mg Oral BID   Continuous Infusions:  PRN Meds:.acetaminophen, acetaminophen, diazepam, gi cocktail, ondansetron (ZOFRAN) IV, ondansetron, oxyCODONE,  zolpidem Assessment/Plan: Principal Problem:  *Cord compression  Assessment: Severe cervical cord compression at C6-C7 with cord edema and/or myelomalacia. Severe spinal stenosis due to broad-based disc osteophyte complex and posterior ligamentum flavum redundancy.  Plan: C6-7 anterior cervical discectomy fusion and plating planned for tomorrow by neurosurgery.   Hematuria The patient had some transient hematuria. At the point of my evaluation the urine has cleared and is now clear in the Foley catheter. I suspect that the hematuria is likely secondary to trauma with a Foley or dislodge clot at the tip of the Foley catheter. Nevertheless I will go ahead and send the urine for UA culture and sensitivity to ensure the patient does not have a urinary tract infection. Continue to monitor the hematuria.  Active Problems:  HTN (hypertension)  Assessment: Controlled.  Plan: Continue metoprolol.  Dyslipidemia  Assessment: Stable  Plan: Continue with Zocor  BPH (benign prostatic hyperplasia)  Assessment: An ongoing issue, but no difficulty urinating.  Plan: Continue with Flomax. Has Foley in place now.Will need voiding trail when foley discontinued.  GERD (gastroesophageal reflux disease)  Assessment: Stable  Plan: Continue with Protonix.  Lumbar stenosis with neurogenic claudication  Assessment: Stable, this is not the  cause of patient's symptoms. MRI of the lumbar spine did not show significant pathology in this area.  Plan: monitor.  Irritable bowel syndrome (IBS)  Assessment: Complains of abdominal pain today.  Plan: Continue simethicone, MiraLAX, Bentyl.         LOS: 7 days

## 2011-01-15 NOTE — Progress Notes (Signed)
Filed Vitals:   01/14/11 1438 01/14/11 2215 01/14/11 2251 01/15/11 0645  BP: 125/77 154/81 154/81 157/89  Pulse: 66 66  68  Temp: 97.1 F (36.2 C) 98.1 F (36.7 C)  97.4 F (36.3 C)  TempSrc: Oral Oral  Oral  Resp: 16 20  20   Height:      Weight:      SpO2: 97% 96%  95%    CBC  Basename 01/14/11 0800  WBC 9.2  HGB 16.3  HCT 45.6  PLT 176   BMET  Basename 01/15/11 0540 01/14/11 0800  NA -- 137  K -- 4.0  CL -- 99  CO2 -- 27  GLUCOSE -- 152*  BUN -- 29*  CREATININE 1.06 1.03  CALCIUM -- 9.0    For OR tomorrow with Dr. Lovell Sheehan, without new complaints today. Laboratories from yesterday look good. Neurologically without change.   Plan: OR in a.m.

## 2011-01-16 ENCOUNTER — Encounter (HOSPITAL_COMMUNITY)
Admission: EM | Disposition: A | Discharge status: Rehabilitation Facility | Payer: Self-pay | Source: Ambulatory Visit | Attending: Internal Medicine

## 2011-01-16 ENCOUNTER — Encounter (HOSPITAL_COMMUNITY): Payer: Self-pay | Admitting: Anesthesiology

## 2011-01-16 ENCOUNTER — Inpatient Hospital Stay (HOSPITAL_COMMUNITY): Payer: Medicare Other | Admitting: Anesthesiology

## 2011-01-16 ENCOUNTER — Inpatient Hospital Stay (HOSPITAL_COMMUNITY): Payer: Medicare Other

## 2011-01-16 DIAGNOSIS — R310 Gross hematuria: Secondary | ICD-10-CM

## 2011-01-16 HISTORY — PX: ANTERIOR CERVICAL DECOMP/DISCECTOMY FUSION: SHX1161

## 2011-01-16 LAB — CBC
HCT: 40.9 % (ref 39.0–52.0)
Hemoglobin: 14.9 g/dL (ref 13.0–17.0)
MCH: 29.4 pg (ref 26.0–34.0)
MCHC: 36.4 g/dL — ABNORMAL HIGH (ref 30.0–36.0)
RDW: 13.4 % (ref 11.5–15.5)

## 2011-01-16 SURGERY — ANTERIOR CERVICAL DECOMPRESSION/DISCECTOMY FUSION 1 LEVEL
Anesthesia: General

## 2011-01-16 MED ORDER — PHENOL 1.4 % MT LIQD: 1.0000 | OROMUCOSAL | Status: DC | PRN

## 2011-01-16 MED ORDER — ALBUMIN HUMAN 5 % IV SOLN
INTRAVENOUS | Status: DC | PRN
  Administered 2011-01-16: 08:00:00 via INTRAVENOUS

## 2011-01-16 MED ORDER — LACTATED RINGERS IV SOLN
INTRAVENOUS | Status: DC | PRN
  Administered 2011-01-16 (×2): via INTRAVENOUS

## 2011-01-16 MED ORDER — THROMBIN 5000 UNITS EX KIT
PACK | CUTANEOUS | Status: DC | PRN
  Administered 2011-01-16 (×2): 5000 [IU] via TOPICAL

## 2011-01-16 MED ORDER — MORPHINE SULFATE 4 MG/ML IJ SOLN
1.0000 mg | INTRAMUSCULAR | Status: DC | PRN
  Administered 2011-01-16 – 2011-01-17 (×2): 4 mg via INTRAVENOUS
  Administered 2011-01-21: 2 mg via INTRAVENOUS
  Administered 2011-01-21 – 2011-01-22 (×7): 4 mg via INTRAVENOUS
  Filled 2011-01-16 (×10): qty 1

## 2011-01-16 MED ORDER — PROMETHAZINE HCL 25 MG/ML IJ SOLN
6.2500 mg | INTRAMUSCULAR | Status: DC | PRN
  Filled 2011-01-16: qty 1

## 2011-01-16 MED ORDER — SODIUM CHLORIDE 0.9 % IJ SOLN: 3.0000 mL | INTRAMUSCULAR | Status: DC | PRN

## 2011-01-16 MED ORDER — CEFAZOLIN SODIUM 1-5 GM-% IV SOLN
1.0000 g | Freq: Three times a day (TID) | INTRAVENOUS | Status: AC
  Administered 2011-01-16 (×2): 1 g via INTRAVENOUS
  Filled 2011-01-16 (×2): qty 50

## 2011-01-16 MED ORDER — BACITRACIN 50000 UNITS IM SOLR
INTRAMUSCULAR | Status: AC
  Filled 2011-01-16: qty 50000

## 2011-01-16 MED ORDER — SODIUM CHLORIDE 0.9 % IV SOLN
INTRAVENOUS | Status: AC
  Filled 2011-01-16: qty 500

## 2011-01-16 MED ORDER — FENTANYL CITRATE 0.05 MG/ML IJ SOLN
INTRAMUSCULAR | Status: DC | PRN
  Administered 2011-01-16: 50 ug via INTRAVENOUS
  Administered 2011-01-16: 100 ug via INTRAVENOUS
  Administered 2011-01-16: 25 ug via INTRAVENOUS

## 2011-01-16 MED ORDER — BACITRACIN ZINC 500 UNIT/GM EX OINT
TOPICAL_OINTMENT | CUTANEOUS | Status: DC | PRN
  Administered 2011-01-16: 1 via TOPICAL

## 2011-01-16 MED ORDER — HYPROMELLOSE (GONIOSCOPIC) 2.5 % OP SOLN
1.0000 [drp] | Freq: Four times a day (QID) | OPHTHALMIC | Status: DC | PRN
  Filled 2011-01-16: qty 15

## 2011-01-16 MED ORDER — ACETAMINOPHEN 650 MG RE SUPP: 650.0000 mg | RECTAL | Status: DC | PRN

## 2011-01-16 MED ORDER — FENTANYL CITRATE 0.05 MG/ML IJ SOLN: 50.0000 ug | Freq: Once | INTRAMUSCULAR | Status: DC

## 2011-01-16 MED ORDER — DOCUSATE SODIUM 100 MG PO CAPS
100.0000 mg | ORAL_CAPSULE | Freq: Two times a day (BID) | ORAL | Status: DC
  Administered 2011-01-16 – 2011-01-23 (×14): 100 mg via ORAL
  Filled 2011-01-16 (×14): qty 1

## 2011-01-16 MED ORDER — BUPIVACAINE-EPINEPHRINE 0.5% -1:200000 IJ SOLN
INTRAMUSCULAR | Status: DC | PRN
  Administered 2011-01-16: 10 mL

## 2011-01-16 MED ORDER — SODIUM CHLORIDE 0.9 % IV SOLN
250.0000 mL | INTRAVENOUS | Status: DC
  Administered 2011-01-22: 500 mL via INTRAVENOUS

## 2011-01-16 MED ORDER — ROCURONIUM BROMIDE 100 MG/10ML IV SOLN
INTRAVENOUS | Status: DC | PRN
  Administered 2011-01-16: 10 mg via INTRAVENOUS
  Administered 2011-01-16: 40 mg via INTRAVENOUS

## 2011-01-16 MED ORDER — DEXAMETHASONE SODIUM PHOSPHATE 4 MG/ML IJ SOLN
INTRAMUSCULAR | Status: DC | PRN
  Administered 2011-01-16: 8 mg via INTRAVENOUS

## 2011-01-16 MED ORDER — CEFAZOLIN SODIUM 1-5 GM-% IV SOLN
1.0000 g | Freq: Once | INTRAVENOUS | Status: DC
  Filled 2011-01-16: qty 50

## 2011-01-16 MED ORDER — ZOLPIDEM TARTRATE 5 MG PO TABS
5.0000 mg | ORAL_TABLET | Freq: Every evening | ORAL | Status: DC | PRN
  Administered 2011-01-18 – 2011-01-22 (×3): 5 mg via ORAL
  Filled 2011-01-16 (×3): qty 1

## 2011-01-16 MED ORDER — ONDANSETRON HCL 4 MG/2ML IJ SOLN
INTRAMUSCULAR | Status: DC | PRN
  Administered 2011-01-16: 4 mg via INTRAVENOUS

## 2011-01-16 MED ORDER — MIDAZOLAM HCL 2 MG/2ML IJ SOLN: 0.5000 mg | Freq: Once | INTRAMUSCULAR | Status: AC | PRN

## 2011-01-16 MED ORDER — GLYCOPYRROLATE 0.2 MG/ML IJ SOLN
INTRAMUSCULAR | Status: DC | PRN
  Administered 2011-01-16: .6 mg via INTRAVENOUS

## 2011-01-16 MED ORDER — LACTATED RINGERS IV SOLN
INTRAVENOUS | Status: DC
  Administered 2011-01-16 – 2011-01-22 (×2): via INTRAVENOUS

## 2011-01-16 MED ORDER — NEOSTIGMINE METHYLSULFATE 1 MG/ML IJ SOLN
INTRAMUSCULAR | Status: DC | PRN
  Administered 2011-01-16: 4 mg via INTRAVENOUS

## 2011-01-16 MED ORDER — PROPOFOL 10 MG/ML IV BOLUS
INTRAVENOUS | Status: DC | PRN
  Administered 2011-01-16: 150 mg via INTRAVENOUS

## 2011-01-16 MED ORDER — HEMOSTATIC AGENTS (NO CHARGE) OPTIME
TOPICAL | Status: DC | PRN
  Administered 2011-01-16: 1 via TOPICAL

## 2011-01-16 MED ORDER — SODIUM CHLORIDE 0.9 % IJ SOLN
3.0000 mL | Freq: Two times a day (BID) | INTRAMUSCULAR | Status: DC
  Administered 2011-01-16 – 2011-01-23 (×15): 3 mL via INTRAVENOUS

## 2011-01-16 MED ORDER — MENTHOL 3 MG MT LOZG: 1.0000 | LOZENGE | OROMUCOSAL | Status: DC | PRN

## 2011-01-16 MED ORDER — POLYVINYL ALCOHOL 1.4 % OP SOLN
1.0000 [drp] | OPHTHALMIC | Status: DC | PRN
  Administered 2011-01-16: 1 [drp] via OPHTHALMIC
  Filled 2011-01-16 (×2): qty 15

## 2011-01-16 MED ORDER — ONDANSETRON HCL 4 MG/2ML IJ SOLN: 4.0000 mg | INTRAMUSCULAR | Status: DC | PRN

## 2011-01-16 SURGICAL SUPPLY — 61 items
APL SKNCLS STERI-STRIP NONHPOA (GAUZE/BANDAGES/DRESSINGS) ×1
BAG DECANTER FOR FLEXI CONT (MISCELLANEOUS) ×2 IMPLANT
BENZOIN TINCTURE PRP APPL 2/3 (GAUZE/BANDAGES/DRESSINGS) ×2 IMPLANT
BIT DRILL SPINE QC 14 (BIT) ×1 IMPLANT
BLADE SURG 15 STRL LF DISP TIS (BLADE) ×1 IMPLANT
BLADE SURG 15 STRL SS (BLADE) ×2
BLADE ULTRA TIP 2M (BLADE) ×2 IMPLANT
BRUSH SCRUB EZ PLAIN DRY (MISCELLANEOUS) ×2 IMPLANT
BUR BARREL STRAIGHT FLUTE 4.0 (BURR) ×2 IMPLANT
BUR MATCHSTICK NEURO 3.0 LAGG (BURR) ×2 IMPLANT
CANISTER SUCTION 2500CC (MISCELLANEOUS) ×2 IMPLANT
CLIP TI MEDIUM 6 (CLIP) ×1 IMPLANT
CLOSURE STERI STRIP 1/2 X4 (GAUZE/BANDAGES/DRESSINGS) ×1 IMPLANT
CLOTH BEACON ORANGE TIMEOUT ST (SAFETY) ×2 IMPLANT
CONT SPEC 4OZ CLIKSEAL STRL BL (MISCELLANEOUS) ×2 IMPLANT
COVER MAYO STAND STRL (DRAPES) ×2 IMPLANT
DRAPE LAPAROTOMY 100X72 PEDS (DRAPES) ×2 IMPLANT
DRAPE MICROSCOPE LEICA (MISCELLANEOUS) IMPLANT
DRAPE POUCH INSTRU U-SHP 10X18 (DRAPES) ×2 IMPLANT
DRAPE SURG 17X23 STRL (DRAPES) ×4 IMPLANT
ELECT REM PT RETURN 9FT ADLT (ELECTROSURGICAL) ×2
ELECTRODE REM PT RTRN 9FT ADLT (ELECTROSURGICAL) ×1 IMPLANT
GAUZE SPONGE 4X4 16PLY XRAY LF (GAUZE/BANDAGES/DRESSINGS) IMPLANT
GLOVE BIO SURGEON STRL SZ8.5 (GLOVE) ×3 IMPLANT
GLOVE ECLIPSE 6.5 STRL STRAW (GLOVE) ×1 IMPLANT
GLOVE ECLIPSE 7.0 STRL STRAW (GLOVE) ×1 IMPLANT
GLOVE EXAM NITRILE LRG STRL (GLOVE) IMPLANT
GLOVE EXAM NITRILE MD LF STRL (GLOVE) IMPLANT
GLOVE EXAM NITRILE XL STR (GLOVE) IMPLANT
GLOVE EXAM NITRILE XS STR PU (GLOVE) IMPLANT
GLOVE INDICATOR 7.0 STRL GRN (GLOVE) ×1 IMPLANT
GLOVE SS BIOGEL STRL SZ 8 (GLOVE) ×1 IMPLANT
GLOVE SUPERSENSE BIOGEL SZ 8 (GLOVE) ×2
GOWN BRE IMP SLV AUR LG STRL (GOWN DISPOSABLE) IMPLANT
GOWN BRE IMP SLV AUR XL STRL (GOWN DISPOSABLE) ×3 IMPLANT
KIT BASIN OR (CUSTOM PROCEDURE TRAY) ×2 IMPLANT
KIT ROOM TURNOVER OR (KITS) ×2 IMPLANT
MARKER SKIN DUAL TIP RULER LAB (MISCELLANEOUS) ×2 IMPLANT
NDL SPNL 18GX3.5 QUINCKE PK (NEEDLE) ×1 IMPLANT
NEEDLE HYPO 22GX1.5 SAFETY (NEEDLE) ×2 IMPLANT
NEEDLE SPNL 18GX3.5 QUINCKE PK (NEEDLE) ×4 IMPLANT
NS IRRIG 1000ML POUR BTL (IV SOLUTION) ×2 IMPLANT
PACK LAMINECTOMY NEURO (CUSTOM PROCEDURE TRAY) ×2 IMPLANT
PIN DISTRACTION 14MM (PIN) ×4 IMPLANT
PLATE ANT CERV XTEND 1 LV 20 (Plate) ×1 IMPLANT
PUTTY 2.5ML ACTIFUSE ABX (Putty) ×2 IMPLANT
RUBBERBAND STERILE (MISCELLANEOUS) IMPLANT
SCREW XTD VAR 4.2 SELF TAP (Screw) ×4 IMPLANT
SPONGE GAUZE 4X4 12PLY (GAUZE/BANDAGES/DRESSINGS) ×2 IMPLANT
SPONGE INTESTINAL PEANUT (DISPOSABLE) ×3 IMPLANT
SPONGE SURGIFOAM ABS GEL SZ50 (HEMOSTASIS) ×2 IMPLANT
STRIP CLOSURE SKIN 1/2X4 (GAUZE/BANDAGES/DRESSINGS) ×2 IMPLANT
SUT VIC AB 0 CT1 27 (SUTURE) ×2
SUT VIC AB 0 CT1 27XBRD ANTBC (SUTURE) ×1 IMPLANT
SUT VIC AB 3-0 SH 8-18 (SUTURE) ×2 IMPLANT
SYR 20ML ECCENTRIC (SYRINGE) ×2 IMPLANT
TAPE CLOTH SURG 4X10 WHT LF (GAUZE/BANDAGES/DRESSINGS) ×1 IMPLANT
TOWEL OR 17X24 6PK STRL BLUE (TOWEL DISPOSABLE) ×2 IMPLANT
TOWEL OR 17X26 10 PK STRL BLUE (TOWEL DISPOSABLE) ×2 IMPLANT
WATER STERILE IRR 1000ML POUR (IV SOLUTION) ×2 IMPLANT
spinal vista 14x14mm parallel ×1 IMPLANT

## 2011-01-16 NOTE — Anesthesia Preprocedure Evaluation (Addendum)
Anesthesia Evaluation  Patient identified by MRN, date of birth, ID band Patient awake    Reviewed: Allergy & Precautions, H&P , NPO status , Patient's Chart, lab work & pertinent test results, reviewed documented beta blocker date and time   Airway Mallampati: II      Dental  (+) Dental Advisory Given   Pulmonary  clear to auscultation        Cardiovascular hypertension, Pt. on medications Regular     Neuro/Psych    GI/Hepatic Neg liver ROS, GERD-  ,  Endo/Other  Negative Endocrine ROS  Renal/GU negative Renal ROS     Musculoskeletal   Abdominal   Peds  Hematology   Anesthesia Other Findings   Reproductive/Obstetrics                         Anesthesia Physical Anesthesia Plan  ASA: III  Anesthesia Plan: General   Post-op Pain Management:    Induction: Intravenous  Airway Management Planned: Oral ETT  Additional Equipment:   Intra-op Plan:   Post-operative Plan: Extubation in OR  Informed Consent: I have reviewed the patients History and Physical, chart, labs and discussed the procedure including the risks, benefits and alternatives for the proposed anesthesia with the patient or authorized representative who has indicated his/her understanding and acceptance.   Dental advisory given  Plan Discussed with: CRNA  Anesthesia Plan Comments:         Anesthesia Quick Evaluation

## 2011-01-16 NOTE — Progress Notes (Signed)
Subjective: The patient's Foley bag has dark red urine reflecting gross hematuria. The patient has no complaints of pain beyond that associated with his surgical. The patient is seen by me status post his neurosurgical procedure involving C6-C7 vertebra. Objective: Filed Vitals:   01/16/11 1008 01/16/11 1011 01/16/11 1030 01/16/11 1249  BP: 142/79  140/77   Pulse: 73 73 75 64  Temp:  97.6 F (36.4 C) 97.2 F (36.2 C)   TempSrc:      Resp: 29 26 18    Height:      Weight:      SpO2: 97% 97% 95%    Weight change:   Intake/Output Summary (Last 24 hours) at 01/16/11 1436 Last data filed at 01/16/11 0920  Gross per 24 hour  Intake   1450 ml  Output   3875 ml  Net  -2425 ml    General: Alert, awake, oriented x3, in no acute distress.  Cardiovascular: Heart sounds regular. No murmurs, rubs, or gallops.  Respiratory: Lungs clear to auscultation bilaterally.  Gastrointestinal: Abdomen soft, mildly tender. No masses. Bowel sounds present x4.  Extremities: No clubbing, edema, or cyanosis Abdomen: Soft nontender no masses no hepatosplenomegaly is noted.   Lab Results:  Basename 01/15/11 0540 01/14/11 0800  NA -- 137  K -- 4.0  CL -- 99  CO2 -- 27  GLUCOSE -- 152*  BUN -- 29*  CREATININE 1.06 1.03  CALCIUM -- 9.0  MG -- --  PHOS -- --   No results found for this basename: AST:2,ALT:2,ALKPHOS:2,BILITOT:2,PROT:2,ALBUMIN:2 in the last 72 hours No results found for this basename: LIPASE:2,AMYLASE:2 in the last 72 hours  Basename 01/14/11 0800  WBC 9.2  NEUTROABS --  HGB 16.3  HCT 45.6  MCV 81.9  PLT 176   No results found for this basename: CKTOTAL:3,CKMB:3,CKMBINDEX:3,TROPONINI:3 in the last 72 hours No results found for this basename: POCBNP:3 in the last 72 hours No results found for this basename: DDIMER:2 in the last 72 hours No results found for this basename: HGBA1C:2 in the last 72 hours No results found for this basename:  CHOL:2,HDL:2,LDLCALC:2,TRIG:2,CHOLHDL:2,LDLDIRECT:2 in the last 72 hours No results found for this basename: TSH,T4TOTAL,FREET3,T3FREE,THYROIDAB in the last 72 hours No results found for this basename: VITAMINB12:2,FOLATE:2,FERRITIN:2,TIBC:2,IRON:2,RETICCTPCT:2 in the last 72 hours  Micro Results: Recent Results (from the past 240 hour(s))  URINE CULTURE     Status: Normal (Preliminary result)   Collection Time   01/15/11  1:38 PM      Component Value Range Status Comment   Specimen Description URINE, CATHETERIZED   Final    Special Requests NONE   Final    Setup Time [id number]   Final    Colony Count PENDING   Incomplete    Culture Culture reincubated for better growth   Final    Report Status PENDING   Incomplete     Studies/Results: Dg Chest 2 View  01/11/2011  *RADIOLOGY REPORT*  Clinical Data: Preoperative chest radiograph for cervical spine surgery.  CHEST - 2 VIEW  Comparison: Chest radiograph performed 01/08/2011  Findings: The lungs are well-aerated and clear.  There is no evidence of focal opacification, pleural effusion or pneumothorax. The right costophrenic angle is incompletely imaged on the frontal view.  The heart is normal in size; the mediastinal contour is within normal limits.  No acute osseous abnormalities are seen.  Mild anterior bridging osteophytes are noted along the lower thoracic spine.  IMPRESSION: No acute cardiopulmonary process seen.  Original Report Authenticated By: JEFFREY  Cherly Hensen, M.D.   Dg Chest 2 View  01/08/2011  *RADIOLOGY REPORT*  Clinical Data: Numbness in both legs since yesterday.  Weakness.  CHEST - 2 VIEW  Comparison: CT 11/02/2010.  Plain films 08/18/2010.  Findings: Lateral view degraded by patient arm position.  Mild thoracic spondylosis.  Apical lordotic frontal view. Midline trachea.  Normal heart size and mediastinal contours. No pleural effusion or pneumothorax.  Clear lungs.  IMPRESSION: Normal chest.  Original Report Authenticated By:  Consuello Bossier, M.D.   Ct Head Wo Contrast  01/08/2011  *RADIOLOGY REPORT*  Clinical Data: Bilateral leg weakness  CT HEAD WITHOUT CONTRAST  Technique:  Contiguous axial images were obtained from the base of the skull through the vertex without contrast.  Comparison: 01/27/2007  Findings: Mild atrophy. There is no evidence of acute intracranial hemorrhage, brain edema, mass lesion, acute infarction,   mass effect, or midline shift. Acute infarct may be inapparent on noncontrast CT.  No other intra-axial abnormalities are seen, and the ventricles and sulci are within normal limits in size and symmetry.   No abnormal extra-axial fluid collections or masses are identified.  No significant calvarial abnormality.  IMPRESSION: 1. Negative for bleed or other acute intracranial process.  Original Report Authenticated By: Osa Craver, M.D.   Mr Laqueta Jean WU Contrast  01/09/2011  *RADIOLOGY REPORT*  Clinical Data: Lower extremity weakness.  MRI HEAD WITHOUT AND WITH CONTRAST  Technique:  Multiplanar, multiecho pulse sequences of the brain and surrounding structures were obtained according to standard protocol without and with intravenous contrast  Contrast: 1 MULTIHANCE GADOBENATE DIMEGLUMINE 529 MG/ML IV SOLN  Comparison: CT 01/08/2011  Findings: Age appropriate atrophy.  Slight chronic microvascular ischemic change in the white matter.  Negative for acute infarct. Negative for hemorrhage or mass.  Postcontrast imaging the brain reveals normal enhancement.  Mild mucosal edema in the paranasal sinuses.  Joint effusion involving the right C1-C2 joint.  IMPRESSION: No acute intracranial abnormality.  Original Report Authenticated By: Camelia Phenes, M.D.   Mr Cervical Spine W Wo Contrast  01/09/2011  **ADDENDUM** CREATED: 01/09/2011 13:34:23  Critical Value/emergent results were called by telephone at the time of interpretation on 01/09/2011  at 1136 hours  to  Dr. Jerral Ralph, who verbally acknowledged these  results.  **END ADDENDUM** SIGNED BY: Andreas Newport, M.D.   01/09/2011  *RADIOLOGY REPORT*  Clinical Data:  Leg weakness.  Lower extremity weakness and inability to ambulate.  Spinal stenosis.  MRI CERVICAL SPINE WITHOUT AND WITH CONTRAST  Technique:  Multiplanar and multiecho pulse sequences of the cervic al spine, to include the craniocervical junction and cervicothoraci c junction, were obtained according to standard protocol without an d with intravenous contrast.  Contrast: 15 ml Multihance  Comparison: None.  Findings:  Exaggeration of the normal cervical lordosis.  There appears to be ankylosis extending from C4-C6, with confluent anterior osteophytes. The appearance is compatible with diffuse idiopathic skeletal hyperostosis. Cervical cord edema is present associated with cervical cord compression at C6-C7.  There is probable ankylosis across C2-C3 as well.  Small right greater than left atlanto-occipital effusions are present.  Craniocervical junction appears normal. Nonspecific cystic appearing lesion is present in the right thyroid lobe measuring 15 mm.  C2-C3:  Central canal patent.  Left foraminal encroachment associated with facet arthrosis.  C3-C4:  Mild central stenosis associated with broad-based disc osteophyte complex.  Mild right posterior ligamentum flavum redundancy.  Disc osteophyte complex contacts the ventral aspect of the cervical cord  and may flattened it slightly.  Right greater than left foraminal stenosis associated with uncovertebral spurring and facet arthrosis.  C4-C5:  Apparent ankylosis across the disc space.  Central canal and foramina appear patent.  C5-C6:  Facet hypertrophy.  Apparent ankylosis.  Mild central stenosis associated with osseous ridging.  No definite cord deformity.  C6-C7:  Severe central stenosis with compression of the cervical cord. Mild edema is present above and below the levels of compression.  Broad-based disc osteophyte complex and posterior ligamentum  flavum redundancy produce the severe central stenosis. Bilateral foraminal stenosis secondary uncovertebral spurring. Bilateral facet effusions.  C7-T1:  Severe right facet arthrosis with right foraminal stenosis potentially affecting the right C8 nerve.  The foramen appears patent. Minimal central stenosis associated with posterior element hypertrophy and ligamentum flavum redundancy.  IMPRESSION: 1.  Cervical cord compression at C6-C7 with cord edema and / or myelomalacia.  Severe spinal stenosis due to broad-based disc osteophyte complex and posterior ligamentum flavum redundancy. 2.  Diffuse idiopathic skeletal hyperostosis with ankylosis from C4- C6, probably extending to C2 and C3 as well. 3.  Mild C3-C4 central stenosis secondary to disc osteophyte complex.  Right greater than left foraminal stenosis associated uncovertebral spurring.  MRI THORACIC SPINE WITHOUT AND WITH CONTRAST  Technique:  Multiplanar and multiecho pulse sequences of the thoracic spine were obtained without and with intravenous contrast.  Contrast: 15 MULTIHANCE GADOBENATE DIMEGLUMINE 529 MG/ML IV SOLN  Findings:  Mild dextroconvex curvature of the thoracic spine is present.  Remote compression fractures T7 and T8, with approximately 25% loss of vertebral body height.  No marrow edema. The thoracic cord demonstrates normal caliber and signal. Paraspinal soft tissues are within normal limits.  Scattered vertebral body hemangiomata are present.  High signal is present within the discs at T6-T7 and T7-T8 with post-gadolinium enhancement.  There is no endplate edema, erosion or enhancement and this is compatible with degenerative enhancement of the discs.  This may also be due to ossification of the discs with development of marrow space.  Thoracic spine DISH is present.  Tiny central disc protrusion is present at T7-T8, just contacting the ventral aspect of the thoracic cord.  Per CMS PQRS reporting requirements (PQRS Measure 24): Given the  patient's age of greater than 50 and the fracture site (hip, distal radius, or spine), the patient should be tested for osteoporosis using DXA, and the appropriate treatment considered based on the DXA results.  IMPRESSION: No thoracic cord compression.  Thoracic spine DISH and chronic compression fractures of T7 and T8 (25% loss of height) without retropulsion. Original Report Authenticated By: Andreas Newport, M.D.   Mr Thoracic Spine W Wo Contrast  01/09/2011  **ADDENDUM** CREATED: 01/09/2011 13:34:23  Critical Value/emergent results were called by telephone at the time of interpretation on 01/09/2011  at 1136 hours  to  Dr. Jerral Ralph, who verbally acknowledged these results.  **END ADDENDUM** SIGNED BY: Andreas Newport, M.D.   01/09/2011  *RADIOLOGY REPORT*  Clinical Data:  Leg weakness.  Lower extremity weakness and inability to ambulate.  Spinal stenosis.  MRI CERVICAL SPINE WITHOUT AND WITH CONTRAST  Technique:  Multiplanar and multiecho pulse sequences of the cervic al spine, to include the craniocervical junction and cervicothoraci c junction, were obtained according to standard protocol without an d with intravenous contrast.  Contrast: 15 ml Multihance  Comparison: None.  Findings:  Exaggeration of the normal cervical lordosis.  There appears to be ankylosis extending from C4-C6, with confluent anterior osteophytes. The appearance  is compatible with diffuse idiopathic skeletal hyperostosis. Cervical cord edema is present associated with cervical cord compression at C6-C7.  There is probable ankylosis across C2-C3 as well.  Small right greater than left atlanto-occipital effusions are present.  Craniocervical junction appears normal. Nonspecific cystic appearing lesion is present in the right thyroid lobe measuring 15 mm.  C2-C3:  Central canal patent.  Left foraminal encroachment associated with facet arthrosis.  C3-C4:  Mild central stenosis associated with broad-based disc osteophyte complex.  Mild  right posterior ligamentum flavum redundancy.  Disc osteophyte complex contacts the ventral aspect of the cervical cord and may flattened it slightly.  Right greater than left foraminal stenosis associated with uncovertebral spurring and facet arthrosis.  C4-C5:  Apparent ankylosis across the disc space.  Central canal and foramina appear patent.  C5-C6:  Facet hypertrophy.  Apparent ankylosis.  Mild central stenosis associated with osseous ridging.  No definite cord deformity.  C6-C7:  Severe central stenosis with compression of the cervical cord. Mild edema is present above and below the levels of compression.  Broad-based disc osteophyte complex and posterior ligamentum flavum redundancy produce the severe central stenosis. Bilateral foraminal stenosis secondary uncovertebral spurring. Bilateral facet effusions.  C7-T1:  Severe right facet arthrosis with right foraminal stenosis potentially affecting the right C8 nerve.  The foramen appears patent. Minimal central stenosis associated with posterior element hypertrophy and ligamentum flavum redundancy.  IMPRESSION: 1.  Cervical cord compression at C6-C7 with cord edema and / or myelomalacia.  Severe spinal stenosis due to broad-based disc osteophyte complex and posterior ligamentum flavum redundancy. 2.  Diffuse idiopathic skeletal hyperostosis with ankylosis from C4- C6, probably extending to C2 and C3 as well. 3.  Mild C3-C4 central stenosis secondary to disc osteophyte complex.  Right greater than left foraminal stenosis associated uncovertebral spurring.  MRI THORACIC SPINE WITHOUT AND WITH CONTRAST  Technique:  Multiplanar and multiecho pulse sequences of the thoracic spine were obtained without and with intravenous contrast.  Contrast: 15 MULTIHANCE GADOBENATE DIMEGLUMINE 529 MG/ML IV SOLN  Findings:  Mild dextroconvex curvature of the thoracic spine is present.  Remote compression fractures T7 and T8, with approximately 25% loss of vertebral body height.   No marrow edema. The thoracic cord demonstrates normal caliber and signal. Paraspinal soft tissues are within normal limits.  Scattered vertebral body hemangiomata are present.  High signal is present within the discs at T6-T7 and T7-T8 with post-gadolinium enhancement.  There is no endplate edema, erosion or enhancement and this is compatible with degenerative enhancement of the discs.  This may also be due to ossification of the discs with development of marrow space.  Thoracic spine DISH is present.  Tiny central disc protrusion is present at T7-T8, just contacting the ventral aspect of the thoracic cord.  Per CMS PQRS reporting requirements (PQRS Measure 24): Given the patient's age of greater than 50 and the fracture site (hip, distal radius, or spine), the patient should be tested for osteoporosis using DXA, and the appropriate treatment considered based on the DXA results.  IMPRESSION: No thoracic cord compression.  Thoracic spine DISH and chronic compression fractures of T7 and T8 (25% loss of height) without retropulsion. Original Report Authenticated By: Andreas Newport, M.D.   Mr Lumbar Spine W Wo Contrast  01/08/2011  *RADIOLOGY REPORT*  Clinical Data: Bilateral lower extremity weakness.  History of prior lumbar spine surgery.  Progressive worsening weakness in the lower extremities.  MRI LUMBAR SPINE WITHOUT AND WITH CONTRAST  Technique:  Multiplanar and  multiecho pulse sequences of the lumbar spine were obtained without and with intravenous contrast.  Contrast:  17 ml Multihance.  Comparison: MRI lumbar spine 09/14/2010.  Findings: Numbering convention used on prior exam preserved. Vertebral body height and marrow signal is within normal limits. Paraspinal soft tissues are within normal limits.  Distention of the urinary bladder is present.  No destructive osseous lesions. Postoperative changes of L4 laminectomy.  The small fluid collection in the operative bed seen on the recent comparison has  resolved.  T12-L1:  Negative.  L1-L2:  Shallow posterior disc bulging without stenosis.  Mild facet hypertrophy and ligamentum flavum redundancy.  L2-L3:  Mild facet hypertrophy.  No stenosis.  L3-L4:  Bilateral facet hypertrophy and ligamentum flavum redundancy with mild central stenosis.  Unchanged mild symmetric bilateral foraminal stenosis.  L4-L5:  Laminectomy with wide posterior decompression.  Central canal and lateral recesses are patent.  The enhancing granulation tissue is present in the operative bed and the epidural space.  No epidural abscess or fluid collection.  Short pedicles are present with mild left foraminal stenosis.  Facet hypertrophy is present.  L5-S1:  Disc desiccation and degeneration.  Degenerative endplate changes are present.  Central canal and lateral recesses are patent.  Mild left foraminal stenosis associated with endplate spurring and facet hypertrophy.  The left greater than right facet degeneration/hypertrophy.  IMPRESSION:  1.  Evolving postoperative changes of L4 laminectomy with good decompression of the thecal sac and lateral recesses.  Mild left foraminal stenosis associated with short pedicles and facet hypertrophy potentially affecting the left L4 nerve. Resolution of small fluid collection in the operative bed.  Expected enhancing granulation tissue in the operative bed. 2.  Unchanged L5-S1 degenerative disease with mild left foraminal stenosis secondary endplate spurring and facet hypertrophy. 3.  Unchanged L3-L4 facet arthrosis with mild central stenosis.  Original Report Authenticated By: Andreas Newport, M.D.    Medications: I have reviewed the patient's current medications. Scheduled Meds:    . aspirin EC  81 mg Oral Daily  . bacitracin      . ceFAZolin (ANCEF) IV  1 g Intravenous 60 min Pre-Op  . ceFAZolin (ANCEF) IV  1 g Intravenous Once  . ceFAZolin (ANCEF) IV  1 g Intravenous Q8H  . citalopram  10 mg Oral Daily  . dexamethasone  8 mg Intravenous QID    . dicyclomine  10 mg Oral TID AC & HS  . docusate sodium  100 mg Oral BID  . fentaNYL  50 mcg Intravenous Once  . loratadine  10 mg Oral Daily  . LORazepam  1 mg Intravenous Once  . metoprolol succinate  12.5 mg Oral BID  . pantoprazole  40 mg Oral Daily  . polyethylene glycol  17 g Oral BID  . simethicone  80 mg Oral QID  . simvastatin  20 mg Oral q1800  . sodium chloride      . sodium chloride  3 mL Intravenous Q12H  . Tamsulosin HCl  0.4 mg Oral BID   Continuous Infusions:    . sodium chloride    . lactated ringers 75 mL/hr at 01/16/11 1300   PRN Meds:.acetaminophen, acetaminophen, acetaminophen, diazepam, gi cocktail, menthol-cetylpyridinium, midazolam, morphine, ondansetron (ZOFRAN) IV, ondansetron (ZOFRAN) IV, ondansetron, oxyCODONE, phenol, promethazine, sodium chloride, zolpidem, zolpidem, DISCONTD: bacitracin, DISCONTD: bupivacaine-EPINEPHrine, DISCONTD: hemostatic agents, DISCONTD: thrombin Assessment/Plan: Principal Problem:  *Cord compression  Assessment: Severe cervical cord compression at C6-C7 with cord edema and/or myelomalacia. Severe spinal stenosis due to broad-based disc osteophyte  complex and posterior ligamentum flavum redundancy.  Plan: C6-7 anterior cervical discectomy fusion and plating planned for tomorrow by neurosurgery.   Hematuria The patient has had persistent hematuria. Urine cultures are pending. I have consulted urology to see the patient. We'll continue to monitor his hemoglobin. We'll hold his aspirin at this time  Active Problems:  HTN (hypertension)  Assessment: Controlled.  Plan: Continue metoprolol.  Dyslipidemia  Assessment: Stable  Plan: Continue with Zocor  BPH (benign prostatic hyperplasia)  Assessment: An ongoing issue, but no difficulty urinating.  Plan: Continue with Flomax. Has Foley in place now.Will need voiding trail when foley discontinued.  GERD (gastroesophageal reflux disease)  Assessment: Stable  Plan: Continue with  Protonix.  Lumbar stenosis with neurogenic claudication  Assessment: Stable, this is not the cause of patient's symptoms. MRI of the lumbar spine did not show significant pathology in this area.  Plan: monitor.  Irritable bowel syndrome (IBS)  Assessment: Complains of abdominal pain today.  Plan: Continue simethicone, MiraLAX, Bentyl.         LOS: 8 days

## 2011-01-16 NOTE — Consult Note (Signed)
Urology Consult  CC: Blood in urine  HPI: 77 year old male with a history of BPH, and recurrent urinary retention over the past several months. Unfortunately, he has had several operations, including spinal procedures as well as a cholecystectomy. I have seen him on several occasions in the office, for retention. Prior to his recent hospitalization, he was voiding adequately on alpha blockers and bethanechol to improve bladder contractility. He was admitted about a week ago for weakness, and underwent a cervical procedure today by Dr. Lovell Sheehan. Yesterday, with the catheter in, the patient noted blood in his urine. This included some clots. He has no discomfort from the catheter. Prior to yesterday, he denies any recent gross hematuria.  The patient had been on in and out catheterization up until recently. Even with catheterization at that time, he denied any gross hematuria. He does have a history of a very large prostate. He has this recurrent postoperative retention.  PMH: Past Medical History  Diagnosis Date  . Hypertension   . GERD (gastroesophageal reflux disease)   . High cholesterol   . Enlarged prostate   . High cholesterol     PSH: Past Surgical History  Procedure Date  . Joint replacement 2001  and 2002    both knees  . Laminectomy 08/2010  . Back surgery   . Replacement total knee   . Cholecystectomy 11/01/10    Allergies: Allergies  Allergen Reactions  . Vicodin (Hydrocodone-Acetaminophen) Hives    On neck and chest.    Medications: Prescriptions prior to admission  Medication Sig Dispense Refill  . aspirin 81 MG EC tablet Take 81 mg by mouth daily. Stop taking before surgery      . bethanechol (URECHOLINE) 25 MG tablet Take 25 mg by mouth 2 (two) times daily.        . Cetirizine HCl (ZYRTEC PO) Take by mouth daily.       . citalopram (CELEXA) 10 MG tablet Take 10 mg by mouth daily.        Marland Kitchen dicyclomine (BENTYL) 10 MG capsule Take 10 mg by mouth 4 (four) times  daily -  before meals and at bedtime.        . lovastatin (MEVACOR) 40 MG tablet Take 40 mg by mouth at bedtime.        . metoprolol succinate (TOPROL-XL) 25 MG 24 hr tablet Take 12.5 mg by mouth 2 (two) times daily.        Marland Kitchen NEXIUM 40 MG capsule Take 40 mg by mouth daily before breakfast.       . oxyCODONE (OXY IR/ROXICODONE) 5 MG immediate release tablet Take 5 mg by mouth every 4 (four) hours as needed.        . Tamsulosin HCl (FLOMAX) 0.4 MG CAPS Take 0.4 mg by mouth 2 (two) times daily.          Social History: History   Social History  . Marital Status: Married    Spouse Name: N/A    Number of Children: N/A  . Years of Education: N/A   Occupational History  . Not on file.   Social History Main Topics  . Smoking status: Former Games developer  . Smokeless tobacco: Not on file  . Alcohol Use: No  . Drug Use: No  . Sexually Active: No   Other Topics Concern  . Not on file   Social History Narrative  . No narrative on file    Family History: History reviewed. No pertinent family history.  Review of Systems: Positive: Recent lower extremity weakness Negative:  A further 10 point review of systems was negative except what is listed in the HPI.  Physical Exam: @VITALS2 @ General: No acute distress.  Awake. Cervical collar was in place Head:  Normocephalic.  Atraumatic. ENT:  EOMI.  Mucous membranes moist Neck:  Not examined. CV:  S1 present. S2 present. Regular rate. Pulmonary: Equal effort bilaterally.    Abdomen: Not examined Skin:  Normal turgor.  No visible rash. Extremity: No gross deformity of bilateral upper extremities.  No gross deformity of    bilateral lower extremities. Neurologic: Alert. Appropriate mood.  Penis:  circumcised.  No lesions. Urethra:  Foley catheter in place.  Orthotopic meatus. Scrotum: No lesions.  No ecchymosis.  No erythema. Testicles:  palpation.  Studies:  Recent Labs  Basename 01/16/11 1650 01/14/11 0800   HGB 14.9 16.3   WBC  17.9* 9.2   PLT 173 176    Recent Labs  Basename 01/15/11 0540 01/14/11 0800   NA -- 137   K -- 4.0   CL -- 99   CO2 -- 27   BUN -- 29*   CREATININE 1.06 1.03   CALCIUM -- 9.0   GFRNONAA 67* 69*   GFRAA 77* 80*     No results found for this basename: PT:2,INR:2,APTT:2 in the last 72 hours   No components found with this basename: ABG:2  Urine was grossly bloody  Assessment: 1. BPH, significant size gland. The patient is currently managed with a Foley catheter postoperatively. Preoperatively, he was on both tamsulosin and bethanechol. He continues on these.  2. Gross hematuria. More than likely, this is secondary to catheter trauma. I do not think it is displaced, I just think that he more than likely has irritation from the catheter. Currently, the urine is draining appropriately and does not have clots  Plan: 1. I reassured the patient and his family about this. Hopefully, this will clear in the near future  2. I have ordered catheter irrigations every 6 hours, in and out with 70 cc of saline. This may help get some small clots out, and allow the minor bleeding to stop.  3. I will keep in on him. If he still has some significant hematuria despite irrigation, I think it worthwhile to replace a larger catheter.    Pager:[phone]

## 2011-01-16 NOTE — Anesthesia Procedure Notes (Signed)
Procedure Name: Intubation Date/Time: 01/16/2011 7:30 AM Performed by: Elizbeth Squires Pre-anesthesia Checklist: Patient identified, Emergency Drugs available, Suction available and Patient being monitored Patient Re-evaluated:Patient Re-evaluated prior to inductionOxygen Delivery Method: Circle System Utilized Preoxygenation: Pre-oxygenation with 100% oxygen Intubation Type: IV induction Ventilation: Mask ventilation without difficulty and Oral airway inserted - appropriate to patient size Grade View: Grade I Tube type: Oral Tube size: 8.0 mm Number of attempts: 1 Airway Equipment and Method: stylet and video-laryngoscopy (Elective glidescope for cervical stability. CSpine stablity maintained throughout procedure.) Placement Confirmation: ETT inserted through vocal cords under direct vision,  breath sounds checked- equal and bilateral and positive ETCO2 Secured at: 19 cm Tube secured with: Tape Dental Injury: Teeth and Oropharynx as per pre-operative assessment

## 2011-01-16 NOTE — Progress Notes (Signed)
01/16/11 0600am Pt continues to have blood in foley bag. Physician notified. Will continue to monitor.

## 2011-01-16 NOTE — Anesthesia Postprocedure Evaluation (Signed)
  Anesthesia Post-op Note  Patient: Ricky Bradley  Procedure(s) Performed:  ANTERIOR CERVICAL DECOMPRESSION/DISCECTOMY FUSION 1 LEVEL - Cervical six-seven  Anterior Cervical Decomp[ression Fusion  Patient Location: PACU  Anesthesia Type: General  Level of Consciousness: awake  Airway and Oxygen Therapy: Patient Spontanous Breathing  Post-op Pain: mild  Post-op Assessment: Post-op Vital signs reviewed  Post-op Vital Signs: stable  Complications: No apparent anesthesia complications

## 2011-01-16 NOTE — Progress Notes (Signed)
Subjective:  The patient is alert and pleasant. He has no complaints.  Objective: Vital signs in last 24 hours: Temp:  [97.2 F (36.2 C)-98 F (36.7 C)] 97.2 F (36.2 C) (12/03 1030) Pulse Rate:  [64-75] 64  (12/03 1249) Resp:  [18-42] 18  (12/03 1030) BP: (133-142)/(69-79) 140/77 mmHg (12/03 1030) SpO2:  [95 %-98 %] 95 % (12/03 1030)  Intake/Output from previous day: 12/02 0701 - 12/03 0700 In: -  Out: 5650 [Urine:5650] Intake/Output this shift: Total I/O In: 1450 [I.V.:1200; IV Piggyback:250] Out: 100 [Urine:75; Blood:25]  Physical exam the patient is alert and oriented. His dressing is clean and dry. There is no evidence of hematoma or shift. He is moving all 4 extremities well.  Lab Results:  Basename 01/16/11 1650 01/14/11 0800  WBC 17.9* 9.2  HGB 14.9 16.3  HCT 40.9 45.6  PLT 173 176   BMET  Basename 01/15/11 0540 01/14/11 0800  NA -- 137  K -- 4.0  CL -- 99  CO2 -- 27  GLUCOSE -- 152*  BUN -- 29*  CREATININE 1.06 1.03  CALCIUM -- 9.0    Studies/Results: Dg Cervical Spine 2-3 Views  01/16/2011  *RADIOLOGY REPORT*  Clinical Data: Anterior fusion at C6-7  CERVICAL SPINE - 2-3 VIEW  Comparison: MR c-spine of 01/09/2011  Findings: A cross-table lateral portable view shows an instrument for localization at the C6-7 level.  Considerable osteophyte formation is noted anteriorly from C2-C5 with apparent fusion at C5- 6.  A second portable films shows placement of anterior fixation plate at the Z6-1 level with no change in alignment.  IMPRESSION: Anterior fusion at C6-7.  Normal alignment.  Diffuse degenerative change.  Original Report Authenticated By: Juline Patch, M.D.    Assessment/Plan: The patient is doing well.  LOS: 8 days     Ricky Bradley D 01/16/2011, 6:39 PM

## 2011-01-16 NOTE — Progress Notes (Signed)
Patient ID: Ricky Bradley, male   DOB: 1933/04/15, 77 y.o.   MRN: [id number] Subjective:  The patient is alert and pleasant. He has no complaints and is in no apparent distress.  Objective: Vital signs in last 24 hours: Temp:  [97.2 F (36.2 C)-98 F (36.7 C)] 97.9 F (36.6 C) (12/03 0930) Pulse Rate:  [68-72] 68  (12/03 0618) Resp:  [18] 18  (12/03 0618) BP: (133-147)/(69-82) 138/69 mmHg (12/03 0938) SpO2:  [96 %-98 %] 98 % (12/03 0618)  Intake/Output from previous day: 12/02 0701 - 12/03 0700 In: -  Out: 5650 [Urine:5650] Intake/Output this shift: Total I/O In: 1450 [I.V.:1200; IV Piggyback:250] Out: 100 [Urine:75; Blood:25]  Physical exam the patient is alert. Glasgow Coma Scale 13. He is moving all 4 extremities well.  Patient's dressing is clean and dry. There is no evidence of hematoma or shift.  Lab Results:  Basename 01/14/11 0800  WBC 9.2  HGB 16.3  HCT 45.6  PLT 176   BMET  Basename 01/15/11 0540 01/14/11 0800  NA -- 137  K -- 4.0  CL -- 99  CO2 -- 27  GLUCOSE -- 152*  BUN -- 29*  CREATININE 1.06 1.03  CALCIUM -- 9.0    Studies/Results: No results found.  Assessment/Plan: The patient is doing well.  LOS: 8 days     Takaya Hyslop D 01/16/2011, 9:50 AM

## 2011-01-16 NOTE — Op Note (Signed)
Brief history: The patient is a 77 year old white male who presented with inability to ambulate. He was admitted and worked up with a cervical MRI which demonstrated severe stenosis and spinal cord signal change at C6-7. I discussed the various treatment options with the patient and his daughter. I recommend he undergo a C6-7 intracervical discectomy fusion plating. I told them he would also likely a posterior cervical decompression and fusion as well. I explained the surgery to them as well as the risks benefits and alternatives. I have answered all the patient's questions. He wants to proceed with surgery.  Preoperative diagnosis: C6-7 spondylosis, stenosis, cervical myelopathy, cervicalgia.  Postoperative diagnosis: The same  Procedure: C6-7 Anterior cervical discectomy/decompression; C6-7 interbody arthrodesis with local morcellized autograft bone and Actifuse bone graft extender; insertion of interbody prosthesis at C6-7 (Zimmer peek interbody prosthesis); anterior cervical plating from C6-C7 with globus titanium plate  Surgeon: Dr. Delma Officer  Asst.: Dr. Barbaraann Barthel  Anesthesia: Gen. endotracheal  Estimated blood loss: 50 cc  Drains: None  Complications: None  Description of procedure: The patient was brought to the operating room by the anesthesia team. General endotracheal anesthesia was induced. A roll was placed under the patient's shoulders to keep the neck in the neutral position. The patient's anterior cervical region was then prepared with Betadine scrub and Betadine solution. Sterile drapes were applied.  The area to be incised was then injected with Marcaine with epinephrine solution. I then used a scalpel to make a transverse incision in the patient's left anterior neck. I used the Metzenbaum scissors to divide the platysmal muscle and then to dissect medial to the sternocleidomastoid muscle, jugular vein, and carotid artery. I carefully dissected down towards the anterior  cervical spine identifying the esophagus and retracting it medially. Then using Kitner swabs to clear soft tissue from the anterior cervical spine. We noted that the patient had severe ventral spondylosis. We then inserted a bent spinal needle into theexposed intervertebral disc space. We then obtained intraoperative radiographs confirm our location.  I then used electrocautery to detach the medial border of the longus colli muscle bilaterally from the C6-7 intervertebral disc spaces. I then inserted the Caspar self-retaining retractor underneath the longus colli muscle bilaterally to provide exposure.  In order to gain access to the intervertebral disc space we had to drill away some of the ventral spondylosis. We then incised the intervertebral disc at C6-7. We then performed a partial intervertebral discectomy with a pituitary forceps and the Karlin curettes. I then inserted distraction screws into the vertebral bodies at C6 and C7. We then distracted the interspace. We then used the high-speed drill to decorticate the vertebral endplates at C6-7, to drill away the remainder of the intervertebral disc, to drill away some posterior spondylosis, and to thin out the posterior longitudinal ligament. I then incised ligament with the arachnoid knife. We then removed the ligament with a Kerrison punches undercutting the vertebral endplates and decompressing the thecal sac. We then performed foraminotomies about the bilateral C7 nerve roots. This completed the decompression at this level.   We now turned our to attention to the interbody fusion. We used the trial spacers to determine the appropriate size for the interbody prosthesis. We then pre-filled prosthesis with a combination of local morcellized autograft bone that we obtained during decompression as well as Actifuse bone graft extender. We then inserted the prosthesis into the distracted interspace at C6-7. We then removed the distraction screws. There was  a good snug fit of  the prosthesis in the interspace.    Having completed the fusion we now turned attention to the anterior spinal instrumentation. We used the high-speed drill to drill away some anterior spondylosis at the disc spaces so that the plate lay down flat. We selected the appropriate length titanium anterior cervical plate. We laid it along the anterior aspect of the vertebral bodies from C6-C7. We then drilled 14 mm holes at C6 and C7. We then secured the plate to the vertebral bodies by placing two 14 mm self-tapping screws at C6 and C7. We then obtained intraoperative radiograph. The demonstrating good position of the instrumentation. We therefore secured the screws the plate the locking each cam. This completed the instrumentation.  We then obtained hemostasis using bipolar electrocautery. We irrigated the wound out with bacitracin solution. We then removed the retractor. We inspected the esophagus for any damage. There was none apparent. We then reapproximated patient's platysmal muscle with interrupted 3-0 Vicryl suture. We then reapproximated the subcutaneous tissue with interrupted 3-0 Vicryl suture. The skin was reapproximated with Steri-Strips and benzoin. The wound was then covered with bacitracin ointment. A sterile dressing was applied. The drapes were removed. Patient was subsequently extubated by the anesthesia team and transported to the post anesthesia care unit in stable condition. All sponge instrument and needle counts were correct at the end of this case.

## 2011-01-16 NOTE — Progress Notes (Signed)
Surgery completed 12/3 .Will follow up after PT eval completed.

## 2011-01-16 NOTE — Transfer of Care (Signed)
Immediate Anesthesia Transfer of Care Note  Patient: Ricky Bradley  Procedure(s) Performed:  ANTERIOR CERVICAL DECOMPRESSION/DISCECTOMY FUSION 1 LEVEL - Cervical six-seven  Anterior Cervical Decomp[ression Fusion  Patient Location: PACU  Anesthesia Type: General  Level of Consciousness: awake  Airway & Oxygen Therapy: Patient Spontanous Breathing and Patient connected to nasal cannula oxygen  Post-op Assessment: Report given to PACU RN, Post -op Vital signs reviewed and stable, Patient moving all extremities and Patient able to stick tongue midline  Post vital signs: Reviewed and stable  Complications: No apparent anesthesia complications

## 2011-01-16 NOTE — Preoperative (Signed)
Beta Blockers   Reason not to administer Beta Blockers:Not Applicable 

## 2011-01-16 NOTE — Progress Notes (Signed)
Patient ID: Ricky Bradley, male   DOB: [birth year], 77 y.o.   MRN: [id number] Subjective:  The patient is alert and pleasant. He has no new complaints.  Objective: Vital signs in last 24 hours: Temp:  [97.2 F (36.2 C)-98 F (36.7 C)] 98 F (36.7 C) (12/03 0618) Pulse Rate:  [68-72] 68  (12/03 0618) Resp:  [18] 18  (12/03 0618) BP: (133-147)/(74-82) 133/74 mmHg (12/03 0618) SpO2:  [96 %-98 %] 98 % (12/03 0618)  Intake/Output from previous day: 12/02 0701 - 12/03 0700 In: -  Out: 5650 [Urine:5650] Intake/Output this shift:    Physical exam  The patient is alert and oriented. He is moving all 4 extremities well.  Lab Results:  Basename 01/14/11 0800  WBC 9.2  HGB 16.3  HCT 45.6  PLT 176   BMET  Basename 01/15/11 0540 01/14/11 0800  NA -- 137  K -- 4.0  CL -- 99  CO2 -- 27  GLUCOSE -- 152*  BUN -- 29*  CREATININE 1.06 1.03  CALCIUM -- 9.0    Studies/Results: No results found.  Assessment/Plan: C6-7 spondylosis, stenosis, cervical myelopathy, cervicalgia: I have answeredall the patient's questions. He wants to proceed with surgery.  LOS: 8 days     Shanitha Twining D 01/16/2011, 7:16 AM

## 2011-01-17 DIAGNOSIS — M4712 Other spondylosis with myelopathy, cervical region: Secondary | ICD-10-CM

## 2011-01-17 LAB — GLUCOSE, CAPILLARY: Glucose-Capillary: 200 mg/dL — ABNORMAL HIGH (ref 70–99)

## 2011-01-17 MED ORDER — METOPROLOL SUCCINATE 12.5 MG HALF TABLET
12.5000 mg | ORAL_TABLET | Freq: Two times a day (BID) | ORAL | Status: DC
  Filled 2011-01-17 (×2): qty 1

## 2011-01-17 MED ORDER — CIPROFLOXACIN HCL 500 MG PO TABS
500.0000 mg | ORAL_TABLET | Freq: Two times a day (BID) | ORAL | Status: DC
  Administered 2011-01-17 – 2011-01-20 (×6): 500 mg via ORAL
  Filled 2011-01-17 (×8): qty 1

## 2011-01-17 NOTE — Progress Notes (Signed)
Utilization review complete 

## 2011-01-17 NOTE — Progress Notes (Signed)
Clinical social worker received referral for skilled nursing placement. CSW reviewed chart and is aware of CIR consult as well. CSW awaiting physical therapy evaluation to determine pt needs. CSW continuing to follow to assist with pt dc plans.   Catha Gosselin, Theresia Majors  [phone] .01/17/2011 11:53am

## 2011-01-17 NOTE — Consult Note (Signed)
Physical Medicine and Rehabilitation Consult Reason for Consult:Cervical Myelopathy Referring Phsyician: DR. Dionne Ano is an 77 y.o. male.   HPI: 77 year old male admitted November 25 with bilateral lower  weakness  progressive in nature. Patient with similar episodes in May 2012 when he was seen by neurosurgery and underwent lumbar laminectomy for spinal stenosis. MRI of cervical spine demonstrated severe stenosis at cervical C6 and C7 with myelopathy. Patient underwent cervical C6-7 anterior cervical discectomy with decompression on December 3 per Dr. Tressie Stalker of neurosurgery. Postoperatively with urinary retention and noted history of benign prostatic hypertrophy. He was seen in consultation by urology services Dr. Retta Diones with Foley catheter inserted until mobility improved and then plan voiding trial. Physical and occupational therapies remained pending. Physical medicine and rehabilitation were consulted for consideration of inpatient rehabilitation services.  Review of Systems  Constitutional: Positive for malaise/fatigue.  HENT: Positive for neck pain.   Eyes: Negative for double vision.  Respiratory: Negative for cough and shortness of breath.   Cardiovascular: Negative for chest pain.  Gastrointestinal: Positive for constipation. Negative for nausea.  Genitourinary:       Urinary retention  Skin: Negative for itching.  Neurological: Negative for dizziness, seizures and headaches.  Psychiatric/Behavioral: Positive for depression.   Past Medical History  Diagnosis Date  . Hypertension   . GERD (gastroesophageal reflux disease)   . High cholesterol   . Enlarged prostate   . High cholesterol    Past Surgical History  Procedure Date  . Joint replacement 2001  and 2002    both knees  . Laminectomy 08/2010  . Back surgery   . Replacement total knee   . Cholecystectomy 11/01/10   History reviewed. No pertinent family history. Social History:  reports that he  has quit smoking. He does not have any smokeless tobacco history on file. He reports that he does not drink alcohol or use illicit drugs. Allergies:  Allergies  Allergen Reactions  . Vicodin (Hydrocodone-Acetaminophen) Hives    On neck and chest.   Medications Prior to Admission  Medication Dose Route Frequency Provider Last Rate Last Dose  . 0.9 %  sodium chloride infusion  250 mL Intravenous Continuous Cristi Loron      . acetaminophen (TYLENOL) tablet 650 mg  650 mg Oral Q6H PRN Skip Estimable       Or  . acetaminophen (TYLENOL) suppository 650 mg  650 mg Rectal Q6H PRN Skip Estimable      . acetaminophen (TYLENOL) suppository 650 mg  650 mg Rectal Q4H PRN Cristi Loron      . aspirin EC tablet 81 mg  81 mg Oral Daily Eugene Garnet, PHARMD   81 mg at 01/16/11 1251  . bacitracin 16109 UNITS injection           . ceFAZolin (ANCEF) IVPB 1 g/50 mL premix  1 g Intravenous 60 min Pre-Op Duane Lope Jenkins   1 g at 01/16/11 6045  . ceFAZolin (ANCEF) IVPB 1 g/50 mL premix  1 g Intravenous Q8H Duane Lope Jenkins   1 g at 01/16/11 2250  . citalopram (CELEXA) tablet 10 mg  10 mg Oral Daily Jacqulyn Ducking. [place]   10 mg at 01/15/11 1007  . dexamethasone (DECADRON) injection 8 mg  8 mg Intravenous QID Stasia Cavalier, RPH   8 mg at 01/16/11 2250  . diazepam (VALIUM) tablet 5 mg  5 mg Oral Q6H PRN Cristi Loron   5 mg at  01/16/11 1729  . dicyclomine (BENTYL) capsule 10 mg  10 mg Oral TID AC & HS Jacqulyn Ducking. Cammarata   10 mg at 01/16/11 2251  . docusate sodium (COLACE) capsule 100 mg  100 mg Oral BID Cristi Loron   100 mg at 01/16/11 2251  . enoxaparin (LOVENOX) injection 40 mg  40 mg Subcutaneous Q24H Cristi Loron   40 mg at 01/14/11 2202  . fentaNYL (SUBLIMAZE) injection 50 mcg  50 mcg Intravenous Once Judie Petit, MD      . gadobenate dimeglumine (MULTIHANCE) injection 15 mL  15 mL Intravenous Once Medication Radiologist   15 mL at 01/09/11 1156  .  gadobenate dimeglumine (MULTIHANCE) injection 17 mL  17 mL Intravenous Once PRN Medication Radiologist   17 mL at 01/08/11 1722  . gi cocktail  30 mL Oral BID PRN Christina Rama   30 mL at 01/14/11 1806  . lactated ringers infusion   Intravenous Continuous Cristi Loron 75 mL/hr at 01/16/11 1300    . loratadine (CLARITIN) tablet 10 mg  10 mg Oral Once Rolan Lipa   10 mg at 01/14/11 2327  . loratadine (CLARITIN) tablet 10 mg  10 mg Oral Daily Michelle A. Matthews   10 mg at 01/16/11 1253  . LORazepam (ATIVAN) 2 MG/ML injection           . LORazepam (ATIVAN) injection 1 mg  1 mg Intravenous Once Skip Estimable      . LORazepam (ATIVAN) injection 1 mg  1 mg Intravenous Once Shanker Ghimire   1 mg at 01/09/11 0918  . LORazepam (ATIVAN) injection 1 mg  1 mg Intravenous Once Shanker Ghimire   1 mg at 01/09/11 0948  . LORazepam (ATIVAN) injection 2 mg  2 mg Intravenous Once Thomasene Lot, PA   1 mg at 01/08/11 1427  . menthol-cetylpyridinium (CEPACOL) lozenge 3 mg  1 lozenge Oral PRN Cristi Loron       Or  . phenol (CHLORASEPTIC) mouth spray 1 spray  1 spray Mouth/Throat PRN Cristi Loron      . metoprolol succinate (TOPROL-XL) 24 hr tablet 12.5 mg  12.5 mg Oral BID Christina Rama   12.5 mg at 01/16/11 2316  . metoprolol succinate (TOPROL-XL) 24 hr tablet 12.5 mg  12.5 mg Oral BID Cristi Loron      . midazolam (VERSED) injection 0.5-2 mg  0.5-2 mg Intravenous Once PRN Judie Petit, MD      . morphine 4 MG/ML injection 1-4 mg  1-4 mg Intravenous Q3H PRN Cristi Loron   4 mg at 01/16/11 2215  . ondansetron (ZOFRAN) tablet 4 mg  4 mg Oral Q6H PRN Jacqulyn Ducking. Cammarata   4 mg at 01/13/11 1458   Or  . ondansetron (ZOFRAN) injection 4 mg  4 mg Intravenous Q6H PRN Skip Estimable      . ondansetron (ZOFRAN) injection 4 mg  4 mg Intravenous Q4H PRN Cristi Loron      . oxyCODONE (Oxy IR/ROXICODONE) immediate release tablet 5 mg  5 mg Oral Q4H PRN Jacqulyn Ducking. [place]   5 mg at 01/16/11 1729  . pantoprazole (PROTONIX) EC tablet 40 mg  40 mg Oral Daily Jacqulyn Ducking. [place]   40 mg at 01/16/11 1253  . polyethylene glycol (MIRALAX / GLYCOLAX) packet 17 g  17 g Oral BID Shanker Ghimire   17 g at 01/16/11 2252  . polyvinyl alcohol (LIQUIFILM TEARS) 1.4 % ophthalmic solution  1 drop  1 drop Left Eye Q4H PRN Michelle A. Matthews   1 drop at 01/16/11 1628  . promethazine (PHENERGAN) injection 6.25-12.5 mg  6.25-12.5 mg Intravenous Q15 min PRN Judie Petit, MD      . simethicone [facility]) chewable tablet 80 mg  80 mg Oral QID Duane Lope Jenkins   80 mg at 01/16/11 2252  . simvastatin (ZOCOR) tablet 20 mg  20 mg Oral q1800 Jacqulyn Ducking. [place]ata   20 mg at 01/16/11 1728  . sodium chloride 0.9 % bolus 1,000 mL  1,000 mL Intravenous Once Thomasene Lot, PA   1,000 mL at 01/08/11 1156  . sodium chloride 0.9 % infusion           . sodium chloride 0.9 % injection 3 mL  3 mL Intravenous Q12H Cristi Loron   3 mL at 01/16/11 2256  . sodium chloride 0.9 % injection 3 mL  3 mL Intravenous PRN Cristi Loron      . sodium phosphate (FLEET) 7-19 GM/118ML enema 1 enema  1 enema Rectal Once Michelle A. Matthews   1 enema at 01/14/11 1806  . Tamsulosin HCl (FLOMAX) capsule 0.4 mg  0.4 mg Oral BID Jacqulyn Ducking. [place]ammarata   0.4 mg at 01/16/11 2251  . zolpidem (AMBIEN) tablet 5 mg  5 mg Oral QHS PRN Gery Pray, MD   5 mg at 01/15/11 2242  . zolpidem (AMBIEN) tablet 5 mg  5 mg Oral QHS PRN Cristi Loron      . DISCONTD: aspirin EC tablet 81 mg  81 mg Oral Daily Skip Estimable      . DISCONTD: bacitracin ointment    PRN Cristi Loron   1 application at 01/16/11 1610  . DISCONTD: bethanechol (URECHOLINE) tablet 25 mg  25 mg Oral BID Jacqulyn Ducking. Cammarata   25 mg at 01/11/11 1037  . DISCONTD: bupivacaine-EPINEPHrine (MARCAINE W/ EPI) 0.5 % (with pres) injection    PRN Cristi Loron   10 mL at 01/16/11 0749  . DISCONTD: ceFAZolin (ANCEF) IVPB 1 g/50 mL  premix  1 g Intravenous Once Cristi Loron      . DISCONTD: cetirizine (ZYRTEC) chewable tablet 5 mg  5 mg Oral QHS Skip Estimable      . DISCONTD: dexamethasone (DECADRON) injection 6 mg  6 mg Intravenous Q6H Shanker Ghimire      . DISCONTD: dexamethasone (DECADRON) injection 8 mg  8 mg Intravenous Q6H Shanker Ghimire      . DISCONTD: dexamethasone (DECADRON) injection 8 mg  8 mg Intravenous Q6H Shanker Ghimire      . DISCONTD: dexamethasone (DECADRON) injection 8 mg  8 mg Intravenous Q6H Shanker Ghimire   8 mg at 01/11/11 0610  . DISCONTD: hemostatic agents    PRN Cristi Loron   1 application at 01/16/11 9604  . DISCONTD: hydroxypropyl methylcellulose (ISOPTO TEARS) 2.5 % ophthalmic solution 1 drop  1 drop Left Eye QID PRN Michelle A. Ashley Royalty      . DISCONTD: loratadine (CLARITIN) tablet 10 mg  10 mg Oral Daily Eugene Garnet, PHARMD   10 mg at 01/12/11 1055  . DISCONTD: loratadine (CLARITIN) tablet 10 mg  10 mg Oral Daily Christina Rama   10 mg at 01/13/11 1457  . DISCONTD: metoprolol succinate (TOPROL-XL) 24 hr tablet 12.5 mg  12.5 mg Oral Daily Jacqulyn Ducking. Cammarata   12.5 mg at 01/13/11 1137  . DISCONTD: NON FORMULARY   Mouth/Throat 1 day  or 1 dose Cristi Loron      . DISCONTD: thrombin kit 5000 units    PRN Cristi Loron   5,000 Units at 01/16/11 1610   Medications Prior to Admission  Medication Sig Dispense Refill  . aspirin 81 MG EC tablet Take 81 mg by mouth daily. Stop taking before surgery      . bethanechol (URECHOLINE) 25 MG tablet Take 25 mg by mouth 2 (two) times daily.        . Cetirizine HCl (ZYRTEC PO) Take by mouth daily.       Marland Kitchen lovastatin (MEVACOR) 40 MG tablet Take 40 mg by mouth at bedtime.        . metoprolol succinate (TOPROL-XL) 25 MG 24 hr tablet Take 12.5 mg by mouth 2 (two) times daily.        Marland Kitchen NEXIUM 40 MG capsule Take 40 mg by mouth daily before breakfast.       . Tamsulosin HCl (FLOMAX) 0.4 MG CAPS Take 0.4 mg by mouth 2 (two) times  daily.         Home:    Functional History:   Functional Status:  Mobility:          ADL:    Cognition: Cognition Orientation Level: Oriented X4 Cognition Orientation Level: Oriented X4  Blood pressure 155/79, pulse 70, temperature 97.1 F (36.2 C), temperature source Oral, resp. rate 16, height 6' (1.829 m), weight 81.466 kg (179 lb 9.6 oz), SpO2 98.00%. Physical Exam  Constitutional: He is oriented to person, place, and time. He appears well-developed.  HENT:  Head: Normocephalic.  Neck:       Collar in place  Cardiovascular: Normal rate.   Pulmonary/Chest: Breath sounds normal. He has no wheezes.  Abdominal: He exhibits no distension. There is no tenderness.  Musculoskeletal: He exhibits no edema.  Neurological: He is alert and oriented to person, place, and time. A sensory deficit is present. Coordination and gait abnormal.  Reflex Scores:      Tricep reflexes are 1+ on the right side and 1+ on the left side.      Bicep reflexes are 1+ on the right side and 1+ on the left side.      Brachioradialis reflexes are 1+ on the right side and 1+ on the left side.      Patellar reflexes are 1+ on the right side and 1+ on the left side.      Achilles reflexes are 1+ on the right side and 1+ on the left side.      Patient with 4/5 strength in both upper extremities grossly. He had some diminishment of his fine touch in the hands right more than left. In the lower extremities strength was 3/5 proximally at the hip flexors 3+ at the knee extensors and flexors and 3+ to 4/5 at the ankle dorsiflexors and plantar flexors. He had some diminishment of his leg sensation at 1 plus out of 2 below the waist on either side.  Fine motor coordination of the upper extremities and lower extremities was fair.  Skin: Skin is warm and dry.  Psychiatric: He has a normal mood and affect.    Results for orders placed during the hospital encounter of 01/08/11 (from the past 24 hour(s))  CBC      Status: Abnormal   Collection Time   01/16/11  4:50 PM      Component Value Range   WBC 17.9 (*) 4.0 - 10.5 (K/uL)  RBC 5.07  4.22 - 5.81 (MIL/uL)   Hemoglobin 14.9  13.0 - 17.0 (g/dL)   HCT 69.6  29.5 - 28.4 (%)   MCV 80.7  78.0 - 100.0 (fL)   MCH 29.4  26.0 - 34.0 (pg)   MCHC 36.4 (*) 30.0 - 36.0 (g/dL)   RDW 13.2  44.0 - 10.2 (%)   Platelets 173  150 - 400 (K/uL)   Dg Cervical Spine 2-3 Views  01/16/2011  *RADIOLOGY REPORT*  Clinical Data: Anterior fusion at C6-7  CERVICAL SPINE - 2-3 VIEW  Comparison: MR c-spine of 01/09/2011  Findings: A cross-table lateral portable view shows an instrument for localization at the C6-7 level.  Considerable osteophyte formation is noted anteriorly from C2-C5 with apparent fusion at C5- 6.  A second portable films shows placement of anterior fixation plate at the V2-5 level with no change in alignment.  IMPRESSION: Anterior fusion at C6-7.  Normal alignment.  Diffuse degenerative change.  Original Report Authenticated By: Juline Patch, M.D.    Assessment/Plan: Diagnosis: C6/C7 stenosis with myelopathy status post decompression 1. Does the need for close, 24 hr/day medical supervision in concert with the patient's rehab needs make it unreasonable for this patient to be served in a less intensive setting? Yes 2. Co-Morbidities requiring supervision/potential complications: Neurogenic bowel and bladder, history of BPH and hypertension 3. Due to bladder management, bowel management, safety, skin/wound care, medication administration, pain management and patient education, does the patient require 24 hr/day rehab nursing? Yes 4. Does the patient require coordinated care of a physician, rehab nurse, PT (1-2 hrs/day, 5 days/week) and OT (1-2 hrs/day, 5 days/week) to address physical and functional deficits in the context of the above medical diagnosis(es)? Yes Addressing deficits in the following areas: balance, bathing, bowel/bladder control, dressing,  endurance, feeding, grooming, locomotion, toileting and transferring 5. Can the patient actively participate in an intensive therapy program of at least 3 hrs of therapy per day at least 5 days per week? Yes 6. The potential for patient to make measurable gains while on inpatient rehab is excellent 7. Anticipated functional outcomes upon discharge from inpatients are supervision to modified independent PT, modified independent to set up OT 8. Estimated rehab length of stay to reach the above functional goals is: 1-2 weeks 9. Does the patient have adequate social supports to accommodate these discharge functional goals? Yes 10. Anticipated D/C setting: Home 11. Anticipated post D/C treatments: HH therapy 12. Overall Rehab/Functional Prognosis: excellent  RECOMMENDATIONS: This patient's condition is appropriate for continued rehabilitative care in the following setting: CIR Patient has agreed to participate in recommended program. Yes Note that insurance prior authorization may be required for reimbursement for recommended care.  Comment: Await physical therapy evaluation. Patient was moderate assistance today for basic transfers and activities with occupational therapy. He seems to have adequate social supports to provide and help if needed at home. He is quite motivated. Spoke with daughter who is present today. She indicated to me that this was a progressive process over the last 2 months that finally reached a point where he was unable to function independently anymore.    01/17/2011

## 2011-01-17 NOTE — Progress Notes (Signed)
Occupational Therapy Evaluation Patient Details Name: HATIM HOMANN MRN: [id number] DOB: 11/15/1933 Today's Date: 01/17/2011 9:42-10:48  [id number] Problem List:  Patient Active Problem List  Diagnoses  . HTN (hypertension)  . Dyslipidemia  . BPH (benign prostatic hyperplasia)  . GERD (gastroesophageal reflux disease)  . Lumbar stenosis with neurogenic claudication  . Cord compression  . Irritable bowel syndrome (IBS)  . Gross hematuria    Past Medical History:  Past Medical History  Diagnosis Date  . Hypertension   . GERD (gastroesophageal reflux disease)   . High cholesterol   . Enlarged prostate   . High cholesterol    Past Surgical History:  Past Surgical History  Procedure Date  . Joint replacement 2001  and 2002    both knees  . Laminectomy 08/2010  . Back surgery   . Replacement total knee   . Cholecystectomy 11/01/10    OT Assessment/Plan/Recommendation OT Assessment Clinical Impression Statement: Pleasant 77 year old male admitted for c6-c7 discectomyand fusion.  Currently presents with the need for overall mod assist for functional transfers and max asssit for LB dressing and selfcare.  Only has supervision assistance from his wife at d/c.  Will benefit from acute OT services to address his current strength and balance deficits to increase his independence.  Feel he will benefit from comprehensive CIR follow-up rehab prior to hom to reach supervision level. OT Recommendation/Assessment: Patient will need skilled OT in the acute care venue OT Problem List: Decreased strength;Decreased activity tolerance;Decreased knowledge of use of DME or AE;Impaired balance (sitting and/or standing);Decreased knowledge of precautions Barriers to Discharge: Decreased caregiver support OT Therapy Diagnosis : Generalized weakness OT Plan OT Frequency: Min 2X/week OT Treatment/Interventions: Self-care/ADL training;Therapeutic exercise;DME and/or AE instruction;Patient/family  education;Balance training;Therapeutic activities OT Recommendation Recommendations for Other Services: Rehab consult Equipment Recommended: None recommended by OT Individuals Consulted Consulted and Agree with Results and Recommendations: Patient;Family member/caregiver Family Member Consulted: Daughter OT Goals Acute Rehab OT Goals OT Goal Formulation: With patient/family Time For Goal Achievement: 7 days ADL Goals Pt Will Perform Grooming: with supervision;Standing at sink ADL Goal: Grooming - Progress: Progressing toward goals Pt Will Perform Lower Body Bathing: with min assist;with adaptive equipment;Sit to stand from chair;Sit to stand from bed ADL Goal: Lower Body Bathing - Progress: Progressing toward goals Pt Will Perform Lower Body Dressing: with min assist;Sit to stand from bed;Sit to stand from chair;with adaptive equipment ADL Goal: Lower Body Dressing - Progress: Progressing toward goals Pt Will Transfer to Toilet: with min assist;with DME;3-in-1 ADL Goal: Toilet Transfer - Progress: Progressing toward goals Pt Will Perform Toileting - Clothing Manipulation: with min assist;Sitting on 3-in-1 or toilet;Standing ADL Goal: Toileting - Clothing Manipulation - Progress: Progressing toward goals Pt Will Perform Toileting - Hygiene: with min assist;Sit to stand from 3-in-1/toilet ADL Goal: Toileting - Hygiene - Progress: Progressing toward goals  OT Evaluation Precautions/Restrictions  Precautions Precaution Comments: Cervical precautions Required Braces or Orthoses: Yes Cervical Brace: Soft collar Restrictions Weight Bearing Restrictions: No Prior Functioning Home Living Lives With: Spouse Receives Help From: Family;Other (Comment) (pts son lives down the street.) Type of Home: House Home Layout: One level Home Access: Stairs to enter Entrance Stairs-Rails: Left Entrance Stairs-Number of Steps: 3 Bathroom Shower/Tub: Engineer, manufacturing systems:  Standard Bathroom Accessibility: Yes Home Adaptive Equipment: Bedside commode/3-in-1;Walker - rolling;Tub transfer bench;Straight cane Prior Function Level of Independence: Independent with basic ADLs;Independent with homemaking with ambulation Vocation: Retired ADL ADL Eating/Feeding: Performed;Modified independent Where Assessed - Eating/Feeding: Chair Grooming: Simulated;Supervision/safety  Where Assessed - Grooming: Sitting, chair Upper Body Bathing: Simulated;Supervision/safety Where Assessed - Upper Body Bathing: Sitting, chair;Supported Lower Body Bathing: Simulated;Moderate assistance Where Assessed - Lower Body Bathing: Sit to stand from bed Upper Body Dressing: Simulated;Supervision/safety Where Assessed - Upper Body Dressing: Sitting, bed Lower Body Dressing: Simulated;Maximal assistance Where Assessed - Lower Body Dressing: Sit to stand from bed Toilet Transfer: Simulated;Moderate assistance Toilet Transfer Method: Stand pivot Toilet Transfer Equipment:  (Bedside chair) Toileting - Clothing Manipulation: Simulated;Moderate assistance Where Assessed - Toileting Clothing Manipulation:  (Sit to stand from bedside chair.) Toileting - Hygiene: Simulated Where Assessed - Toileting Hygiene:  (Sit to stand from chair) Tub/Shower Transfer: Not assessed Tub/Shower Transfer Method: Not assessed Equipment Used: Rolling walker ADL Comments: Pt overall needs mod to max assist for selfcare and toileting tasks.  Will benefit from AE instruction secondary to exhibiting decreased flexibility and having cervical precautions to follow.  Mod assist for sit to stand throughout session. Vision/Perception  Vision - History Baseline Vision: No visual deficits Cognition Cognition Arousal/Alertness: Awake/alert Overall Cognitive Status: Appears within functional limits for tasks assessed Orientation Level: Oriented X4 Sensation/Coordination Sensation Light Touch: Appears Intact (In upper  extremities) Coordination Gross Motor Movements are Fluid and Coordinated: Yes Fine Motor Movements are Fluid and Coordinated: Yes Extremity Assessment RUE Assessment RUE Assessment: Within Functional Limits (Strength at least 4/5 throughout.) LUE Assessment LUE Assessment: Within Functional Limits (Strength at least 4/5 throughout.) Mobility  Bed Mobility Bed Mobility: Yes Rolling Left: 3: Mod assist Left Sidelying to Sit: 3: Mod assist Transfers Transfers: Yes Sit to Stand: 3: Mod assist;From bed;From chair/3-in-1;With upper extremity assist Exercises   End of Session OT - End of Session Equipment Utilized During Treatment: Gait belt;Cervical collar (RW) Activity Tolerance: Patient limited by fatigue Patient left: in chair;with call bell in reach;with family/visitor present Nurse Communication: Mobility status for transfers General Behavior During Session: [facility] for tasks performed Cognition: [facility] for tasks performed   [place]aled[place] Zou OTR/L 01/17/2011, 11:19 AM Pager number [phone]

## 2011-01-17 NOTE — Progress Notes (Signed)
CIR CONSULTED, AWAITING PT/OT NOTES.  WILL F/U ON THE APPROVAL. Willa Rough 01/17/2011 [phone] OR [phone]

## 2011-01-17 NOTE — Progress Notes (Signed)
Subjective: The patient's Foley bag continues to have dark red urine reflecting gross hematuria. He was seen in consultation by urology who recommends just flushing the Foley catheter. Low-fat first hematuria secondary to trauma there've been no further testing or interventions beyond that. He is also noted however that his urine culture grew out coagulase-negative staph species. The patient is sitting up in a chair and states that he feels well today. I rounded on the patient at about 11:00 and the patient is requesting definitive beyond lunch and then go back to bed. He states that he worked with physical therapy today and they indicated that he did well. There is a consult in place with the patient to be evaluated by inpatient rehabilitation. Objective: Filed Vitals:   01/17/11 0732 01/17/11 0955 01/17/11 0957 01/17/11 1539  BP: 158/81 142/75 142/75 153/81  Pulse: 67  75 65  Temp: 97.2 F (36.2 C)  97.4 F (36.3 C) 97.9 F (36.6 C)  TempSrc: Oral  Oral Oral  Resp: 17  18 18   Height:      Weight:      SpO2: 99% 94% 97% 97%   Weight change:   Intake/Output Summary (Last 24 hours) at 01/17/11 1904 Last data filed at 01/17/11 1842  Gross per 24 hour  Intake    600 ml  Output   7900 ml  Net  -7300 ml    General: Alert, awake, oriented x3, in no acute distress.  Cardiovascular: Heart sounds regular. No murmurs, rubs, or gallops.  Respiratory: Lungs clear to auscultation bilaterally.  Gastrointestinal: Abdomen soft, mildly tender. No masses. Bowel sounds present x4.  Extremities: No clubbing, edema, or cyanosis Abdomen: Soft nontender no masses no hepatosplenomegaly is noted.   Lab Results:  [facility] 01/15/11 0540  NA --  K --  CL --  CO2 --  GLUCOSE --  BUN --  CREATININE 1.06  CALCIUM --  MG --  PHOS --   No results found for this basename: AST:2,ALT:2,ALKPHOS:2,BILITOT:2,PROT:2,ALBUMIN:2 in the last 72 hours No results found for this basename: LIPASE:2,AMYLASE:2 in  the last 72 hours  Basename 01/16/11 1650  WBC 17.9*  NEUTROABS --  HGB 14.9  HCT 40.9  MCV 80.7  PLT 173   No results found for this basename: CKTOTAL:3,CKMB:3,CKMBINDEX:3,TROPONINI:3 in the last 72 hours No results found for this basename: POCBNP:3 in the last 72 hours No results found for this basename: DDIMER:2 in the last 72 hours No results found for this basename: HGBA1C:2 in the last 72 hours No results found for this basename: CHOL:2,HDL:2,LDLCALC:2,TRIG:2,CHOLHDL:2,LDLDIRECT:2 in the last 72 hours No results found for this basename: TSH,T4TOTAL,FREET3,T3FREE,THYROIDAB in the last 72 hours No results found for this basename: VITAMINB12:2,FOLATE:2,FERRITIN:2,TIBC:2,IRON:2,RETICCTPCT:2 in the last 72 hours  Micro Results: Recent Results (from the past 240 hour(s))  URINE CULTURE     Status: Normal (Preliminary result)   Collection Time   01/15/11  1:38 PM      Component Value Range Status Comment   Specimen Description URINE, CATHETERIZED   Final    Special Requests NONE   Final    Setup Time [id number]   Final    Colony Count 85,000 COLONIES/ML   Final    Culture     Final    Value: STAPHYLOCOCCUS SPECIES (COAGULASE NEGATIVE)     Note: RIFAMPIN AND GENTAMICIN SHOULD NOT BE USED AS SINGLE DRUGS FOR TREATMENT OF STAPH INFECTIONS.   Report Status PENDING   Incomplete     Studies/Results: Dg Chest 2 View  01/11/2011  *RADIOLOGY REPORT*  Clinical Data: Preoperative chest radiograph for cervical spine surgery.  CHEST - 2 VIEW  Comparison: Chest radiograph performed 01/08/2011  Findings: The lungs are well-aerated and clear.  There is no evidence of focal opacification, pleural effusion or pneumothorax. The right costophrenic angle is incompletely imaged on the frontal view.  The heart is normal in size; the mediastinal contour is within normal limits.  No acute osseous abnormalities are seen.  Mild anterior bridging osteophytes are noted along the lower thoracic spine.   IMPRESSION: No acute cardiopulmonary process seen.  Original Report Authenticated By: Tonia Ghent, M.D.   Dg Chest 2 View  01/08/2011  *RADIOLOGY REPORT*  Clinical Data: Numbness in both legs since yesterday.  Weakness.  CHEST - 2 VIEW  Comparison: CT 11/02/2010.  Plain films 08/18/2010.  Findings: Lateral view degraded by patient arm position.  Mild thoracic spondylosis.  Apical lordotic frontal view. Midline trachea.  Normal heart size and mediastinal contours. No pleural effusion or pneumothorax.  Clear lungs.  IMPRESSION: Normal chest.  Original Report Authenticated By: Consuello Bossier, M.D.   Ct Head Wo Contrast  01/08/2011  *RADIOLOGY REPORT*  Clinical Data: Bilateral leg weakness  CT HEAD WITHOUT CONTRAST  Technique:  Contiguous axial images were obtained from the base of the skull through the vertex without contrast.  Comparison: 01/27/2007  Findings: Mild atrophy. There is no evidence of acute intracranial hemorrhage, brain edema, mass lesion, acute infarction,   mass effect, or midline shift. Acute infarct may be inapparent on noncontrast CT.  No other intra-axial abnormalities are seen, and the ventricles and sulci are within normal limits in size and symmetry.   No abnormal extra-axial fluid collections or masses are identified.  No significant calvarial abnormality.  IMPRESSION: 1. Negative for bleed or other acute intracranial process.  Original Report Authenticated By: Osa Craver, M.D.   [name] Contrast  01/09/2011  *RADIOLOGY REPORT*  Clinical Data: Lower extremity weakness.  MRI HEAD WITHOUT AND WITH CONTRAST  Technique:  Multiplanar, multiecho pulse sequences of the brain and surrounding structures were obtained according to standard protocol without and with intravenous contrast  Contrast: 1 MULTIHANCE GADOBENATE DIMEGLUMINE 529 MG/ML IV SOLN  Comparison: CT 01/08/2011  Findings: Age appropriate atrophy.  Slight chronic microvascular ischemic change in the white  matter.  Negative for acute infarct. Negative for hemorrhage or mass.  Postcontrast imaging the brain reveals normal enhancement.  Mild mucosal edema in the paranasal sinuses.  Joint effusion involving the right C1-C2 joint.  IMPRESSION: No acute intracranial abnormality.  Original Report Authenticated By: Camelia Phenes, M.D.   Mr Cervical Spine W Wo Contrast  01/09/2011  **ADDENDUM** CREATED: 01/09/2011 13:34:23  Critical Value/emergent results were called by telephone at the time of interpretation on 01/09/2011  at 1136 hours  to  Dr. Jerral Ralph, who verbally acknowledged these results.  **END ADDENDUM** SIGNED BY: Andreas Newport, M.D.   01/09/2011  *RADIOLOGY REPORT*  Clinical Data:  Leg weakness.  Lower extremity weakness and inability to ambulate.  Spinal stenosis.  MRI CERVICAL SPINE WITHOUT AND WITH CONTRAST  Technique:  Multiplanar and multiecho pulse sequences of the cervic al spine, to include the craniocervical junction and cervicothoraci c junction, were obtained according to standard protocol without an d with intravenous contrast.  Contrast: 15 ml Multihance  Comparison: None.  Findings:  Exaggeration of the normal cervical lordosis.  There appears to be ankylosis extending from C4-C6, with confluent anterior osteophytes. The appearance  is compatible with diffuse idiopathic skeletal hyperostosis. Cervical cord edema is present associated with cervical cord compression at C6-C7.  There is probable ankylosis across C2-C3 as well.  Small right greater than left atlanto-occipital effusions are present.  Craniocervical junction appears normal. Nonspecific cystic appearing lesion is present in the right thyroid lobe measuring 15 mm.  C2-C3:  Central canal patent.  Left foraminal encroachment associated with facet arthrosis.  C3-C4:  Mild central stenosis associated with broad-based disc osteophyte complex.  Mild right posterior ligamentum flavum redundancy.  Disc osteophyte complex contacts the ventral  aspect of the cervical cord and may flattened it slightly.  Right greater than left foraminal stenosis associated with uncovertebral spurring and facet arthrosis.  C4-C5:  Apparent ankylosis across the disc space.  Central canal and foramina appear patent.  C5-C6:  Facet hypertrophy.  Apparent ankylosis.  Mild central stenosis associated with osseous ridging.  No definite cord deformity.  C6-C7:  Severe central stenosis with compression of the cervical cord. Mild edema is present above and below the levels of compression.  Broad-based disc osteophyte complex and posterior ligamentum flavum redundancy produce the severe central stenosis. Bilateral foraminal stenosis secondary uncovertebral spurring. Bilateral facet effusions.  C7-T1:  Severe right facet arthrosis with right foraminal stenosis potentially affecting the right C8 nerve.  The foramen appears patent. Minimal central stenosis associated with posterior element hypertrophy and ligamentum flavum redundancy.  IMPRESSION: 1.  Cervical cord compression at C6-C7 with cord edema and / or myelomalacia.  Severe spinal stenosis due to broad-based disc osteophyte complex and posterior ligamentum flavum redundancy. 2.  Diffuse idiopathic skeletal hyperostosis with ankylosis from C4- C6, probably extending to C2 and C3 as well. 3.  Mild C3-C4 central stenosis secondary to disc osteophyte complex.  Right greater than left foraminal stenosis associated uncovertebral spurring.  MRI THORACIC SPINE WITHOUT AND WITH CONTRAST  Technique:  Multiplanar and multiecho pulse sequences of the thoracic spine were obtained without and with intravenous contrast.  Contrast: 15 MULTIHANCE GADOBENATE DIMEGLUMINE 529 MG/ML IV SOLN  Findings:  Mild dextroconvex curvature of the thoracic spine is present.  Remote compression fractures T7 and T8, with approximately 25% loss of vertebral body height.  No marrow edema. The thoracic cord demonstrates normal caliber and signal. Paraspinal soft  tissues are within normal limits.  Scattered vertebral body hemangiomata are present.  High signal is present within the discs at T6-T7 and T7-T8 with post-gadolinium enhancement.  There is no endplate edema, erosion or enhancement and this is compatible with degenerative enhancement of the discs.  This may also be due to ossification of the discs with development of marrow space.  Thoracic spine DISH is present.  Tiny central disc protrusion is present at T7-T8, just contacting the ventral aspect of the thoracic cord.  Per CMS PQRS reporting requirements (PQRS Measure 24): Given the patient's age of greater than 50 and the fracture site (hip, distal radius, or spine), the patient should be tested for osteoporosis using DXA, and the appropriate treatment considered based on the DXA results.  IMPRESSION: No thoracic cord compression.  Thoracic spine DISH and chronic compression fractures of T7 and T8 (25% loss of height) without retropulsion. Original Report Authenticated By: Andreas Newport, M.D.   Mr Thoracic Spine W Wo Contrast  01/09/2011  **ADDENDUM** CREATED: 01/09/2011 13:34:23  Critical Value/emergent results were called by telephone at the time of interpretation on 01/09/2011  at 1136 hours  to  Dr. Jerral Ralph, who verbally acknowledged these results.  **END ADDENDUM** SIGNED  BY: Andreas Newport, M.D.   01/09/2011  *RADIOLOGY REPORT*  Clinical Data:  Leg weakness.  Lower extremity weakness and inability to ambulate.  Spinal stenosis.  MRI CERVICAL SPINE WITHOUT AND WITH CONTRAST  Technique:  Multiplanar and multiecho pulse sequences of the cervic al spine, to include the craniocervical junction and cervicothoraci c junction, were obtained according to standard protocol without an d with intravenous contrast.  Contrast: 15 ml Multihance  Comparison: None.  Findings:  Exaggeration of the normal cervical lordosis.  There appears to be ankylosis extending from C4-C6, with confluent anterior osteophytes. The  appearance is compatible with diffuse idiopathic skeletal hyperostosis. Cervical cord edema is present associated with cervical cord compression at C6-C7.  There is probable ankylosis across C2-C3 as well.  Small right greater than left atlanto-occipital effusions are present.  Craniocervical junction appears normal. Nonspecific cystic appearing lesion is present in the right thyroid lobe measuring 15 mm.  C2-C3:  Central canal patent.  Left foraminal encroachment associated with facet arthrosis.  C3-C4:  Mild central stenosis associated with broad-based disc osteophyte complex.  Mild right posterior ligamentum flavum redundancy.  Disc osteophyte complex contacts the ventral aspect of the cervical cord and may flattened it slightly.  Right greater than left foraminal stenosis associated with uncovertebral spurring and facet arthrosis.  C4-C5:  Apparent ankylosis across the disc space.  Central canal and foramina appear patent.  C5-C6:  Facet hypertrophy.  Apparent ankylosis.  Mild central stenosis associated with osseous ridging.  No definite cord deformity.  C6-C7:  Severe central stenosis with compression of the cervical cord. Mild edema is present above and below the levels of compression.  Broad-based disc osteophyte complex and posterior ligamentum flavum redundancy produce the severe central stenosis. Bilateral foraminal stenosis secondary uncovertebral spurring. Bilateral facet effusions.  C7-T1:  Severe right facet arthrosis with right foraminal stenosis potentially affecting the right C8 nerve.  The foramen appears patent. Minimal central stenosis associated with posterior element hypertrophy and ligamentum flavum redundancy.  IMPRESSION: 1.  Cervical cord compression at C6-C7 with cord edema and / or myelomalacia.  Severe spinal stenosis due to broad-based disc osteophyte complex and posterior ligamentum flavum redundancy. 2.  Diffuse idiopathic skeletal hyperostosis with ankylosis from C4- C6, probably  extending to C2 and C3 as well. 3.  Mild C3-C4 central stenosis secondary to disc osteophyte complex.  Right greater than left foraminal stenosis associated uncovertebral spurring.  MRI THORACIC SPINE WITHOUT AND WITH CONTRAST  Technique:  Multiplanar and multiecho pulse sequences of the thoracic spine were obtained without and with intravenous contrast.  Contrast: 15 MULTIHANCE GADOBENATE DIMEGLUMINE 529 MG/ML IV SOLN  Findings:  Mild dextroconvex curvature of the thoracic spine is present.  Remote compression fractures T7 and T8, with approximately 25% loss of vertebral body height.  No marrow edema. The thoracic cord demonstrates normal caliber and signal. Paraspinal soft tissues are within normal limits.  Scattered vertebral body hemangiomata are present.  High signal is present within the discs at T6-T7 and T7-T8 with post-gadolinium enhancement.  There is no endplate edema, erosion or enhancement and this is compatible with degenerative enhancement of the discs.  This may also be due to ossification of the discs with development of marrow space.  Thoracic spine DISH is present.  Tiny central disc protrusion is present at T7-T8, just contacting the ventral aspect of the thoracic cord.  Per CMS PQRS reporting requirements (PQRS Measure 24): Given the patient's age of greater than 50 and the fracture site (hip,  distal radius, or spine), the patient should be tested for osteoporosis using DXA, and the appropriate treatment considered based on the DXA results.  IMPRESSION: No thoracic cord compression.  Thoracic spine DISH and chronic compression fractures of T7 and T8 (25% loss of height) without retropulsion. Original Report Authenticated By: Andreas Newport, M.D.   Mr Lumbar Spine W Wo Contrast  01/08/2011  *RADIOLOGY REPORT*  Clinical Data: Bilateral lower extremity weakness.  History of prior lumbar spine surgery.  Progressive worsening weakness in the lower extremities.  MRI LUMBAR SPINE WITHOUT AND WITH  CONTRAST  Technique:  Multiplanar and multiecho pulse sequences of the lumbar spine were obtained without and with intravenous contrast.  Contrast:  17 ml Multihance.  Comparison: MRI lumbar spine 09/14/2010.  Findings: Numbering convention used on prior exam preserved. Vertebral body height and marrow signal is within normal limits. Paraspinal soft tissues are within normal limits.  Distention of the urinary bladder is present.  No destructive osseous lesions. Postoperative changes of L4 laminectomy.  The small fluid collection in the operative bed seen on the recent comparison has resolved.  T12-L1:  Negative.  L1-L2:  Shallow posterior disc bulging without stenosis.  Mild facet hypertrophy and ligamentum flavum redundancy.  L2-L3:  Mild facet hypertrophy.  No stenosis.  L3-L4:  Bilateral facet hypertrophy and ligamentum flavum redundancy with mild central stenosis.  Unchanged mild symmetric bilateral foraminal stenosis.  L4-L5:  Laminectomy with wide posterior decompression.  Central canal and lateral recesses are patent.  The enhancing granulation tissue is present in the operative bed and the epidural space.  No epidural abscess or fluid collection.  Short pedicles are present with mild left foraminal stenosis.  Facet hypertrophy is present.  L5-S1:  Disc desiccation and degeneration.  Degenerative endplate changes are present.  Central canal and lateral recesses are patent.  Mild left foraminal stenosis associated with endplate spurring and facet hypertrophy.  The left greater than right facet degeneration/hypertrophy.  IMPRESSION:  1.  Evolving postoperative changes of L4 laminectomy with good decompression of the thecal sac and lateral recesses.  Mild left foraminal stenosis associated with short pedicles and facet hypertrophy potentially affecting the left L4 nerve. Resolution of small fluid collection in the operative bed.  Expected enhancing granulation tissue in the operative bed. 2.  Unchanged L5-S1  degenerative disease with mild left foraminal stenosis secondary endplate spurring and facet hypertrophy. 3.  Unchanged L3-L4 facet arthrosis with mild central stenosis.  Original Report Authenticated By: Andreas Newport, M.D.    Medications: I have reviewed the patient's current medications. Scheduled Meds:    . aspirin EC  81 mg Oral Daily  . bacitracin      . ceFAZolin (ANCEF) IV  1 g Intravenous Q8H  . citalopram  10 mg Oral Daily  . dexamethasone  8 mg Intravenous QID  . dicyclomine  10 mg Oral TID AC & HS  . docusate sodium  100 mg Oral BID  . fentaNYL  50 mcg Intravenous Once  . loratadine  10 mg Oral Daily  . LORazepam  1 mg Intravenous Once  . metoprolol succinate  12.5 mg Oral BID  . pantoprazole  40 mg Oral Daily  . polyethylene glycol  17 g Oral BID  . simethicone  80 mg Oral QID  . simvastatin  20 mg Oral q1800  . sodium chloride      . sodium chloride  3 mL Intravenous Q12H  . Tamsulosin HCl  0.4 mg Oral BID  . DISCONTD: ceFAZolin (  ANCEF) IV  1 g Intravenous Once  . DISCONTD: metoprolol succinate  12.5 mg Oral BID   Continuous Infusions:    . sodium chloride    . lactated ringers 75 mL/hr at 01/16/11 1300   PRN Meds:.acetaminophen, acetaminophen, acetaminophen, diazepam, gi cocktail, menthol-cetylpyridinium, midazolam, morphine, ondansetron (ZOFRAN) IV, ondansetron (ZOFRAN) IV, ondansetron, oxyCODONE, phenol, polyvinyl alcohol, promethazine, sodium chloride, zolpidem, zolpidem Assessment/Plan: Principal Problem:  *Cord compression  Assessment: Severe cervical cord compression at C6-C7 with cord edema and/or myelomalacia. Severe spinal stenosis due to broad-based disc osteophyte complex and posterior ligamentum flavum redundancy.  Plan: C6-7 anterior cervical discectomy fusion and plating planned for tomorrow by neurosurgery.   Hematuria The patient has had persistent hematuria. Urine cultures show coagulase negative staph species. All bony 85,000 colonies I will  go ahead and treat with ciprofloxacin in light of the hematuria.  Active Problems:  HTN (hypertension)  Assessment: Controlled.  Plan: Continue metoprolol.  Dyslipidemia  Assessment: Stable  Plan: Continue with Zocor  BPH (benign prostatic hyperplasia)  Assessment: An ongoing issue, but no difficulty urinating.  Plan: Continue with Flomax. Has Foley in place now.Will need voiding trail when foley discontinued.  GERD (gastroesophageal reflux disease)  Assessment: Stable  Plan: Continue with Protonix.  Lumbar stenosis with neurogenic claudication  Assessment: Stable, this is not the cause of patient's symptoms. MRI of the lumbar spine did not show significant pathology in this area.  Plan: monitor.  Irritable bowel syndrome (IBS)  Assessment: No Complaints of abdominal pain today.  Plan: Continue simethicone, MiraLAX, Bentyl.         LOS: 9 days

## 2011-01-17 NOTE — Progress Notes (Signed)
I await P.T. Evaluation and then I can further assess need for admission to inpatient acute rehablitation. Pager [phone]

## 2011-01-17 NOTE — Progress Notes (Signed)
PT Cancellation Note  ___Treatment cancelled today due to medical issues with patient which prohibited therapy  ___ Treatment cancelled today due to patient receiving procedure or test   ___ Treatment cancelled today due to patient's refusal to participate   _x__ Treatment cancelled today due to patient extremely fatigued from day's activities including working with OT and sitting up in chair x3 hours.  PT did help patient re-position in bed by rolling bilaterally (min assist) and scooting to the [facility] by bridging bil. Legs (total A +2- patient 50%).  A formal evaluation will be completed tomorrow once patient is well rested.      Signature: Rollene Rotunda. Rache Klimaszewski, PT, DPT [phone]

## 2011-01-17 NOTE — Progress Notes (Signed)
Patient ID: Ricky Bradley, male   DOB: 27-Dec-1933, 77 y.o.   MRN: [id number] Subjective:  The patient is alert and pleasant. He looks well. He has no complaints.  Objective: Vital signs in last 24 hours: Temp:  [97.1 F (36.2 C)-98.2 F (36.8 C)] 97.1 F (36.2 C) (12/04 0319) Pulse Rate:  [61-79] 70  (12/04 0319) Resp:  [16-42] 16  (12/04 0319) BP: (136-159)/(69-90) 155/79 mmHg (12/04 0319) SpO2:  [95 %-98 %] 98 % (12/04 0319)  Intake/Output from previous day: 12/03 0701 - 12/04 0700 In: 1450 [I.V.:1200; IV Piggyback:250] Out: 3200 [Urine:3175; Blood:25] Intake/Output this shift:    Physical exam the patient is alert and oriented. He is moving all 4 extremities well. His dressing is clean and dry. There is no evidence of hematoma or shift.  Lab Results:  Basename 01/16/11 1650 01/14/11 0800  WBC 17.9* 9.2  HGB 14.9 16.3  HCT 40.9 45.6  PLT 173 176   BMET  Basename 01/15/11 0540 01/14/11 0800  NA -- 137  K -- 4.0  CL -- 99  CO2 -- 27  GLUCOSE -- 152*  BUN -- 29*  CREATININE 1.06 1.03  CALCIUM -- 9.0    Studies/Results: Dg Cervical Spine 2-3 Views  01/16/2011  *RADIOLOGY REPORT*  Clinical Data: Anterior fusion at C6-7  CERVICAL SPINE - 2-3 VIEW  Comparison: MR c-spine of 01/09/2011  Findings: A cross-table lateral portable view shows an instrument for localization at the C6-7 level.  Considerable osteophyte formation is noted anteriorly from C2-C5 with apparent fusion at C5- 6.  A second portable films shows placement of anterior fixation plate at the G4-0 level with no change in alignment.  IMPRESSION: Anterior fusion at C6-7.  Normal alignment.  Diffuse degenerative change.  Original Report Authenticated By: Juline Patch, M.D.    Assessment/Plan: Postop day #1: Patient is doing well. We will mobilize him with PT/OT.  LOS: 9 days     Annie Saephan D 01/17/2011, 7:22 AM

## 2011-01-17 NOTE — Progress Notes (Signed)
1 Day Post-Op Ricky Bradley is still having gross hematuria. He reports that he has not had bladder urinations on a regular basis. I received a call earlier today about an adequate irrigation by the nurse. I ordered a 20 French Foley catheter to be placed. Objective: Vital signs in last 24 hours: Temp:  [97.1 F (36.2 C)-98.2 F (36.8 C)] 97.9 F (36.6 C) (12/04 1539) Pulse Rate:  [61-75] 65  (12/04 1539) Resp:  [16-18] 18  (12/04 1539) BP: (142-158)/(75-82) 153/81 mmHg (12/04 1539) SpO2:  [94 %-99 %] 97 % (12/04 1539)  Intake/Output from previous day: 12/03 0701 - 12/04 0700 In: 1450 [I.V.:1200; IV Piggyback:250] Out: 3200 [Urine:3175; Blood:25] Intake/Output this shift:    Physical Exam:  General: Ricky Bradley appears comfortable GI: not done and not indicated Foley catheter is present at the urethral meatus. There is blood around the catheter/at the meatus.   Lab Results:  Basename 01/16/11 1650  HGB 14.9  HCT 40.9   BMET  Basename 01/15/11 0540  NA --  K --  CL --  CO2 --  GLUCOSE --  BUN --  CREATININE 1.06  CALCIUM --   No results found for this basename: LABPT:3,INR:3 in the last 72 hours No results found for this basename: LABURIN:1 in the last 72 hours Results for orders placed during the hospital encounter of 01/08/11  URINE CULTURE     Status: Normal (Preliminary result)   Collection Time   01/15/11  1:38 PM      Component Value Range Status Comment   Specimen Description URINE, CATHETERIZED   Final    Special Requests NONE   Final    Setup Time [id number]   Final    Colony Count 85,000 COLONIES/ML   Final    Culture     Final    Value: STAPHYLOCOCCUS SPECIES (COAGULASE NEGATIVE)     Note: RIFAMPIN AND GENTAMICIN SHOULD NOT BE USED AS SINGLE DRUGS FOR TREATMENT OF STAPH INFECTIONS.   Report Status PENDING   Incomplete     Studies/Results: Dg Cervical Spine 2-3 Views  01/16/2011  *RADIOLOGY REPORT*  Clinical Data: Anterior fusion at C6-7  CERVICAL  SPINE - 2-3 VIEW  Comparison: MR c-spine of 01/09/2011  Findings: A cross-table lateral portable view shows an instrument for localization at the C6-7 level.  Considerable osteophyte formation is noted anteriorly from C2-C5 with apparent fusion at C5- 6.  A second portable films shows placement of anterior fixation plate at the Z6-1 level with no change in alignment.  IMPRESSION: Anterior fusion at C6-7.  Normal alignment.  Diffuse degenerative change.  Original Report Authenticated By: Juline Patch, M.D.    Assessment/Plan: 1. Gross hematuria. This continues. Unfortunately, the catheter was not replaced per my order earlier today. Catheter irrigation has not been properly done. His hematocrit is slightly lower, I do not think he has had excessive loss from his bladder, however. I replaced a 20 French Foley catheter today. He was irrigated with approximately 500 cc of saline. Very few clots were irrigated. This was hooked to dependent drainage. I have, once again, written an order for catheter irrigation using a [place] syringe every 6 hours. For now, continue catheter irrigation. 2. Urinary tract infection, coag negative staph. He is currently on IV Ancef.     LOS: 9 days   Ricky Bradley M 01/17/2011, 7:34 PM

## 2011-01-18 ENCOUNTER — Encounter (HOSPITAL_COMMUNITY): Payer: Self-pay | Admitting: Neurosurgery

## 2011-01-18 LAB — URINE CULTURE: Colony Count: 85000

## 2011-01-18 MED ORDER — LIDOCAINE HCL 2 % EX GEL
Freq: Once | CUTANEOUS | Status: AC
  Administered 2011-01-18: 12:00:00 via URETHRAL
  Filled 2011-01-18: qty 5

## 2011-01-18 NOTE — Progress Notes (Signed)
Subjective: Complaining of suprapubic discomfort and also complaining of neck/back pain. No fever, no CP, no SOB.  Objective: Vital signs in last 24 hours: Temp:  [97 F (36.1 C)-97.9 F (36.6 C)] 97 F (36.1 C) (12/05 0400) Pulse Rate:  [65-70] 67  (12/05 0400) Resp:  [18] 18  (12/05 0400) BP: (136-153)/(76-81) 137/79 mmHg (12/05 0400) SpO2:  [97 %-98 %] 97 % (12/05 0400) Weight change:  Last BM Date: 01/14/11  Intake/Output from previous day: 12/04 0701 - 12/05 0700 In: 600 [I.V.:600] Out: 5800 [Urine:5800] Total I/O In: 240 [P.O.:240] Out: -    Physical Exam: General: Alert, awake, oriented x3, in no acute distress. HEENT: No bruits, no goiter. Heart: Regular rate and rhythm, without murmurs, rubs, gallops. Lungs: Clear to auscultation bilaterally. Abdomen: Soft, mild tenderness in suprapubic region, nondistended, positive bowel sounds. Extremities: No clubbing cyanosis or edema with positive pedal pulses. Neuro: no new focal deficit.   Lab Results:  CBC:  Basename 01/16/11 1650  WBC 17.9*  NEUTROABS --  HGB 14.9  HCT 40.9  MCV 80.7  PLT 173   CBG:  Basename 01/17/11 1618  GLUCAP 200*    Misc. Labs:  Recent Results (from the past 240 hour(s))  URINE CULTURE     Status: Normal (Preliminary result)   Collection Time   01/15/11  1:38 PM      Component Value Range Status Comment   Specimen Description URINE, CATHETERIZED   Final    Special Requests NONE   Final    Setup Time [id number]   Final    Colony Count 85,000 COLONIES/ML   Final    Culture     Final    Value: STAPHYLOCOCCUS SPECIES (COAGULASE NEGATIVE)     Note: RIFAMPIN AND GENTAMICIN SHOULD NOT BE USED AS SINGLE DRUGS FOR TREATMENT OF STAPH INFECTIONS.   Report Status PENDING   Incomplete     Studies/Results: No results found.  Medications: Scheduled Meds:   . aspirin EC  81 mg Oral Daily  . ciprofloxacin  500 mg Oral BID  . citalopram  10 mg Oral Daily  . dexamethasone  8 mg  Intravenous QID  . dicyclomine  10 mg Oral TID AC & HS  . docusate sodium  100 mg Oral BID  . fentaNYL  50 mcg Intravenous Once  . loratadine  10 mg Oral Daily  . LORazepam  1 mg Intravenous Once  . metoprolol succinate  12.5 mg Oral BID  . pantoprazole  40 mg Oral Daily  . polyethylene glycol  17 g Oral BID  . simethicone  80 mg Oral QID  . simvastatin  20 mg Oral q1800  . sodium chloride  3 mL Intravenous Q12H  . Tamsulosin HCl  0.4 mg Oral BID   Continuous Infusions:   . sodium chloride    . lactated ringers 75 mL/hr at 01/16/11 1300   PRN Meds:.acetaminophen, acetaminophen, acetaminophen, diazepam, gi cocktail, menthol-cetylpyridinium, morphine, ondansetron (ZOFRAN) IV, ondansetron (ZOFRAN) IV, ondansetron, oxyCODONE, phenol, polyvinyl alcohol, promethazine, sodium chloride, zolpidem, zolpidem  Assessment/Plan: 1-Cord compression: S/P surgery; at this point plan is to continue decadron, continue pain medications and physical rehab. Will follow further recommendations from neurosurgery.  2-Hematuria: still ongoing; despite irrigation, patient with frank hematuria. Will follow recommendations from urology and will continue checking Hgb.  3-HTN (hypertension):stable and well controlled, continue current regimen.  4-Dyslipidemia:continue zocor.  5-BPH (benign prostatic hyperplasia): continue flomax  6-GERD (gastroesophageal reflux disease):Continue PPI.  7-Irritable bowel syndrome (IBS): continue bentyl and  PRN simethicone.  8-UTI 2/2 staph: continue cipro.  9-Depression: continue citalopram.  10-Leukocytosis: secondary to decadron most likely; no fever. Also UTI might be contributing some; will start continue tx with ciprofloxacin.  11-DVT: SCD's.     LOS: 10 days   Rona Tomson 01/18/2011, 10:00 AM

## 2011-01-18 NOTE — Progress Notes (Signed)
PT Cancellation Note  ___Treatment cancelled today due to medical issues with patient which prohibited therapy  ___ Treatment cancelled today due to patient receiving procedure or test   __x_ Treatment cancelled today due to patient's refusal to participate.  The patient is exhausted from the morning events with his bladder.  He is now resting comfortably in the bed.  We did review some bed exercises he could perform on his own (ankle pumps, quad sets, heel slides, SLR, and hip abduction/adduction each hour).  PT to f/u tomorrow to try to complete the evaluation.    ___ Treatment cancelled today due to   Signature: Tearah Saulsbury B. Mancil Pfenning, PT, DPT [phone]

## 2011-01-18 NOTE — Progress Notes (Signed)
Subjective: Ricky Bradley is still having clots. I was called earlier in the day, and recommended that a 20 [place] coud tip catheter be placed. A catheter was placed, but it was not a 20 [place] coud. It was 40 [place]. He is draining urine which looks clear.   Objective: Vital signs in last 24 hours: Temp:  [97 F (36.1 C)-97.9 F (36.6 C)] 97.8 F (36.6 C) (12/05 1829) Pulse Rate:  [67-83] 77  (12/05 1829) Resp:  [18] 18  (12/05 1829) BP: (126-137)/(71-79) 130/71 mmHg (12/05 1829) SpO2:  [97 %-98 %] 98 % (12/05 1829)  Intake/Output from previous day: 12/04 0701 - 12/05 0700 In: 600 [I.V.:600] Out: 5800 [Urine:5800] Intake/Output this shift: Total I/O In: 1080 [P.O.:1080] Out: 2700 [Urine:2700]  Physical Exam:  Catheter was present in his meatus with slight blood around it.  Lab Results:  Basename 01/16/11 1650  HGB 14.9  HCT 40.9    Assessment/Plan: Gross hematuria, most likely secondary to prostatic bleeding. Unfortunately, proper catheter placement order was not followed. He is draining adequately through the smaller catheter. I spent 20 minutes and used 2000 cc of saline to irrigate the patient's catheter. It was fairly clear at the time of completion.  If he continues to have significant hematuria and clots, I would recommend that a 22 French Foley catheter with three-way irrigation be placed. This should be hooked up to continuous bladder irrigation with saline. I would be more than happy to talk with the charge nurse or the unit coordinator to discuss how to properly manage this patient's bladder. If there are further questions, please call.  I will order a CBC to be drawn in the morning.   Bertram Millard. Katelynn Heidler, MD  01/18/2011, 6:35 PM

## 2011-01-18 NOTE — Progress Notes (Signed)
Patient ID: Ricky Bradley, male   DOB: [birth year], 77 y.o.   MRN: [id number] Subjective:  The patient is alert and pleasant. He has no complaints. He has no symptoms of weakness in his right foot.  Objective: Vital signs in last 24 hours: Temp:  [97 F (36.1 C)-97.9 F (36.6 C)] 97.8 F (36.6 C) (12/05 1829) Pulse Rate:  [67-83] 77  (12/05 1829) Resp:  [18] 18  (12/05 1829) BP: (126-137)/(71-79) 130/71 mmHg (12/05 1829) SpO2:  [97 %-98 %] 98 % (12/05 1829)  Intake/Output from previous day: 12/04 0701 - 12/05 0700 In: 600 [I.V.:600] Out: 5800 [Urine:5800] Intake/Output this shift: Total I/O In: 1080 [P.O.:1080] Out: 2700 [Urine:2700]  Physical exam the patient is alert and oriented. He is moving all 4 extremities well. He has some slight weakness in his right dorsiflexors at 4/5. His incision is healing well. There is no signs of hematoma or shift.  Lab Results:  Basename 01/16/11 1650  WBC 17.9*  HGB 14.9  HCT 40.9  PLT 173   BMET No results found for this basename: NA:2,K:2,CL:2,CO2:2,GLUCOSE:2,BUN:2,CREATININE:2,CALCIUM:2 in the last 72 hours  Studies/Results: No results found.  Assessment/Plan: Postop day #2: The patient seems to be progressing well. We are awaiting a rehabilitation bed  Hematuria: Dr. Lynnae Sandhoff is managing this.  LOS: 10 days     Ricky Bradley D 01/18/2011, 6:44 PM

## 2011-01-18 NOTE — Progress Notes (Signed)
PT Cancellation Note  _x__Treatment cancelled today due to medical issues with patient which prohibited therapy.  Patient having extreme bladder discomfort.  RN addressing and calling MD now.  Patient is unable to work with PT right now due to discomfort and pain.  I did notice as I repositioned him on his side and cleaned him up from a BM that bil legs have increased spasticity today compared to when I helped position him yesterday.  PT will check back later today as time allows to re-attempt evaluation.    ___ Treatment cancelled today due to patient receiving procedure or test   ___ Treatment cancelled today due to patient's refusal to participate   ___ Treatment cancelled today due to   Signature: Drewey Begue B. Marks Scalera, PT, DPT [phone]

## 2011-01-18 NOTE — Progress Notes (Signed)
Noted issues with hematuria today. I will follow up with medical progression and therapy to determine timing of admission to inpatient acute rehabilitation. Pager [phone]

## 2011-01-19 ENCOUNTER — Encounter (INDEPENDENT_AMBULATORY_CARE_PROVIDER_SITE_OTHER): Payer: Self-pay | Admitting: Gastroenterology

## 2011-01-19 LAB — BASIC METABOLIC PANEL
CO2: 29 mEq/L (ref 19–32)
Chloride: 95 mEq/L — ABNORMAL LOW (ref 96–112)
Glucose, Bld: 214 mg/dL — ABNORMAL HIGH (ref 70–99)
Potassium: 4.3 mEq/L (ref 3.5–5.1)
Sodium: 131 mEq/L — ABNORMAL LOW (ref 135–145)

## 2011-01-19 LAB — CBC
Hemoglobin: 13 g/dL (ref 13.0–17.0)
RBC: 4.51 MIL/uL (ref 4.22–5.81)
WBC: 8.6 10*3/uL (ref 4.0–10.5)

## 2011-01-19 LAB — GLUCOSE, CAPILLARY: Glucose-Capillary: 254 mg/dL — ABNORMAL HIGH (ref 70–99)

## 2011-01-19 MED ORDER — INSULIN ASPART 100 UNIT/ML ~~LOC~~ SOLN
0.0000 [IU] | Freq: Three times a day (TID) | SUBCUTANEOUS | Status: DC
  Administered 2011-01-19: 8 [IU] via SUBCUTANEOUS
  Administered 2011-01-20: 3 [IU] via SUBCUTANEOUS
  Administered 2011-01-20: 5 [IU] via SUBCUTANEOUS
  Administered 2011-01-20: 8 [IU] via SUBCUTANEOUS
  Administered 2011-01-21 (×2): 5 [IU] via SUBCUTANEOUS
  Administered 2011-01-22: 3 [IU] via SUBCUTANEOUS
  Administered 2011-01-22: 5 [IU] via SUBCUTANEOUS
  Administered 2011-01-22: 11 [IU] via SUBCUTANEOUS
  Administered 2011-01-23: 8 [IU] via SUBCUTANEOUS
  Administered 2011-01-23: 3 [IU] via SUBCUTANEOUS
  Administered 2011-01-23: 5 [IU] via SUBCUTANEOUS
  Administered 2011-01-24 (×3): 3 [IU] via SUBCUTANEOUS
  Administered 2011-01-25 (×2): 5 [IU] via SUBCUTANEOUS
  Administered 2011-01-25 – 2011-01-26 (×3): 8 [IU] via SUBCUTANEOUS
  Administered 2011-01-26 – 2011-01-27 (×2): 5 [IU] via SUBCUTANEOUS
  Administered 2011-01-27: 8 [IU] via SUBCUTANEOUS
  Filled 2011-01-19 (×2): qty 3

## 2011-01-19 MED ORDER — INSULIN ASPART 100 UNIT/ML ~~LOC~~ SOLN
0.0000 [IU] | Freq: Every day | SUBCUTANEOUS | Status: DC
  Administered 2011-01-19 – 2011-01-22 (×4): 2 [IU] via SUBCUTANEOUS

## 2011-01-19 NOTE — Progress Notes (Signed)
Occupational Therapy Progress Note Patient Details Name: Ricky Bradley MRN: [id number] DOB: 10-24-1933 Today's Date: 01/19/2011  OT Assessment/Plan OT Assessment/Plan Comments on Treatment Session: Pt sat edge of bed for grossly 20 mins with minguard -total assist for balance OT Plan: Discharge plan remains appropriate OT Frequency: Min 2X/week Follow Up Recommendations: Inpatient Rehab Equipment Recommended: Defer to next venue OT Goals ADL Goals Pt Will Perform Grooming: with supervision;Standing at sink ADL Goal: Grooming - Progress: Progressing toward goals Pt Will Perform Lower Body Bathing: with min assist;with adaptive equipment;Sit to stand from chair;Sit to stand from bed ADL Goal: Lower Body Bathing - Progress: Progressing toward goals Pt Will Perform Lower Body Dressing: with min assist;Sit to stand from bed;Sit to stand from chair;with adaptive equipment ADL Goal: Lower Body Dressing - Progress: Progressing toward goals Pt Will Transfer to Toilet: with min assist;with DME;3-in-1 ADL Goal: Toilet Transfer - Progress: Progressing toward goals Pt Will Perform Toileting - Clothing Manipulation: with min assist;Sitting on 3-in-1 or toilet;Standing ADL Goal: Toileting - Clothing Manipulation - Progress: Progressing toward goals Pt Will Perform Toileting - Hygiene: with min assist;Sit to stand from 3-in-1/toilet ADL Goal: Toileting - Hygiene - Progress: Progressing toward goals  OT Treatment Precautions/Restrictions  Precautions Precautions: Fall;Other (comment) (Cervical precautions.) Precaution Comments: Cervical precautions Required Braces or Orthoses: Yes Cervical Brace: Hard collar;Applied in supine position Restrictions Weight Bearing Restrictions: No ADL Grooming: Performed;Wash/dry face;Set up Where Assessed - Grooming: Sitting, bed (mod-total assist for balance edge of bed) Lower Body Dressing: +2 Total assistance(Pt was unable to cross leg at knee to don and  required therapist assistance to donn) Where Assessed - Lower Body Dressing: Sitting, edge of bed Equipment Used: Rolling walker Mobility  Bed Mobility Bed Mobility: Yes Rolling Left: 1: +2 Total assist;Patient percentage (comment) (+2 total assist (pt=40%)) Rolling Left Details (indicate cue type and reason): Assist to facilitate flexion of bilateral LEs and rotation of trunk.  Cues for sequence. Left Sidelying to Sit: 1: +2 Total assist;Patient percentage (comment);HOB flat (+2 total assist (pt=40%)) Left Sidelying to Sit Details (indicate cue type and reason): Assist to translate trunk to midline as well as manuever bilateral LEs off of EOB with cues for sequence. Transfers Transfers: Yes Sit to Stand: 1: +2 Total assist (pt=50% Simultaneous filing. User may not have seen previous data.) Sit to Stand Details (indicate cue type and reason): min v.c. foot placement/ hand position (Simultaneous filing. User may not have seen previous data.) Stand to Sit: 1: +2 Total assist;Patient percentage (comment);To chair/3-in-1;With armrests (+2 total assist (pt=50%)) Stand to Sit Details: Assist for balance with controling eccentric descent to chair.  Cues for safest hand placement and slow descent.  Facillitation at sacrum. Exercises : not addressed  End of Session OT - End of Session Equipment Utilized During Treatment: Gait belt;Cervical collar Activity Tolerance: Patient limited by fatigue Patient left: in chair;with call bell in reach General Behavior During Session: [facility] for tasks performed (Simultaneous filing. User may not have seen previous data.) Cognition: [facility] for tasks performed (Simultaneous filing. User may not have seen previous data.)  Ricky Bradley  01/19/2011, 10:14 AM

## 2011-01-19 NOTE — Progress Notes (Signed)
This patient was discussed at long LOS rounds 12.05.12  

## 2011-01-19 NOTE — Progress Notes (Signed)
Subjective: No chest pain, no shortness of breath, no fever. Patient reports improve in his bladder discomfort and also improve in the pain on his back. There is just mild tingling sensation still on his feet/legs (bilaterally); otherwise he is feeling okay.  Objective: Vital signs in last 24 hours: Temp:  [97.2 F (36.2 C)-97.9 F (36.6 C)] 97.4 F (36.3 C) (12/06 1035) Pulse Rate:  [69-83] 81  (12/06 1035) Resp:  [16-18] 18  (12/06 1035) BP: (120-130)/(71-84) 122/81 mmHg (12/06 1035) SpO2:  [97 %-100 %] 97 % (12/06 1035) Weight change:  Last BM Date: 01/18/11  Intake/Output from previous day: 12/05 0701 - 12/06 0700 In: 1080 [P.O.:1080] Out: 4150 [Urine:4150]     Physical Exam: General: Alert, awake, oriented x3, in no acute distress. HEENT: No bruits, no goiter. Heart: Regular rate and rhythm, without murmurs, rubs, gallops. Lungs: Clear to auscultation bilaterally. Abdomen: Soft, mild tenderness in suprapubic region, nondistended, positive bowel sounds. Extremities: No clubbing cyanosis or edema with positive pedal pulses. Neuro: no new focal deficit.   Lab Results:  CBC:  Basename 01/19/11 0515 01/16/11 1650  WBC 8.6 17.9*  NEUTROABS -- --  HGB 13.0 14.9  HCT 37.1* 40.9  MCV 82.3 80.7  PLT 162 173   CBG:  Basename 01/17/11 1618  GLUCAP 200*    Misc. Labs:  Recent Results (from the past 240 hour(s))  URINE CULTURE     Status: Normal   Collection Time   01/15/11  1:38 PM      Component Value Range Status Comment   Specimen Description URINE, CATHETERIZED   Final    Special Requests NONE   Final    Setup Time [id number]   Final    Colony Count 85,000 COLONIES/ML   Final    Culture     Final    Value: STAPHYLOCOCCUS SPECIES (COAGULASE NEGATIVE)     Note: RIFAMPIN AND GENTAMICIN SHOULD NOT BE USED AS SINGLE DRUGS FOR TREATMENT OF STAPH INFECTIONS.   Report Status 01/18/2011 FINAL   Final    Organism ID, Bacteria STAPHYLOCOCCUS SPECIES (COAGULASE  NEGATIVE)   Final     Studies/Results: No results found.  Medications: Scheduled Meds:    . aspirin EC  81 mg Oral Daily  . ciprofloxacin  500 mg Oral BID  . citalopram  10 mg Oral Daily  . dexamethasone  8 mg Intravenous QID  . dicyclomine  10 mg Oral TID AC & HS  . docusate sodium  100 mg Oral BID  . fentaNYL  50 mcg Intravenous Once  . loratadine  10 mg Oral Daily  . LORazepam  1 mg Intravenous Once  . metoprolol succinate  12.5 mg Oral BID  . pantoprazole  40 mg Oral Daily  . polyethylene glycol  17 g Oral BID  . simethicone  80 mg Oral QID  . simvastatin  20 mg Oral q1800  . sodium chloride  3 mL Intravenous Q12H  . Tamsulosin HCl  0.4 mg Oral BID   Continuous Infusions:    . sodium chloride    . lactated ringers 75 mL/hr at 01/16/11 1300   PRN Meds:.acetaminophen, acetaminophen, acetaminophen, diazepam, gi cocktail, menthol-cetylpyridinium, morphine, ondansetron (ZOFRAN) IV, ondansetron (ZOFRAN) IV, ondansetron, oxyCODONE, phenol, polyvinyl alcohol, promethazine, sodium chloride, zolpidem, zolpidem  Assessment/Plan: 1-Cord compression: S/P surgical decompression; at this point plan is to continue decadron, continue pain medications and physical rehab. Will follow further recommendations from neurosurgery.  2-Hematuria: Improved. Will follow recommendations from urology. Hgb stable.  3-HTN (hypertension):stable and well controlled, continue current regimen.  4-Dyslipidemia:continue zocor.  5-BPH (benign prostatic hyperplasia): continue flomax  6-GERD (gastroesophageal reflux disease):Continue PPI.  7-Irritable bowel syndrome (IBS): continue bentyl and PRN simethicone.  8-UTI 2/2 staph: continue cipro day 3/10. Antibiotics started 01/17/11.  9-Depression: continue citalopram.  10-Leukocytosis: secondary to decadron most likely; no fever. Also UTI might be contributing some; will tx with ciprofloxacin.  11-DVT: SCD's.  12-hyperglycemia: No history of  diabetes, secondary to the use of Decadron. Will start sliding scale insulin while the patient is on steroids.     LOS: 11 days   Ricky Bradley 01/19/2011, 2:44 PM

## 2011-01-19 NOTE — Progress Notes (Signed)
Inpatient Diabetes Program Recommendations  AACE/ADA: New Consensus Statement on Inpatient Glycemic Control (2009)  Target Ranges:  Prepandial:   less than 140 mg/dL      Peak postprandial:   less than 180 mg/dL (1-2 hours)      Critically ill patients:  140 - 180 mg/dL   Reason for Visit: Lab glucose this AM was 214 mg/dL.  No previous history of diabetes noted.  Patient is on steroids. Consider checking CBG's tid wc.  Inpatient Diabetes Program Recommendations Correction (SSI): Consider adding Novolog correction moderate tid with meals while on steroids. HgbA1C: Consider to assess glycemic control.

## 2011-01-19 NOTE — Progress Notes (Signed)
Did well for the first therapy evaluation today. If continues tomorrow, will likely be admitted to inpatient acute rehabilitation tomorrow. Pager [phone]

## 2011-01-19 NOTE — Progress Notes (Signed)
Physical Therapy Evaluation Patient Details Name: Ricky Bradley MRN: [id number] DOB: November 26, 1933 Today's Date: 01/19/2011  Problem List:  Patient Active Problem List  Diagnoses  . HTN (hypertension)  . Dyslipidemia  . BPH (benign prostatic hyperplasia)  . GERD (gastroesophageal reflux disease)  . Lumbar stenosis with neurogenic claudication  . Cord compression  . Irritable bowel syndrome (IBS)  . Gross hematuria    Past Medical History:  Past Medical History  Diagnosis Date  . Hypertension   . GERD (gastroesophageal reflux disease)   . High cholesterol   . Enlarged prostate   . High cholesterol    Past Surgical History:  Past Surgical History  Procedure Date  . Joint replacement 2001  and 2002    both knees  . Laminectomy 08/2010  . Back surgery   . Replacement total knee   . Cholecystectomy 11/01/10  . Anterior cervical decomp/discectomy fusion 01/16/2011    Procedure: ANTERIOR CERVICAL DECOMPRESSION/DISCECTOMY FUSION 1 LEVEL;  Surgeon: Cristi Loron;  Location: [facility];  Service: Neurosurgery;  Laterality: N/A;  Cervical six-seven  Anterior Cervical Decomp[ression Fusion    PT Assessment/Plan/Recommendation PT Assessment Clinical Impression Statement: Pt is a 77 y/o male admitted s/p cervical fusion that presents with the below PT problem list.  Pt would benefit from acute PT to decrease burden of care while maximizing independence and facilitating d/c to CIR. PT Recommendation/Assessment: Patient will need skilled PT in the acute care venue PT Problem List: Decreased strength;Decreased activity tolerance;Decreased balance;Decreased mobility;Decreased coordination Barriers to Discharge: None PT Therapy Diagnosis : Difficulty walking;Abnormality of gait PT Plan PT Frequency: Min 3X/week PT Treatment/Interventions: DME instruction;Gait training;Functional mobility training;Therapeutic activities;Balance training;Neuromuscular re-education;Patient/family  education PT Recommendation Follow Up Recommendations: Inpatient Rehab Equipment Recommended: Defer to next venue PT Goals  Acute Rehab PT Goals PT Goal Formulation: With patient Time For Goal Achievement: 2 weeks Pt will Roll Supine to Left Side: with min assist PT Goal: Rolling Supine to Left Side - Progress: Other (comment) (Set today.) Pt will go Supine/Side to Sit: with min assist PT Goal: Supine/Side to Sit - Progress: Other (comment) (Set today.) Pt will Sit at [facility] of Bed: with supervision;1-2 min;with bilateral upper extremity support PT Goal: Sit at Edge Of Bed - Progress: Other (comment) (Set today.) Pt will go Sit to Supine/Side: with min assist PT Goal: Sit to Supine/Side - Progress: Other (comment) (Set today.) Pt will go Sit to Stand: with min assist PT Goal: Sit to Stand - Progress: Other (comment) (Set today.) Pt will go Stand to Sit: with min assist PT Goal: Stand to Sit - Progress: Other (comment) (Set today.) Pt will Ambulate: 16 - 50 feet;with min assist;with least restrictive assistive device PT Goal: Ambulate - Progress: Other (comment) (Set today.)  PT Evaluation Precautions/Restrictions  Precautions Precautions: Fall;Other (comment) (Cervical precautions.) Precaution Comments: Cervical precautions Required Braces or Orthoses: Yes Cervical Brace: Hard collar;Applied in supine position Restrictions Weight Bearing Restrictions: No Prior Functioning  Home Living Lives With: Spouse Receives Help From: Family;Other (Comment) (PRN.  Son lives down the street.) Type of Home: House Home Layout: One level Home Access: Stairs to enter Entrance Stairs-Rails: Left Entrance Stairs-Number of Steps: 3 Home Adaptive Equipment: Bedside commode/3-in-1;Walker - rolling;Tub transfer bench;Straight cane Prior Function Level of Independence: Independent with basic ADLs;Independent with homemaking with ambulation;Requires assistive device for independence;Independent with  transfers Able to Take Stairs?: Yes Cognition Cognition Arousal/Alertness: Awake/alert Overall Cognitive Status: Appears within functional limits for tasks assessed Orientation Level: Oriented X4 Sensation/Coordination  Sensation Light Touch: Appears Intact Stereognosis: Not tested Hot/Cold: Not tested Proprioception: Not tested Coordination Gross Motor Movements are Fluid and Coordinated: No Fine Motor Movements are Fluid and Coordinated: Not tested Coordination and Movement Description: Some ataxia/apraxia in bilateral LEs.  Decreased propioception of trunk at EOB contributing to decreased balance. Extremity Assessment RUE Assessment RUE Assessment: Within Functional Limits LUE Assessment LUE Assessment: Within Functional Limits RLE Assessment RLE Assessment: Exceptions to [facility] RLE Strength RLE Overall Strength: Deficits RLE Overall Strength Comments: 4/5 Right Hip Flexion: 2-/5 Right Ankle Dorsiflexion: 1/5 LLE Assessment LLE Assessment: Exceptions to [facility] LLE Strength LLE Overall Strength: Deficits LLE Overall Strength Comments: 4/5 Left Hip Flexion: 3-/5 Left Ankle Dorsiflexion: 2-/5 Pain 0/10 with treatment. Mobility (including Balance) Bed Mobility Bed Mobility: Yes Rolling Left: 1: +2 Total assist;Patient percentage (comment) (+2 total assist (pt=40%)) Rolling Left Details (indicate cue type and reason): Assist to facilitate flexion of bilateral LEs and rotation of trunk.  Cues for sequence. Left Sidelying to Sit: 1: +2 Total assist;Patient percentage (comment);HOB flat (+2 total assist (pt=40%)) Left Sidelying to Sit Details (indicate cue type and reason): Assist to translate trunk to midline as well as manuever bilateral LEs off of EOB with cues for sequence. Transfers Transfers: Yes Sit to Stand: 1: +2 Total assist (pt=50% Simultaneous filing. User may not have seen previous data.) Sit to Stand Details (indicate cue type and reason): min v.c. foot placement/  hand position (Simultaneous filing. User may not have seen previous data.) Stand to Sit: 1: +2 Total assist;Patient percentage (comment);To chair/3-in-1;With armrests (+2 total assist (pt=50%)) Stand to Sit Details: Assist for balance with controling eccentric descent to chair.  Cues for safest hand placement and slow descent.  Facilitation at sacrum. Ambulation/Gait Ambulation/Gait: Yes Ambulation/Gait Assistance: 1: +2 Total assist;Patient percentage (comment) (+2 total assist (pt=50%)) Ambulation/Gait Assistance Details (indicate cue type and reason): Assist for balance and sequence with cues for advancement of bilateral LEs and trunk extension.  Facilitation at sacrum to extend trunk and assist weight shifting left/right for bilateral LE advancement. Ambulation Distance (Feet): 2 Feet Assistive device: Rolling walker Gait Pattern: Decreased step length - right;Decreased step length - left;Decreased dorsiflexion - right;Decreased weight shift to right;Decreased weight shift to left;Trunk flexed;Shuffle Stairs: No Corporate treasurer: No  Posture/Postural Control Posture/Postural Control: No significant limitations Balance Balance Assessed: Yes Static Sitting Balance Static Sitting - Balance Support: Bilateral upper extremity supported;Feet supported Static Sitting - Level of Assistance: 1: +1 Total assist (Up to total assist, but able to progress to min (guard)) Static Sitting - Comment/# of Minutes: 20 (Able to progress to min (guard) for 2-3 minutes.) Exercise    End of Session PT - End of Session Equipment Utilized During Treatment: Gait belt;Cervical collar Activity Tolerance: Patient limited by fatigue Patient left: in chair;with call bell in reach;with family/visitor present Nurse Communication: Mobility status for transfers General Behavior During Session: [facility] for tasks performed (Simultaneous filing. User may not have seen previous data.) Cognition: [facility]  for tasks performed (Simultaneous filing. User may not have seen previous data.) Co-treatment with OT.  Norbert Malkin M 01/19/2011, 10:15 AM  01/19/2011 Cephus Shelling, PT, DPT [phone]

## 2011-01-19 NOTE — Progress Notes (Signed)
I continue to await patient's ability to participate in his initial P.T. Evaluation postop. To determine his ability for CIR. His bladder issues have been his limiting factor. I will follow up with his progress today. Please call me with any questions. Pager [phone].

## 2011-01-20 LAB — HEMOGLOBIN A1C
Hgb A1c MFr Bld: 6.4 % — ABNORMAL HIGH (ref <5.7)
Mean Plasma Glucose: 137 mg/dL — ABNORMAL HIGH (ref <117)

## 2011-01-20 LAB — GLUCOSE, CAPILLARY
Glucose-Capillary: 202 mg/dL — ABNORMAL HIGH (ref 70–99)
Glucose-Capillary: 189 mg/dL — ABNORMAL HIGH (ref 70–99)
Glucose-Capillary: 263 mg/dL — ABNORMAL HIGH (ref 70–99)

## 2011-01-20 MED ORDER — CEPHALEXIN 500 MG PO CAPS
500.0000 mg | ORAL_CAPSULE | Freq: Four times a day (QID) | ORAL | Status: DC
  Administered 2011-01-20 – 2011-01-23 (×13): 500 mg via ORAL
  Filled 2011-01-20 (×20): qty 1

## 2011-01-20 NOTE — Progress Notes (Signed)
Noted Urology update this morning with patient still having gross hematuria and need for increasing irrigation to  every 4 hours.   I have discussed with Dr. Riley Kill.  We will not admit today. I will follow up on Monday with hematuria issues to determine timing of admit to CIR. Please call me with any questions. Pager [phone].

## 2011-01-20 NOTE — Progress Notes (Signed)
4 Days Post-Op Subjective: Ricky Bradley is having no pain. He does state that when his catheter is irrigated, nurses have not been aspirating, just pushing and with the syringe.  Objective: Vital signs in last 24 hours: Temp:  [97.4 F (36.3 C)-97.7 F (36.5 C)] 97.5 F (36.4 C) (12/07 0200) Pulse Rate:  [65-81] 65  (12/07 0200) Resp:  [18] 18  (12/07 0200) BP: (122-144)/(70-81) 131/77 mmHg (12/07 0200) SpO2:  [95 %-98 %] 96 % (12/07 0200)  Intake/Output from previous day: 12/06 0701 - 12/07 0700 In: 510 [P.O.:360] Out: 4200 [Urine:4200] Intake/Output this shift:    Physical Exam:  The urine is still bloody.  Lab Results:  Basename 01/19/11 0515  HGB 13.0  HCT 37.1*    Assessment/Plan: He is still having gross hematuria. Unfortunately, it does not appear that his catheter is being irrigated appropriately. I irrigated using 1000 cc of water today. I got a fair amount of clot out. To me, it is obvious that this has not been adequately irrigated.  I would strongly recommend that the nurses irrigate/aspirate in and out every 4 hours. Once we have the clots fully urinated out, I would expect that the bleeding would stop. For now, we've the 16 catheter in. I have left an order that if his catheter is not adequately draining, that a larger three-way catheter be put in and hooked to CBI. We will follow him over the weekend.   Ricky Millard. Mycal Conde, MD  01/20/2011, 7:37 AM

## 2011-01-20 NOTE — Progress Notes (Signed)
Pts foley irrigated at 1025. A total of 500cc of saline was used. Aspirate had few clots and urine was red but clear. Will continue to flush on a q4 basis.

## 2011-01-20 NOTE — Progress Notes (Signed)
Subjective: No chest pain, no shortness of breath, no fever. Patient is still with some hematuria but urine is slowly clearing up.  Objective: Vital signs in last 24 hours: Temp:  [97.3 F (36.3 C)-98 F (36.7 C)] 97.3 F (36.3 C) (12/07 1020) Pulse Rate:  [65-77] 77  (12/07 1020) Resp:  [18] 18  (12/07 1020) BP: (125-144)/(70-81) 137/78 mmHg (12/07 1020) SpO2:  [95 %-99 %] 99 % (12/07 1020) Weight change:  Last BM Date: 01/18/11  Intake/Output from previous day: 12/06 0701 - 12/07 0700 In: 510 [P.O.:360] Out: 4200 [Urine:4200] Total I/O In: 600 [P.O.:600] Out: 1025 [Urine:1025]   Physical Exam: General: Alert, awake, oriented x3, in no acute distress. HEENT: No bruits, no goiter. Heart: Regular rate and rhythm, without murmurs, rubs, gallops. Lungs: Clear to auscultation bilaterally. Abdomen: Soft, mild tenderness in suprapubic region, nondistended, positive bowel sounds. Extremities: No clubbing cyanosis or edema with positive pedal pulses. Neuro: no new focal deficit.   Lab Results:  CBC:  Basename 01/19/11 0515  WBC 8.6  NEUTROABS --  HGB 13.0  HCT 37.1*  MCV 82.3  PLT 162   CBG:  Basename 01/20/11 1152 01/20/11 0657 01/19/11 2210 01/19/11 1753 01/17/11 1618  GLUCAP 189* 202* 217* 254* 200*    Misc. Labs:  Recent Results (from the past 240 hour(s))  URINE CULTURE     Status: Normal   Collection Time   01/15/11  1:38 PM      Component Value Range Status Comment   Specimen Description URINE, CATHETERIZED   Final    Special Requests NONE   Final    Setup Time [id number]   Final    Colony Count 85,000 COLONIES/ML   Final    Culture     Final    Value: STAPHYLOCOCCUS SPECIES (COAGULASE NEGATIVE)     Note: RIFAMPIN AND GENTAMICIN SHOULD NOT BE USED AS SINGLE DRUGS FOR TREATMENT OF STAPH INFECTIONS.   Report Status 01/18/2011 FINAL   Final    Organism ID, Bacteria STAPHYLOCOCCUS SPECIES (COAGULASE NEGATIVE)   Final     Studies/Results: No results  found.  Medications: Scheduled Meds:    . aspirin EC  81 mg Oral Daily  . cephALEXin  500 mg Oral Q6H  . citalopram  10 mg Oral Daily  . dexamethasone  8 mg Intravenous QID  . dicyclomine  10 mg Oral TID AC & HS  . docusate sodium  100 mg Oral BID  . fentaNYL  50 mcg Intravenous Once  . insulin aspart  0-15 Units Subcutaneous TID WC  . insulin aspart  0-5 Units Subcutaneous QHS  . loratadine  10 mg Oral Daily  . LORazepam  1 mg Intravenous Once  . metoprolol succinate  12.5 mg Oral BID  . pantoprazole  40 mg Oral Daily  . polyethylene glycol  17 g Oral BID  . simethicone  80 mg Oral QID  . simvastatin  20 mg Oral q1800  . sodium chloride  3 mL Intravenous Q12H  . Tamsulosin HCl  0.4 mg Oral BID  . DISCONTD: ciprofloxacin  500 mg Oral BID   Continuous Infusions:    . sodium chloride    . lactated ringers 75 mL/hr at 01/16/11 1300   PRN Meds:.acetaminophen, acetaminophen, acetaminophen, diazepam, gi cocktail, menthol-cetylpyridinium, morphine, ondansetron (ZOFRAN) IV, ondansetron (ZOFRAN) IV, ondansetron, oxyCODONE, phenol, polyvinyl alcohol, promethazine, sodium chloride, zolpidem, zolpidem  Assessment/Plan: 1-Cord compression: S/P surgical decompression; at this point plan is to continue decadron, continue pain medications and physical  rehab. Will follow further recommendations from neurosurgery.  2-Hematuria: Improved. Will follow recommendations from urology. Hgb stable. Will check CBC in a.m.  3-HTN (hypertension):stable and well controlled, continue current regimen.  4-Dyslipidemia:continue zocor.  5-BPH (benign prostatic hyperplasia): continue flomax  6-GERD (gastroesophageal reflux disease):Continue PPI.  7-Irritable bowel syndrome (IBS): continue bentyl and PRN simethicone.  8-UTI 2/2 staph: Base on urine culture will adjust antibiotics to Keflex 500 mg by mouth 4 times a day. Day 4/10 (total antibiotics)  9-Depression: continue  citalopram.  10-Leukocytosis: Second to steroids and also UTI.   11-DVT: SCD's.  12-hyperglycemia: Secondary to steroids. Continue sliding scale insulin while receiving decadron. A1c 6.4. No treatment require for this glucose intolerance Results. Further assessment and followup to be done by PCP as an outpatient.     LOS: 12 days   Jaquelyn Sakamoto 01/20/2011, 1:31 PM

## 2011-01-20 NOTE — Progress Notes (Signed)
Foley irrigated with 500cc saline at 1435. Aspirate had moderate small clots. Aspirate at end of procedure was pink in color and clear.

## 2011-01-20 NOTE — Progress Notes (Signed)
Physical Therapy Treatment Patient Details Name: Ricky Bradley MRN: [id number] DOB: 11/21/1933 Today's Date: 01/20/2011  PT Assessment/Plan  PT - Assessment/Plan Comments on Treatment Session: Limited today by fatigue. PT Plan: Discharge plan remains appropriate;Frequency needs to be updated PT Frequency: Min 5X/week Follow Up Recommendations: Inpatient Rehab Equipment Recommended: Defer to next venue PT Goals  Acute Rehab PT Goals PT Goal Formulation: With patient Time For Goal Achievement: 2 weeks Pt will Roll Supine to Left Side: with min assist PT Goal: Rolling Supine to Left Side - Progress: Progressing toward goal Pt will go Supine/Side to Sit: with min assist PT Goal: Supine/Side to Sit - Progress: Progressing toward goal Pt will Sit at Edge of Bed: with supervision;1-2 min;with bilateral upper extremity support PT Goal: Sit at Edge Of Bed - Progress: Progressing toward goal Pt will go Sit to Supine/Side: with min assist PT Goal: Sit to Supine/Side - Progress: Progressing toward goal Pt will go Sit to Stand: with min assist PT Goal: Sit to Stand - Progress: Progressing toward goal Pt will go Stand to Sit: with min assist PT Goal: Stand to Sit - Progress: Progressing toward goal Pt will Ambulate: 16 - 50 feet;with min assist;with least restrictive assistive device PT Goal: Ambulate - Progress: Progressing toward goal  PT Treatment Precautions/Restrictions  Precautions Precautions: Fall;Other (comment) (Cervical precautions.) Precaution Comments: Cervical precautions Required Braces or Orthoses: Yes Cervical Brace: Hard collar;Applied in supine position Restrictions Weight Bearing Restrictions: No Pain 0/10 with treatment. Mobility (including Balance) Bed Mobility Bed Mobility: Yes Rolling Left: 1: +2 Total assist;Patient percentage (comment) (+2 total assist (pt=50%)) Rolling Left Details (indicate cue type and reason): Assist to flexion of bilateral LEs with  facilitation at trunk for rotation.  Cues throughout for sequence. Left Sidelying to Sit: 1: +2 Total assist;Patient percentage (comment);HOB flat (+2 total assist (pt=50%)) Left Sidelying to Sit Details (indicate cue type and reason): Assist for trunk to achieve midline with faciltation to bilateral LEs for off EOB.  Cues for sequence. Transfers Transfers: Yes Sit to Stand: 1: +2 Total assist;Patient percentage (comment);From bed;With upper extremity assist (+2 total assist (pt=50%); 2 trials.) Sit to Stand Details (indicate cue type and reason): Assist at sacrum for anterior translation with cues for hand placement and extension at hips/knees. Stand to Sit: 1: +2 Total assist;Patient percentage (comment);To chair/3-in-1;With upper extremity assist;To bed (+2 total assist (pt=50%); 2 trials.) Stand to Sit Details: Assist for eccentric descent to chair with cues for hand placement and slow contraction. Ambulation/Gait Ambulation/Gait: Yes Ambulation/Gait Assistance: 1: +2 Total assist;Patient percentage (comment) (+2 total assist (pt=40%)) Ambulation/Gait Assistance Details (indicate cue type and reason): Assist for balance with facilitation at sacrum to increase hip extension while blocking bilateral knees to prevent flexion.  Occasional hyperextension of left knee. Ambulation Distance (Feet): 2 Feet Assistive device: 2 person hand held assist Gait Pattern: Decreased step length - right;Decreased step length - left;Decreased dorsiflexion - right;Decreased weight shift to right;Decreased weight shift to left;Trunk flexed;Shuffle Stairs: No Corporate treasurer: No  Posture/Postural Control Posture/Postural Control: No significant limitations Balance Balance Assessed: Yes Static Sitting Balance Static Sitting - Balance Support: Bilateral upper extremity supported;Feet supported Static Sitting - Level of Assistance: 3: Mod assist (Pt progressed quickly to min (guard)  today.) Static Sitting - Comment/# of Minutes: 10 Exercise    End of Session PT - End of Session Equipment Utilized During Treatment: Gait belt;Cervical collar Activity Tolerance: Patient limited by fatigue Patient left: in chair;with call bell in reach Nurse  Communication: Mobility status for transfers General Behavior During Session: [facility] for tasks performed Cognition: [facility] for tasks performed  Cephus Shelling 01/20/2011, 1:49 PM  01/20/2011 Cephus Shelling, PT, DPT [phone]

## 2011-01-20 NOTE — Progress Notes (Signed)
   CARE MANAGEMENT NOTE 01/20/2011  Patient:  Ricky Bradley, Ricky Bradley   Account Number:  [id number]  Date Initiated:  01/09/2011  Documentation initiated by:  Junius Creamer  Subjective/Objective Assessment:   adm w weakness     Action/Plan:   lives w wife, pcp dr Domenick Bookbinder   Anticipated DC Date:  01/11/2011   Anticipated DC Plan:  HOME W HOME HEALTH SERVICES      DC Planning Services  CM consult      Choice offered to / List presented to:             Status of service:  In process, will continue to follow Medicare Important Message given?   (If response is "NO", the following Medicare IM given date fields will be blank) Date Medicare IM given:   Date Additional Medicare IM given:    Discharge Disposition:    Per UR Regulation:  Reviewed for med. necessity/level of care/duration of stay  Comments:  01/20/2011 Onnie Boer, RN, BSN 1645 PT CONT WITH Q 4 HR BLADDER IRRIGATIONS, PT SHOULD DC TO CIR ON [facility]   01/17/2011 1530 Utilization review complete. Isidoro Donning RN CCM Case Mgmt phone [phone]   01/12/11 1330 Verdis Prime RN MSN CCM Pt scheduled for surgery on Monday, 12/3.  11/26 debbie dowell rn,bsn [phone]

## 2011-01-20 NOTE — PMR Pre-admission (Signed)
PMR Admission Coordinator Pre-Admission Assessment  Patient:  Ricky Bradley is an 77 y.o., male MRN:  [id number] DOB:  Oct 15, 1933 Height:  Height: 6' (182.9 cm) Weight:  Weight: 81.466 kg (179 lb 9.6 oz)  Insurance Information: HMO:   PPO:     PCP:     IPA:     80/20:yes     OTHER: PRIMARY:Medicare a and b      Policy#:[id number]      Subscriber:pt Benefits:  Phone #:visionshare 01/20/11     Name:automated Eff. Date:08/14/10     Deduct:$1156      Out of Pocket WUJ:WJXB      Life JYN:WGNF CIR:100%      SNF:20 full days LBD 08/27/10 Outpatient:80%     Co-Pay:20% Home Health:100%      Co-Pay:none DME:80%     Co-Pay:20% Providers:pt choice SECONDARY:[employer] supplement      Policy#:[id number]      Subscriber:pt Approval not needed with medicare primary  Current Medical History:   Patient Admitting Diagnosis:  C6/C7 stenosis with myelopathy status post decompression  History of Present Illness: Admitted 01/08/11 with bilateral lower extremity weakness progressive in nature. Previous lumbar laminectomy for spinal stenosis 7/12. 12/3 patient underwent C6-7 acdf. Postop with urinary retention with history of BPH. Urology consulted with foley placed and bladder irrigations due to hematuria. Placed on antibiotics for positive urine cultures.  Patients Past Medical History:   Past Medical History  Diagnosis Date  . Hypertension   . GERD (gastroesophageal reflux disease)   . High cholesterol   . Enlarged prostate   . High cholesterol    Family Medical History:  family history is not on file. Prior Rehab/Hospitalizations: none  Medications  Current Medications: Current facility-administered medications:0.9 %  sodium chloride infusion, 250 mL, Intravenous, Continuous, Cristi Loron, Last Rate: 1 mL/hr at 01/22/11 1427, 500 mL at 01/22/11 1427;  acetaminophen (TYLENOL) suppository 650 mg, 650 mg, Rectal, Q6H PRN, Skip Estimable;  acetaminophen (TYLENOL) suppository 650 mg, 650 mg,  Rectal, Q4H PRN, Cristi Loron;  acetaminophen (TYLENOL) tablet 650 mg, 650 mg, Oral, Q6H PRN, Skip Estimable alum & mag hydroxide-simeth (MAALOX/MYLANTA) 200-200-20 MG/5ML suspension 30 mL, 30 mL, Oral, Q4H PRN, Simbiso Ranga;  aspirin EC tablet 81 mg, 81 mg, Oral, Daily, Lourdes Sledge Potter, PHARMD, 81 mg at 01/22/11 1029;  cephALEXin (KEFLEX) capsule 500 mg, 500 mg, Oral, Q6H, Vassie Loll, MD, 500 mg at 01/23/11 0740;  citalopram (CELEXA) tablet 10 mg, 10 mg, Oral, Daily, Skip Estimable, 10 mg at 01/22/11 1028 dexamethasone (DECADRON) injection 8 mg, 8 mg, Intravenous, QID, Stasia Cavalier, RPH, 8 mg at 01/22/11 2255;  diazepam (VALIUM) tablet 5 mg, 5 mg, Oral, Q6H PRN, Cristi Loron, 5 mg at 01/21/11 0901;  dicyclomine (BENTYL) capsule 10 mg, 10 mg, Oral, TID AC & HS, Skip Estimable, 10 mg at 01/22/11 2254;  docusate sodium (COLACE) capsule 100 mg, 100 mg, Oral, BID, Cristi Loron, 100 mg at 01/22/11 2255 fentaNYL (SUBLIMAZE) injection 50 mcg, 50 mcg, Intravenous, Once, Judie Petit, MD;  gi cocktail, 30 mL, Oral, BID PRN, Christina Rama, 30 mL at 01/14/11 1806;  insulin aspart (novoLOG) injection 0-15 Units, 0-15 Units, Subcutaneous, TID WC, Vassie Loll, MD, 11 Units at 01/22/11 1640;  insulin aspart (novoLOG) injection 0-5 Units, 0-5 Units, Subcutaneous, QHS, Vassie Loll, MD, 2 Units at 01/22/11 2254 insulin glargine (LANTUS) injection 8 Units, 8 Units, Subcutaneous, Q0700, Simbiso Ranga, 8 Units at 01/23/11 0740;  lactated  ringers infusion, , Intravenous, Continuous, Simbiso Ranga, Last Rate: 20 mL/hr at 01/22/11 2330;  loratadine (CLARITIN) tablet 10 mg, 10 mg, Oral, Daily, Michelle A. Matthews, 10 mg at 01/22/11 1028;  LORazepam (ATIVAN) injection 1 mg, 1 mg, Intravenous, Once, Skip Estimable menthol-cetylpyridinium (CEPACOL) lozenge 3 mg, 1 lozenge, Oral, PRN, Cristi Loron;  methocarbamol (ROBAXIN) tablet 500 mg, 500 mg, Oral, Q6H PRN, Vassie Loll,  MD;  metoprolol succinate (TOPROL-XL) 24 hr tablet 12.5 mg, 12.5 mg, Oral, BID, Christina Rama, 12.5 mg at 01/22/11 2255;  morphine 4 MG/ML injection 1-4 mg, 1-4 mg, Intravenous, Q3H PRN, Cristi Loron, 4 mg at 01/22/11 2256 ondansetron (ZOFRAN) injection 4 mg, 4 mg, Intravenous, Q6H PRN, Skip Estimable;  ondansetron [facility]) injection 4 mg, 4 mg, Intravenous, Q4H PRN, Cristi Loron;  ondansetron [facility]) tablet 4 mg, 4 mg, Oral, Q6H PRN, Skip Estimable, 4 mg at 01/13/11 1458;  oxyCODONE (Oxy IR/ROXICODONE) immediate release tablet 5 mg, 5 mg, Oral, Q4H PRN, Skip Estimable, 5 mg at 01/20/11 1821 pantoprazole (PROTONIX) EC tablet 40 mg, 40 mg, Oral, BID AC, Simbiso Ranga, 40 mg at 01/22/11 1905;  phenol (CHLORASEPTIC) mouth spray 1 spray, 1 spray, Mouth/Throat, PRN, Cristi Loron;  polyethylene glycol (MIRALAX / GLYCOLAX) packet 17 g, 17 g, Oral, BID, Shanker Ghimire, 17 g at 01/22/11 2315;  polyvinyl alcohol (LIQUIFILM TEARS) 1.4 % ophthalmic solution 1 drop, 1 drop, Left Eye, Q4H PRN, Michelle A. Ashley Royalty, 1 drop at 01/16/11 1628 promethazine (PHENERGAN) injection 6.25-12.5 mg, 6.25-12.5 mg, Intravenous, Q15 min PRN, Judie Petit, MD;  simethicone [facility]) chewable tablet 80 mg, 80 mg, Oral, QID, Cristi Loron, 80 mg at 01/22/11 2254;  simvastatin (ZOCOR) tablet 20 mg, 20 mg, Oral, q1800, Skip Estimable, 20 mg at 01/22/11 1639;  sodium chloride 0.9 % injection 3 mL, 3 mL, Intravenous, Q12H, Cristi Loron, 3 mL at 01/22/11 2310 sodium chloride 0.9 % injection 3 mL, 3 mL, Intravenous, PRN, Cristi Loron;  Tamsulosin HCl (FLOMAX) capsule 0.4 mg, 0.4 mg, Oral, BID, Jacqulyn Ducking. Cammarata, 0.4 mg at 01/22/11 2254;  zolpidem (AMBIEN) tablet 5 mg, 5 mg, Oral, QHS PRN, Gery Pray, MD, 5 mg at 01/21/11 2138;  zolpidem (AMBIEN) tablet 5 mg, 5 mg, Oral, QHS PRN, Cristi Loron, 5 mg at 01/22/11 2314 DISCONTD: pantoprazole (PROTONIX) EC tablet 40 mg, 40 mg, Oral,  Daily, Skip Estimable, 40 mg at 01/22/11 1028  Precautions/Special Needs:    Additional Precautions/Restrictions: Precautions Precautions: Fall;Other (comment) (Cervical precautions.) Precaution Comments: Cervical precautions Required Braces or Orthoses: Yes Cervical Brace: Hard collar;Applied in supine position Restrictions Weight Bearing Restrictions: No  Therapy Assessments Cognition Arousal/Alertness: Awake/alert Overall Cognitive Status: Appears within functional limits for tasks assessed Orientation Level: Oriented X4   Home Living Lives With: Spouse Receives Help From: Family;Other (Comment) (PRN.  Son lives down the street.) Type of Home: House Home Layout: One level Home Access: Stairs to enter Entrance Stairs-Rails: Left Entrance Stairs-Number of Steps: 3 Bathroom Shower/Tub: Engineer, manufacturing systems: Standard Bathroom Accessibility: Yes Home Adaptive Equipment: Bedside commode/3-in-1;Walker - rolling;Tub transfer bench;Straight cane   Sensation Light Touch: Appears Intact Stereognosis: Not tested Hot/Cold: Not tested Proprioception: Not tested Cognition Arousal/Alertness: Awake/alert Overall Cognitive Status: Appears within functional limits for tasks assessed Orientation Level: Oriented X4 Coordination Gross Motor Movements are Fluid and Coordinated: No Fine Motor Movements are Fluid and Coordinated: Not tested Coordination and Movement Description: Some ataxia/apraxia in bilateral LEs.  Decreased propioception of trunk at  EOB contributing to decreased balance.  Prior Function: Prior Function Level of Independence: Independent with basic ADLs;Independent with homemaking with ambulation;Requires assistive device for independence;Independent with transfers Able to Take Stairs?: Yes Vocation: Retired  ADLs/Mobility:Current Functional Level: ADL Eating/Feeding: Performed;Modified independent Where Assessed - Eating/Feeding: Chair Grooming:  Performed;Wash/dry face;Set up;Moderate assistance Where Assessed - Grooming: Sitting, bed (mod-total assist for balance edge of bed) Upper Body Bathing: Simulated;Supervision/safety Where Assessed - Upper Body Bathing: Sitting, chair;Supported Lower Body Bathing: Simulated;Moderate assistance Where Assessed - Lower Body Bathing: Sit to stand from bed Upper Body Dressing: Simulated;Supervision/safety Where Assessed - Upper Body Dressing: Sitting, bed Lower Body Dressing: +2 Total assistance Where Assessed - Lower Body Dressing: Sitting, bed Toilet Transfer: Simulated;Moderate assistance Toilet Transfer Method: Stand pivot Toilet Transfer Equipment:  (Bedside chair) Toileting - Clothing Manipulation: Simulated;Moderate assistance Where Assessed - Toileting Clothing Manipulation:  (Sit to stand from bedside chair.) Toileting - Hygiene: Simulated Where Assessed - Toileting Hygiene:  (Sit to stand from chair) Tub/Shower Transfer: Not assessed Tub/Shower Transfer Method: Not assessed Equipment Used: Rolling walker ADL Comments: Pt overall needs mod to max assist for selfcare and toileting tasks.  Will benefit from AE instruction secondary to exhibiting decreased flexibility and having cervical precautions to follow.  Mod assist for sit to stand throughout session.  Bed Mobility Bed Mobility: Yes Rolling Left: 1: +2 Total assist;Patient percentage (comment) (+2 total assist (pt=50%)) Rolling Left Details (indicate cue type and reason): Assist to flexion of bilateral LEs with facilitation at trunk for rotation.  Cues throughout for sequence. Left Sidelying to Sit: 1: +2 Total assist;Patient percentage (comment);HOB flat (+2 total assist (pt=50%)) Left Sidelying to Sit Details (indicate cue type and reason): Assist for trunk to achieve midline with faciltation to bilateral LEs for off EOB.  Cues for sequence. Transfers Transfers: Yes Sit to Stand: 1: +2 Total assist;Patient percentage  (comment);From bed;With upper extremity assist (+2 total assist (pt=50%); 2 trials.) Sit to Stand Details (indicate cue type and reason): Assist at sacrum for anterior translation with cues for hand placement and extension at hips/knees. Stand to Sit: 1: +2 Total assist;Patient percentage (comment);To chair/3-in-1;With upper extremity assist;To bed (+2 total assist (pt=50%); 2 trials.) Stand to Sit Details: Assist for eccentric descent to chair with cues for hand placement and slow contraction. Ambulation/Gait Ambulation/Gait: Yes Ambulation/Gait Assistance: 1: +2 Total assist;Patient percentage (comment) (+2 total assist (pt=40%)) Ambulation/Gait Assistance Details (indicate cue type and reason): Assist for balance with facilitation at sacrum to increase hip extension while blocking bilateral knees to prevent flexion.  Occasional hyperextension of left knee. Ambulation Distance (Feet): 2 Feet Assistive device: 2 person hand held assist Gait Pattern: Decreased step length - right;Decreased step length - left;Decreased dorsiflexion - right;Decreased weight shift to right;Decreased weight shift to left;Trunk flexed;Shuffle Stairs: No Corporate treasurer: No Posture/Postural Control Posture/Postural Control: No significant limitations Balance Balance Assessed: Yes Static Sitting Balance Static Sitting - Balance Support: Bilateral upper extremity supported;Feet supported Static Sitting - Level of Assistance: 3: Mod assist (Pt progressed quickly to min (guard) today.) Static Sitting - Comment/# of Minutes: 10  Home Assistive Devices/Equipment:  Home Assistive Devices/Equipment Home Assistive Devices/Equipment: Walker (specify type);Eyeglasses  Discharge Planning:  Discharge Planning Living Arrangements: Spouse/significant other Support Systems: Spouse/significant other Assistance Needed: none Do you have any problems obtaining your medications?: No Type of Residence:  Private residence Home Care Services: No Patient expects to be discharged to:: home Case Management Consult Needed: No Yes  Prior Functional Levels:  Prior Functional Level  Bed Mobility: was independent prior to a few days of admit. Laminectomy 7/12. progressiviel weaker  Previous Home Environment:  Previous Investment banker, corporate: Spouse/significant other Support Systems: Spouse/significant other Assistance Needed: none Do you have any problems obtaining your medications?: No Type of Residence: Private residence Home Care Services: No Patient expects to be discharged to:: home Home Environment Number of Levels:  (one level) Previous Home Environment Number of Steps: 3 steps Previous Home Environment Is Bedroom on Main Floor?: Yes Previous Home Environment Is Bathroom on Main Floor?: Yes  Discharge Living Setting:  Discharge Living Setting Plans for Discharge Living Setting: Patient's home Discharge Living Setting Number of Levels: one level Discharge Living Setting Number of Steps: 3 steps Discharge Living Setting is Bedroom on Main Floor?: Yes  Social/Family/Support Systems:  Social/Family/Support Systems Patient Roles: Spouse;Parent Contact Information: Scarlette Calico Anticipated Caregiver:  [place] Anticipated Industrial/product designer Information: [phone] Ability/Limitations of Caregiver: supervision only.Could not lift Caregiver Availability: 24/7 Discharge Plan Discussed with Primary Caregiver: Yes Is Caregiver In Agreement with Plan?: Yes Does Caregiver/Family have Issues with Lodging/Transportation while Pt is in Rehab?: No Daughter is Jenelle Mages cell [phone] Son is Rei Medlen cell [phone]. Both are aware that depending on his progress is he d/cs home vs needs continued rehab at [facility].  Goals/Additional Needs:  Goals/Additional Needs Patient/Family Goal for Rehab: supervision to Mod I P.T., Mod I to set up with O.T. Pt/Family Agrees to Admission and  willing to participate: Yes Program Orientation Provided & Reviewed with Pt/Caregiver Including Roles  & Responsibilities: Yes Additional Information Needs: I met with son and daughter. If he does not progress as expected, have discussed further SNF rehab  Preadmission Screen Completed By:  Clois Dupes, 01/23/2011 9:06 AM  Patient's condition:  Please see physician update to information in consult dated 01/17/2011.  Preadmission Screen Competed by:Ottie Glazier, RN, Time/Date, 01/23/11 at [id number].  Discussed status with Dr. Riley Kill on 01/23/11 at 0911 and received telephone approval for admission today.  Admission Coordinator:  Clois Dupes, time 6962 /Date12/10/12.  Marland Kitchen

## 2011-01-20 NOTE — Progress Notes (Signed)
Patient ID: Ricky Bradley, male   DOB: 1933/08/31, 77 y.o.   MRN: [id number] Subjective:  The patient is alert and pleasant. He looks well. He wants to go to rehabilitation. He continues to have some weakness in his right dorsiflexors.  Objective: Vital signs in last 24 hours: Temp:  [97.4 F (36.3 C)-97.7 F (36.5 C)] 97.5 F (36.4 C) (12/07 0200) Pulse Rate:  [65-81] 65  (12/07 0200) Resp:  [18] 18  (12/07 0200) BP: (122-144)/(70-81) 131/77 mmHg (12/07 0200) SpO2:  [95 %-98 %] 96 % (12/07 0200)  Intake/Output from previous day: 12/06 0701 - 12/07 0700 In: 510 [P.O.:360] Out: 4200 [Urine:4200] Intake/Output this shift:    Physical exam the patient is alert and oriented. His motor strength is grossly normal except his right EHL and dorsiflexors. His incision is healing well.  Lab Results:  [facility] 01/19/11 0515  WBC 8.6  HGB 13.0  HCT 37.1*  PLT 162   BMET  Basename 01/19/11 0515  NA 131*  K 4.3  CL 95*  CO2 29  GLUCOSE 214*  BUN 28*  CREATININE 0.77  CALCIUM 8.3*    Studies/Results: No results found.  Assessment/Plan: Postop day #4: The patient is doing well. He is ready for rehabilitation. He will possibly be transferred there later today.  Peroneal neuropathy: This will likely resolve with time.  LOS: 12 days     Tanae Petrosky D 01/20/2011, 7:36 AM

## 2011-01-21 LAB — GLUCOSE, CAPILLARY: Glucose-Capillary: 213 mg/dL — ABNORMAL HIGH (ref 70–99)

## 2011-01-21 LAB — CBC
HCT: 37.9 % — ABNORMAL LOW (ref 39.0–52.0)
MCH: 28.6 pg (ref 26.0–34.0)
MCHC: 35.1 g/dL (ref 30.0–36.0)
RDW: 13.6 % (ref 11.5–15.5)

## 2011-01-21 MED ORDER — METHOCARBAMOL 500 MG PO TABS
500.0000 mg | ORAL_TABLET | Freq: Four times a day (QID) | ORAL | Status: DC | PRN
  Filled 2011-01-21: qty 1

## 2011-01-21 NOTE — Progress Notes (Signed)
Subjective: No chest pain, no shortness of breath, no fever. Patient is still with some hematuria but improving.  Objective: Vital signs in last 24 hours: Temp:  [97.3 F (36.3 C)-98.4 F (36.9 C)] 98.4 F (36.9 C) (12/08 1000) Pulse Rate:  [70-81] 81  (12/08 1000) Resp:  [18-19] 19  (12/08 1000) BP: (132-153)/(79-86) 132/82 mmHg (12/08 1000) SpO2:  [95 %-100 %] 99 % (12/08 1000) Weight change:  Last BM Date: 01/18/11  Intake/Output from previous day: 12/07 0701 - 12/08 0700 In: 2440 [P.O.:1040] Out: 3325 [Urine:3325]     Physical Exam: General: Alert, awake, oriented x3, in no acute distress. HEENT: No bruits, no goiter. Neck brace in place Heart: Regular rate and rhythm, without murmurs, rubs, gallops. Lungs: Clear to auscultation bilaterally. Abdomen: Soft, no tenderness, nondistended, positive bowel sounds. Extremities: No clubbing cyanosis or edema with positive pedal pulses. Neuro: no new focal deficit.  Lab Results:  CBC:  Basename 01/21/11 0500 01/19/11 0515  WBC 15.1* 8.6  NEUTROABS -- --  HGB 13.3 13.0  HCT 37.9* 37.1*  MCV 81.5 82.3  PLT 179 162   CBG:  Basename 01/21/11 1217 01/21/11 0609 01/20/11 2259 01/20/11 1730 01/20/11 1152 01/20/11 0657  GLUCAP 201* 181* 236* 263* 189* 202*    Misc. Labs:  Recent Results (from the past 240 hour(s))  URINE CULTURE     Status: Normal   Collection Time   01/15/11  1:38 PM      Component Value Range Status Comment   Specimen Description URINE, CATHETERIZED   Final    Special Requests NONE   Final    Setup Time [id number]   Final    Colony Count 85,000 COLONIES/ML   Final    Culture     Final    Value: STAPHYLOCOCCUS SPECIES (COAGULASE NEGATIVE)     Note: RIFAMPIN AND GENTAMICIN SHOULD NOT BE USED AS SINGLE DRUGS FOR TREATMENT OF STAPH INFECTIONS.   Report Status 01/18/2011 FINAL   Final    Organism ID, Bacteria STAPHYLOCOCCUS SPECIES (COAGULASE NEGATIVE)   Final     Studies/Results: No results  found.  Medications: Scheduled Meds:    . aspirin EC  81 mg Oral Daily  . cephALEXin  500 mg Oral Q6H  . citalopram  10 mg Oral Daily  . dexamethasone  8 mg Intravenous QID  . dicyclomine  10 mg Oral TID AC & HS  . docusate sodium  100 mg Oral BID  . fentaNYL  50 mcg Intravenous Once  . insulin aspart  0-15 Units Subcutaneous TID WC  . insulin aspart  0-5 Units Subcutaneous QHS  . loratadine  10 mg Oral Daily  . LORazepam  1 mg Intravenous Once  . metoprolol succinate  12.5 mg Oral BID  . pantoprazole  40 mg Oral Daily  . polyethylene glycol  17 g Oral BID  . simethicone  80 mg Oral QID  . simvastatin  20 mg Oral q1800  . sodium chloride  3 mL Intravenous Q12H  . Tamsulosin HCl  0.4 mg Oral BID   Continuous Infusions:    . sodium chloride    . lactated ringers 75 mL/hr at 01/16/11 1300   PRN Meds:.acetaminophen, acetaminophen, acetaminophen, diazepam, gi cocktail, menthol-cetylpyridinium, morphine, ondansetron (ZOFRAN) IV, ondansetron (ZOFRAN) IV, ondansetron, oxyCODONE, phenol, polyvinyl alcohol, promethazine, sodium chloride, zolpidem, zolpidem  Assessment/Plan: 1-Cord compression: S/P surgical decompression; at this point plan is to continue decadron, continue pain medications and physical rehab process. Follow further recommendations from neurosurgery service.  Once patient able to participate on rehabilitation and his hematuria has cleared up patient will be transferred to CIR.  2-Hematuria: Improving; per urology will continue bladder irrigation and antibiotics for treatment of UTI. Hemoglobin continued to be stable at 13.3 range today.  3-HTN (hypertension):stable and well controlled, continue current regimen.  4-Dyslipidemia:continue zocor.  5-BPH (benign prostatic hyperplasia): continue flomax  6-GERD (gastroesophageal reflux disease):Continue PPI.  7-Irritable bowel syndrome (IBS): continue bentyl and PRN simethicone.  8-UTI 2/2 staph: Base on urine culture  will adjust antibiotics to Keflex 500 mg by mouth 4 times a day. Day 5/10 (total antibiotics days)  9-Depression: continue citalopram.  10-Leukocytosis: Second to steroids and also UTI. Continue antibiotics.  11-hyperglycemia: Secondary to steroids. Continue sliding scale insulin while receiving decadron. A1c 6.4. Further followup of elevated blood sugar to be done by PCP as an outpatient.  12-DVT: SCD's.    LOS: 13 days   Ricky Bradley 01/21/2011, 2:13 PM

## 2011-01-21 NOTE — Progress Notes (Signed)
CC: Hematuria, Urinary Retention  Subjective: Hematuria improving, nursing performing scheduled bladder irrigation which the patient is tolerating well. No other events overnight.  Objective: Vital signs in last 24 hours: Temp:  [97.3 F (36.3 C)-98.4 F (36.9 C)] 98.4 F (36.9 C) (12/08 1000) Pulse Rate:  [70-81] 81  (12/08 1000) Resp:  [18-19] 19  (12/08 1000) BP: (132-153)/(79-86) 132/82 mmHg (12/08 1000) SpO2:  [95 %-100 %] 99 % (12/08 1000) Last BM Date: 01/18/11  Intake/Output from previous day: 12/07 0701 - 12/08 0700 In: 2440 [P.O.:1040] Out: 3325 [Urine:3325] Intake/Output this shift:    EXAM: NAD Neck Brace on c/w recetn surgery RRR palpable pulses distally Abd soft Foley c/d/i with dark yellow urine, easily irrigated with 200cc sterile water and no clots found. No edema  Lab Results:   Basename 01/21/11 0500 01/19/11 0515  WBC 15.1* 8.6  HGB 13.3 13.0  HCT 37.9* 37.1*  PLT 179 162   BMET  Basename 01/19/11 0515  NA 131*  K 4.3  CL 95*  CO2 29  GLUCOSE 214*  BUN 28*  CREATININE 0.77  CALCIUM 8.3*   PT/INR No results found for this basename: LABPROT:2,INR:2 in the last 72 hours ABG No results found for this basename: PHART:2,PCO2:2,PO2:2,HCO3:2 in the last 72 hours  Studies/Results: No results found.  Anti-infectives: Anti-infectives     Start     Dose/Rate Route Frequency Ordered Stop   01/20/11 1600   cephALEXin (KEFLEX) capsule 500 mg        500 mg Oral 4 times per day 01/20/11 1328     01/17/11 2000   ciprofloxacin (CIPRO) tablet 500 mg  Status:  Discontinued        500 mg Oral 2 times daily 01/17/11 1911 01/20/11 1328   01/16/11 1400   ceFAZolin (ANCEF) IVPB 1 g/50 mL premix        1 g 100 mL/hr over 30 Minutes Intravenous 3 times per day 01/16/11 1022 01/16/11 2320   01/16/11 0845   ceFAZolin (ANCEF) IVPB 1 g/50 mL premix  Status:  Discontinued        1 g 100 mL/hr over 30 Minutes Intravenous  Once 01/16/11 0832 01/17/11 0416     01/16/11 0708   bacitracin 11914 UNITS injection     Comments: RATCLIFF, ESTHER: cabinet override         01/16/11 0708 01/16/11 1914   01/11/11 0000   ceFAZolin (ANCEF) IVPB 1 g/50 mL premix        1 g 100 mL/hr over 30 Minutes Intravenous 60 min pre-op 01/10/11 1230 01/16/11 0835          Assessment/Plan: 1 - Hematuria - improving clinically. No clots on exam today and only light pink with irrigation during exam. Continue periodic irrigation for now which we can likely discontinue soon if continued improvement. DDx includes recent bladder overdistension (most likely), UTI, or other. Recent abd-pelvis CT w/o stones or GU mass.  2 - Urinary Retention - ddx includes outlet obstruction and / or likely component of neurogenic bladder given significant cord compression. Continue foley for now. May warrant dedicated eval with urodynamics as outpatient.   [facility], Kaycee Mcgaugh 01/21/2011

## 2011-01-21 NOTE — Progress Notes (Signed)
Patient ID: Ricky Bradley, male   DOB: Aug 24, 1933, 77 y.o.   MRN: [id number] Patient is awake alert without voicing any significant complaints. He has diffuse weakness in the upper extremities and currently is awaiting placement in rehabilitation or skilled nursing facility. Nothing new to add from neurosurgical viewpoint.

## 2011-01-22 LAB — GLUCOSE, CAPILLARY
Glucose-Capillary: 244 mg/dL — ABNORMAL HIGH (ref 70–99)
Glucose-Capillary: 224 mg/dL — ABNORMAL HIGH (ref 70–99)

## 2011-01-22 LAB — CREATININE, SERUM: Creatinine, Ser: 0.73 mg/dL (ref 0.50–1.35)

## 2011-01-22 MED ORDER — ALUM & MAG HYDROXIDE-SIMETH 200-200-20 MG/5ML PO SUSP: 30.0000 mL | ORAL | Status: DC | PRN

## 2011-01-22 MED ORDER — PANTOPRAZOLE SODIUM 40 MG PO TBEC
40.0000 mg | DELAYED_RELEASE_TABLET | Freq: Two times a day (BID) | ORAL | Status: DC
  Administered 2011-01-22 – 2011-01-23 (×3): 40 mg via ORAL
  Filled 2011-01-22 (×3): qty 1

## 2011-01-22 MED ORDER — INSULIN GLARGINE 100 UNIT/ML ~~LOC~~ SOLN
8.0000 [IU] | SUBCUTANEOUS | Status: DC
  Administered 2011-01-23: 8 [IU] via SUBCUTANEOUS
  Filled 2011-01-22: qty 3

## 2011-01-22 NOTE — Progress Notes (Signed)
Patient ID: Ricky Bradley, male   DOB: 1933/05/13, 77 y.o.   MRN: [id number] Subjective: Patient reports No new issues from neurosurgical stand point  Objective: Vital signs in last 24 hours: Temp:  [97.1 F (36.2 C)-98.4 F (36.9 C)] 97.1 F (36.2 C) (12/09 0934) Pulse Rate:  [73-86] 86  (12/09 0934) Resp:  [19-20] 19  (12/09 0934) BP: (119-148)/(73-89) 119/73 mmHg (12/09 0934) SpO2:  [97 %-99 %] 97 % (12/09 0934)  Intake/Output from previous day: 12/08 0701 - 12/09 0700 In: 300  Out: 4400 [Urine:4400] Intake/Output this shift: Total I/O In: -  Out: 550 [Urine:550]  Stable neurologically  Lab Results:  Basename 01/21/11 0500  WBC 15.1*  HGB 13.3  HCT 37.9*  PLT 179   BMET  Basename 01/22/11 0703  NA --  K --  CL --  CO2 --  GLUCOSE --  BUN --  CREATININE 0.73  CALCIUM --    Studies/Results: No results found.  Assessment/Plan: Awaiting placement  LOS: 14 days  As above   [name], MD 01/22/2011, 9:52 AM

## 2011-01-22 NOTE — Progress Notes (Signed)
Ricky Bradley is a 77 y.o. male patient admitted with SCC, s/p decompression. NS appreciated.  SUBJECTIVE C/o of dyspepsia/abdominal discomfoort.   1. Unable to ambulate   2. Weakness     Past Medical History  Diagnosis Date  . Hypertension   . GERD (gastroesophageal reflux disease)   . High cholesterol   . Enlarged prostate   . High cholesterol    Current Facility-Administered Medications  Medication Dose Route Frequency Provider Last Rate Last Dose  . 0.9 %  sodium chloride infusion  250 mL Intravenous Continuous Cristi Loron 1 mL/hr at 01/22/11 1427 500 mL at 01/22/11 1427  . acetaminophen (TYLENOL) tablet 650 mg  650 mg Oral Q6H PRN Skip Estimable       Or  . acetaminophen (TYLENOL) suppository 650 mg  650 mg Rectal Q6H PRN Skip Estimable      . acetaminophen (TYLENOL) suppository 650 mg  650 mg Rectal Q4H PRN Cristi Loron      . alum & mag hydroxide-simeth (MAALOX/MYLANTA) 200-200-20 MG/5ML suspension 30 mL  30 mL Oral Q4H PRN Declyn Delsol      . aspirin EC tablet 81 mg  81 mg Oral Daily Eugene Garnet, PHARMD   81 mg at 01/22/11 1029  . cephALEXin (KEFLEX) capsule 500 mg  500 mg Oral Q6H Vassie Loll, MD   500 mg at 01/22/11 1639  . citalopram (CELEXA) tablet 10 mg  10 mg Oral Daily Jacqulyn Ducking. Cammarata   10 mg at 01/22/11 1028  . dexamethasone (DECADRON) injection 8 mg  8 mg Intravenous QID Stasia Cavalier, RPH   8 mg at 01/22/11 1639  . diazepam (VALIUM) tablet 5 mg  5 mg Oral Q6H PRN Cristi Loron   5 mg at 01/21/11 0901  . dicyclomine (BENTYL) capsule 10 mg  10 mg Oral TID AC & HS Jacqulyn Ducking. Cammarata   10 mg at 01/22/11 1639  . docusate sodium (COLACE) capsule 100 mg  100 mg Oral BID Cristi Loron   100 mg at 01/22/11 1028  . fentaNYL (SUBLIMAZE) injection 50 mcg  50 mcg Intravenous Once Judie Petit, MD      . gi cocktail  30 mL Oral BID PRN Christina Rama   30 mL at 01/14/11 1806  . insulin aspart (novoLOG) injection 0-15 Units   0-15 Units Subcutaneous TID WC Vassie Loll, MD   11 Units at 01/22/11 1640  . insulin aspart (novoLOG) injection 0-5 Units  0-5 Units Subcutaneous QHS Vassie Loll, MD   2 Units at 01/21/11 2245  . lactated ringers infusion   Intravenous Continuous Cristi Loron 75 mL/hr at 01/16/11 1300    . loratadine (CLARITIN) tablet 10 mg  10 mg Oral Daily Michelle A. Matthews   10 mg at 01/22/11 1028  . LORazepam (ATIVAN) injection 1 mg  1 mg Intravenous Once Skip Estimable      . menthol-cetylpyridinium (CEPACOL) lozenge 3 mg  1 lozenge Oral PRN Cristi Loron       Or  . phenol (CHLORASEPTIC) mouth spray 1 spray  1 spray Mouth/Throat PRN Cristi Loron      . methocarbamol (ROBAXIN) tablet 500 mg  500 mg Oral Q6H PRN Vassie Loll, MD      . metoprolol succinate (TOPROL-XL) 24 hr tablet 12.5 mg  12.5 mg Oral BID Christina Rama   12.5 mg at 01/22/11 1028  . morphine 4 MG/ML injection 1-4 mg  1-4 mg  Intravenous Q3H PRN Cristi Loron   4 mg at 01/22/11 1426  . ondansetron (ZOFRAN) tablet 4 mg  4 mg Oral Q6H PRN Jacqulyn Ducking. Cammarata   4 mg at 01/13/11 1458   Or  . ondansetron (ZOFRAN) injection 4 mg  4 mg Intravenous Q6H PRN Skip Estimable      . ondansetron (ZOFRAN) injection 4 mg  4 mg Intravenous Q4H PRN Cristi Loron      . oxyCODONE (Oxy IR/ROXICODONE) immediate release tablet 5 mg  5 mg Oral Q4H PRN Jacqulyn Ducking. Cammarata   5 mg at 01/20/11 1821  . pantoprazole (PROTONIX) EC tablet 40 mg  40 mg Oral Daily Jacqulyn Ducking. Cammarata   40 mg at 01/22/11 1028  . polyethylene glycol (MIRALAX / GLYCOLAX) packet 17 g  17 g Oral BID Shanker Ghimire   17 g at 01/22/11 1027  . polyvinyl alcohol (LIQUIFILM TEARS) 1.4 % ophthalmic solution 1 drop  1 drop Left Eye Q4H PRN Michelle A. Matthews   1 drop at 01/16/11 1628  . promethazine (PHENERGAN) injection 6.25-12.5 mg  6.25-12.5 mg Intravenous Q15 min PRN Judie Petit, MD      . simethicone [facility]) chewable tablet 80 mg  80 mg Oral  QID Duane Lope Jenkins   80 mg at 01/22/11 1640  . simvastatin (ZOCOR) tablet 20 mg  20 mg Oral q1800 Jacqulyn Ducking. Cammarata   20 mg at 01/22/11 1639  . sodium chloride 0.9 % injection 3 mL  3 mL Intravenous Q12H Cristi Loron   3 mL at 01/22/11 1027  . sodium chloride 0.9 % injection 3 mL  3 mL Intravenous PRN Cristi Loron      . Tamsulosin HCl (FLOMAX) capsule 0.4 mg  0.4 mg Oral BID Jacqulyn Ducking. Cammarata   0.4 mg at 01/22/11 1027  . zolpidem (AMBIEN) tablet 5 mg  5 mg Oral QHS PRN Debby Crosley, MD   5 mg at 01/21/11 2138  . zolpidem (AMBIEN) tablet 5 mg  5 mg Oral QHS PRN Cristi Loron   5 mg at 01/19/11 2213   Allergies  Allergen Reactions  . Vicodin (Hydrocodone-Acetaminophen) Hives    On neck and chest.   Principal Problem:  *Cord compression Active Problems:  HTN (hypertension)  Dyslipidemia  BPH (benign prostatic hyperplasia)  GERD (gastroesophageal reflux disease)  Lumbar stenosis with neurogenic claudication  Irritable bowel syndrome (IBS)  Gross hematuria   Vital signs in last 24 hours: Temp:  [97.1 F (36.2 C)-98.3 F (36.8 C)] 97.2 F (36.2 C) (12/09 1402) Pulse Rate:  [73-91] 91  (12/09 1402) Resp:  [19-22] 22  (12/09 1402) BP: (119-148)/(73-89) 124/77 mmHg (12/09 1402) SpO2:  [96 %-98 %] 96 % (12/09 1402) Weight change:  Last BM Date: 01/18/11  Intake/Output from previous day: 12/08 0701 - 12/09 0700 In: 300  Out: 4400 [Urine:4400] Intake/Output this shift: Total I/O In: 120 [Other:120] Out: 1500 [Urine:1500]  Lab Results:  Basename 01/21/11 0500  WBC 15.1*  HGB 13.3  HCT 37.9*  PLT 179   BMET  Basename 01/22/11 0703  NA --  K --  CL --  CO2 --  GLUCOSE --  BUN --  CREATININE 0.73  CALCIUM --    Studies/Results: No results found.  Medications: I have reviewed the patient's current medications.   Physical exam GENERAL- alert HEAD- normal atraumatic, Has  Neck collar postop. RESPIRATORY- appears well, vitals normal, no  respiratory distress, acyanotic, normal RR, ear and throat  exam is normal, neck free of mass or lymphadenopathy, chest clear, no wheezing, crepitations, rhonchi, normal symmetric air entry CVS- regular rate and rhythm, S1, S2 normal, no murmur, click, rub or gallop ABDOMEN- abdomen is soft without significant tenderness, masses, organomegaly or guarding NEURO- Grossly normal EXTREMITIES- extremities normal, atraumatic, no cyanosis or edema  Plan 1. Cord compression/ Lumbar stenosis with neurogenic claudication- appreciate surgical input. Management per NS. Await CIR. 2.  HTN (hypertension)- BP fluctuating. Monitor on current meds. 3.  GERD (gastroesophageal reflux disease)/ Irritable bowel syndrome (IBS)- c/o of dyspepsia. Might have steroid induced gastritis, ppi bid dosing. 4.  BPH (benign prostatic hyperplasia) /Gross hematuria- urine cleared. D/c flushes. Continue flomax. 5. Steroid induced hyperglycemia- uncontrolled. Add lantus.  Discussed plan of care with patient's daughter.  Sekai Gitlin 01/22/2011 5:46 PM Pager: [phone].

## 2011-01-23 ENCOUNTER — Encounter (HOSPITAL_COMMUNITY): Payer: Self-pay | Admitting: Anesthesiology

## 2011-01-23 ENCOUNTER — Inpatient Hospital Stay (HOSPITAL_COMMUNITY): Payer: Medicare Other | Admitting: Anesthesiology

## 2011-01-23 ENCOUNTER — Encounter (HOSPITAL_COMMUNITY): Payer: Self-pay | Admitting: Certified Registered Nurse Anesthetist

## 2011-01-23 ENCOUNTER — Encounter (HOSPITAL_COMMUNITY)
Admission: EM | Disposition: A | Discharge status: Rehabilitation Facility | Payer: Self-pay | Source: Ambulatory Visit | Attending: Internal Medicine

## 2011-01-23 ENCOUNTER — Inpatient Hospital Stay (HOSPITAL_COMMUNITY): Payer: Medicare Other

## 2011-01-23 HISTORY — PX: ANTERIOR CERVICAL DECOMP/DISCECTOMY FUSION: SHX1161

## 2011-01-23 LAB — COMPREHENSIVE METABOLIC PANEL
Albumin: 2.6 g/dL — ABNORMAL LOW (ref 3.5–5.2)
BUN: 25 mg/dL — ABNORMAL HIGH (ref 6–23)
Creatinine, Ser: 0.73 mg/dL (ref 0.50–1.35)
GFR calc Af Amer: >90 mL/min (ref >90)
Glucose, Bld: 211 mg/dL — ABNORMAL HIGH (ref 70–99)
Total Protein: 5.6 g/dL — ABNORMAL LOW (ref 6.0–8.3)

## 2011-01-23 LAB — GLUCOSE, CAPILLARY: Glucose-Capillary: 248 mg/dL — ABNORMAL HIGH (ref 70–99)

## 2011-01-23 LAB — CBC
HCT: 38.9 % — ABNORMAL LOW (ref 39.0–52.0)
MCH: 29.2 pg (ref 26.0–34.0)
MCHC: 35.7 g/dL (ref 30.0–36.0)
MCV: 81.7 fL (ref 78.0–100.0)
RDW: 13.8 % (ref 11.5–15.5)

## 2011-01-23 SURGERY — ANTERIOR CERVICAL DECOMPRESSION/DISCECTOMY FUSION 1 LEVEL/HARDWARE REMOVAL
Anesthesia: General | Site: Neck | Wound class: Clean

## 2011-01-23 MED ORDER — DOCUSATE SODIUM 100 MG PO CAPS
100.0000 mg | ORAL_CAPSULE | Freq: Two times a day (BID) | ORAL | Status: DC
  Administered 2011-01-24 – 2011-01-27 (×7): 100 mg via ORAL
  Filled 2011-01-23 (×9): qty 1

## 2011-01-23 MED ORDER — DIAZEPAM 5 MG PO TABS
5.0000 mg | ORAL_TABLET | Freq: Four times a day (QID) | ORAL | Status: DC | PRN
  Administered 2011-01-26: 5 mg via ORAL
  Filled 2011-01-23: qty 1

## 2011-01-23 MED ORDER — SODIUM CHLORIDE 0.9 % IV SOLN
20.0000 mg | INTRAVENOUS | Status: DC | PRN
  Administered 2011-01-23: 10 ug/min via INTRAVENOUS

## 2011-01-23 MED ORDER — CEPHALEXIN 500 MG PO CAPS
500.0000 mg | ORAL_CAPSULE | Freq: Four times a day (QID) | ORAL | Status: DC
  Administered 2011-01-24 (×2): 500 mg via ORAL
  Filled 2011-01-23 (×6): qty 1

## 2011-01-23 MED ORDER — ONDANSETRON HCL 4 MG/2ML IJ SOLN: 4.0000 mg | Freq: Once | INTRAMUSCULAR | Status: AC | PRN

## 2011-01-23 MED ORDER — MENTHOL 3 MG MT LOZG: 1.0000 | LOZENGE | OROMUCOSAL | Status: DC | PRN

## 2011-01-23 MED ORDER — DEXAMETHASONE 4 MG PO TABS
8.0000 mg | ORAL_TABLET | Freq: Four times a day (QID) | ORAL | Status: DC
  Administered 2011-01-24 – 2011-01-27 (×14): 8 mg via ORAL
  Filled 2011-01-23 (×17): qty 2

## 2011-01-23 MED ORDER — CITALOPRAM HYDROBROMIDE 10 MG PO TABS
10.0000 mg | ORAL_TABLET | Freq: Every day | ORAL | Status: DC
  Administered 2011-01-24 – 2011-01-27 (×4): 10 mg via ORAL
  Filled 2011-01-23 (×4): qty 1

## 2011-01-23 MED ORDER — ZOLPIDEM TARTRATE 5 MG PO TABS
5.0000 mg | ORAL_TABLET | Freq: Every evening | ORAL | Status: DC | PRN
  Administered 2011-01-24 (×2): 5 mg via ORAL
  Filled 2011-01-23: qty 1

## 2011-01-23 MED ORDER — TAMSULOSIN HCL 0.4 MG PO CAPS
0.4000 mg | ORAL_CAPSULE | Freq: Two times a day (BID) | ORAL | Status: DC
  Administered 2011-01-24 – 2011-01-27 (×8): 0.4 mg via ORAL
  Filled 2011-01-23 (×9): qty 1

## 2011-01-23 MED ORDER — OXYCODONE HCL 5 MG PO TABS
5.0000 mg | ORAL_TABLET | ORAL | Status: DC | PRN
  Administered 2011-01-24 – 2011-01-25 (×4): 5 mg via ORAL
  Filled 2011-01-23 (×6): qty 1

## 2011-01-23 MED ORDER — LORATADINE 10 MG PO TABS
10.0000 mg | ORAL_TABLET | Freq: Every day | ORAL | Status: DC
  Filled 2011-01-23: qty 1

## 2011-01-23 MED ORDER — MORPHINE SULFATE 4 MG/ML IJ SOLN
1.0000 mg | INTRAMUSCULAR | Status: DC | PRN
  Administered 2011-01-26: 1 mg via INTRAVENOUS
  Administered 2011-01-26 (×2): 2 mg via INTRAVENOUS
  Filled 2011-01-23 (×3): qty 1

## 2011-01-23 MED ORDER — THROMBIN 5000 UNITS EX KIT
PACK | CUTANEOUS | Status: DC | PRN
  Administered 2011-01-23 (×2): 5000 [IU] via TOPICAL

## 2011-01-23 MED ORDER — PHENOL 1.4 % MT LIQD: 1.0000 | OROMUCOSAL | Status: DC | PRN

## 2011-01-23 MED ORDER — DEXAMETHASONE 4 MG PO TABS
8.0000 mg | ORAL_TABLET | Freq: Four times a day (QID) | ORAL | Status: DC
  Administered 2011-01-23 (×3): 8 mg via ORAL
  Filled 2011-01-23 (×9): qty 2

## 2011-01-23 MED ORDER — DICYCLOMINE HCL 10 MG PO CAPS
10.0000 mg | ORAL_CAPSULE | Freq: Three times a day (TID) | ORAL | Status: DC
  Filled 2011-01-23 (×5): qty 1

## 2011-01-23 MED ORDER — METHOCARBAMOL 500 MG PO TABS
500.0000 mg | ORAL_TABLET | Freq: Four times a day (QID) | ORAL | Status: DC | PRN
  Filled 2011-01-23: qty 1

## 2011-01-23 MED ORDER — SENNOSIDES-DOCUSATE SODIUM 8.6-50 MG PO TABS
1.0000 | ORAL_TABLET | Freq: Two times a day (BID) | ORAL | Status: DC
  Administered 2011-01-23: 1 via ORAL
  Filled 2011-01-23 (×3): qty 1

## 2011-01-23 MED ORDER — INSULIN GLARGINE 100 UNIT/ML ~~LOC~~ SOLN: 10.0000 [IU] | SUBCUTANEOUS | Status: DC

## 2011-01-23 MED ORDER — MEPERIDINE HCL 25 MG/ML IJ SOLN: 6.2500 mg | INTRAMUSCULAR | Status: DC | PRN

## 2011-01-23 MED ORDER — DEXAMETHASONE SODIUM PHOSPHATE 4 MG/ML IJ SOLN
INTRAMUSCULAR | Status: DC | PRN
  Administered 2011-01-23: 8 mg via INTRAVENOUS

## 2011-01-23 MED ORDER — DEXTROSE 5 % IV SOLN
INTRAVENOUS | Status: DC | PRN
  Administered 2011-01-23: 20:00:00 via INTRAVENOUS

## 2011-01-23 MED ORDER — SUCCINYLCHOLINE CHLORIDE 20 MG/ML IJ SOLN
INTRAMUSCULAR | Status: DC | PRN
  Administered 2011-01-23: 100 mg via INTRAVENOUS

## 2011-01-23 MED ORDER — FENTANYL CITRATE 0.05 MG/ML IJ SOLN
INTRAMUSCULAR | Status: DC | PRN
  Administered 2011-01-23 (×2): 100 ug via INTRAVENOUS
  Administered 2011-01-23: 150 ug via INTRAVENOUS

## 2011-01-23 MED ORDER — INSULIN GLARGINE 100 UNIT/ML ~~LOC~~ SOLN
8.0000 [IU] | SUBCUTANEOUS | Status: DC
  Administered 2011-01-24: 8 [IU] via SUBCUTANEOUS
  Filled 2011-01-23: qty 3

## 2011-01-23 MED ORDER — POLYVINYL ALCOHOL 1.4 % OP SOLN
1.0000 [drp] | OPHTHALMIC | Status: DC | PRN
  Filled 2011-01-23: qty 15

## 2011-01-23 MED ORDER — DROPERIDOL 2.5 MG/ML IJ SOLN
INTRAMUSCULAR | Status: DC | PRN
  Administered 2011-01-23: 0.625 mg via INTRAVENOUS

## 2011-01-23 MED ORDER — SIMETHICONE 80 MG PO CHEW
80.0000 mg | CHEWABLE_TABLET | Freq: Four times a day (QID) | ORAL | Status: DC
  Administered 2011-01-24 – 2011-01-27 (×14): 80 mg via ORAL
  Filled 2011-01-23 (×17): qty 1

## 2011-01-23 MED ORDER — CEFAZOLIN SODIUM 1-5 GM-% IV SOLN
1.0000 g | Freq: Three times a day (TID) | INTRAVENOUS | Status: AC
  Administered 2011-01-24 (×2): 1 g via INTRAVENOUS
  Filled 2011-01-23 (×3): qty 50

## 2011-01-23 MED ORDER — LACTATED RINGERS IV SOLN
INTRAVENOUS | Status: DC | PRN
  Administered 2011-01-23: 20:00:00 via INTRAVENOUS

## 2011-01-23 MED ORDER — BACITRACIN 50000 UNITS IM SOLR
INTRAMUSCULAR | Status: AC
  Filled 2011-01-23: qty 50000

## 2011-01-23 MED ORDER — INSULIN ASPART 100 UNIT/ML ~~LOC~~ SOLN
0.0000 [IU] | Freq: Three times a day (TID) | SUBCUTANEOUS | Status: DC
  Filled 2011-01-23: qty 3

## 2011-01-23 MED ORDER — OXYCODONE-ACETAMINOPHEN 5-325 MG PO TABS: 1.0000 | ORAL_TABLET | ORAL | Status: DC | PRN

## 2011-01-23 MED ORDER — METOPROLOL SUCCINATE 12.5 MG HALF TABLET
12.5000 mg | ORAL_TABLET | Freq: Two times a day (BID) | ORAL | Status: DC
  Administered 2011-01-24: 12.5 mg via ORAL
  Filled 2011-01-23 (×3): qty 1

## 2011-01-23 MED ORDER — HEMOSTATIC AGENTS (NO CHARGE) OPTIME
TOPICAL | Status: DC | PRN
  Administered 2011-01-23: 1 via TOPICAL

## 2011-01-23 MED ORDER — BACITRACIN ZINC 500 UNIT/GM EX OINT
TOPICAL_OINTMENT | CUTANEOUS | Status: DC | PRN
  Administered 2011-01-23: 1 via TOPICAL

## 2011-01-23 MED ORDER — PROPOFOL 10 MG/ML IV EMUL
INTRAVENOUS | Status: DC | PRN
  Administered 2011-01-23: 80 mg via INTRAVENOUS
  Administered 2011-01-23: 120 mg via INTRAVENOUS

## 2011-01-23 MED ORDER — BISACODYL 10 MG RE SUPP: 10.0000 mg | Freq: Every day | RECTAL | Status: DC | PRN

## 2011-01-23 MED ORDER — SODIUM CHLORIDE 0.9 % IR SOLN
Status: DC | PRN
  Administered 2011-01-23: 21:00:00

## 2011-01-23 MED ORDER — LACTATED RINGERS IV SOLN: INTRAVENOUS | Status: DC

## 2011-01-23 MED ORDER — ONDANSETRON HCL 4 MG/2ML IJ SOLN
4.0000 mg | INTRAMUSCULAR | Status: DC | PRN
  Administered 2011-01-27: 4 mg via INTRAVENOUS
  Filled 2011-01-23: qty 2

## 2011-01-23 MED ORDER — LORAZEPAM 1 MG PO TABS: 1.0000 mg | ORAL_TABLET | Freq: Once | ORAL | Status: DC

## 2011-01-23 MED ORDER — SODIUM CHLORIDE 0.9 % IV SOLN
INTRAVENOUS | Status: AC
  Filled 2011-01-23: qty 500

## 2011-01-23 MED ORDER — SODIUM CHLORIDE 0.9 % IR SOLN
Status: DC | PRN
  Administered 2011-01-23: 1000 mL

## 2011-01-23 MED ORDER — PANTOPRAZOLE SODIUM 40 MG PO TBEC
40.0000 mg | DELAYED_RELEASE_TABLET | Freq: Two times a day (BID) | ORAL | Status: DC
  Administered 2011-01-24 – 2011-01-27 (×7): 40 mg via ORAL
  Filled 2011-01-23 (×8): qty 1

## 2011-01-23 MED ORDER — ACETAMINOPHEN 325 MG PO TABS: 650.0000 mg | ORAL_TABLET | ORAL | Status: DC | PRN

## 2011-01-23 MED ORDER — HYDROMORPHONE HCL PF 1 MG/ML IJ SOLN
0.2500 mg | INTRAMUSCULAR | Status: DC | PRN
Start: 2011-01-23 — End: 2011-01-23

## 2011-01-23 MED ORDER — CEFAZOLIN SODIUM 1-5 GM-% IV SOLN
INTRAVENOUS | Status: DC | PRN
  Administered 2011-01-23: 1 g via INTRAVENOUS

## 2011-01-23 MED ORDER — LIDOCAINE HCL (CARDIAC) 20 MG/ML IV SOLN
INTRAVENOUS | Status: DC | PRN
  Administered 2011-01-23: 100 mg via INTRAVENOUS

## 2011-01-23 MED ORDER — LORAZEPAM 2 MG/ML IJ SOLN
0.5000 mg | Freq: Once | INTRAMUSCULAR | Status: AC
  Administered 2011-01-23: 0.5 mg via INTRAVENOUS

## 2011-01-23 MED ORDER — SIMVASTATIN 20 MG PO TABS
20.0000 mg | ORAL_TABLET | Freq: Every day | ORAL | Status: DC
  Administered 2011-01-24 – 2011-01-26 (×3): 20 mg via ORAL
  Filled 2011-01-23 (×5): qty 1

## 2011-01-23 MED ORDER — ONDANSETRON HCL 4 MG/2ML IJ SOLN
INTRAMUSCULAR | Status: DC | PRN
  Administered 2011-01-23: 4 mg via INTRAVENOUS

## 2011-01-23 MED ORDER — POLYETHYLENE GLYCOL 3350 17 G PO PACK
17.0000 g | PACK | Freq: Two times a day (BID) | ORAL | Status: DC
  Administered 2011-01-24 – 2011-01-27 (×6): 17 g via ORAL
  Filled 2011-01-23 (×9): qty 1

## 2011-01-23 MED ORDER — ASPIRIN EC 81 MG PO TBEC
81.0000 mg | DELAYED_RELEASE_TABLET | Freq: Every day | ORAL | Status: DC
  Filled 2011-01-23: qty 1

## 2011-01-23 MED ORDER — CEFAZOLIN SODIUM 1-5 GM-% IV SOLN
INTRAVENOUS | Status: AC
  Filled 2011-01-23: qty 50

## 2011-01-23 MED ORDER — ACETAMINOPHEN 650 MG RE SUPP: 650.0000 mg | RECTAL | Status: DC | PRN

## 2011-01-23 MED ORDER — NEOSTIGMINE METHYLSULFATE 1 MG/ML IJ SOLN
INTRAMUSCULAR | Status: DC | PRN
  Administered 2011-01-23: 4 mg via INTRAVENOUS

## 2011-01-23 MED ORDER — ROCURONIUM BROMIDE 100 MG/10ML IV SOLN
INTRAVENOUS | Status: DC | PRN
  Administered 2011-01-23: 30 mg via INTRAVENOUS

## 2011-01-23 MED ORDER — GLYCOPYRROLATE 0.2 MG/ML IJ SOLN
INTRAMUSCULAR | Status: DC | PRN
  Administered 2011-01-23: .8 mg via INTRAVENOUS

## 2011-01-23 SURGICAL SUPPLY — 60 items
APL SKNCLS STERI-STRIP NONHPOA (GAUZE/BANDAGES/DRESSINGS) ×1
BAG DECANTER FOR FLEXI CONT (MISCELLANEOUS) ×2 IMPLANT
BENZOIN TINCTURE PRP APPL 2/3 (GAUZE/BANDAGES/DRESSINGS) ×3 IMPLANT
BLADE SURG 15 STRL LF DISP TIS (BLADE) ×1 IMPLANT
BLADE SURG 15 STRL SS (BLADE)
BLADE ULTRA TIP 2M (BLADE) ×1 IMPLANT
BRUSH SCRUB EZ PLAIN DRY (MISCELLANEOUS) ×2 IMPLANT
BUR BARREL STRAIGHT FLUTE 4.0 (BURR) ×2 IMPLANT
BUR MATCHSTICK NEURO 3.0 LAGG (BURR) ×2 IMPLANT
CANISTER SUCTION 2500CC (MISCELLANEOUS) ×2 IMPLANT
CLOSURE STERI STRIP 1/2 X4 (GAUZE/BANDAGES/DRESSINGS) ×1 IMPLANT
CLOTH BEACON ORANGE TIMEOUT ST (SAFETY) ×2 IMPLANT
CONT SPEC 4OZ CLIKSEAL STRL BL (MISCELLANEOUS) ×2 IMPLANT
CORDS BIPOLAR (ELECTRODE) ×1 IMPLANT
COVER MAYO STAND STRL (DRAPES) ×2 IMPLANT
DRAPE LAPAROTOMY 100X72 PEDS (DRAPES) ×2 IMPLANT
DRAPE MICROSCOPE LEICA (MISCELLANEOUS) IMPLANT
DRAPE MICROSCOPE ZEISS OPMI (DRAPES) ×1 IMPLANT
DRAPE POUCH INSTRU U-SHP 10X18 (DRAPES) ×2 IMPLANT
DRAPE SURG 17X23 STRL (DRAPES) ×4 IMPLANT
ELECT REM PT RETURN 9FT ADLT (ELECTROSURGICAL) ×2
ELECTRODE REM PT RTRN 9FT ADLT (ELECTROSURGICAL) ×1 IMPLANT
GAUZE SPONGE 4X4 16PLY XRAY LF (GAUZE/BANDAGES/DRESSINGS) ×1 IMPLANT
GLOVE BIO SURGEON STRL SZ 6.5 (GLOVE) ×3 IMPLANT
GLOVE BIO SURGEON STRL SZ8.5 (GLOVE) ×2 IMPLANT
GLOVE BIOGEL PI IND STRL 6.5 (GLOVE) IMPLANT
GLOVE BIOGEL PI INDICATOR 6.5 (GLOVE) ×3
GLOVE ECLIPSE 7.5 STRL STRAW (GLOVE) ×1 IMPLANT
GLOVE EXAM NITRILE LRG STRL (GLOVE) IMPLANT
GLOVE EXAM NITRILE MD LF STRL (GLOVE) IMPLANT
GLOVE EXAM NITRILE XL STR (GLOVE) IMPLANT
GLOVE EXAM NITRILE XS STR PU (GLOVE) IMPLANT
GLOVE SS BIOGEL STRL SZ 8 (GLOVE) ×1 IMPLANT
GLOVE SUPERSENSE BIOGEL SZ 8 (GLOVE) ×1
GOWN BRE IMP SLV AUR LG STRL (GOWN DISPOSABLE) ×3 IMPLANT
GOWN BRE IMP SLV AUR XL STRL (GOWN DISPOSABLE) IMPLANT
KIT BASIN OR (CUSTOM PROCEDURE TRAY) ×2 IMPLANT
KIT ROOM TURNOVER OR (KITS) ×2 IMPLANT
MARKER SKIN DUAL TIP RULER LAB (MISCELLANEOUS) ×2 IMPLANT
NDL SPNL 18GX3.5 QUINCKE PK (NEEDLE) ×1 IMPLANT
NEEDLE HYPO 22GX1.5 SAFETY (NEEDLE) ×1 IMPLANT
NEEDLE SPNL 18GX3.5 QUINCKE PK (NEEDLE) IMPLANT
NS IRRIG 1000ML POUR BTL (IV SOLUTION) ×2 IMPLANT
PACK LAMINECTOMY NEURO (CUSTOM PROCEDURE TRAY) ×2 IMPLANT
PIN DISTRACTION 14MM (PIN) ×4 IMPLANT
PUTTY 2.5ML ACTIFUSE ABX (Putty) ×1 IMPLANT
RUBBERBAND STERILE (MISCELLANEOUS) ×2 IMPLANT
SCREW XTEND SELFTAP VAR 4.6X14 (Screw) ×4 IMPLANT
SPONGE GAUZE 4X4 12PLY (GAUZE/BANDAGES/DRESSINGS) ×2 IMPLANT
SPONGE INTESTINAL PEANUT (DISPOSABLE) ×3 IMPLANT
SPONGE SURGIFOAM ABS GEL SZ50 (HEMOSTASIS) ×2 IMPLANT
STRIP CLOSURE SKIN 1/2X4 (GAUZE/BANDAGES/DRESSINGS) ×2 IMPLANT
SUT VIC AB 0 CT1 27 (SUTURE) ×2
SUT VIC AB 0 CT1 27XBRD ANTBC (SUTURE) ×1 IMPLANT
SUT VIC AB 3-0 SH 8-18 (SUTURE) ×3 IMPLANT
SYR 20ML ECCENTRIC (SYRINGE) ×2 IMPLANT
TAPE CLOTH SURG 4X10 WHT LF (GAUZE/BANDAGES/DRESSINGS) ×1 IMPLANT
TOWEL OR 17X24 6PK STRL BLUE (TOWEL DISPOSABLE) ×2 IMPLANT
TOWEL OR 17X26 10 PK STRL BLUE (TOWEL DISPOSABLE) ×2 IMPLANT
WATER STERILE IRR 1000ML POUR (IV SOLUTION) ×2 IMPLANT

## 2011-01-23 NOTE — Progress Notes (Signed)
CIR appreciated. MRI findings noted. SUBJECTIVE "nothing going right. C/O abdominal pain.   1. Unable to ambulate   2. Weakness     Past Medical History  Diagnosis Date  . Hypertension   . GERD (gastroesophageal reflux disease)   . High cholesterol   . Enlarged prostate   . High cholesterol    Current Facility-Administered Medications  Medication Dose Route Frequency Provider Last Rate Last Dose  . 0.9 %  sodium chloride infusion  250 mL Intravenous Continuous Cristi Loron 1 mL/hr at 01/22/11 1427 500 mL at 01/22/11 1427  . acetaminophen (TYLENOL) tablet 650 mg  650 mg Oral Q6H PRN Skip Estimable       Or  . acetaminophen (TYLENOL) suppository 650 mg  650 mg Rectal Q6H PRN Skip Estimable      . acetaminophen (TYLENOL) suppository 650 mg  650 mg Rectal Q4H PRN Cristi Loron      . alum & mag hydroxide-simeth (MAALOX/MYLANTA) 200-200-20 MG/5ML suspension 30 mL  30 mL Oral Q4H PRN Seaborn Nakama      . aspirin EC tablet 81 mg  81 mg Oral Daily Eugene Garnet, PHARMD   81 mg at 01/23/11 1055  . bisacodyl (DULCOLAX) suppository 10 mg  10 mg Rectal Daily PRN Harshil Cavallaro      . cephALEXin (KEFLEX) capsule 500 mg  500 mg Oral Q6H Vassie Loll, MD   500 mg at 01/23/11 1813  . citalopram (CELEXA) tablet 10 mg  10 mg Oral Daily Jacqulyn Ducking. Cammarata   10 mg at 01/23/11 1054  . dexamethasone (DECADRON) tablet 8 mg  8 mg Oral Q6H Daniel J. Angiulli, PA   8 mg at 01/23/11 1812  . diazepam (VALIUM) tablet 5 mg  5 mg Oral Q6H PRN Cristi Loron   5 mg at 01/23/11 1055  . dicyclomine (BENTYL) capsule 10 mg  10 mg Oral TID AC & HS Jacqulyn Ducking. Cammarata   10 mg at 01/23/11 1812  . docusate sodium (COLACE) capsule 100 mg  100 mg Oral BID Cristi Loron   100 mg at 01/23/11 1056  . fentaNYL (SUBLIMAZE) injection 50 mcg  50 mcg Intravenous Once Judie Petit, MD      . gi cocktail  30 mL Oral BID PRN Christina Rama   30 mL at 01/14/11 1806  . insulin aspart (novoLOG)  injection 0-15 Units  0-15 Units Subcutaneous TID WC Vassie Loll, MD   3 Units at 01/23/11 1813  . insulin aspart (novoLOG) injection 0-5 Units  0-5 Units Subcutaneous QHS Vassie Loll, MD   2 Units at 01/22/11 2254  . insulin glargine (LANTUS) injection 8 Units  8 Units Subcutaneous Q0700 Johnston Maddocks   8 Units at 01/23/11 0740  . lactated ringers infusion   Intravenous Continuous Vaneza Pickart 20 mL/hr at 01/22/11 2330    . loratadine (CLARITIN) tablet 10 mg  10 mg Oral Daily Michelle A. Matthews   10 mg at 01/23/11 1055  . LORazepam (ATIVAN) injection 0.5 mg  0.5 mg Intravenous Once Kristine Chahal   0.5 mg at 01/23/11 1543  . LORazepam (ATIVAN) injection 1 mg  1 mg Intravenous Once Jacqulyn Ducking. Cammarata   1 mg at 01/23/11 1506  . LORazepam (ATIVAN) tablet 1 mg  1 mg Oral Once Cristi Loron      . menthol-cetylpyridinium (CEPACOL) lozenge 3 mg  1 lozenge Oral PRN Cristi Loron       Or  .  phenol (CHLORASEPTIC) mouth spray 1 spray  1 spray Mouth/Throat PRN Cristi Loron      . methocarbamol (ROBAXIN) tablet 500 mg  500 mg Oral Q6H PRN Vassie Loll, MD      . metoprolol succinate (TOPROL-XL) 24 hr tablet 12.5 mg  12.5 mg Oral BID Christina Rama   12.5 mg at 01/23/11 1055  . morphine 4 MG/ML injection 1-4 mg  1-4 mg Intravenous Q3H PRN Cristi Loron   4 mg at 01/22/11 2256  . ondansetron (ZOFRAN) tablet 4 mg  4 mg Oral Q6H PRN Jacqulyn Ducking. Cammarata   4 mg at 01/13/11 1458   Or  . ondansetron (ZOFRAN) injection 4 mg  4 mg Intravenous Q6H PRN Skip Estimable      . ondansetron (ZOFRAN) injection 4 mg  4 mg Intravenous Q4H PRN Cristi Loron      . oxyCODONE (Oxy IR/ROXICODONE) immediate release tablet 5 mg  5 mg Oral Q4H PRN Jacqulyn Ducking. Cammarata   5 mg at 01/23/11 1055  . pantoprazole (PROTONIX) EC tablet 40 mg  40 mg Oral BID AC Chiniqua Kilcrease   40 mg at 01/23/11 1812  . polyethylene glycol (MIRALAX / GLYCOLAX) packet 17 g  17 g Oral BID Shanker Ghimire   17 g at 01/23/11  1054  . polyvinyl alcohol (LIQUIFILM TEARS) 1.4 % ophthalmic solution 1 drop  1 drop Left Eye Q4H PRN Michelle A. Matthews   1 drop at 01/16/11 1628  . promethazine (PHENERGAN) injection 6.25-12.5 mg  6.25-12.5 mg Intravenous Q15 min PRN Judie Petit, MD      . senna-docusate (Senokot-S) tablet 1 tablet  1 tablet Oral BID Ariba Lehnen   1 tablet at 01/23/11 1400  . simethicone (MYLICON) chewable tablet 80 mg  80 mg Oral QID Cristi Loron   80 mg at 01/23/11 1812  . simvastatin (ZOCOR) tablet 20 mg  20 mg Oral q1800 Jacqulyn Ducking. Cammarata   20 mg at 01/23/11 1813  . sodium chloride 0.9 % injection 3 mL  3 mL Intravenous Q12H Cristi Loron   3 mL at 01/23/11 1000  . sodium chloride 0.9 % injection 3 mL  3 mL Intravenous PRN Cristi Loron      . Tamsulosin HCl (FLOMAX) capsule 0.4 mg  0.4 mg Oral BID Jacqulyn Ducking. Cammarata   0.4 mg at 01/23/11 1054  . zolpidem (AMBIEN) tablet 5 mg  5 mg Oral QHS PRN Debby Crosley, MD   5 mg at 01/21/11 2138  . zolpidem (AMBIEN) tablet 5 mg  5 mg Oral QHS PRN Cristi Loron   5 mg at 01/22/11 2314  . DISCONTD: dexamethasone (DECADRON) injection 8 mg  8 mg Intravenous QID Stasia Cavalier, RPH   8 mg at 01/22/11 2255   Allergies  Allergen Reactions  . Vicodin (Hydrocodone-Acetaminophen) Hives    On neck and chest.   Principal Problem:  *Cord compression Active Problems:  HTN (hypertension)  Dyslipidemia  BPH (benign prostatic hyperplasia)  GERD (gastroesophageal reflux disease)  Lumbar stenosis with neurogenic claudication  Irritable bowel syndrome (IBS)  Gross hematuria   Vital signs in last 24 hours: Temp:  [97.3 F (36.3 C)-98.4 F (36.9 C)] 97.3 F (36.3 C) (12/10 1747) Pulse Rate:  [68-90] 69  (12/10 1747) Resp:  [16-20] 20  (12/10 1747) BP: (123-157)/(72-90) 146/90 mmHg (12/10 1747) SpO2:  [96 %-98 %] 98 % (12/10 1747) Weight change:  Last BM Date: 01/18/11  Intake/Output from previous  day: 12/09 0701 - 12/10 0700 In: 260  [I.V.:140] Out: 2100 [Urine:2100] Intake/Output this shift: Total I/O In: 600 [P.O.:360; I.V.:240] Out: 1550 [Urine:1550]  Lab Results:  Basename 01/23/11 0745 01/21/11 0500  WBC 20.3* 15.1*  HGB 13.9 13.3  HCT 38.9* 37.9*  PLT 195 179   BMET  Basename 01/23/11 0745 01/22/11 0703  NA 128* --  K 4.7 --  CL 91* --  CO2 29 --  GLUCOSE 211* --  BUN 25* --  CREATININE 0.73 0.73  CALCIUM 8.7 --    Studies/Results: Mr Cervical Spine Wo Contrast  01/23/2011  *RADIOLOGY REPORT*  Clinical Data: 77 year old male with quadriparesis.  Neck pain.  MRI CERVICAL SPINE WITHOUT CONTRAST  Technique:  Multiplanar and multiecho pulse sequences of the cervical spine, to include the craniocervical junction and cervicothoracic junction, were obtained according to standard protocol without intravenous contrast.  Comparison: Intraoperative radiographs 01/16/2011.  Preoperative study 01/09/2011.  Findings: The patient is undergone interval C6-C7 ACDF.  However, cord compression at this level has not abated and appears somewhat worse as a result of a broad-based ventral epidural space occupying lesion which is nearly isointense to the spinal cord on T1 and STIR images, and dark on sagittal T2 (series 3 image 70). The ventral epidural collection tracks from the C5-C6 disc space to the C7-T1 disc space.  The thecal sac is virtually obliterated at this level (series 6 image 17) when combined with the preexisting severe ligament flavum hypertrophy at this level.  There is abnormal signal in the spinal cord, which to a degree appears similar to that on the preoperative study.  Prevertebral soft tissues are within normal limits.  Mild susceptibility artifact related to the C6-C7 ACDF hardware is noted.  No definite marrow edema.  Severe facet hypertrophy at C6- C7 with trace fluid in the facet joints is re-identified.  Above and below the C6-C7 level the cervical spinal canal is stable.  The visualized upper thoracic  canal remains widely patent.  Bulky flowing ventral cervical osteophytes are re-identified. Cervicomedullary junction is within normal limits.  Stable right thyroid nodule.  IMPRESSION: 1.  Interval C6-C7 ACDF with continued/progressed spinal cord compression at this level now in large part to a broad-based ventral epidural process, favor epidural hematoma. Critical Value/emergent results were called by telephone at the time of interpretation on 01/23/2011  at 1643 hours  to  Dr. Delma Officer in the neuro OR, who verbally acknowledged these results. 2.  Stable cervical levels elsewhere.  Original Report Authenticated By: Harley Hallmark, M.D.    Medications: I have reviewed the patient's current medications.   Physical exam GENERAL- alert HEAD- normal atraumatic, neck collar.RESPIRATORY- appears well, vitals normal, no respiratory distress, acyanotic, normal RR, ear and throat exam is normal, neck free of mass or lymphadenopathy, chest clear, no wheezing, crepitations, rhonchi, normal symmetric air entry CVS- regular rate and rhythm, S1, S2 normal, no murmur, click, rub or gallop ABDOMEN- abdomen is soft without significant tenderness, masses, organomegaly or guarding NEURO- Grossly normal EXTREMITIES- extremities normal, atraumatic, no cyanosis or edema  Plan  1. Cord compression/ Lumbar stenosis with neurogenic claudication ?epidural hematoma. appreciate surgical input. Management per NS. Await CIR once ok with NS.  2. HTN (hypertension)- BP fluctuating, steroid element. Monitor on current meds.  3. GERD (gastroesophageal reflux disease)/ Irritable bowel syndrome (IBS)- c/o of dyspepsia still. Might have steroid induced gastritis, ppi bid dosing.  4. BPH (benign prostatic hyperplasia) /Gross hematuria- urine cleared. D/c flushes. Continue flomax.  5. Steroid induced hyperglycemia- uncontrolled. Increase lantus.  6. Leukocytosis- seems steroid induced. Discussed plan of care with patient's  daughter      Kedron Uno 01/23/2011 6:50 PM Pager: [phone].

## 2011-01-23 NOTE — Progress Notes (Signed)
Physical Therapy Treatment Patient Details Name: Ricky Bradley MRN: [id number] DOB: 11/04/1933 Today's Date: 01/23/2011  PT Assessment/Plan  PT - Assessment/Plan Comments on Treatment Session: Pt with a definite decline in mobility today. Pt presents with more trunk and bilateral LE weakness. Pt was unable to maintain trunk activiation to sit EOB and LE strrength has declined to 1-2/5. Pt scheduled for transfer to CIR today.  PT Plan: Discharge plan remains appropriate Follow Up Recommendations: Inpatient Rehab Equipment Recommended: Defer to next venue PT Goals  Acute Rehab PT Goals PT Goal: Rolling Supine to Left Side - Progress: Other (comment) (pt with a decline today due to incr. weakness) PT Goal: Supine/Side to Sit - Progress: Other (comment) (decline today due to incr. weakness) PT Goal: Sit at [facility] Of Bed - Progress: Other (comment) (declined due to incr. weakness) PT Goal: Sit to Stand - Progress: Other (comment) (unable due to LE weakness)  PT Treatment Precautions/Restrictions  Precautions Precautions: Fall;Other (comment) (cervical) Precaution Comments: Cervical precautions Required Braces or Orthoses: Yes Cervical Brace: Hard collar Restrictions Weight Bearing Restrictions: No Mobility (including Balance) Bed Mobility Bed Mobility: Yes Rolling Left: 1: +2 Total assist;Patient percentage (comment) (Pt =20%) Rolling Left Details (indicate cue type and reason): able to assist some with UE reaching across body for the rail Left Sidelying to Sit: 1: +2 Total assist;Patient percentage (comment) (pt=20%) Left Sidelying to Sit Details (indicate cue type and reason): Pt demonstrated little to no truck control. Transfers Transfers: Yes Sit to Stand: Other (comment) (unable due to progressive weakness) Squat Pivot Transfers: 1: +2 Total assist;Patient percentage (comment) (pt=10%) Squat Pivot Transfer Details (indicate cue type and reason): max cues and total support provided  to trunk and blocking of Bilateral LE.  Ambulation/Gait Ambulation/Gait: No (unable due to progressive weakness)  Balance Balance Assessed: Yes Static Sitting Balance Static Sitting - Balance Support: Bilateral upper extremity supported;Feet supported Static Sitting - Level of Assistance: 2: Max assist Static Sitting - Comment/# of Minutes: falls with gravity, little to no abdominal activation, worked on initiating trunk flexion and back to midline with mod assist to maintain balance Exercise  General Exercises - Lower Extremity Ankle Circles/Pumps: PROM;Both;10 reps;Supine;Other (comment);AAROM (PF=2/5 DF=1/5) Heel Slides: AAROM;Strengthening;Both;10 reps;Supine;Other (comment) (Hip flexion 1/5, hip extension 2/5) Hip ABduction/ADduction: AAROM;Strengthening;Both;10 reps;Seated;Other (comment) (seated with bent knee strength 2/5) End of Session PT - End of Session Equipment Utilized During Treatment: Gait belt Activity Tolerance: Patient limited by fatigue Patient left: in chair;with call bell in reach;with family/visitor present Nurse Communication: Need for lift equipment General Behavior During Session: Flat affect  Greggory Stallion 01/23/2011, 9:45 AM

## 2011-01-23 NOTE — H&P (Signed)
Physical Medicine and Rehabilitation Admission H&P  Ricky Bradley is an 77 y.o. male.   Chief Complaint  Patient presents with  . Weakness  : HPI: 77 year old male admitted November 25 with bilateral lower weakness progressive in nature. Patient with similar episodes in May 2012 when he was seen by neurosurgery and underwent lumbar laminectomy for spinal stenosis. MRI of cervical spine demonstrated severe stenosis at cervical C6 and C7 with myelopathy. Patient underwent cervical C6-7 anterior cervical discectomy with decompression on December 3 per Dr. Tressie Stalker of neurosurgery.Fitted with cervical collar.Decadron protocol as directed. Postoperatively with urinary retention and hematuria with   noted history of benign prostatic hypertrophy. He was seen in consultation by urology services Dr. Retta Diones with Foley catheter inserted until mobility improved and then plan voiding trial.Placed on keflex 12/7 for empiric coverage of suspect UTI with lattest urine study showing 85,000 staph coagulase negative. Physical and occupational therapies with total assist for transfer and gait of 2 feet as of 12/7 Physical medicine and rehabilitation were consulted for consideration of inpatient rehabilitation services.  Review of Systems  Constitutional: Positive for malaise/fatigue.  HENT: Positive for neck pain.  Eyes: Negative for double vision.  Respiratory: Negative for cough and shortness of breath.  Cardiovascular: Negative for chest pain.  Gastrointestinal: Positive for constipation. Negative for nausea.  Genitourinary:  Urinary retention  Skin: Negative for itching.  Neurological: Negative for dizziness, seizures and headaches.  Psychiatric/Behavioral: Positive for depression, has been a little fatigued as of late  Past Medical History  Diagnosis Date  . Hypertension   . GERD (gastroesophageal reflux disease)   . High cholesterol   . Enlarged prostate   . High cholesterol    Past Surgical  History  Procedure Date  . Joint replacement 2001  and 2002    both knees  . Laminectomy 08/2010  . Back surgery   . Replacement total knee   . Cholecystectomy 11/01/10  . Anterior cervical decomp/discectomy fusion 01/16/2011    Procedure: ANTERIOR CERVICAL DECOMPRESSION/DISCECTOMY FUSION 1 LEVEL;  Surgeon: Cristi Loron;  Location: [facility];  Service: Neurosurgery;  Laterality: N/A;  Cervical six-seven  Anterior Cervical Decomp[ression Fusion   History reviewed. No pertinent family history. Social History:  reports that he has quit smoking. He does not have any smokeless tobacco history on file. He reports that he does not drink alcohol or use illicit drugs. Allergies:  Allergies  Allergen Reactions  . Vicodin (Hydrocodone-Acetaminophen) Hives    On neck and chest.   Medications Prior to Admission  Medication Dose Route Frequency Provider Last Rate Last Dose  . 0.9 %  sodium chloride infusion  250 mL Intravenous Continuous Cristi Loron 1 mL/hr at 01/22/11 1427 500 mL at 01/22/11 1427  . acetaminophen (TYLENOL) tablet 650 mg  650 mg Oral Q6H PRN Skip Estimable       Or  . acetaminophen (TYLENOL) suppository 650 mg  650 mg Rectal Q6H PRN Skip Estimable      . acetaminophen (TYLENOL) suppository 650 mg  650 mg Rectal Q4H PRN Cristi Loron      . alum & mag hydroxide-simeth (MAALOX/MYLANTA) 200-200-20 MG/5ML suspension 30 mL  30 mL Oral Q4H PRN Simbiso Ranga      . aspirin EC tablet 81 mg  81 mg Oral Daily Eugene Garnet, PHARMD   81 mg at 01/22/11 1029  . bacitracin 16109 UNITS injection           . ceFAZolin (  ANCEF) IVPB 1 g/50 mL premix  1 g Intravenous 60 min Pre-Op Duane Lope Jenkins   1 g at 01/16/11 1610  . ceFAZolin (ANCEF) IVPB 1 g/50 mL premix  1 g Intravenous Q8H Duane Lope Jenkins   1 g at 01/16/11 2250  . cephALEXin (KEFLEX) capsule 500 mg  500 mg Oral Q6H Vassie Loll, MD   500 mg at 01/23/11 0740  . citalopram (CELEXA) tablet 10 mg  10 mg Oral  Daily Jacqulyn Ducking. Cammarata   10 mg at 01/22/11 1028  . dexamethasone (DECADRON) injection 8 mg  8 mg Intravenous QID Stasia Cavalier, RPH   8 mg at 01/22/11 2255  . diazepam (VALIUM) tablet 5 mg  5 mg Oral Q6H PRN Cristi Loron   5 mg at 01/21/11 0901  . dicyclomine (BENTYL) capsule 10 mg  10 mg Oral TID AC & HS Jacqulyn Ducking. Cammarata   10 mg at 01/22/11 2254  . docusate sodium (COLACE) capsule 100 mg  100 mg Oral BID Cristi Loron   100 mg at 01/22/11 2255  . enoxaparin (LOVENOX) injection 40 mg  40 mg Subcutaneous Q24H Cristi Loron   40 mg at 01/14/11 2202  . fentaNYL (SUBLIMAZE) injection 50 mcg  50 mcg Intravenous Once Judie Petit, MD      . gadobenate dimeglumine (MULTIHANCE) injection 15 mL  15 mL Intravenous Once Medication Radiologist   15 mL at 01/09/11 1156  . gadobenate dimeglumine (MULTIHANCE) injection 17 mL  17 mL Intravenous Once PRN Medication Radiologist   17 mL at 01/08/11 1722  . gi cocktail  30 mL Oral BID PRN Christina Rama   30 mL at 01/14/11 1806  . insulin aspart (novoLOG) injection 0-15 Units  0-15 Units Subcutaneous TID WC Vassie Loll, MD   11 Units at 01/22/11 1640  . insulin aspart (novoLOG) injection 0-5 Units  0-5 Units Subcutaneous QHS Vassie Loll, MD   2 Units at 01/22/11 2254  . insulin glargine (LANTUS) injection 8 Units  8 Units Subcutaneous Q0700 Simbiso Ranga   8 Units at 01/23/11 0740  . lactated ringers infusion   Intravenous Continuous Simbiso Ranga 20 mL/hr at 01/22/11 2330    . lidocaine (XYLOCAINE) 2 % jelly   Urethral Once [employer]      . loratadine (CLARITIN) tablet 10 mg  10 mg Oral Once Rolan Lipa   10 mg at 01/14/11 2327  . loratadine (CLARITIN) tablet 10 mg  10 mg Oral Daily Michelle A. Matthews   10 mg at 01/22/11 1028  . LORazepam (ATIVAN) 2 MG/ML injection           . LORazepam (ATIVAN) injection 1 mg  1 mg Intravenous Once Skip Estimable      . LORazepam (ATIVAN) injection 1 mg  1 mg Intravenous  Once Shanker Ghimire   1 mg at 01/09/11 0918  . LORazepam (ATIVAN) injection 1 mg  1 mg Intravenous Once Shanker Ghimire   1 mg at 01/09/11 0948  . LORazepam (ATIVAN) injection 2 mg  2 mg Intravenous Once Thomasene Lot, PA   1 mg at 01/08/11 1427  . menthol-cetylpyridinium (CEPACOL) lozenge 3 mg  1 lozenge Oral PRN Cristi Loron       Or  . phenol (CHLORASEPTIC) mouth spray 1 spray  1 spray Mouth/Throat PRN Cristi Loron      . methocarbamol (ROBAXIN) tablet 500 mg  500 mg Oral Q6H PRN Vassie Loll, MD      .  metoprolol succinate (TOPROL-XL) 24 hr tablet 12.5 mg  12.5 mg Oral BID Christina Rama   12.5 mg at 01/22/11 2255  . midazolam (VERSED) injection 0.5-2 mg  0.5-2 mg Intravenous Once PRN Judie Petit, MD      . morphine 4 MG/ML injection 1-4 mg  1-4 mg Intravenous Q3H PRN Cristi Loron   4 mg at 01/22/11 2256  . ondansetron (ZOFRAN) tablet 4 mg  4 mg Oral Q6H PRN Jacqulyn Ducking. Cammarata   4 mg at 01/13/11 1458   Or  . ondansetron (ZOFRAN) injection 4 mg  4 mg Intravenous Q6H PRN Skip Estimable      . ondansetron (ZOFRAN) injection 4 mg  4 mg Intravenous Q4H PRN Cristi Loron      . oxyCODONE (Oxy IR/ROXICODONE) immediate release tablet 5 mg  5 mg Oral Q4H PRN Jacqulyn Ducking. Cammarata   5 mg at 01/20/11 1821  . pantoprazole (PROTONIX) EC tablet 40 mg  40 mg Oral BID AC Simbiso Ranga   40 mg at 01/22/11 1905  . polyethylene glycol (MIRALAX / GLYCOLAX) packet 17 g  17 g Oral BID Shanker Ghimire   17 g at 01/22/11 2315  . polyvinyl alcohol (LIQUIFILM TEARS) 1.4 % ophthalmic solution 1 drop  1 drop Left Eye Q4H PRN Michelle A. Matthews   1 drop at 01/16/11 1628  . promethazine (PHENERGAN) injection 6.25-12.5 mg  6.25-12.5 mg Intravenous Q15 min PRN Judie Petit, MD      . simethicone [facility]) chewable tablet 80 mg  80 mg Oral QID Duane Lope Jenkins   80 mg at 01/22/11 2254  . simvastatin (ZOCOR) tablet 20 mg  20 mg Oral q1800 Jacqulyn Ducking. Cammarata   20 mg at 01/22/11 1639    . sodium chloride 0.9 % bolus 1,000 mL  1,000 mL Intravenous Once Thomasene Lot, PA   1,000 mL at 01/08/11 1156  . sodium chloride 0.9 % infusion           . sodium chloride 0.9 % injection 3 mL  3 mL Intravenous Q12H Cristi Loron   3 mL at 01/22/11 2310  . sodium chloride 0.9 % injection 3 mL  3 mL Intravenous PRN Cristi Loron      . sodium phosphate (FLEET) 7-19 GM/118ML enema 1 enema  1 enema Rectal Once Michelle A. Matthews   1 enema at 01/14/11 1806  . Tamsulosin HCl (FLOMAX) capsule 0.4 mg  0.4 mg Oral BID Jacqulyn Ducking. Cammarata   0.4 mg at 01/22/11 2254  . zolpidem (AMBIEN) tablet 5 mg  5 mg Oral QHS PRN Gery Pray, MD   5 mg at 01/21/11 2138  . zolpidem (AMBIEN) tablet 5 mg  5 mg Oral QHS PRN Cristi Loron   5 mg at 01/22/11 2314  . DISCONTD: aspirin EC tablet 81 mg  81 mg Oral Daily Skip Estimable      . DISCONTD: bacitracin ointment    PRN Cristi Loron   1 application at 01/16/11 1610  . DISCONTD: bethanechol (URECHOLINE) tablet 25 mg  25 mg Oral BID Jacqulyn Ducking. Cammarata   25 mg at 01/11/11 1037  . DISCONTD: bupivacaine-EPINEPHrine (MARCAINE W/ EPI) 0.5 % (with pres) injection    PRN Cristi Loron   10 mL at 01/16/11 0749  . DISCONTD: ceFAZolin (ANCEF) IVPB 1 g/50 mL premix  1 g Intravenous Once Cristi Loron      . DISCONTD: cetirizine (ZYRTEC) chewable tablet 5 mg  5 mg Oral QHS Skip Estimable      . DISCONTD: ciprofloxacin (CIPRO) tablet 500 mg  500 mg Oral BID Michelle A. Matthews   500 mg at 01/20/11 0836  . DISCONTD: dexamethasone (DECADRON) injection 6 mg  6 mg Intravenous Q6H Shanker Ghimire      . DISCONTD: dexamethasone (DECADRON) injection 8 mg  8 mg Intravenous Q6H Shanker Ghimire      . DISCONTD: dexamethasone (DECADRON) injection 8 mg  8 mg Intravenous Q6H Shanker Ghimire      . DISCONTD: dexamethasone (DECADRON) injection 8 mg  8 mg Intravenous Q6H Shanker Ghimire   8 mg at 01/11/11 0610  . DISCONTD: hemostatic agents    PRN  Cristi Loron   1 application at 01/16/11 1610  . DISCONTD: hydroxypropyl methylcellulose (ISOPTO TEARS) 2.5 % ophthalmic solution 1 drop  1 drop Left Eye QID PRN Michelle A. Ashley Royalty      . DISCONTD: loratadine (CLARITIN) tablet 10 mg  10 mg Oral Daily Eugene Garnet, PHARMD   10 mg at 01/12/11 1055  . DISCONTD: loratadine (CLARITIN) tablet 10 mg  10 mg Oral Daily Christina Rama   10 mg at 01/13/11 1457  . DISCONTD: metoprolol succinate (TOPROL-XL) 24 hr tablet 12.5 mg  12.5 mg Oral Daily Jacqulyn Ducking. Cammarata   12.5 mg at 01/13/11 1137  . DISCONTD: metoprolol succinate (TOPROL-XL) 24 hr tablet 12.5 mg  12.5 mg Oral BID Cristi Loron      . DISCONTD: NON FORMULARY   Mouth/Throat 1 day or 1 dose Cristi Loron      . DISCONTD: pantoprazole (PROTONIX) EC tablet 40 mg  40 mg Oral Daily Jacqulyn Ducking. Cammarata   40 mg at 01/22/11 1028  . DISCONTD: thrombin kit 5000 units    PRN Cristi Loron   5,000 Units at 01/16/11 9604   Medications Prior to Admission  Medication Sig Dispense Refill  . aspirin 81 MG EC tablet Take 81 mg by mouth daily. Stop taking before surgery      . bethanechol (URECHOLINE) 25 MG tablet Take 25 mg by mouth 2 (two) times daily.        . Cetirizine HCl (ZYRTEC PO) Take by mouth daily.       Marland Kitchen lovastatin (MEVACOR) 40 MG tablet Take 40 mg by mouth at bedtime.        . metoprolol succinate (TOPROL-XL) 25 MG 24 hr tablet Take 12.5 mg by mouth 2 (two) times daily.        Marland Kitchen NEXIUM 40 MG capsule Take 40 mg by mouth daily before breakfast.       . Tamsulosin HCl (FLOMAX) 0.4 MG CAPS Take 0.4 mg by mouth 2 (two) times daily.         Home: Home Living Lives With: Spouse Receives Help From: Family;Other (Comment) (PRN.  Son lives down the street.) Type of Home: House Home Layout: One level Home Access: Stairs to enter Entrance Stairs-Rails: Left Entrance Stairs-Number of Steps: 3 Bathroom Shower/Tub: Engineer, manufacturing systems: Standard Bathroom  Accessibility: Yes Home Adaptive Equipment: Bedside commode/3-in-1;Walker - rolling;Tub transfer bench;Straight cane   Functional History: Prior Function Level of Independence: Independent with basic ADLs;Independent with homemaking with ambulation;Requires assistive device for independence;Independent with transfers Able to Take Stairs?: Yes Vocation: Retired  Functional Status:  Mobility: Bed Mobility Bed Mobility: Yes Rolling Left: 1: +2 Total assist;Patient percentage (comment) (+2 total assist (pt=50%)) Rolling Left Details (indicate cue type and reason): Assist to  flexion of bilateral LEs with facilitation at trunk for rotation.  Cues throughout for sequence. Left Sidelying to Sit: 1: +2 Total assist;Patient percentage (comment);HOB flat (+2 total assist (pt=50%)) Left Sidelying to Sit Details (indicate cue type and reason): Assist for trunk to achieve midline with faciltation to bilateral LEs for off EOB.  Cues for sequence. Transfers Transfers: Yes Sit to Stand: 1: +2 Total assist;Patient percentage (comment);From bed;With upper extremity assist (+2 total assist (pt=50%); 2 trials.) Sit to Stand Details (indicate cue type and reason): Assist at sacrum for anterior translation with cues for hand placement and extension at hips/knees. Stand to Sit: 1: +2 Total assist;Patient percentage (comment);To chair/3-in-1;With upper extremity assist;To bed (+2 total assist (pt=50%); 2 trials.) Stand to Sit Details: Assist for eccentric descent to chair with cues for hand placement and slow contraction. Ambulation/Gait Ambulation/Gait: Yes Ambulation/Gait Assistance: 1: +2 Total assist;Patient percentage (comment) (+2 total assist (pt=40%)) Ambulation/Gait Assistance Details (indicate cue type and reason): Assist for balance with facilitation at sacrum to increase hip extension while blocking bilateral knees to prevent flexion.  Occasional hyperextension of left knee. Ambulation Distance (Feet):  2 Feet Assistive device: 2 person hand held assist Gait Pattern: Decreased step length - right;Decreased step length - left;Decreased dorsiflexion - right;Decreased weight shift to right;Decreased weight shift to left;Trunk flexed;Shuffle Stairs: No Wheelchair Mobility Wheelchair Mobility: No  ADL: ADL Eating/Feeding: Performed;Modified independent Where Assessed - Eating/Feeding: Chair Grooming: Performed;Wash/dry face;Set up;Moderate assistance Where Assessed - Grooming: Sitting, bed (mod-total assist for balance edge of bed) Upper Body Bathing: Simulated;Supervision/safety Where Assessed - Upper Body Bathing: Sitting, chair;Supported Lower Body Bathing: Simulated;Moderate assistance Where Assessed - Lower Body Bathing: Sit to stand from bed Upper Body Dressing: Simulated;Supervision/safety Where Assessed - Upper Body Dressing: Sitting, bed Lower Body Dressing: +2 Total assistance Where Assessed - Lower Body Dressing: Sitting, bed Toilet Transfer: Simulated;Moderate assistance Toilet Transfer Method: Stand pivot Toilet Transfer Equipment:  (Bedside chair) Toileting - Clothing Manipulation: Simulated;Moderate assistance Where Assessed - Toileting Clothing Manipulation:  (Sit to stand from bedside chair.) Toileting - Hygiene: Simulated Where Assessed - Toileting Hygiene:  (Sit to stand from chair) Tub/Shower Transfer: Not assessed Tub/Shower Transfer Method: Not assessed Equipment Used: Rolling walker ADL Comments: Pt overall needs mod to max assist for selfcare and toileting tasks.  Will benefit from AE instruction secondary to exhibiting decreased flexibility and having cervical precautions to follow.  Mod assist for sit to stand throughout session.  Cognition: Cognition Arousal/Alertness: Awake/alert Orientation Level: Oriented X4 Cognition Arousal/Alertness: Awake/alert Overall Cognitive Status: Appears within functional limits for tasks assessed Orientation Level:  Oriented X4   Blood pressure 157/82, pulse 69, temperature 97.4 F (36.3 C), temperature source Oral, resp. rate 18, height 6' (1.829 m), weight 81.466 kg (179 lb 9.6 oz), SpO2 98.00%. Physical Exam  Constitutional: He is oriented to person, place, and time. He appears well-developed.  HENT:  Head: Normocephalic.  Neck:  Collar in place  Cardiovascular: Normal rate.  Pulmonary/Chest: Breath sounds normal. He has no wheezes.  Abdominal: He exhibits no distension. There is no tenderness.  Musculoskeletal: He exhibits no edema.  Neurological: He is alert and oriented to person, place, and time. A sensory deficit is present. Coordination and gait abnormal.  Reflex Scores:  Tricep reflexes are 1+ on the right side and 1+ on the left side.  Bicep reflexes are 1+ on the right side and 1+ on the left side.  Brachioradialis reflexes are 1+ on the right side and 1+ on the left side.  Patellar reflexes are 1+ on the right side and 1+ on the left side.  Achilles reflexes are 1+ on the right side and 1+ on the left side. Patient with 4/5 strength in both upper extremities grossly. He had some diminishment of his fine touch in the hands right more than left. In the lower extremities strength was tr/5 proximally at the right hip flexors 1-tr at the right knee extensors and flexors and  to tr-1/5 right at the ankle dorsiflexors and plantar flexors. Left leg 1-1+/5 generally.  He had some diminishment of his leg sensation at 1 plus out of 2. Fine motor coordination of the upper extremities was fair.  Cognitively he was alert and oriented x 3 with extra time, but was slow to process .  CN exam unremarkable 2-12.  DTR's 3+ in lower ext's and resting tone at knees and ankles of 1+ /4. Skin: Skin is warm and dry and intact.  C-collar fitting appropriately. Psychiatric: Affect was flat but he was generally pleasant.   Results for orders placed during the hospital encounter of 01/08/11 (from the past 48 hour(s))   GLUCOSE, CAPILLARY     Status: Abnormal   Collection Time   01/21/11 12:17 PM      Component Value Range Comment   Glucose-Capillary 201 (*) 70 - 99 (mg/dL)    Comment 1 Documented in Chart      Comment 2 Notify RN     GLUCOSE, CAPILLARY     Status: Abnormal   Collection Time   01/21/11  4:12 PM      Component Value Range Comment   Glucose-Capillary 213 (*) 70 - 99 (mg/dL)    Comment 1 Notify RN     GLUCOSE, CAPILLARY     Status: Abnormal   Collection Time   01/21/11 10:19 PM      Component Value Range Comment   Glucose-Capillary 244 (*) 70 - 99 (mg/dL)    Comment 1 Documented in Chart      Comment 2 Notify RN     GLUCOSE, CAPILLARY     Status: Abnormal   Collection Time   01/22/11  6:51 AM      Component Value Range Comment   Glucose-Capillary 174 (*) 70 - 99 (mg/dL)    Comment 1 Documented in Chart      Comment 2 Notify RN     CREATININE, SERUM     Status: Abnormal   Collection Time   01/22/11  7:03 AM      Component Value Range Comment   Creatinine, Ser 0.73  0.50 - 1.35 (mg/dL)    GFR calc non Af Amer 89 (*) >90 (mL/min)    GFR calc Af Amer >90  >90 (mL/min)   GLUCOSE, CAPILLARY     Status: Abnormal   Collection Time   01/22/11 11:50 AM      Component Value Range Comment   Glucose-Capillary 224 (*) 70 - 99 (mg/dL)    Comment 1 Documented in Chart      Comment 2 Notify RN     GLUCOSE, CAPILLARY     Status: Abnormal   Collection Time   01/22/11  4:20 PM      Component Value Range Comment   Glucose-Capillary 323 (*) 70 - 99 (mg/dL)    Comment 1 Documented in Chart      Comment 2 Notify RN     GLUCOSE, CAPILLARY     Status: Abnormal   Collection Time   01/22/11 10:40  PM      Component Value Range Comment   Glucose-Capillary 248 (*) 70 - 99 (mg/dL)    Comment 1 Notify RN     CBC     Status: Abnormal   Collection Time   01/23/11  7:45 AM      Component Value Range Comment   WBC 20.3 (*) 4.0 - 10.5 (K/uL)    RBC 4.76  4.22 - 5.81 (MIL/uL)    Hemoglobin 13.9  13.0 -  17.0 (g/dL)    HCT 96.0 (*) 45.4 - 52.0 (%)    MCV 81.7  78.0 - 100.0 (fL)    MCH 29.2  26.0 - 34.0 (pg)    MCHC 35.7  30.0 - 36.0 (g/dL)    RDW 09.8  11.9 - 14.7 (%)    Platelets 195  150 - 400 (K/uL)    No results found.  Post Admission Physician Evaluation: 1. Functional deficits secondary  to C6-7 stenosis with incomplete myelopathy, s/p ACDF and decompression  2. Patient is admitted to receive collaborative, interdisciplinary care between the physiatrist, rehab nursing staff, and therapy team. 3. Patient's level of medical complexity and substantial therapy needs in context of that medical necessity cannot be provided at a lesser intensity of care such as a SNF. 4. Patient has experienced substantial functional loss from his/her baseline which was documented above under the "Functional History" and "Functional Status" headings.  Judging by the patient's diagnosis, physical exam, and functional history, the patient has potential for functional progress which will result in measurable gains while on inpatient rehab.  These gains will be of substantial and practical use upon discharge  in facilitating mobility and self-care at the household level. 5. Physiatrist will provide 24 hour management of medical needs as well as oversight of the therapy plan/treatment and provide guidance as appropriate regarding the interaction of the two. 6. 24 hour rehab nursing will assist with bladder management, bowel management, safety, skin/wound care, disease management, medication administration, pain management and patient education  and help integrate therapy concepts, techniques,education, etc. 7. PT will assess and treat for: balance, endurance, locomotion, strength and transferring. Goals are: minimal assist to moderate assist  8. OT will assess and treat for: balance, bathing, bowel/bladder control, dressing, endurance, grooming, locomotion, toileting and transferring .  Goals are: minimal assist to  moderate assist.  9. SLP will assess and treat for: not app.  Goals are: N/A. 10. Case Management and Social Worker will assess and treat for psychological issues and discharge planning. 11. Team conference will be held weekly to assess progress toward goals and to determine barriers to discharge. 12.  Patient will receive at least 3 hours of therapy per day at least 5 days per week. 13. ELOS and Prognosis: 3 plus weeks good   Medical Problem List and Plan: 1.Cervical Myelopathy-S/Pcervical diskectomy with decompression 12/3.Cervical collar at all times.Decadron taper as directed.  Will discuss clinical presentation with NS as he is weaker than when I examined him during my consult.  2. DVT Prophylaxis/Anticoagulation: SCD/support hose.No current signs of DVT. Needs lower ext dopplers  3. Pain Management: Oxycodone/robaxin.Monitor with increased activity. No pain currently at rest.  4. Neurogenic bladder and hematuria/BPH-.Foley tube in place with hematuria resolving.Continue flomax for now.Empiric keflex 12/7 and plan follow up urine study.Will discuss voiding trial with urology services.  5.Steriod induced hyperglycemia-check blood sugars ac/hs and monitor as taper steroids.  6.Depression-celexa daily.Provide emotional support.  7.GERD-protonix  8.Hyperlipidemia-zocor    01/23/2011, 9:11 AM

## 2011-01-23 NOTE — Progress Notes (Signed)
Clinical social work reviewed patient chart, pt to dc to Inpatient Rehab, no further csw needs, signing off.    Catha Gosselin, Theresia Majors  [phone] .01/23/2011 12:52pm

## 2011-01-23 NOTE — Preoperative (Signed)
Beta Blockers   Reason not to administer Beta Blockers:Pt. received metoprolol@1830hrs . today

## 2011-01-23 NOTE — Progress Notes (Signed)
Patient ID: Ricky Bradley, male   DOB: [birth year], 77 y.o.   MRN: [id number] Subjective:  The patient is alert. He is at times mildly somnolent. He tells me he is weaker all over. He does not recall any precipitating event.  Objective: Vital signs in last 24 hours: Temp:  [97.1 F (36.2 C)-98.4 F (36.9 C)] 97.7 F (36.5 C) (12/10 1000) Pulse Rate:  [69-92] 69  (12/10 1000) Resp:  [16-22] 19  (12/10 1000) BP: (124-157)/(69-87) 124/79 mmHg (12/10 1000) SpO2:  [96 %-98 %] 96 % (12/10 1000)  Intake/Output from previous day: 12/09 0701 - 12/10 0700 In: 260 [I.V.:140] Out: 2100 [Urine:2100] Intake/Output this shift: Total I/O In: -  Out: 1000 [Urine:1000]  Physical exam the patient is alert and oriented. His motor strength is 4+ over 5 his bile biceps, 0-1/5 in his bilateral hand grips, right gastrocnemius is 4 minus over 5, left gastrocnemius is 4+ over 5, left dorsiflexors are 4 minus over 5, right dorsiflexors 0/5  Lab Results:  Basename 01/23/11 0745 01/21/11 0500  WBC 20.3* 15.1*  HGB 13.9 13.3  HCT 38.9* 37.9*  PLT 195 179   BMET  Basename 01/23/11 0745 01/22/11 0703  NA 128* --  K 4.7 --  CL 91* --  CO2 29 --  GLUCOSE 211* --  BUN 25* --  CREATININE 0.73 0.73  CALCIUM 8.7 --    Studies/Results: No results found.  Assessment/Plan: Quadraparesis, central cord syndrome: I discussed situation with the patient and his daughter. The patient seems significantly weaker today than when I saw him on Friday. On going to get a stat cervical MRI to rule out hematoma, et Karie Soda. He also has some posterior stenosis. He requests sedation for the scan.  LOS: 15 days     Ivannia Willhelm D 01/23/2011, 11:45 AM

## 2011-01-23 NOTE — Anesthesia Procedure Notes (Addendum)
Procedure Name: Intubation Date/Time: 01/23/2011 8:15 PM Performed by: Wray Kearns A Pre-anesthesia Checklist: Patient identified, Timeout performed, Emergency Drugs available, Suction available and Patient being monitored Patient Re-evaluated:Patient Re-evaluated prior to inductionOxygen Delivery Method: Circle System Utilized Preoxygenation: Pre-oxygenation with 100% oxygen Intubation Type: IV induction, Rapid sequence and Circoid Pressure applied Ventilation: Mask ventilation without difficulty Laryngoscope Size: Mac and 3 Grade View: Grade I Tube type: Oral Tube size: 7.5 mm Number of attempts: 1 Airway Equipment and Method: stylet and C-Track utilized Placement Confirmation: ETT inserted through vocal cords under direct vision,  breath sounds checked- equal and bilateral and positive ETCO2 Secured at: 21 cm Tube secured with: Tape Dental Injury: Teeth and Oropharynx as per pre-operative assessment

## 2011-01-23 NOTE — Progress Notes (Signed)
Subjective:  The patient is somnolent but arousable. He is in no apparent stress.  Objective: Vital signs in last 24 hours: Temp:  [97.3 F (36.3 C)-98.4 F (36.9 C)] 97.3 F (36.3 C) (12/10 1747) Pulse Rate:  [68-90] 69  (12/10 1747) Resp:  [16-20] 20  (12/10 1747) BP: (123-157)/(72-90) 146/90 mmHg (12/10 1747) SpO2:  [96 %-98 %] 98 % (12/10 2145)  Intake/Output from previous day: 12/09 0701 - 12/10 0700 In: 260 [I.V.:140] Out: 2100 [Urine:2100] Intake/Output this shift: Total I/O In: 700 [I.V.:700] Out: 100 [Blood:100]  Physical exam patient is Glasgow Coma Scale 12. He is moving all 4 extremities. His hands seem stronger since surgery but they are still weak.  The patient's dressing is clean and dry. There is no evidence of hematoma or shift.  Lab Results:  Basename 01/23/11 0745 01/21/11 0500  WBC 20.3* 15.1*  HGB 13.9 13.3  HCT 38.9* 37.9*  PLT 195 179   BMET  Basename 01/23/11 0745 01/22/11 0703  NA 128* --  K 4.7 --  CL 91* --  CO2 29 --  GLUCOSE 211* --  BUN 25* --  CREATININE 0.73 0.73  CALCIUM 8.7 --    Studies/Results: Dg Cervical Spine 1 View  01/23/2011  *RADIOLOGY REPORT*  Clinical Data: Hematoma.  DG SPINE PORTABLE - 1 VIEW  Comparison: 01/16/2011  Findings: Cross-table lateral view of the cervical spine obtained. C7 and T1 are not visualized due to soft tissue attenuation. Postoperative changes with anterior plate and screw fixation with intervertebral fusion at C6-7, incompletely visualized on this film.  This has been placed since the previous intraoperative study.  As visualized, the cervical vertebra appear to be in normal alignment.  Prominent degenerative changes throughout cervical spine with prominent bridging anterior osteophytes and probable ankylosis or  fusion at C4-5 and C5-6 levels. NG and endotracheal tubes are in place.  IMPRESSION: Postoperative changes at C6-7, incompletely visualized.  Diffuse degenerative changes.  Normal  alignment of visualized vertebrae.  Original Report Authenticated By: Marlon Pel, M.D.   Mr Cervical Spine Wo Contrast  01/23/2011  *RADIOLOGY REPORT*  Clinical Data: 77 year old male with quadriparesis.  Neck pain.  MRI CERVICAL SPINE WITHOUT CONTRAST  Technique:  Multiplanar and multiecho pulse sequences of the cervical spine, to include the craniocervical junction and cervicothoracic junction, were obtained according to standard protocol without intravenous contrast.  Comparison: Intraoperative radiographs 01/16/2011.  Preoperative study 01/09/2011.  Findings: The patient is undergone interval C6-C7 ACDF.  However, cord compression at this level has not abated and appears somewhat worse as a result of a broad-based ventral epidural space occupying lesion which is nearly isointense to the spinal cord on T1 and STIR images, and dark on sagittal T2 (series 3 image 70). The ventral epidural collection tracks from the C5-C6 disc space to the C7-T1 disc space.  The thecal sac is virtually obliterated at this level (series 6 image 17) when combined with the preexisting severe ligament flavum hypertrophy at this level.  There is abnormal signal in the spinal cord, which to a degree appears similar to that on the preoperative study.  Prevertebral soft tissues are within normal limits.  Mild susceptibility artifact related to the C6-C7 ACDF hardware is noted.  No definite marrow edema.  Severe facet hypertrophy at C6- C7 with trace fluid in the facet joints is re-identified.  Above and below the C6-C7 level the cervical spinal canal is stable.  The visualized upper thoracic canal remains widely patent.  Bulky flowing ventral  cervical osteophytes are re-identified. Cervicomedullary junction is within normal limits.  Stable right thyroid nodule.  IMPRESSION: 1.  Interval C6-C7 ACDF with continued/progressed spinal cord compression at this level now in large part to a broad-based ventral epidural process, favor  epidural hematoma. Critical Value/emergent results were called by telephone at the time of interpretation on 01/23/2011  at 1643 hours  to  Dr. Delma Officer in the neuro OR, who verbally acknowledged these results. 2.  Stable cervical levels elsewhere.  Original Report Authenticated By: Harley Hallmark, M.D.    Assessment/Plan: Cervical epidural hematoma, cervical myelopathy, Quadraparesis: The patient seems to have improved since surgery. I am going to have him observed in the ICU. I've spoken to the patient's family and updated them on his condition.  LOS: 15 days     Kevina Piloto D 01/23/2011, 10:01 PM

## 2011-01-23 NOTE — Progress Notes (Signed)
I am informed the patient was given a few tablespoons of apple sauce and water to swallow his medicationsabout an hour ago. I discussed this with the anesthesiologist. Given the patient's severe spinal cord compression and worsening neurologic status I don't think we can wait 6 hours to do the surgery. We will do it emergently now.

## 2011-01-23 NOTE — Progress Notes (Signed)
I met with patient and daughter at bedside. Noted hematuria resolved. We will plan admit to CIR today. I have notified CM to contact the attending MD for d/c to inpatient acute rehabilitation today. Pager [phone].

## 2011-01-23 NOTE — Anesthesia Preprocedure Evaluation (Addendum)
Anesthesia Evaluation  Patient identified by MRN, date of birth, ID band  Reviewed: Allergy & Precautions, H&P , NPO status , Patient's Chart, lab work & pertinent test results  Airway       Dental   Pulmonary          Cardiovascular hypertension,     Neuro/Psych    GI/Hepatic   Endo/Other    Renal/GU      Musculoskeletal   Abdominal   Peds  Hematology   Anesthesia Other Findings   Reproductive/Obstetrics                           Anesthesia Physical Anesthesia Plan  ASA: III and Emergent  Anesthesia Plan: General   Post-op Pain Management:    Induction: Intravenous, Rapid sequence and Cricoid pressure planned  Airway Management Planned: Oral ETT  Additional Equipment:   Intra-op Plan:   Post-operative Plan: Extubation in OR  Informed Consent: I have reviewed the patients History and Physical, chart, labs and discussed the procedure including the risks, benefits and alternatives for the proposed anesthesia with the patient or authorized representative who has indicated his/her understanding and acceptance.     Plan Discussed with: CRNA and Surgeon  Anesthesia Plan Comments: (Pt had applesauce and water 1 hour agio to take meds. Dr Lovell Sheehan declares patient an emergency and cannot wait. RSI needed.)        Anesthesia Quick Evaluation

## 2011-01-23 NOTE — Anesthesia Postprocedure Evaluation (Signed)
  Anesthesia Post-op Note  Patient: Ricky Bradley  Procedure(s) Performed:  ANTERIOR CERVICAL DECOMPRESSION/DISCECTOMY FUSION 1 LEVEL/HARDWARE REMOVAL - Evacuation of Cervical Six-Seven Hematoma  Patient Location: PACU  Anesthesia Type: General  Level of Consciousness: sedated  Airway and Oxygen Therapy: Patient connected to nasal cannula oxygen  Post-op Pain: none  Post-op Assessment: Post-op Vital signs reviewed  Post-op Vital Signs: stable  Complications: No apparent anesthesia complications

## 2011-01-23 NOTE — Progress Notes (Signed)
Inpatient Diabetes Program Recommendations  AACE/ADA: New Consensus Statement on Inpatient Glycemic Control (2009)  Target Ranges:  Prepandial:   less than 140 mg/dL      Peak postprandial:   less than 180 mg/dL (1-2 hours)      Critically ill patients:  140 - 180 mg/dL   Reason for Visit: CBG's continue to be elevated while on steroids.  Inpatient Diabetes Program Recommendations Correction (SSI): - Insulin - Meal Coverage: While on steroids, consider adding Novolog meal coverage 4 units tid with meals. HgbA1C: -

## 2011-01-23 NOTE — Op Note (Signed)
Brief history: The patient is a 77 year old white male who I performed a C6-7 intracervical discectomy fusion plating on 7 days ago secondary to his severe cervical myelopathy. The patient initially did well but today was found to be quadriparetic with severe weakness in his hands. I suspected a cervical hematoma and worked him up with a cervical MRI. This confirmed the hematoma. I discussed situation with the patient as well as his son and daughter. I recommend that he undergo a back ration of cervical epidural hematoma. I explained the procedure as well as the risk, benefits, alternatives and likelihood of achieving our goals. I advanced all her questions they want to proceed with the operation.  Preop diagnosis: Quadraparesis, cervical epidural hematoma  Postop diagnosis: The same.  Procedure: Evacuation of cervical epidural hematoma using microdissection  Surgeon: Dr. Delma Officer  Asst.: Dr. Orbie Hurst  Anesthesia: Gen. endotracheal  Estimated blood loss is 25 cc  Specimens: None  Drains: None  Occasions: None  Description of procedure: The patient was brought to the operating room by the anesthesia team. General endotracheal anesthesia was induced. The patient remained in the supine position. His head and neck were kept in the neutral position. I removed the patient's Steri-Strips. Patient's anterior neck was then prepared with Betadine scrub and Betadine solution. Sterile drapes were applied.  I then used a scalpel to incise the patient's fresh surgical incision. I used Metzenbaum scissors to divide the platysmal muscle and to dissect medial to sternocleidomastoid muscle jugular vein and carotid artery. We did not encounter any significant prevertebral hematoma. I exposed the plate and screws. We removed the plate and screws. I inserted the Caspar self-retaining retractor underneath the longus colli muscle bilaterally to provide exposure. I inserted distraction screws into the old  screw holes the distracted C6-7 interspace. I then removed the interbody prosthesis. Upon removing the prosthesis we identified a large epidural hematoma which was somewhat tenacious. Remove it using suction and irrigation. The the dura came back nicely. I then carefully palpated along the ventral surface of the dura just posterior to the C6 and C7 vertebral body with a small hooks. There did not appear to be any more epidural hematoma. We did not encounter any active bleeding points. At this point had a good decompression the dura and excellent hemostasis. I then reinserted the MR prosthesis into the distracted C6-7 interspace. I then replaced the plate and secured it with rescue screws. We got good bony purchase. We then obtained intraoperative radiograph which demonstrated good position of the plate screws and a by prosthesis. We secured the screws the plate the locking each cam. Then removed the retractor and inspect the esophagus for any damage. There was was none apparent. I then reapproximated patient's platysmal muscle with interrupted 3-0 Vicryl suture. I then reapproximated patient's subcutaneous tissue with interrupted 3-0 Vicryl suture. We then reapproximated patient's skin with Steri-Strips and benzoin. The wound is then coated with bacitracin ointment. A sterile dressing was applied, the drapes were then removed. The patient was subtotally exited by the anesthesia team and transported to the post anesthesia care he in stable condition. All sponge instrument and needle counts were correct at the end this case.

## 2011-01-23 NOTE — Progress Notes (Signed)
Noted pt for MRI, will await results before planning admit to CIR. Pager [phone].

## 2011-01-23 NOTE — Transfer of Care (Signed)
Immediate Anesthesia Transfer of Care Note  Patient: Ricky Bradley  Procedure(s) Performed:  ANTERIOR CERVICAL DECOMPRESSION/DISCECTOMY FUSION 1 LEVEL/HARDWARE REMOVAL - Evacuation of Cervical Six-Seven Hematoma  Patient Location: PACU  Anesthesia Type: General  Level of Consciousness: awake and patient cooperative  Airway & Oxygen Therapy: Patient Spontanous Breathing and Patient connected to nasal cannula oxygen  Post-op Assessment: Report given to PACU RN, Post -op Vital signs reviewed and stable and Patient moving all extremities  Post vital signs: Reviewed and stable  Complications: No apparent anesthesia complications

## 2011-01-24 LAB — CBC
HCT: 37.9 % — ABNORMAL LOW (ref 39.0–52.0)
Hemoglobin: 13.3 g/dL (ref 13.0–17.0)
MCV: 82.2 fL (ref 78.0–100.0)
Platelets: 170 10*3/uL (ref 150–400)
RBC: 4.61 MIL/uL (ref 4.22–5.81)
WBC: 17.4 10*3/uL — ABNORMAL HIGH (ref 4.0–10.5)

## 2011-01-24 LAB — BASIC METABOLIC PANEL
BUN: 28 mg/dL — ABNORMAL HIGH (ref 6–23)
CO2: 29 mEq/L (ref 19–32)
Chloride: 90 mEq/L — ABNORMAL LOW (ref 96–112)
Creatinine, Ser: 0.73 mg/dL (ref 0.50–1.35)
Glucose, Bld: 242 mg/dL — ABNORMAL HIGH (ref 70–99)

## 2011-01-24 LAB — MRSA PCR SCREENING: MRSA by PCR: NEGATIVE

## 2011-01-24 LAB — GLUCOSE, CAPILLARY
Glucose-Capillary: 188 mg/dL — ABNORMAL HIGH (ref 70–99)
Glucose-Capillary: 208 mg/dL — ABNORMAL HIGH (ref 70–99)

## 2011-01-24 LAB — PROTIME-INR: Prothrombin Time: 13.9 seconds (ref 11.6–15.2)

## 2011-01-24 MED ORDER — INSULIN GLARGINE 100 UNIT/ML ~~LOC~~ SOLN
10.0000 [IU] | SUBCUTANEOUS | Status: DC
  Administered 2011-01-25 – 2011-01-27 (×3): 10 [IU] via SUBCUTANEOUS
  Filled 2011-01-24: qty 3

## 2011-01-24 NOTE — Progress Notes (Signed)
Noted surgery last night. I will follow patient's progress with therapy to determine timing of admission to inpatient acute rehabilitation. Please call me with any questions. Pager [phone].

## 2011-01-24 NOTE — Progress Notes (Signed)
OT Cancellation Note  Treatment cancelled today due to patient's refusal to participate: Patient had just worked with PT and returned to bed. Refusing OT at this time secondary to fatigue. Will check later as schedule allows.  Glendale Chard    OTR/L Pager: [phone] 01/24/2011

## 2011-01-24 NOTE — Progress Notes (Signed)
Patient ID: Ricky Bradley, male   DOB: [birth year], 77 y.o.   MRN: [id number] Subjective:  The patient is mildly somnolent but easily arousable. He is pleasant. He has no complaints. He feels stronger.  Objective: Vital signs in last 24 hours: Temp:  [97.3 F (36.3 C)-98.2 F (36.8 C)] 98.2 F (36.8 C) (12/11 0300) Pulse Rate:  [68-83] 75  (12/11 0600) Resp:  [13-23] 14  (12/11 0600) BP: (114-146)/(61-105) 144/80 mmHg (12/11 0600) SpO2:  [96 %-99 %] 99 % (12/11 0600) Weight:  [77.8 kg (171 lb 8.3 oz)] 171 lb 8.3 oz (77.8 kg) (12/10 2356)  Intake/Output from previous day: 12/10 0701 - 12/11 0700 In: 1350 [P.O.:360; I.V.:940; IV Piggyback:50] Out: 2455 [Urine:2355; Blood:100] Intake/Output this shift:    Physical exam the patient is Glasgow Coma Scale 14. His hand grip strength is 4 minus over 5 bilaterally, his biceps strength is 4+ over 5 bilaterally, his gastrocnemius strength is 4+ over 5 bilaterally, his left dorsiflexor is 4+ over 5, his right dorsiflexor is 4 minus over 5  The patient's dressing shows a small bloodstain. There is no evidence of hematoma or shift.  Lab Results:  [facility] 01/24/11 0315 01/23/11 0745  WBC 17.4* 20.3*  HGB 13.3 13.9  HCT 37.9* 38.9*  PLT 170 195   BMET  Basename 01/24/11 0315 01/23/11 0745  NA 129* 128*  K 4.6 4.7  CL 90* 91*  CO2 29 29  GLUCOSE 242* 211*  BUN 28* 25*  CREATININE 0.73 0.73  CALCIUM 8.3* 8.7    Studies/Results: Dg Cervical Spine 1 View  01/23/2011  *RADIOLOGY REPORT*  Clinical Data: Hematoma.  DG SPINE PORTABLE - 1 VIEW  Comparison: 01/16/2011  Findings: Cross-table lateral view of the cervical spine obtained. C7 and T1 are not visualized due to soft tissue attenuation. Postoperative changes with anterior plate and screw fixation with intervertebral fusion at C6-7, incompletely visualized on this film.  This has been placed since the previous intraoperative study.  As visualized, the cervical vertebra appear to be in  normal alignment.  Prominent degenerative changes throughout cervical spine with prominent bridging anterior osteophytes and probable ankylosis or  fusion at C4-5 and C5-6 levels. NG and endotracheal tubes are in place.  IMPRESSION: Postoperative changes at C6-7, incompletely visualized.  Diffuse degenerative changes.  Normal alignment of visualized vertebrae.  Original Report Authenticated By: Marlon Pel, M.D.   Mr Cervical Spine Wo Contrast  01/23/2011  *RADIOLOGY REPORT*  Clinical Data: 77 year old male with quadriparesis.  Neck pain.  MRI CERVICAL SPINE WITHOUT CONTRAST  Technique:  Multiplanar and multiecho pulse sequences of the cervical spine, to include the craniocervical junction and cervicothoracic junction, were obtained according to standard protocol without intravenous contrast.  Comparison: Intraoperative radiographs 01/16/2011.  Preoperative study 01/09/2011.  Findings: The patient is undergone interval C6-C7 ACDF.  However, cord compression at this level has not abated and appears somewhat worse as a result of a broad-based ventral epidural space occupying lesion which is nearly isointense to the spinal cord on T1 and STIR images, and dark on sagittal T2 (series 3 image 70). The ventral epidural collection tracks from the C5-C6 disc space to the C7-T1 disc space.  The thecal sac is virtually obliterated at this level (series 6 image 17) when combined with the preexisting severe ligament flavum hypertrophy at this level.  There is abnormal signal in the spinal cord, which to a degree appears similar to that on the preoperative study.  Prevertebral soft tissues are within  normal limits.  Mild susceptibility artifact related to the C6-C7 ACDF hardware is noted.  No definite marrow edema.  Severe facet hypertrophy at C6- C7 with trace fluid in the facet joints is re-identified.  Above and below the C6-C7 level the cervical spinal canal is stable.  The visualized upper thoracic canal remains  widely patent.  Bulky flowing ventral cervical osteophytes are re-identified. Cervicomedullary junction is within normal limits.  Stable right thyroid nodule.  IMPRESSION: 1.  Interval C6-C7 ACDF with continued/progressed spinal cord compression at this level now in large part to a broad-based ventral epidural process, favor epidural hematoma. Critical Value/emergent results were called by telephone at the time of interpretation on 01/23/2011  at 1643 hours  to  Dr. Delma Officer in the neuro OR, who verbally acknowledged these results. 2.  Stable cervical levels elsewhere.  Original Report Authenticated By: Harley Hallmark, M.D.    Assessment/Plan: Postop day #1: The patient is much stronger status post evacuation of epidural hematoma. Get PT and OT to see him. And hopefully plan to get him into rehabilitation in the next few days.  LOS: 16 days     Shagun Wordell D 01/24/2011, 7:34 AM

## 2011-01-24 NOTE — Progress Notes (Signed)
Ricky Bradley is a 77 y.o. male patient admitted on 01/08/11 with bilateral lower extremity weakness, found to have sevre neurocompression at at C6-7, hence decompression. He improved but he was noted to be weak again yesterday, hence another MRI which showed continued /progressed compression at C6-C7 due to epidural hematoma that was evacuated today. Patient has been accepted to CIR once okayed by neurosurgery. The hospital course also included a bout of hematuria which cleared with bladder irrigation. He has steroid induced hyperglycemia. Patient mostly sedated but arousal at time of encounter today. He denies any complaints.    1. Unable to ambulate   2. Weakness     Past Medical History  Diagnosis Date  . Hypertension   . GERD (gastroesophageal reflux disease)   . High cholesterol   . Enlarged prostate   . High cholesterol    Current Facility-Administered Medications  Medication Dose Route Frequency Provider Last Rate Last Dose  . acetaminophen (TYLENOL) tablet 650 mg  650 mg Oral Q6H PRN Skip Estimable       Or  . acetaminophen (TYLENOL) suppository 650 mg  650 mg Rectal Q6H PRN Skip Estimable      . alum & mag hydroxide-simeth (MAALOX/MYLANTA) 200-200-20 MG/5ML suspension 30 mL  30 mL Oral Q4H PRN Berenis Corter      . bacitracin 65784 UNITS injection           . bisacodyl (DULCOLAX) suppository 10 mg  10 mg Rectal Daily PRN Amond Speranza      . ceFAZolin (ANCEF) 1-5 GM-% IVPB           . ceFAZolin (ANCEF) IVPB 1 g/50 mL premix  1 g Intravenous Q8H Duane Lope Jenkins   1 g at 01/24/11 0631  . citalopram (CELEXA) tablet 10 mg  10 mg Oral Daily Mcarthur Rossetti. Angiulli, PA   10 mg at 01/24/11 0951  . dexamethasone (DECADRON) tablet 8 mg  8 mg Oral Q6H Daniel J. Angiulli, PA   8 mg at 01/24/11 1253  . diazepam (VALIUM) tablet 5 mg  5 mg Oral Q6H PRN Cristi Loron      . dicyclomine (BENTYL) capsule 10 mg  10 mg Oral TID AC & HS Jacqulyn Ducking. Cammarata   10 mg at 01/24/11 1253  .  docusate sodium (COLACE) capsule 100 mg  100 mg Oral BID Cristi Loron   100 mg at 01/24/11 6962  . gi cocktail  30 mL Oral BID PRN Christina Rama   30 mL at 01/14/11 1806  . insulin aspart (novoLOG) injection 0-15 Units  0-15 Units Subcutaneous TID WC Vassie Loll, MD   3 Units at 01/24/11 1256  . insulin glargine (LANTUS) injection 8 Units  8 Units Subcutaneous Q0700 Mcarthur Rossetti. Angiulli, PA   8 Units at 01/24/11 0815  . lactated ringers infusion   Intravenous Continuous Cristi Loron      . loratadine (CLARITIN) tablet 10 mg  10 mg Oral Daily Michelle A. Matthews   10 mg at 01/24/11 9528  . menthol-cetylpyridinium (CEPACOL) lozenge 3 mg  1 lozenge Oral PRN Cristi Loron       Or  . phenol (CHLORASEPTIC) mouth spray 1 spray  1 spray Mouth/Throat PRN Cristi Loron      . metoprolol succinate (TOPROL-XL) 24 hr tablet 12.5 mg  12.5 mg Oral BID Christina Rama   12.5 mg at 01/24/11 0952  . morphine 4 MG/ML injection 1-4 mg  1-4 mg Intravenous  Q3H PRN Cristi Loron      . ondansetron (ZOFRAN) injection 4 mg  4 mg Intravenous Once PRN Aubery Lapping, MD      . ondansetron [facility]) injection 4 mg  4 mg Intravenous Q4H PRN Cristi Loron      . oxyCODONE (Oxy IR/ROXICODONE) immediate release tablet 5 mg  5 mg Oral Q4H PRN Mcarthur Rossetti. Angiulli, PA   5 mg at 01/24/11 1254  . pantoprazole (PROTONIX) EC tablet 40 mg  40 mg Oral BID AC Daniel J. Angiulli, PA   40 mg at 01/24/11 0809  . polyethylene glycol (MIRALAX / GLYCOLAX) packet 17 g  17 g Oral BID Mcarthur Rossetti. Angiulli, PA   17 g at 01/24/11 0950  . polyvinyl alcohol (LIQUIFILM TEARS) 1.4 % ophthalmic solution 1 drop  1 drop Left Eye Q4H PRN Mcarthur Rossetti. Angiulli, PA      . promethazine (PHENERGAN) injection 6.25-12.5 mg  6.25-12.5 mg Intravenous Q15 min PRN Judie Petit, MD      . simethicone [facility]) chewable tablet 80 mg  80 mg Oral QID Mcarthur Rossetti. Angiulli, PA   80 mg at 01/24/11 1331  . simvastatin (ZOCOR) tablet 20 mg  20 mg  Oral q1800 Mcarthur Rossetti. Angiulli, PA      . sodium chloride 0.9 % infusion           . Tamsulosin HCl (FLOMAX) capsule 0.4 mg  0.4 mg Oral BID Mcarthur Rossetti. Angiulli, PA   0.4 mg at 01/24/11 0951  . zolpidem (AMBIEN) tablet 5 mg  5 mg Oral QHS PRN Cristi Loron   5 mg at 01/24/11 0123  . DISCONTD: 0.9 %  sodium chloride infusion  250 mL Intravenous Continuous Cristi Loron 1 mL/hr at 01/22/11 1427 500 mL at 01/22/11 1427  . DISCONTD: 50,000 units bacitracin in 0.9% normal saline 250 mL irrigation    PRN Cristi Loron      . DISCONTD: acetaminophen (TYLENOL) suppository 650 mg  650 mg Rectal Q4H PRN Cristi Loron      . DISCONTD: acetaminophen (TYLENOL) suppository 650 mg  650 mg Rectal Q4H PRN Cristi Loron      . DISCONTD: acetaminophen (TYLENOL) tablet 650 mg  650 mg Oral Q4H PRN Cristi Loron      . DISCONTD: aspirin EC tablet 81 mg  81 mg Oral Daily Eugene Garnet, PHARMD   81 mg at 01/24/11 1610  . DISCONTD: aspirin EC tablet 81 mg  81 mg Oral Daily Mcarthur Rossetti. Angiulli, PA      . DISCONTD: bacitracin ointment    PRN Cristi Loron   1 application at 01/23/11 2127  . DISCONTD: cephALEXin (KEFLEX) capsule 500 mg  500 mg Oral Q6H Vassie Loll, MD   500 mg at 01/23/11 1813  . DISCONTD: cephALEXin (KEFLEX) capsule 500 mg  500 mg Oral Q6H Daniel J. Angiulli, PA   500 mg at 01/24/11 9604  . DISCONTD: citalopram (CELEXA) tablet 10 mg  10 mg Oral Daily Jacqulyn Ducking. Cammarata   10 mg at 01/23/11 1054  . DISCONTD: dexamethasone (DECADRON) tablet 8 mg  8 mg Oral Q6H Daniel J. Angiulli, PA   8 mg at 01/23/11 1812  . DISCONTD: diazepam (VALIUM) tablet 5 mg  5 mg Oral Q6H PRN Cristi Loron   5 mg at 01/23/11 1055  . DISCONTD: dicyclomine (BENTYL) capsule 10 mg  10 mg Oral TID AC & HS Mcarthur Rossetti. Angiulli, PA      .  DISCONTD: docusate sodium (COLACE) capsule 100 mg  100 mg Oral BID Cristi Loron   100 mg at 01/23/11 1056  . DISCONTD: fentaNYL (SUBLIMAZE) injection 50 mcg  50 mcg  Intravenous Once Judie Petit, MD      . DISCONTD: hemostatic agents    PRN Cristi Loron   1 application at 01/23/11 2037  . DISCONTD: HYDROmorphone (DILAUDID) injection 0.25-0.5 mg  0.25-0.5 mg Intravenous Q5 min PRN Aubery Lapping, MD      . DISCONTD: insulin aspart (novoLOG) injection 0-15 Units  0-15 Units Subcutaneous TID WC Mcarthur Rossetti. Angiulli, PA      . DISCONTD: insulin aspart (novoLOG) injection 0-5 Units  0-5 Units Subcutaneous QHS Vassie Loll, MD   2 Units at 01/22/11 2254  . DISCONTD: insulin glargine (LANTUS) injection 10 Units  10 Units Subcutaneous Q0700 Carmelle Bamberg      . DISCONTD: insulin glargine (LANTUS) injection 8 Units  8 Units Subcutaneous Q0700 Donisha Hoch   8 Units at 01/23/11 0740  . DISCONTD: lactated ringers infusion   Intravenous Continuous Afua Hoots 20 mL/hr at 01/22/11 2330    . DISCONTD: loratadine (CLARITIN) tablet 10 mg  10 mg Oral Daily Mcarthur Rossetti. Angiulli, PA      . DISCONTD: LORazepam (ATIVAN) tablet 1 mg  1 mg Oral Once Cristi Loron      . DISCONTD: menthol-cetylpyridinium (CEPACOL) lozenge 3 mg  1 lozenge Oral PRN Cristi Loron      . DISCONTD: meperidine (DEMEROL) injection 6.25-12.5 mg  6.25-12.5 mg Intravenous Q5 min PRN Aubery Lapping, MD      . DISCONTD: methocarbamol (ROBAXIN) tablet 500 mg  500 mg Oral Q6H PRN Vassie Loll, MD      . DISCONTD: methocarbamol (ROBAXIN) tablet 500 mg  500 mg Oral Q6H PRN Mcarthur Rossetti. Angiulli, PA      . DISCONTD: methocarbamol (ROBAXIN) tablet 500 mg  500 mg Oral Q6H PRN Mcarthur Rossetti. Angiulli, PA      . DISCONTD: metoprolol succinate (TOPROL-XL) 24 hr tablet 12.5 mg  12.5 mg Oral BID Mcarthur Rossetti. Angiulli, PA   12.5 mg at 01/24/11 0121  . DISCONTD: morphine 4 MG/ML injection 1-4 mg  1-4 mg Intravenous Q3H PRN Cristi Loron   4 mg at 01/22/11 2256  . DISCONTD: ondansetron (ZOFRAN) injection 4 mg  4 mg Intravenous Q6H PRN Skip Estimable      . DISCONTD: ondansetron (ZOFRAN) injection 4 mg  4  mg Intravenous Q4H PRN Cristi Loron      . DISCONTD: ondansetron (ZOFRAN) tablet 4 mg  4 mg Oral Q6H PRN Jacqulyn Ducking. Cammarata   4 mg at 01/13/11 1458  . DISCONTD: oxyCODONE (Oxy IR/ROXICODONE) immediate release tablet 5 mg  5 mg Oral Q4H PRN Jacqulyn Ducking. Cammarata   5 mg at 01/23/11 1055  . DISCONTD: oxyCODONE-acetaminophen (PERCOCET) 5-325 MG per tablet 1-2 tablet  1-2 tablet Oral Q4H PRN Cristi Loron      . DISCONTD: pantoprazole (PROTONIX) EC tablet 40 mg  40 mg Oral BID AC Orla Jolliff   40 mg at 01/23/11 1812  . DISCONTD: phenol (CHLORASEPTIC) mouth spray 1 spray  1 spray Mouth/Throat PRN Cristi Loron      . DISCONTD: polyethylene glycol (MIRALAX / GLYCOLAX) packet 17 g  17 g Oral BID Shanker Ghimire   17 g at 01/24/11 0120  . DISCONTD: polyvinyl alcohol (LIQUIFILM TEARS) 1.4 % ophthalmic solution 1 drop  1 drop Left  Eye Q4H PRN Michelle A. Matthews   1 drop at 01/16/11 1628  . DISCONTD: senna-docusate (Senokot-S) tablet 1 tablet  1 tablet Oral BID Maxson Oddo   1 tablet at 01/23/11 1400  . DISCONTD: simethicone (MYLICON) chewable tablet 80 mg  80 mg Oral QID Cristi Loron   80 mg at 01/23/11 1812  . DISCONTD: simvastatin (ZOCOR) tablet 20 mg  20 mg Oral q1800 Jacqulyn Ducking. Cammarata   20 mg at 01/23/11 1813  . DISCONTD: sodium chloride 0.9 % injection 3 mL  3 mL Intravenous Q12H Duane Lope Jenkins   3 mL at 01/23/11 1000  . DISCONTD: sodium chloride 0.9 % injection 3 mL  3 mL Intravenous PRN Cristi Loron      . DISCONTD: sodium chloride irrigation 0.9 %    PRN Cristi Loron   1,000 mL at 01/23/11 2037  . DISCONTD: Tamsulosin HCl (FLOMAX) capsule 0.4 mg  0.4 mg Oral BID Jacqulyn Ducking. Cammarata   0.4 mg at 01/23/11 1054  . DISCONTD: thrombin kit 5000 units    PRN Cristi Loron   5,000 Units at 01/23/11 2038  . DISCONTD: zolpidem (AMBIEN) tablet 5 mg  5 mg Oral QHS PRN Gery Pray, MD   5 mg at 01/21/11 2138  . DISCONTD: zolpidem (AMBIEN) tablet 5 mg  5 mg Oral QHS PRN  Cristi Loron   5 mg at 01/22/11 2314   Facility-Administered Medications Ordered in Other Encounters  Medication Dose Route Frequency Provider Last Rate Last Dose  . DISCONTD: ceFAZolin (ANCEF) IVPB 1 g/50 mL premix    PRN Roger A Foley   1 g at 01/23/11 2010  . DISCONTD: dexamethasone (DECADRON) injection    PRN Roger A Foley   8 mg at 01/23/11 2015  . DISCONTD: dextrose 5 % solution    Continuous PRN Roger A Foley      . DISCONTD: droperidol (INAPSINE) injection    PRN Roger A Foley   0.625 mg at 01/23/11 2015  . DISCONTD: fentaNYL (SUBLIMAZE) injection    PRN Roger A Foley   100 mcg at 01/23/11 2020  . DISCONTD: glycopyrrolate (ROBINUL) injection    PRN Tom Mueller   0.8 mg at 01/23/11 2115  . DISCONTD: lactated ringers infusion    Continuous PRN Roger A Foley      . DISCONTD: lidocaine (cardiac) 100 mg/35ml (XYLOCAINE) 20 MG/ML injection 2%    PRN Roger A Foley   100 mg at 01/23/11 2012  . DISCONTD: neostigmine (PROSTIGMINE) injection   Intravenous PRN Tom Mueller   4 mg at 01/23/11 2115  . DISCONTD: ondansetron (ZOFRAN) injection    PRN Roger A Foley   4 mg at 01/23/11 2012  . DISCONTD: phenylephrine (NEO-SYNEPHRINE) 0.08 mg/mL in sodium chloride 0.9 % 250 mL infusion  20 mg  Continuous PRN Tom Mueller   10 mcg/min at 01/23/11 2020  . DISCONTD: propofol (DIPRIVAN) 10 MG/ML infusion    PRN Roger A Foley   80 mg at 01/23/11 2015  . DISCONTD: rocuronium (ZEMURON) injection    PRN Roger A Foley   30 mg at 01/23/11 2015  . DISCONTD: succinylcholine (ANECTINE) injection    PRN Roger A Foley   100 mg at 01/23/11 2012   Allergies  Allergen Reactions  . Vicodin (Hydrocodone-Acetaminophen) Hives    On neck and chest.   Principal Problem:  *Cord compression Active Problems:  HTN (hypertension)  Dyslipidemia  BPH (benign prostatic hyperplasia)  GERD (gastroesophageal  reflux disease)  Lumbar stenosis with neurogenic claudication  Irritable bowel syndrome (IBS)  Gross hematuria   Vital  signs in last 24 hours: Temp:  [97.3 F (36.3 C)-98.2 F (36.8 C)] 97.5 F (36.4 C) (12/11 1100) Pulse Rate:  [63-87] 64  (12/11 1500) Resp:  [11-23] 11  (12/11 1500) BP: (114-150)/(61-105) 136/75 mmHg (12/11 1500) SpO2:  [98 %-100 %] 99 % (12/11 1500) Weight:  [77.8 kg (171 lb 8.3 oz)] 171 lb 8.3 oz (77.8 kg) (12/10 2356) Weight change:  Last BM Date: 01/18/11  Intake/Output from previous day: 12/10 0701 - 12/11 0700 In: 1350 [P.O.:360; I.V.:940; IV Piggyback:50] Out: 2455 [Urine:2355; Blood:100] Intake/Output this shift: Total I/O In: 1020 [P.O.:1020] Out: 850 [Urine:850]  Lab Results:  [facility] 01/24/11 0315 01/23/11 0745  WBC 17.4* 20.3*  HGB 13.3 13.9  HCT 37.9* 38.9*  PLT 170 195   BMET  Basename 01/24/11 0315 01/23/11 0745  NA 129* 128*  K 4.6 4.7  CL 90* 91*  CO2 29 29  GLUCOSE 242* 211*  BUN 28* 25*  CREATININE 0.73 0.73  CALCIUM 8.3* 8.7    Studies/Results: Dg Cervical Spine 1 View  01/23/2011  *RADIOLOGY REPORT*  Clinical Data: Hematoma.  DG SPINE PORTABLE - 1 VIEW  Comparison: 01/16/2011  Findings: Cross-table lateral view of the cervical spine obtained. C7 and T1 are not visualized due to soft tissue attenuation. Postoperative changes with anterior plate and screw fixation with intervertebral fusion at C6-7, incompletely visualized on this film.  This has been placed since the previous intraoperative study.  As visualized, the cervical vertebra appear to be in normal alignment.  Prominent degenerative changes throughout cervical spine with prominent bridging anterior osteophytes and probable ankylosis or  fusion at C4-5 and C5-6 levels. NG and endotracheal tubes are in place.  IMPRESSION: Postoperative changes at C6-7, incompletely visualized.  Diffuse degenerative changes.  Normal alignment of visualized vertebrae.  Original Report Authenticated By: Marlon Pel, M.D.   Mr Cervical Spine Wo Contrast  01/23/2011  *RADIOLOGY REPORT*  Clinical Data:  77 year old male with quadriparesis.  Neck pain.  MRI CERVICAL SPINE WITHOUT CONTRAST  Technique:  Multiplanar and multiecho pulse sequences of the cervical spine, to include the craniocervical junction and cervicothoracic junction, were obtained according to standard protocol without intravenous contrast.  Comparison: Intraoperative radiographs 01/16/2011.  Preoperative study 01/09/2011.  Findings: The patient is undergone interval C6-C7 ACDF.  However, cord compression at this level has not abated and appears somewhat worse as a result of a broad-based ventral epidural space occupying lesion which is nearly isointense to the spinal cord on T1 and STIR images, and dark on sagittal T2 (series 3 image 70). The ventral epidural collection tracks from the C5-C6 disc space to the C7-T1 disc space.  The thecal sac is virtually obliterated at this level (series 6 image 17) when combined with the preexisting severe ligament flavum hypertrophy at this level.  There is abnormal signal in the spinal cord, which to a degree appears similar to that on the preoperative study.  Prevertebral soft tissues are within normal limits.  Mild susceptibility artifact related to the C6-C7 ACDF hardware is noted.  No definite marrow edema.  Severe facet hypertrophy at C6- C7 with trace fluid in the facet joints is re-identified.  Above and below the C6-C7 level the cervical spinal canal is stable.  The visualized upper thoracic canal remains widely patent.  Bulky flowing ventral cervical osteophytes are re-identified. Cervicomedullary junction is within normal limits.  Stable  right thyroid nodule.  IMPRESSION: 1.  Interval C6-C7 ACDF with continued/progressed spinal cord compression at this level now in large part to a broad-based ventral epidural process, favor epidural hematoma. Critical Value/emergent results were called by telephone at the time of interpretation on 01/23/2011  at 1643 hours  to  Dr. Delma Officer in the neuro OR, who  verbally acknowledged these results. 2.  Stable cervical levels elsewhere.  Original Report Authenticated By: Harley Hallmark, M.D.    Medications: I have reviewed the patient's current medications.   Physical exam GENERAL- alert HEAD- normal atraumatic,  neck collar in place, no bleeding, normal thyroid, no jvd RESPIRATORY- appears well, vitals normal, no respiratory distress, acyanotic, normal RR, ear and throat exam is normal, neck free of mass or lymphadenopathy, chest clear, no wheezing, crepitations, rhonchi, normal symmetric air entry CVS- regular rate and rhythm, S1, S2 normal, no murmur, click, rub or gallop ABDOMEN- abdomen is soft without significant tenderness, masses, organomegaly or guarding NEURO- Grossly normal EXTREMITIES- extremities normal, atraumatic, no cyanosis or edema  Plan  1. Cord compression/ Lumbar stenosis with neurogenic claudication /epidural hematoma s/p evacuation. appreciate surgical input. Management per NS. Await CIR once ok with NS.  2. HTN (hypertension)- BP fluctuating, steroid element. Monitor on current meds.  3. GERD (gastroesophageal reflux disease)/ Irritable bowel syndrome (IBS)- c/o of dyspepsia still. Might have steroid induced gastritis, continue ppi bid dosing.  4. BPH (benign prostatic hyperplasia) /Gross hematuria- urine cleared. D/c flushes. Continue flomax.  5. Steroid induced hyperglycemia- remains uncontrolled. Increase lantus.  6. Leukocytosis- seems steroid induced, ?improving.      Breckan Cafiero 01/24/2011 4:15 PM Pager: [phone].

## 2011-01-24 NOTE — Progress Notes (Signed)
Physical Therapy Re-Evaluation Patient Details Name: Ricky Bradley MRN: [id number] DOB: 1933-07-05 Today's Date: 01/24/2011  PT Assessment/Plan  PT - Assessment/Plan Comments on Treatment Session: Re-evaluation complete s/p cervical hematoma evacuation.  Pt with improved participation/independence with mobility today.  However, still with decreased strength since initial evaluation on 01/19/11.  Goals still appropriate. PT Plan: Discharge plan remains appropriate;Frequency remains appropriate PT Frequency: Min 5X/week Follow Up Recommendations: Inpatient Rehab Equipment Recommended: Defer to next venue PT Goals  Acute Rehab PT Goals PT Goal Formulation: With patient Time For Goal Achievement: 2 weeks (Still appropriate after re-evaluation 01/24/11.) Pt will Roll Supine to Left Side: with min assist PT Goal: Rolling Supine to Left Side - Progress: Progressing toward goal Pt will go Supine/Side to Sit: with min assist PT Goal: Supine/Side to Sit - Progress: Progressing toward goal Pt will Sit at Edge of Bed: with supervision;1-2 min;with bilateral upper extremity support PT Goal: Sit at Edge Of Bed - Progress: Progressing toward goal Pt will go Sit to Supine/Side: with min assist PT Goal: Sit to Supine/Side - Progress: Progressing toward goal Pt will go Sit to Stand: with min assist PT Goal: Sit to Stand - Progress: Progressing toward goal Pt will go Stand to Sit: with min assist PT Goal: Stand to Sit - Progress: Progressing toward goal Pt will Ambulate: 16 - 50 feet;with min assist;with least restrictive assistive device PT Goal: Ambulate - Progress: Progressing toward goal  PT Treatment Precautions/Restrictions  Precautions Precautions: Fall;Other (comment) (Cervical precautions.) Precaution Comments: Cervical precautions Required Braces or Orthoses: Yes Cervical Brace: Hard collar;Applied in supine position Restrictions Weight Bearing Restrictions: No Pain 2/10 in abdomen.  RN  notified and pt repositioned. Lower Extremity Strength Right:   Hip Flexion-1/5   Hip Extension-2/5   Knee Flexion-2-/5   Knee Extension-2/5   Dorsiflexion-0/5   Plantarflexion-1/5 Left:   Hip Flexion-1/5   Hip Extension-2/5   Knee Flexion-2-/5   Knee Extension-2/5   Dorsiflexion-1/5   Plantarflexion-2/5 Mobility (including Balance) Bed Mobility Bed Mobility: Yes Rolling Right: 2: Max assist;With rail Rolling Right Details (indicate cue type and reason): Assist to flex bilateral LEs and rotate trunk/pelvis with cues for sequence. Rolling Left: 2: Max assist;With rail Rolling Left Details (indicate cue type and reason): Assist to flex bilateral LEs and facilitate rotation of trunk.  Cues for sequence. Right Sidelying to Sit: 2: Max assist;HOB flat Right Sidelying to Sit Details (indicate cue type and reason): Assist to facilitate bilateral LEs off of EOB and to trunk to achieve midline.  Cues for sequence. Left Sidelying to Sit: Not tested (comment) Sit to Supine - Right: 2: Max assist;HOB flat Sit to Supine - Right Details (indicate cue type and reason): Assist to trunk to guide slowly to bed and for bilateral LEs.  Cues for sequence. Transfers Transfers: Yes Sit to Stand: 1: +1 Total assist;From bed Sit to Stand Details (indicate cue type and reason): Assist to translate trunk anterior over BOS with cues for sequence and hand placement.  Pt able to attempt to extend bilateral LEs at knees, but with minimal success.  Blocked bilateral knees to prevent buckling.  Only able to clear bottom from bed a few inches. Stand to Sit: 1: +1 Total assist;To bed Stand to Sit Details: Assist to control eccentric descent from squat standing with blocking at bilateral knees.  Cues for sequence. Squat Pivot Transfers: Not tested (comment) Ambulation/Gait Ambulation/Gait: No Ambulation/Gait Assistance: Not tested (comment) Stairs: No Wheelchair Mobility Wheelchair Mobility: No  Posture/Postural Control Posture/Postural Control: Postural limitations Postural Limitations: Still with decreased trunk control causing thorax to flex and pelvis to tilt posterior. Balance Balance Assessed: Yes Static Sitting Balance Static Sitting - Balance Support: Bilateral upper extremity supported;Feet supported Static Sitting - Level of Assistance: 2: Max assist (Able to progress to min (guard) at EOB.) Static Sitting - Comment/# of Minutes: 15 End of Session PT - End of Session Equipment Utilized During Treatment: Gait belt Activity Tolerance: Patient limited by fatigue Patient left: in bed;with call bell in reach;with family/visitor present Nurse Communication: Need for lift equipment;Mobility status for transfers General Behavior During Session: [facility] for tasks performed Cognition: [facility] for tasks performed  Cephus Shelling 01/24/2011, 12:56 PM  01/24/2011 Cephus Shelling, PT, DPT [phone]

## 2011-01-25 DIAGNOSIS — M7989 Other specified soft tissue disorders: Secondary | ICD-10-CM

## 2011-01-25 DIAGNOSIS — M79609 Pain in unspecified limb: Secondary | ICD-10-CM

## 2011-01-25 LAB — COMPREHENSIVE METABOLIC PANEL
ALT: 18 U/L (ref 0–53)
Alkaline Phosphatase: 66 U/L (ref 39–117)
BUN: 29 mg/dL — ABNORMAL HIGH (ref 6–23)
CO2: 30 mEq/L (ref 19–32)
GFR calc Af Amer: >90 mL/min (ref >90)
GFR calc non Af Amer: >90 mL/min (ref >90)
Glucose, Bld: 214 mg/dL — ABNORMAL HIGH (ref 70–99)
Potassium: 4.7 mEq/L (ref 3.5–5.1)
Sodium: 130 mEq/L — ABNORMAL LOW (ref 135–145)
Total Bilirubin: 0.4 mg/dL (ref 0.3–1.2)
Total Protein: 5.3 g/dL — ABNORMAL LOW (ref 6.0–8.3)

## 2011-01-25 LAB — CBC
MCH: 28.5 pg (ref 26.0–34.0)
MCHC: 34.5 g/dL (ref 30.0–36.0)
MCV: 82.7 fL (ref 78.0–100.0)
WBC: 18.5 10*3/uL — ABNORMAL HIGH (ref 4.0–10.5)

## 2011-01-25 LAB — PROTIME-INR: Prothrombin Time: 13.7 seconds (ref 11.6–15.2)

## 2011-01-25 LAB — GLUCOSE, CAPILLARY: Glucose-Capillary: 274 mg/dL — ABNORMAL HIGH (ref 70–99)

## 2011-01-25 MED ORDER — FLEET ENEMA 7-19 GM/118ML RE ENEM
1.0000 | ENEMA | Freq: Every day | RECTAL | Status: DC | PRN
  Filled 2011-01-25: qty 1

## 2011-01-25 NOTE — Progress Notes (Signed)
This patient was discussed at long LOS rounds this morning. 

## 2011-01-25 NOTE — Progress Notes (Signed)
Physical Therapy Treatment Patient Details Name: Ricky Bradley MRN: [id number] DOB: 1933-08-05 Today's Date: 01/25/2011  PT Assessment/Plan  PT - Assessment/Plan PT Plan: Discharge plan remains appropriate PT Frequency: Min 5X/week Follow Up Recommendations: Inpatient Rehab Equipment Recommended: Defer to next venue PT Goals  Acute Rehab PT Goals PT Goal: Rolling Supine to Left Side - Progress: Not met PT Goal: Supine/Side to Sit - Progress: Not met PT Goal: Sit at Edge Of Bed - Progress: Not met PT Goal: Sit to Supine/Side - Progress: Not met PT Goal: Sit to Stand - Progress: Not met PT Goal: Stand to Sit - Progress: Not met  PT Treatment Precautions/Restrictions  Precautions Precautions: Fall Precaution Comments: cervical precautions.   Required Braces or Orthoses: Yes Cervical Brace: Hard collar;Applied in supine position Restrictions Weight Bearing Restrictions: No Mobility (including Balance) Bed Mobility Rolling Right: 1: +2 Total assist;With rail Rolling Right Details (indicate cue type and reason): pt=20%.  (A) to position LE's, initate & carry out rolling, sequencing.  Cues for sequencing/technqiue.   Rolling Left: 1: +2 Total assist;With rail Rolling Left Details (indicate cue type and reason): pt=20%;  (A) to bend/position bil LE's, rotate trunk/pelvis & cues for sequencing/technique.   Right Sidelying to Sit: 1: +2 Total assist;With rails Right Sidelying to Sit Details (indicate cue type and reason): pt= < 5%.  (A) for all components of transition.  Cues for sequencing, technique.   Sit to Supine - Right: 1: +2 Total assist Sit to Supine - Right Details (indicate cue type and reason): pt= < 5%; (A) to control shoulders/trunk to supine position & correctly position torso/shoulders.  (A) for bil LE's.   Transfers Sit to Stand: 1: +2 Total assist;From bed Sit to Stand Details (indicate cue type and reason): Attempted sit<>stand 2x's from bed; unsuccessful at achieving  standing.  Pt only able to clear hips off bed ~2-3 inches.  pt= <10%.    Posture/Postural Control Posture/Postural Control: Postural limitations Postural Limitations: flexed trunk & posterior pelvic tilt.  Worked on trunk control & scapular retraction activities to facilitate increased upright posture.   Static Sitting Balance Static Sitting - Balance Support: Bilateral upper extremity supported;Feet supported Static Sitting - Level of Assistance: 2: Max assist Static Sitting - Comment/# of Minutes: 10 minutes.  Max cues for upright posture, trunk control.   Exercise    End of Session PT - End of Session Equipment Utilized During Treatment: Gait belt Patient left: in bed Nurse Communication: Mobility status for transfers General Behavior During Session: [facility] for tasks performed Cognition: [facility] for tasks performed  Lara Mulch 01/25/2011, 3:31 PM [phone]

## 2011-01-25 NOTE — Progress Notes (Signed)
Patient ID: Ricky Bradley, male   DOB: [birth year], 77 y.o.   MRN: [id number] Subjective: Patient seen.Mild neck pain.Denies any systemic symptoms  Objective: Weight change:   Intake/Output Summary (Last 24 hours) at 01/25/11 1654 Last data filed at 01/25/11 1600  Gross per 24 hour  Intake   1920 ml  Output   2676 ml  Net   -756 ml   BP 119/74  Pulse 84  Temp(Src) 97.5 F (36.4 C) (Oral)  Resp 21  Ht 6' (1.829 m)  Wt 77.8 kg (171 lb 8.3 oz)  BMI 23.26 kg/m2  SpO2 98% Physical Exam: General appearance: alert, cooperative and no distress Head: Normocephalic, without obvious abnormality, atraumatic Neck: no adenopathy, no carotid bruit, no JVD, supple, symmetrical, trachea midline and thyroid not enlarged, symmetric, no tenderness/mass/nodules Lungs: clear to auscultation bilaterally Heart: regular rate and rhythm, S1, S2 normal, no murmur, click, rub or gallop Abdomen: soft, non-tender; bowel sounds normal; no masses,  no organomegaly Extremities: extremities normal, atraumatic, no cyanosis or edema Skin: Skin color, texture, turgor normal. No rashes or lesions Neuro-muscle power:RUE-3/5.LUE-3/5.SENSORY SENSATION-INTACT                                    RLE-2/5.  LLE-3/5  Lab Results: Results for orders placed during the hospital encounter of 01/08/11 (from the past 48 hour(s))  GLUCOSE, CAPILLARY     Status: Abnormal   Collection Time   01/23/11  5:07 PM      Component Value Range Comment   Glucose-Capillary 198 (*) 70 - 99 (mg/dL)   CBC     Status: Abnormal   Collection Time   01/24/11  3:15 AM      Component Value Range Comment   WBC 17.4 (*) 4.0 - 10.5 (K/uL)    RBC 4.61  4.22 - 5.81 (MIL/uL)    Hemoglobin 13.3  13.0 - 17.0 (g/dL)    HCT 62.1 (*) 30.8 - 52.0 (%)    MCV 82.2  78.0 - 100.0 (fL)    MCH 28.9  26.0 - 34.0 (pg)    MCHC 35.1  30.0 - 36.0 (g/dL)    RDW 65.7  84.6 - 96.2 (%)    Platelets 170  150 - 400 (K/uL)   BASIC METABOLIC PANEL     Status: Abnormal     Collection Time   01/24/11  3:15 AM      Component Value Range Comment   Sodium 129 (*) 135 - 145 (mEq/L)    Potassium 4.6  3.5 - 5.1 (mEq/L)    Chloride 90 (*) 96 - 112 (mEq/L)    CO2 29  19 - 32 (mEq/L)    Glucose, Bld 242 (*) 70 - 99 (mg/dL)    BUN 28 (*) 6 - 23 (mg/dL)    Creatinine, Ser 9.52  0.50 - 1.35 (mg/dL)    Calcium 8.3 (*) 8.4 - 10.5 (mg/dL)    GFR calc non Af Amer 89 (*) >90 (mL/min)    GFR calc Af Amer >90  >90 (mL/min)   PROTIME-INR     Status: Normal   Collection Time   01/24/11  3:15 AM      Component Value Range Comment   Prothrombin Time 13.9  11.6 - 15.2 (seconds)    INR 1.05  0.00 - 1.49    MRSA PCR SCREENING     Status: Normal   Collection Time  01/24/11  6:55 AM      Component Value Range Comment   MRSA by PCR NEGATIVE  NEGATIVE    GLUCOSE, CAPILLARY     Status: Abnormal   Collection Time   01/24/11  7:36 AM      Component Value Range Comment   Glucose-Capillary 188 (*) 70 - 99 (mg/dL)   GLUCOSE, CAPILLARY     Status: Abnormal   Collection Time   01/24/11 12:23 PM      Component Value Range Comment   Glucose-Capillary 185 (*) 70 - 99 (mg/dL)   GLUCOSE, CAPILLARY     Status: Abnormal   Collection Time   01/24/11  5:44 PM      Component Value Range Comment   Glucose-Capillary 181 (*) 70 - 99 (mg/dL)   GLUCOSE, CAPILLARY     Status: Abnormal   Collection Time   01/24/11  9:33 PM      Component Value Range Comment   Glucose-Capillary 208 (*) 70 - 99 (mg/dL)   CBC     Status: Abnormal   Collection Time   01/25/11  3:40 AM      Component Value Range Comment   WBC 18.5 (*) 4.0 - 10.5 (K/uL)    RBC 4.63  4.22 - 5.81 (MIL/uL)    Hemoglobin 13.2  13.0 - 17.0 (g/dL)    HCT 04.5 (*) 40.9 - 52.0 (%)    MCV 82.7  78.0 - 100.0 (fL)    MCH 28.5  26.0 - 34.0 (pg)    MCHC 34.5  30.0 - 36.0 (g/dL)    RDW 81.1  91.4 - 78.2 (%)    Platelets 155  150 - 400 (K/uL)   COMPREHENSIVE METABOLIC PANEL     Status: Abnormal   Collection Time   01/25/11  3:40 AM       Component Value Range Comment   Sodium 130 (*) 135 - 145 (mEq/L)    Potassium 4.7  3.5 - 5.1 (mEq/L)    Chloride 92 (*) 96 - 112 (mEq/L)    CO2 30  19 - 32 (mEq/L)    Glucose, Bld 214 (*) 70 - 99 (mg/dL)    BUN 29 (*) 6 - 23 (mg/dL)    Creatinine, Ser 9.56  0.50 - 1.35 (mg/dL)    Calcium 8.6  8.4 - 10.5 (mg/dL)    Total Protein 5.3 (*) 6.0 - 8.3 (g/dL)    Albumin 2.5 (*) 3.5 - 5.2 (g/dL)    AST 11  0 - 37 (U/L)    ALT 18  0 - 53 (U/L)    Alkaline Phosphatase 66  39 - 117 (U/L)    Total Bilirubin 0.4  0.3 - 1.2 (mg/dL)    GFR calc non Af Amer >90  >90 (mL/min)    GFR calc Af Amer >90  >90 (mL/min)   PROTIME-INR     Status: Normal   Collection Time   01/25/11  3:40 AM      Component Value Range Comment   Prothrombin Time 13.7  11.6 - 15.2 (seconds)    INR 1.03  0.00 - 1.49    GLUCOSE, CAPILLARY     Status: Abnormal   Collection Time   01/25/11  7:35 AM      Component Value Range Comment   Glucose-Capillary 207 (*) 70 - 99 (mg/dL)   GLUCOSE, CAPILLARY     Status: Abnormal   Collection Time   01/25/11  1:51 PM      Component  Value Range Comment   Glucose-Capillary 245 (*) 70 - 99 (mg/dL)     Micro Results: Recent Results (from the past 240 hour(s))  MRSA PCR SCREENING     Status: Normal   Collection Time   01/24/11  6:55 AM      Component Value Range Status Comment   MRSA by PCR NEGATIVE  NEGATIVE  Final     Studies/Results: Dg Chest 2 View  01/11/2011  *RADIOLOGY REPORT*  Clinical Data: Preoperative chest radiograph for cervical spine surgery.  CHEST - 2 VIEW  Comparison: Chest radiograph performed 01/08/2011  Findings: The lungs are well-aerated and clear.  There is no evidence of focal opacification, pleural effusion or pneumothorax. The right costophrenic angle is incompletely imaged on the frontal view.  The heart is normal in size; the mediastinal contour is within normal limits.  No acute osseous abnormalities are seen.  Mild anterior bridging osteophytes are  noted along the lower thoracic spine.  IMPRESSION: No acute cardiopulmonary process seen.  Original Report Authenticated By: Tonia Ghent, M.D.   Dg Chest 2 View  01/08/2011  *RADIOLOGY REPORT*  Clinical Data: Numbness in both legs since yesterday.  Weakness.  CHEST - 2 VIEW  Comparison: CT 11/02/2010.  Plain films 08/18/2010.  Findings: Lateral view degraded by patient arm position.  Mild thoracic spondylosis.  Apical lordotic frontal view. Midline trachea.  Normal heart size and mediastinal contours. No pleural effusion or pneumothorax.  Clear lungs.  IMPRESSION: Normal chest.  Original Report Authenticated By: Consuello Bossier, M.D.   Dg Cervical Spine 1 View  01/23/2011  *RADIOLOGY REPORT*  Clinical Data: Hematoma.  DG SPINE PORTABLE - 1 VIEW  Comparison: 01/16/2011  Findings: Cross-table lateral view of the cervical spine obtained. C7 and T1 are not visualized due to soft tissue attenuation. Postoperative changes with anterior plate and screw fixation with intervertebral fusion at C6-7, incompletely visualized on this film.  This has been placed since the previous intraoperative study.  As visualized, the cervical vertebra appear to be in normal alignment.  Prominent degenerative changes throughout cervical spine with prominent bridging anterior osteophytes and probable ankylosis or  fusion at C4-5 and C5-6 levels. NG and endotracheal tubes are in place.  IMPRESSION: Postoperative changes at C6-7, incompletely visualized.  Diffuse degenerative changes.  Normal alignment of visualized vertebrae.  Original Report Authenticated By: Marlon Pel, M.D.   Dg Cervical Spine 2-3 Views  01/16/2011  *RADIOLOGY REPORT*  Clinical Data: Anterior fusion at C6-7  CERVICAL SPINE - 2-3 VIEW  Comparison: MR c-spine of 01/09/2011  Findings: A cross-table lateral portable view shows an instrument for localization at the C6-7 level.  Considerable osteophyte formation is noted anteriorly from C2-C5 with apparent  fusion at C5- 6.  A second portable films shows placement of anterior fixation plate at the Z6-1 level with no change in alignment.  IMPRESSION: Anterior fusion at C6-7.  Normal alignment.  Diffuse degenerative change.  Original Report Authenticated By: Juline Patch, M.D.   Ct Head Wo Contrast  01/08/2011  *RADIOLOGY REPORT*  Clinical Data: Bilateral leg weakness  CT HEAD WITHOUT CONTRAST  Technique:  Contiguous axial images were obtained from the base of the skull through the vertex without contrast.  Comparison: 01/27/2007  Findings: Mild atrophy. There is no evidence of acute intracranial hemorrhage, brain edema, mass lesion, acute infarction,   mass effect, or midline shift. Acute infarct may be inapparent on noncontrast CT.  No other intra-axial abnormalities are seen, and the ventricles and  sulci are within normal limits in size and symmetry.   No abnormal extra-axial fluid collections or masses are identified.  No significant calvarial abnormality.  IMPRESSION: 1. Negative for bleed or other acute intracranial process.  Original Report Authenticated By: Osa Craver, M.D.   [name] Contrast  01/09/2011  *RADIOLOGY REPORT*  Clinical Data: Lower extremity weakness.  MRI HEAD WITHOUT AND WITH CONTRAST  Technique:  Multiplanar, multiecho pulse sequences of the brain and surrounding structures were obtained according to standard protocol without and with intravenous contrast  Contrast: 1 MULTIHANCE GADOBENATE DIMEGLUMINE 529 MG/ML IV SOLN  Comparison: CT 01/08/2011  Findings: Age appropriate atrophy.  Slight chronic microvascular ischemic change in the white matter.  Negative for acute infarct. Negative for hemorrhage or mass.  Postcontrast imaging the brain reveals normal enhancement.  Mild mucosal edema in the paranasal sinuses.  Joint effusion involving the right C1-C2 joint.  IMPRESSION: No acute intracranial abnormality.  Original Report Authenticated By: Camelia Phenes, M.D.   Mr  Cervical Spine Wo Contrast  01/23/2011  *RADIOLOGY REPORT*  Clinical Data: 77 year old male with quadriparesis.  Neck pain.  MRI CERVICAL SPINE WITHOUT CONTRAST  Technique:  Multiplanar and multiecho pulse sequences of the cervical spine, to include the craniocervical junction and cervicothoracic junction, were obtained according to standard protocol without intravenous contrast.  Comparison: Intraoperative radiographs 01/16/2011.  Preoperative study 01/09/2011.  Findings: The patient is undergone interval C6-C7 ACDF.  However, cord compression at this level has not abated and appears somewhat worse as a result of a broad-based ventral epidural space occupying lesion which is nearly isointense to the spinal cord on T1 and STIR images, and dark on sagittal T2 (series 3 image 70). The ventral epidural collection tracks from the C5-C6 disc space to the C7-T1 disc space.  The thecal sac is virtually obliterated at this level (series 6 image 17) when combined with the preexisting severe ligament flavum hypertrophy at this level.  There is abnormal signal in the spinal cord, which to a degree appears similar to that on the preoperative study.  Prevertebral soft tissues are within normal limits.  Mild susceptibility artifact related to the C6-C7 ACDF hardware is noted.  No definite marrow edema.  Severe facet hypertrophy at C6- C7 with trace fluid in the facet joints is re-identified.  Above and below the C6-C7 level the cervical spinal canal is stable.  The visualized upper thoracic canal remains widely patent.  Bulky flowing ventral cervical osteophytes are re-identified. Cervicomedullary junction is within normal limits.  Stable right thyroid nodule.  IMPRESSION: 1.  Interval C6-C7 ACDF with continued/progressed spinal cord compression at this level now in large part to a broad-based ventral epidural process, favor epidural hematoma. Critical Value/emergent results were called by telephone at the time of  interpretation on 01/23/2011  at 1643 hours  to  Dr. Delma Officer in the neuro OR, who verbally acknowledged these results. 2.  Stable cervical levels elsewhere.  Original Report Authenticated By: Harley Hallmark, M.D.   Mr Cervical Spine W Wo Contrast  01/24/2011  **ADDENDUM** CREATED: 01/09/2011 13:34:23  Critical Value/emergent results were called by telephone at the time of interpretation on 01/09/2011  at 1136 hours  to  Dr. Jerral Ralph, who verbally acknowledged these results.  **END ADDENDUM** SIGNED BY: Andreas Newport, M.D.   01/09/2011  **ADDENDUM** CREATED: 01/09/2011 13:34:23  Critical Value/emergent results were called by telephone at the time of interpretation on 01/09/2011  at 1136 hours  to  Dr. Jerral Ralph, who verbally acknowledged these results.  **END ADDENDUM** SIGNED BY: Andreas Newport, M.D.   01/09/2011  *RADIOLOGY REPORT*  Clinical Data:  Leg weakness.  Lower extremity weakness and inability to ambulate.  Spinal stenosis.  MRI CERVICAL SPINE WITHOUT AND WITH CONTRAST  Technique:  Multiplanar and multiecho pulse sequences of the cervic al spine, to include the craniocervical junction and cervicothoraci c junction, were obtained according to standard protocol without an d with intravenous contrast.  Contrast: 15 ml Multihance  Comparison: None.  Findings:  Exaggeration of the normal cervical lordosis.  There appears to be ankylosis extending from C4-C6, with confluent anterior osteophytes. The appearance is compatible with diffuse idiopathic skeletal hyperostosis. Cervical cord edema is present associated with cervical cord compression at C6-C7.  There is probable ankylosis across C2-C3 as well.  Small right greater than left atlanto-occipital effusions are present.  Craniocervical junction appears normal. Nonspecific cystic appearing lesion is present in the right thyroid lobe measuring 15 mm.  C2-C3:  Central canal patent.  Left foraminal encroachment associated with facet arthrosis.  C3-C4:   Mild central stenosis associated with broad-based disc osteophyte complex.  Mild right posterior ligamentum flavum redundancy.  Disc osteophyte complex contacts the ventral aspect of the cervical cord and may flattened it slightly.  Right greater than left foraminal stenosis associated with uncovertebral spurring and facet arthrosis.  C4-C5:  Apparent ankylosis across the disc space.  Central canal and foramina appear patent.  C5-C6:  Facet hypertrophy.  Apparent ankylosis.  Mild central stenosis associated with osseous ridging.  No definite cord deformity.  C6-C7:  Severe central stenosis with compression of the cervical cord. Mild edema is present above and below the levels of compression.  Broad-based disc osteophyte complex and posterior ligamentum flavum redundancy produce the severe central stenosis. Bilateral foraminal stenosis secondary uncovertebral spurring. Bilateral facet effusions.  C7-T1:  Severe right facet arthrosis with right foraminal stenosis potentially affecting the right C8 nerve.  The foramen appears patent. Minimal central stenosis associated with posterior element hypertrophy and ligamentum flavum redundancy.  IMPRESSION: 1.  Cervical cord compression at C6-C7 with cord edema and / or myelomalacia.  Severe spinal stenosis due to broad-based disc osteophyte complex and posterior ligamentum flavum redundancy. 2.  Diffuse idiopathic skeletal hyperostosis with ankylosis from C4- C6, probably extending to C2 and C3 as well. 3.  Mild C3-C4 central stenosis secondary to disc osteophyte complex.  Right greater than left foraminal stenosis associated uncovertebral spurring.  MRI THORACIC SPINE WITHOUT AND WITH CONTRAST  Technique:  Multiplanar and multiecho pulse sequences of the thoracic spine were obtained without and with intravenous contrast.  Contrast: 15 MULTIHANCE GADOBENATE DIMEGLUMINE 529 MG/ML IV SOLN  Findings:  Mild dextroconvex curvature of the thoracic spine is present.  Remote  compression fractures T7 and T8, with approximately 25% loss of vertebral body height.  No marrow edema. The thoracic cord demonstrates normal caliber and signal. Paraspinal soft tissues are within normal limits.  Scattered vertebral body hemangiomata are present.  High signal is present within the discs at T6-T7 and T7-T8 with post-gadolinium enhancement.  There is no endplate edema, erosion or enhancement and this is compatible with degenerative enhancement of the discs.  This may also be due to ossification of the discs with development of marrow space.  Thoracic spine DISH is present.  Tiny central disc protrusion is present at T7-T8, just contacting the ventral aspect of the thoracic cord.  Per CMS PQRS reporting requirements (PQRS Measure 24): Given the  patient's age of greater than 50 and the fracture site (hip, distal radius, or spine), the patient should be tested for osteoporosis using DXA, and the appropriate treatment considered based on the DXA results.  IMPRESSION: No thoracic cord compression.  Thoracic spine DISH and chronic compression fractures of T7 and T8 (25% loss of height) without retropulsion. Original Report Authenticated By: Andreas Newport, M.D.   Mr Thoracic Spine W Wo Contrast  01/24/2011  **ADDENDUM** CREATED: 01/09/2011 13:34:23  Critical Value/emergent results were called by telephone at the time of interpretation on 01/09/2011  at 1136 hours  to  Dr. Jerral Ralph, who verbally acknowledged these results.  **END ADDENDUM** SIGNED BY: Andreas Newport, M.D.   01/09/2011  **ADDENDUM** CREATED: 01/09/2011 13:34:23  Critical Value/emergent results were called by telephone at the time of interpretation on 01/09/2011  at 1136 hours  to  Dr. Jerral Ralph, who verbally acknowledged these results.  **END ADDENDUM** SIGNED BY: Andreas Newport, M.D.   01/09/2011  *RADIOLOGY REPORT*  Clinical Data:  Leg weakness.  Lower extremity weakness and inability to ambulate.  Spinal stenosis.  MRI CERVICAL  SPINE WITHOUT AND WITH CONTRAST  Technique:  Multiplanar and multiecho pulse sequences of the cervic al spine, to include the craniocervical junction and cervicothoraci c junction, were obtained according to standard protocol without an d with intravenous contrast.  Contrast: 15 ml Multihance  Comparison: None.  Findings:  Exaggeration of the normal cervical lordosis.  There appears to be ankylosis extending from C4-C6, with confluent anterior osteophytes. The appearance is compatible with diffuse idiopathic skeletal hyperostosis. Cervical cord edema is present associated with cervical cord compression at C6-C7.  There is probable ankylosis across C2-C3 as well.  Small right greater than left atlanto-occipital effusions are present.  Craniocervical junction appears normal. Nonspecific cystic appearing lesion is present in the right thyroid lobe measuring 15 mm.  C2-C3:  Central canal patent.  Left foraminal encroachment associated with facet arthrosis.  C3-C4:  Mild central stenosis associated with broad-based disc osteophyte complex.  Mild right posterior ligamentum flavum redundancy.  Disc osteophyte complex contacts the ventral aspect of the cervical cord and may flattened it slightly.  Right greater than left foraminal stenosis associated with uncovertebral spurring and facet arthrosis.  C4-C5:  Apparent ankylosis across the disc space.  Central canal and foramina appear patent.  C5-C6:  Facet hypertrophy.  Apparent ankylosis.  Mild central stenosis associated with osseous ridging.  No definite cord deformity.  C6-C7:  Severe central stenosis with compression of the cervical cord. Mild edema is present above and below the levels of compression.  Broad-based disc osteophyte complex and posterior ligamentum flavum redundancy produce the severe central stenosis. Bilateral foraminal stenosis secondary uncovertebral spurring. Bilateral facet effusions.  C7-T1:  Severe right facet arthrosis with right foraminal  stenosis potentially affecting the right C8 nerve.  The foramen appears patent. Minimal central stenosis associated with posterior element hypertrophy and ligamentum flavum redundancy.  IMPRESSION: 1.  Cervical cord compression at C6-C7 with cord edema and / or myelomalacia.  Severe spinal stenosis due to broad-based disc osteophyte complex and posterior ligamentum flavum redundancy. 2.  Diffuse idiopathic skeletal hyperostosis with ankylosis from C4- C6, probably extending to C2 and C3 as well. 3.  Mild C3-C4 central stenosis secondary to disc osteophyte complex.  Right greater than left foraminal stenosis associated uncovertebral spurring.  MRI THORACIC SPINE WITHOUT AND WITH CONTRAST  Technique:  Multiplanar and multiecho pulse sequences of the thoracic spine were obtained without and with intravenous contrast.  Contrast: 15 MULTIHANCE GADOBENATE DIMEGLUMINE 529 MG/ML IV SOLN  Findings:  Mild dextroconvex curvature of the thoracic spine is present.  Remote compression fractures T7 and T8, with approximately 25% loss of vertebral body height.  No marrow edema. The thoracic cord demonstrates normal caliber and signal. Paraspinal soft tissues are within normal limits.  Scattered vertebral body hemangiomata are present.  High signal is present within the discs at T6-T7 and T7-T8 with post-gadolinium enhancement.  There is no endplate edema, erosion or enhancement and this is compatible with degenerative enhancement of the discs.  This may also be due to ossification of the discs with development of marrow space.  Thoracic spine DISH is present.  Tiny central disc protrusion is present at T7-T8, just contacting the ventral aspect of the thoracic cord.  Per CMS PQRS reporting requirements (PQRS Measure 24): Given the patient's age of greater than 50 and the fracture site (hip, distal radius, or spine), the patient should be tested for osteoporosis using DXA, and the appropriate treatment considered based on the DXA  results.  IMPRESSION: No thoracic cord compression.  Thoracic spine DISH and chronic compression fractures of T7 and T8 (25% loss of height) without retropulsion. Original Report Authenticated By: Andreas Newport, M.D.   Mr Lumbar Spine W Wo Contrast  01/08/2011  *RADIOLOGY REPORT*  Clinical Data: Bilateral lower extremity weakness.  History of prior lumbar spine surgery.  Progressive worsening weakness in the lower extremities.  MRI LUMBAR SPINE WITHOUT AND WITH CONTRAST  Technique:  Multiplanar and multiecho pulse sequences of the lumbar spine were obtained without and with intravenous contrast.  Contrast:  17 ml Multihance.  Comparison: MRI lumbar spine 09/14/2010.  Findings: Numbering convention used on prior exam preserved. Vertebral body height and marrow signal is within normal limits. Paraspinal soft tissues are within normal limits.  Distention of the urinary bladder is present.  No destructive osseous lesions. Postoperative changes of L4 laminectomy.  The small fluid collection in the operative bed seen on the recent comparison has resolved.  T12-L1:  Negative.  L1-L2:  Shallow posterior disc bulging without stenosis.  Mild facet hypertrophy and ligamentum flavum redundancy.  L2-L3:  Mild facet hypertrophy.  No stenosis.  L3-L4:  Bilateral facet hypertrophy and ligamentum flavum redundancy with mild central stenosis.  Unchanged mild symmetric bilateral foraminal stenosis.  L4-L5:  Laminectomy with wide posterior decompression.  Central canal and lateral recesses are patent.  The enhancing granulation tissue is present in the operative bed and the epidural space.  No epidural abscess or fluid collection.  Short pedicles are present with mild left foraminal stenosis.  Facet hypertrophy is present.  L5-S1:  Disc desiccation and degeneration.  Degenerative endplate changes are present.  Central canal and lateral recesses are patent.  Mild left foraminal stenosis associated with endplate spurring and facet  hypertrophy.  The left greater than right facet degeneration/hypertrophy.  IMPRESSION:  1.  Evolving postoperative changes of L4 laminectomy with good decompression of the thecal sac and lateral recesses.  Mild left foraminal stenosis associated with short pedicles and facet hypertrophy potentially affecting the left L4 nerve. Resolution of small fluid collection in the operative bed.  Expected enhancing granulation tissue in the operative bed. 2.  Unchanged L5-S1 degenerative disease with mild left foraminal stenosis secondary endplate spurring and facet hypertrophy. 3.  Unchanged L3-L4 facet arthrosis with mild central stenosis.  Original Report Authenticated By: Andreas Newport, M.D.   Medications: Scheduled Meds:   . citalopram  10 mg Oral Daily  .  dexamethasone  8 mg Oral Q6H  . dicyclomine  10 mg Oral TID AC & HS  . docusate sodium  100 mg Oral BID  . insulin aspart  0-15 Units Subcutaneous TID WC  . insulin glargine  10 Units Subcutaneous Q0700  . loratadine  10 mg Oral Daily  . metoprolol succinate  12.5 mg Oral BID  . pantoprazole  40 mg Oral BID AC  . polyethylene glycol  17 g Oral BID  . simethicone  80 mg Oral QID  . simvastatin  20 mg Oral q1800  . Tamsulosin HCl  0.4 mg Oral BID   Continuous Infusions:   . lactated ringers     PRN Meds:.acetaminophen, acetaminophen, alum & mag hydroxide-simeth, bisacodyl, diazepam, gi cocktail, menthol-cetylpyridinium, morphine, ondansetron (ZOFRAN) IV, oxyCODONE, phenol, polyvinyl alcohol, promethazine, sodium phosphate, zolpidem  Assessment/Plan: #1 Cord compression-secondary to epidural hematoma s/p diskectomy with evacuation of clot-management as per neuro-surgery.wbc count elevated.will repeat hematological indices before introducing anti-biotics #2 HTN (hypertension)-Bp fairly stable #3 Dyslipidemia-continue zocor #4 BPH (benign prostatic hyperplasia)-will continue flomax #5 GERD (gastroesophageal reflux disease)-continue protonix #6  Lumbar stenosis with neurogenic claudication-physical therapy #7Irritable bowel syndrome (IBS)  #8 Gross hematuria?secondary to bph #9 Hyperglycemia-?steroid induced.Will continue insulin sliding scale    LOS: 17 days   Ane Conerly 01/25/2011, 4:54 PM

## 2011-01-25 NOTE — Progress Notes (Signed)
Occupational Therapy Re-Evaluation Patient Details Name: Ricky Bradley MRN: [id number] DOB: [birth year] Today's Date: 01/25/2011  Problem List:  Patient Active Problem List  Diagnoses  . HTN (hypertension)  . Dyslipidemia  . BPH (benign prostatic hyperplasia)  . GERD (gastroesophageal reflux disease)  . Lumbar stenosis with neurogenic claudication  . Cord compression  . Irritable bowel syndrome (IBS)  . Gross hematuria    Past Medical History:  Past Medical History  Diagnosis Date  . Hypertension   . GERD (gastroesophageal reflux disease)   . High cholesterol   . Enlarged prostate   . High cholesterol    Past Surgical History:  Past Surgical History  Procedure Date  . Joint replacement 2001  and 2002    both knees  . Laminectomy 08/2010  . Back surgery   . Replacement total knee   . Cholecystectomy 11/01/10  . Anterior cervical decomp/discectomy fusion 01/16/2011    Procedure: ANTERIOR CERVICAL DECOMPRESSION/DISCECTOMY FUSION 1 LEVEL;  Surgeon: Cristi Loron;  Location: [facility];  Service: Neurosurgery;  Laterality: N/A;  Cervical six-seven  Anterior Cervical Decomp[ression Fusion    OT Assessment/Plan/Recommendation OT Assessment Clinical Impression Statement: Patient originally admitted for C6-7 discectomy and fusion and now s/p cervical hematoma evacuation. Patient will benefit from skilled OT in the acute setting for the below listed problems to maximize independence with ADLs and ADL mobility to facilitate d/c to the next venue of care. OT Recommendation/Assessment: Patient will need skilled OT in the acute care venue OT Problem List: Decreased strength;Decreased activity tolerance;Decreased knowledge of use of DME or AE;Impaired balance (sitting and/or standing);Decreased knowledge of precautions;Decreased coordination;Impaired UE functional use;Pain OT Therapy Diagnosis : Generalized weakness;Acute pain OT Plan OT Frequency: Min 2X/week OT  Treatment/Interventions: Self-care/ADL training;Therapeutic exercise;DME and/or AE instruction;Patient/family education;Balance training;Therapeutic activities OT Recommendation Recommendations for Other Services: Rehab consult Follow Up Recommendations: Inpatient Rehab Equipment Recommended: Defer to next venue Individuals Consulted Consulted and Agree with Results and Recommendations: Patient;Family member/caregiver Family Member Consulted: son OT Goals Acute Rehab OT Goals OT Goal Formulation: With patient/family Time For Goal Achievement: 7 days ADL Goals Pt Will Perform Grooming: with set-up;Sitting, edge of bed;Unsupported;with supervision ADL Goal: Grooming - Progress: Progressing toward goals Pt Will Perform Upper Body Bathing: with min assist;with set-up;Sitting, edge of bed ADL Goal: Upper Body Bathing - Progress: Progressing toward goals Pt Will Perform Lower Body Bathing: with min assist;with set-up;Sit to stand from bed (Mod assist for sit -> stand) ADL Goal: Lower Body Bathing - Progress: Progressing toward goals Pt Will Perform Upper Body Dressing: with set-up;with supervision;Sitting, bed ADL Goal: Upper Body Dressing - Progress: Progressing toward goals Pt Will Perform Lower Body Dressing: with mod assist;Sit to stand from bed ADL Goal: Lower Body Dressing - Progress: Progressing toward goals Pt Will Transfer to Toilet: with mod assist;3-in-1;Stand pivot transfer ADL Goal: Toilet Transfer - Progress: Progressing toward goals Pt Will Perform Toileting - Clothing Manipulation: with min assist;Sitting on 3-in-1 or toilet;Standing ADL Goal: Toileting - Clothing Manipulation - Progress: Progressing toward goals Pt Will Perform Toileting - Hygiene: with min assist;Sit to stand from 3-in-1/toilet ADL Goal: Toileting - Hygiene - Progress: Progressing toward goals  OT Evaluation Precautions/Restrictions  Precautions Precautions: Fall (cervical) Precaution Comments: Cervical  precautions Required Braces or Orthoses: Yes Cervical Brace: Hard collar;Applied in supine position Restrictions Weight Bearing Restrictions: No Prior Functioning Home Living Lives With: Spouse Receives Help From: Family;Other (Comment) Type of Home: House Home Layout: One level Home Access: Stairs to enter Entrance Stairs-Rails:  Left Entrance Stairs-Number of Steps: 3 Bathroom Shower/Tub: Engineer, manufacturing systems: Standard Home Adaptive Equipment: Bedside commode/3-in-1;Walker - rolling;Tub transfer bench;Straight cane Prior Function Level of Independence: Independent with basic ADLs;Independent with homemaking with ambulation;Requires assistive device for independence;Independent with transfers Vocation: Retired ADL ADL Eating/Feeding: Performed;Minimal assistance Eating/Feeding Details (indicate cue type and reason): son assisted with set-up and preparation of food/containers Where Assessed - Eating/Feeding: Bed level Grooming: Performed;Wash/dry face;Set up;Moderate assistance Grooming Details (indicate cue type and reason): Required up to Max assist for sitting balance at EOB Where Assessed - Grooming: Sitting, bed Toileting - Hygiene: +1 Total assistance;Performed Toileting - Hygiene Details (indicate cue type and reason): +2total(pt=20%) to roll left and right  Where Assessed - Toileting Hygiene: Rolling right and/or left ADL Comments: Attempted sit to stand from EOB x2, able to clear bottom off bed but unable to stand. Pt +2 total (pt=less than or equal to 10%) for sit -> squat. Patient with BM x2; once during rolling and once after sitting EOB. Patient cleaned both times and pad changed. Vision/Perception  Vision - History Patient Visual Report: No change from baseline Cognition Cognition Orientation Level: Oriented X4;Oriented to place;Oriented to time;Oriented to situation Sensation/Coordination Sensation Light Touch: Impaired by gross  assessment Coordination Gross Motor Movements are Fluid and Coordinated: No Fine Motor Movements are Fluid and Coordinated: No Extremity Assessment RUE Assessment RUE Assessment: Exceptions to [facility] RUE Strength RUE Overall Strength Comments: 4/5 overall with grasp 4-5: wrist extensors stronger than flexors therefore pull wrist into extension when patient attempts to make a fist LUE Assessment LUE Assessment: Exceptions to [facility] LUE Strength LUE Overall Strength Comments: 4/5 overall with grasp 4-5: wrist extensors stronger than flexors therefore pull wrist into extension when patient attempts to make a fist Mobility  Bed Mobility Rolling Right: 1: +2 Total assist (pt=20%) Rolling Right Details (indicate cue type and reason): Assist to flex bilateral LEs and rotate trunk/pelvis with cues for sequence. Rolling Left: 1: +2 Total assist (pt=20%) Rolling Left Details (indicate cue type and reason): Assist to flex bilateral LEs and rotate trunk/pelvis with cues for sequence. Right Sidelying to Sit: 1: +2 Total assist (pt= < 5%) Right Sidelying to Sit Details (indicate cue type and reason): Assist to facilitate bilateral LEs off of EOB and to trunk to achieve midline. Cues for sequence. Sit to Supine - Right: 1: +2 Total assist (pt= <5%) Sit to Supine - Right Details (indicate cue type and reason): Assist to trunk to guide slowly to bed and for bilateral LEs. Cues for sequence. Transfers Sit to Stand:  (Unable to perform. ) Sit to Stand Details (indicate cue type and reason): +2 total assist (pt= < 10%) sit to clear bottom off bed x 2. Unable to achieve upright standing End of Session OT - End of Session Equipment Utilized During Treatment: Gait belt;Cervical collar Activity Tolerance: Patient limited by pain Patient left: in bed;with call bell in reach;with family/visitor present Nurse Communication: Mobility status for transfers General Behavior During Session: [facility] for tasks  performed Cognition: [facility] for tasks performed   Ricky Bradley 01/25/2011, 3:20 PM

## 2011-01-25 NOTE — Progress Notes (Signed)
Patient ID: Ricky Bradley, male   DOB: 10-08-1933, 77 y.o.   MRN: [id number] Subjective:  The patient is alert and pleasant. He looks well. He complains of constipation and requested an enema. I've spoken to the patient's son.  Objective: Vital signs in last 24 hours: Temp:  [97.2 F (36.2 C)-97.8 F (36.6 C)] 97.2 F (36.2 C) (12/12 0700) Pulse Rate:  [63-85] 67  (12/12 0900) Resp:  [10-19] 15  (12/12 0900) BP: (113-150)/(59-91) 132/74 mmHg (12/12 0900) SpO2:  [93 %-100 %] 97 % (12/12 0900)  Intake/Output from previous day: 12/11 0701 - 12/12 0700 In: 1860 [P.O.:1860] Out: 3651 [Urine:3650; Stool:1] Intake/Output this shift: Total I/O In: 360 [P.O.:360] Out: -   Physical exam the patient is alert and oriented. His motor strength has improved with 4-4+ over 5 strength in his bilateral hand grip, gastrocnemius, left dorsiflexors. 4/5 his right dorsiflexors.  The patient's dressing has an old blood staining. There is no evidence of hematoma or shift.  Lab Results:  [facility] 01/25/11 0340 01/24/11 0315  WBC 18.5* 17.4*  HGB 13.2 13.3  HCT 38.3* 37.9*  PLT 155 170   BMET  Basename 01/25/11 0340 01/24/11 0315  NA 130* 129*  K 4.7 4.6  CL 92* 90*  CO2 30 29  GLUCOSE 214* 242*  BUN 29* 28*  CREATININE 0.70 0.73  CALCIUM 8.6 8.3*    Studies/Results: Dg Cervical Spine 1 View  01/23/2011  *RADIOLOGY REPORT*  Clinical Data: Hematoma.  DG SPINE PORTABLE - 1 VIEW  Comparison: 01/16/2011  Findings: Cross-table lateral view of the cervical spine obtained. C7 and T1 are not visualized due to soft tissue attenuation. Postoperative changes with anterior plate and screw fixation with intervertebral fusion at C6-7, incompletely visualized on this film.  This has been placed since the previous intraoperative study.  As visualized, the cervical vertebra appear to be in normal alignment.  Prominent degenerative changes throughout cervical spine with prominent bridging anterior osteophytes  and probable ankylosis or  fusion at C4-5 and C5-6 levels. NG and endotracheal tubes are in place.  IMPRESSION: Postoperative changes at C6-7, incompletely visualized.  Diffuse degenerative changes.  Normal alignment of visualized vertebrae.  Original Report Authenticated By: Marlon Pel, M.D.   Mr Cervical Spine Wo Contrast  01/23/2011  *RADIOLOGY REPORT*  Clinical Data: 77 year old male with quadriparesis.  Neck pain.  MRI CERVICAL SPINE WITHOUT CONTRAST  Technique:  Multiplanar and multiecho pulse sequences of the cervical spine, to include the craniocervical junction and cervicothoracic junction, were obtained according to standard protocol without intravenous contrast.  Comparison: Intraoperative radiographs 01/16/2011.  Preoperative study 01/09/2011.  Findings: The patient is undergone interval C6-C7 ACDF.  However, cord compression at this level has not abated and appears somewhat worse as a result of a broad-based ventral epidural space occupying lesion which is nearly isointense to the spinal cord on T1 and STIR images, and dark on sagittal T2 (series 3 image 70). The ventral epidural collection tracks from the C5-C6 disc space to the C7-T1 disc space.  The thecal sac is virtually obliterated at this level (series 6 image 17) when combined with the preexisting severe ligament flavum hypertrophy at this level.  There is abnormal signal in the spinal cord, which to a degree appears similar to that on the preoperative study.  Prevertebral soft tissues are within normal limits.  Mild susceptibility artifact related to the C6-C7 ACDF hardware is noted.  No definite marrow edema.  Severe facet hypertrophy at C6- C7 with  trace fluid in the facet joints is re-identified.  Above and below the C6-C7 level the cervical spinal canal is stable.  The visualized upper thoracic canal remains widely patent.  Bulky flowing ventral cervical osteophytes are re-identified. Cervicomedullary junction is within normal  limits.  Stable right thyroid nodule.  IMPRESSION: 1.  Interval C6-C7 ACDF with continued/progressed spinal cord compression at this level now in large part to a broad-based ventral epidural process, favor epidural hematoma. Critical Value/emergent results were called by telephone at the time of interpretation on 01/23/2011  at 1643 hours  to  Dr. Delma Officer in the neuro OR, who verbally acknowledged these results. 2.  Stable cervical levels elsewhere.  Original Report Authenticated By: Harley Hallmark, M.D.    Assessment/Plan: Postop day #2: The patient's strength is improving. I'll plan to move him out of the ICU tomorrow. Hopefully we can get him in to rehabilitation by the end of the week.  Hyponatremia: I'll repeat his BMP in a few days  Constipation: As requested I will order an enema.  LOS: 17 days     Geeta Dworkin D 01/25/2011, 9:45 AM

## 2011-01-25 NOTE — Progress Notes (Signed)
*  PRELIMINARY RESULTS*  Lower Extremity Venous Duplex has been performed. Bilateral:  No evidence of DVT or Baker's Cyst.    Farrel Demark 01/25/2011, 12:22 PM

## 2011-01-26 ENCOUNTER — Encounter (HOSPITAL_COMMUNITY): Payer: Self-pay | Admitting: Neurosurgery

## 2011-01-26 LAB — COMPREHENSIVE METABOLIC PANEL
ALT: 19 U/L (ref 0–53)
Alkaline Phosphatase: 63 U/L (ref 39–117)
BUN: 30 mg/dL — ABNORMAL HIGH (ref 6–23)
CO2: 29 mEq/L (ref 19–32)
Calcium: 8.6 mg/dL (ref 8.4–10.5)
GFR calc Af Amer: >90 mL/min (ref >90)
GFR calc non Af Amer: 88 mL/min — ABNORMAL LOW (ref >90)
Glucose, Bld: 233 mg/dL — ABNORMAL HIGH (ref 70–99)
Sodium: 126 mEq/L — ABNORMAL LOW (ref 135–145)
Total Protein: 5.2 g/dL — ABNORMAL LOW (ref 6.0–8.3)

## 2011-01-26 LAB — DIFFERENTIAL
Eosinophils Absolute: 0 10*3/uL (ref 0.0–0.7)
Eosinophils Relative: 0 % (ref 0–5)
Lymphocytes Relative: 3 % — ABNORMAL LOW (ref 12–46)
Lymphs Abs: 0.5 10*3/uL — ABNORMAL LOW (ref 0.7–4.0)
Monocytes Relative: 3 % (ref 3–12)

## 2011-01-26 LAB — CBC
HCT: 38.6 % — ABNORMAL LOW (ref 39.0–52.0)
Hemoglobin: 13.7 g/dL (ref 13.0–17.0)
MCH: 29.2 pg (ref 26.0–34.0)
MCV: 82.3 fL (ref 78.0–100.0)
Platelets: 159 10*3/uL (ref 150–400)
RBC: 4.69 MIL/uL (ref 4.22–5.81)
WBC: 17.5 10*3/uL — ABNORMAL HIGH (ref 4.0–10.5)

## 2011-01-26 LAB — GLUCOSE, CAPILLARY
Glucose-Capillary: 266 mg/dL — ABNORMAL HIGH (ref 70–99)
Glucose-Capillary: 260 mg/dL — ABNORMAL HIGH (ref 70–99)

## 2011-01-26 MED ORDER — SODIUM CHLORIDE 0.9 % IV SOLN
500.0000 mg | Freq: Three times a day (TID) | INTRAVENOUS | Status: DC
  Administered 2011-01-26 – 2011-01-27 (×4): 500 mg via INTRAVENOUS
  Filled 2011-01-26 (×6): qty 500

## 2011-01-26 NOTE — Progress Notes (Signed)
Patient complained of bladder fullness at 0000 01/26/11. Catheter was removed at 2200 on 01/25/11. First bladder scan showed and in and out resulted in . Again at 0400 patient complained of bladder fullness and was bladder scanned. Ir showed 304 ml and 400 ml was obtained from in and out cath. Per protocol since the patient has been in and out cathed twice since catheter removal the next step would be to reinsert the catheter. From the recorded output he had a coude catheter most likely related to his history of BPH. Will continue to monitor and assess as well as pass on to oncoming nurse.  Darrick Penna Alaine 01/26/2011 5:54 AM

## 2011-01-26 NOTE — Progress Notes (Signed)
Patient complaining of fullness in bladder and being unable to void. Patient was bladder scanned showing . Patient in and out catheterized twice overnight. 33fr Floey placed per protocol. MD made aware.

## 2011-01-26 NOTE — PMR Pre-admission (Signed)
PMR Admission Coordinator Pre-Admission Assessment  Patient:  Ricky Bradley is an 77 y.o., male MRN:  [id number] DOB:  December 07, 1933 Height:  6' (182.9 cm) Weight:  76.6 kg (168 lb 14 oz)  Insurance Information: HMO:    PPO:     PCP:     IPA:     80/20:yes     OTHER:no HMO PRIMARY:Medicare a and b  Policy#:[id number]      Subscriber:pt Benefits:  Phone #:visionahare     Name:01/20/11 Eff. 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 Previous lumbar laminectomy for spinal stenosis 7/12. 12/3 patient underwent C6-7 acdf. Postoperatively with urinary retention with history of BPH. Urology consultd with foley placed and bladder irrigations due to hematuria. Placed on antibiotics for positive urine cultures.  On 01/23/11 when planning to admit pt to CIR, pt found to be quadriparetic with severe weakness in his hands. Cervical MRI confirmed hematoma. Patient taken to OR that evening for evacuation of cervical epidural hematoma. Postop continues with complaints of urinary retention and constipation. Catherizations performed and enemas ordered. Bilateral upper extremity weakness has improved.  Patients Past Medical History:   Past Medical History  Diagnosis Date  . Hypertension   . GERD (gastroesophageal reflux disease)   . High cholesterol   . Enlarged prostate   . High cholesterol    Family Medical History:  family history is not on file.  Patients Current Diet: Gluten  Restricted  Prior Rehab/Hospitalizations:  none  Medications Current Medications: Current facility-administered medications:acetaminophen (TYLENOL) suppository 650 mg, 650 mg, Rectal, Q6H PRN, Skip Estimable;  acetaminophen (TYLENOL) tablet 650 mg, 650 mg, Oral, Q6H PRN, Skip Estimable, 650 mg at 01/25/11 1344;  alum & mag hydroxide-simeth (MAALOX/MYLANTA) 200-200-20 MG/5ML suspension 30 mL, 30 mL, Oral, Q4H PRN, Simbiso Ranga;  bisacodyl (DULCOLAX) suppository 10 mg, 10 mg, Rectal, Daily PRN, Simbiso Ranga citalopram (CELEXA) tablet 10 mg, 10 mg, Oral, Daily, Mcarthur Rossetti. Angiulli, PA, 10 mg at 01/26/11 1141;  dexamethasone (DECADRON) tablet 1 mg, 1 mg, Oral, Q6H, Duane Lope Jenkins;  diazepam (VALIUM) tablet 5 mg, 5 mg, Oral, Q6H PRN, Cristi Loron, 5 mg at 01/26/11 1657;  dicyclomine (BENTYL) capsule 10 mg, 10 mg, Oral, TID AC & HS, Skip Estimable, 10 mg at 01/26/11 2200 docusate sodium (COLACE) capsule 100 mg, 100 mg, Oral, BID, Cristi Loron, 100 mg at 01/26/11 2200;  gi cocktail, 30 mL, Oral, BID PRN, Christina Rama, 30 mL at 01/14/11 1806;  imipenem-cilastatin (PRIMAXIN) 500 mg in sodium chloride 0.9 % 100 mL IVPB, 500 mg, Intravenous, Q8H, Talmage Nap, MD, 500 mg at 01/27/11 0655 insulin aspart (novoLOG) injection 0-15 Units, 0-15 Units, Subcutaneous, TID WC, Vassie Loll, MD, 8 Units at 01/26/11 1722;  insulin glargine (LANTUS) injection 10 Units, 10 Units, Subcutaneous, Q0700, Simbiso Ranga, 10 Units at 01/27/11 0657;  lactated ringers infusion, , Intravenous, Continuous, Cristi Loron;  loratadine (CLARITIN) tablet 10  mg, 10 mg, Oral, Daily, Michelle A. Ashley Royalty, 10 mg at 01/26/11 1141 menthol-cetylpyridinium (CEPACOL) lozenge 3 mg, 1 lozenge, Oral, PRN, Cristi Loron;  metoprolol succinate (TOPROL-XL) 24 hr tablet 12.5 mg, 12.5 mg, Oral, BID, Christina Rama, 12.5 mg at 01/26/11 2200;  morphine 4 MG/ML injection 1-4 mg, 1-4 mg, Intravenous, Q3H PRN, Cristi Loron, 2 mg at 01/26/11 2215;  ondansetron [facility]) injection 4 mg, 4 mg, Intravenous, Q4H PRN, Cristi Loron, 4 mg at 01/27/11 0419 oxyCODONE (Oxy IR/ROXICODONE) immediate release tablet 5 mg, 5 mg, Oral, Q4H PRN, Mcarthur Rossetti. Angiulli, PA, 5 mg at 01/25/11 2245;  pantoprazole (PROTONIX) EC tablet 40 mg, 40 mg, Oral, BID AC, Mcarthur Rossetti. Angiulli, PA, 40 mg at 01/26/11 1657;  phenol (CHLORASEPTIC) mouth spray 1 spray, 1 spray, Mouth/Throat, PRN, Cristi Loron;  polyethylene glycol (MIRALAX / GLYCOLAX) packet 17 g, 17 g, Oral, BID, Mcarthur Rossetti. Angiulli, PA, 17 g at 01/26/11 2202 polyvinyl alcohol (LIQUIFILM TEARS) 1.4 % ophthalmic solution 1 drop, 1 drop, Left Eye, Q4H PRN, Mcarthur Rossetti. Angiulli, PA;  promethazine (PHENERGAN) injection 6.25-12.5 mg, 6.25-12.5 mg, Intravenous, Q15 min PRN, Judie Petit, MD;  simethicone (MYLICON) chewable tablet 80 mg, 80 mg, Oral, QID, Mcarthur Rossetti. Angiulli, PA, 80 mg at 01/26/11 2204;  simvastatin (ZOCOR) tablet 20 mg, 20 mg, Oral, q1800, Mcarthur Rossetti. Angiulli, PA, 20 mg at 01/26/11 1702 sodium phosphate (FLEET) 7-19 GM/118ML enema 1 enema, 1 enema, Rectal, Daily PRN, Cristi Loron;  Tamsulosin HCl (FLOMAX) capsule 0.4 mg, 0.4 mg, Oral, BID, Mcarthur Rossetti. Angiulli, PA, 0.4 mg at 01/26/11 2200;  zolpidem (AMBIEN) tablet 5 mg, 5 mg, Oral, QHS PRN, Cristi Loron, 5 mg at 01/24/11 2120;  DISCONTD: dexamethasone (DECADRON) tablet 8 mg, 8 mg, Oral, Q6H, Mcarthur Rossetti. Angiulli, PA, 8 mg at 01/27/11 1610  Precautions/Special Needs:    Additional Precautions/Restrictions: Precautions Precautions: Fall Precaution Comments: cervical precautions Required Braces or Orthoses: Yes Cervical Brace: Hard collar;Applied in supine position Restrictions Weight Bearing Restrictions: No  Therapy Assessments  Home Living Lives With: Spouse Receives Help From: Family;Other (Comment) Type of Home: House Home Layout: One level Home Access: Stairs to enter Entrance Stairs-Rails:  Left Entrance Stairs-Number of Steps: 3 Bathroom Shower/Tub: Engineer, manufacturing systems: Standard Bathroom Accessibility: Yes Home Adaptive Equipment: Bedside commode/3-in-1;Walker - rolling;Tub transfer bench;Straight cane Prior Function Level of Independence: Independent with basic ADLs;Independent with homemaking with ambulation;Requires assistive device for independence;Independent with transfers Able to Take Stairs?: Yes Vocation: Retired   Educational psychologist Movements are Fluid and Coordinated: No Fine Motor Movements are Fluid and Coordinated: No Coordination and Movement Description: Some ataxia/apraxia in bilateral LEs.  Decreased propioception of trunk at EOB contributing to decreased balance.  SLP Recommendations:  none  Additional Prior Functional Levels:  Bed Mobility: was independent prior to a few days of admit. Laminectomy 7/12. progressiviel weaker  Prior Activity Level: Household: yes lately  ADLs/Mobility: ADL Eating/Feeding: Performed;Minimal assistance Eating/Feeding Details (indicate cue type and reason): son assisted with set-up and preparation of food/containers Where Assessed - Eating/Feeding: Bed level Grooming: Performed;Wash/dry face;Set up;Moderate assistance Grooming Details (indicate cue type and reason): Required up to Max assist for sitting balance at EOB Where Assessed - Grooming: Sitting, bed Upper Body Bathing: Simulated;Supervision/safety Where Assessed - Upper Body Bathing: Sitting, chair;Supported Lower Body Bathing: Simulated;Moderate assistance Where Assessed - Lower Body Bathing: Sit to stand from bed Upper Body Dressing: Simulated;Supervision/safety Where Assessed - Upper Body Dressing: Sitting, bed Lower Body Dressing: +2 Total assistance  Where Assessed - Lower Body Dressing: Sitting, bed Toilet Transfer: Simulated;Moderate assistance Toilet Transfer Method: Stand pivot Toilet Transfer Equipment:  (Bedside  chair) Toileting - Clothing Manipulation: Simulated;Moderate assistance Where Assessed - Toileting Clothing Manipulation:  (Sit to stand from bedside chair.) Toileting - Hygiene: +1 Total assistance;Performed Toileting - Hygiene Details (indicate cue type and reason): +2total(pt=20%) to roll left and right  Where Assessed - Toileting Hygiene: Rolling right and/or left Tub/Shower Transfer: Not assessed Tub/Shower Transfer Method: Not assessed Equipment Used: Rolling walker ADL Comments: Attempted sit to stand from EOB x2, able to clear bottom off bed but unable to stand. Pt +2 total (pt=less than or equal to 10%) for sit -> squat   Bed Mobility Bed Mobility: Yes Rolling Right: 1: +1 Total assist Rolling Right Details (indicate cue type and reason): (A) for LE's, initiate & carryout rolling, sequencing.  Cues for sequencing/technique.   Rolling Left: 1: +1 Total assist Rolling Left Details (indicate cue type and reason): (A) for LE's, rotate trunk/pelvis & cues for sequencing/technique.   Right Sidelying to Sit: 1: +2 Total assist;HOB flat;With rails (pt=<20%) Right Sidelying to Sit Details (indicate cue type and reason): Cues for sequencing, technique, UE positioning/use.  (A) to move LE's over EOB, lift shoulders/trunk to sitting, facilitate wt shifting onto left hip to evenly distribute wt through pelvis & provide support/balance for trunk.   Left Sidelying to Sit: Not tested (comment) Left Sidelying to Sit Details (indicate cue type and reason): Pt demonstrated little to no truck control. Sit to Supine - Right: 1: +2 Total assist;HOB flat (pt=<5%) Sit to Supine - Right Details (indicate cue type and reason): (A) for all components of transition.  Cues for sequencing/technique.   Transfers Transfers: Yes Sit to Stand: 1: +2 Total assist;From bed (unsuccessful.  ) Sit to Stand Details (indicate cue type and reason): attempted sit<>stand 3x's from bed; unsuccessful each time.  Decreased  ability to clear hips from bed compared to last PT session.   Stand to Sit: 1: +1 Total assist;To bed Stand to Sit Details: Assist to control eccentric descent from squat standing with blocking at bilateral knees.  Cues for sequence. Squat Pivot Transfers: Not tested (comment) Squat Pivot Transfer Details (indicate cue type and reason): max cues and total support provided to trunk and blocking of Bilateral LE.  Ambulation/Gait Ambulation/Gait: No Ambulation/Gait Assistance: Not tested (comment) Ambulation/Gait Assistance Details (indicate cue type and reason): Assist for balance with facilitation at sacrum to increase hip extension while blocking bilateral knees to prevent flexion.  Occasional hyperextension of left knee. Ambulation Distance (Feet): 2 Feet Assistive device: 2 person hand held assist Gait Pattern: Decreased step length - right;Decreased step length - left;Decreased dorsiflexion - right;Decreased weight shift to right;Decreased weight shift to left;Trunk flexed;Shuffle Stairs: No Wheelchair Mobility Wheelchair Mobility: No Posture/Postural Control Posture/Postural Control: Postural limitations Postural Limitations: difficulty maintaining upright posture.  Sits with flexed posture & rounded shoulders with neck flexed.   Balance Balance Assessed: Yes Static Sitting Balance Static Sitting - Balance Support: Bilateral upper extremity supported Static Sitting - Level of Assistance: 3: Mod assist (Min Guard (A)) Static Sitting - Comment/# of Minutes: Able to sit with Min Guard x 20 sec's x 2 trials.  Sat EOB x 10 minutes to focus on trunk control/stability, core activation to facilitate upright posture, scapular retraction activities.    Home Assistive Devices/Equipment:  Home Assistive Devices/Equipment Home Assistive Devices/Equipment: Dan Humphreys (specify type);Eyeglasses  Discharge Planning:  Living Arrangements: Spouse/significant other Support Systems: Spouse/significant  other Assistance Needed: none Do you have any problems obtaining your medications?: No Type of Residence: Private residence Home Care Services: No Patient expects to be discharged to:: home Case Management Consult Needed: No  Previous Home Environment:  Living Arrangements: Spouse/significant other Support Systems: Spouse/significant other Assistance Needed: none Do you have any problems obtaining your medications?: No Type of Residence: Private residence Home Care Services: No Patient expects to be discharged to:: home Home Environment Number of Levels:  (one level) Previous Home Environment Number of Steps: 3 steps Previous Home Environment Is Bedroom on Main Floor?: Yes Previous Home Environment Is Bathroom on Main Floor?: Yes  Discharge Living Setting:  Plans for Discharge Living Setting: Patient's home Discharge Living Setting Number of Levels: one level Discharge Living Setting Number of Steps: 3 steps Discharge Living Setting is Bedroom on Main Floor?: Yes  Social/Family/Support Systems:  Patient Roles: Spouse;Parent Contact Information: Scarlette Calico Anticipated Caregiver:  Marcantonio Anticipated Industrial/product designer Information: [phone] Ability/Limitations of Caregiver: supervision only.Could not lift Caregiver Availability: 24/7 Discharge Plan Discussed with Primary Caregiver: Yes Is Caregiver In Agreement with Plan?: Yes Does Caregiver/Family have Issues with Lodging/Transportation while Pt is in Rehab?: No  Goals/Additional Needs:  Patient/Family Goal for Rehab: supervision to Mod I P.T., Mod I to set up with O.T. Pt/Family Agrees to Admission and willing to participate: Yes Program Orientation Provided & Reviewed with Pt/Caregiver Including Roles  & Responsibilities: Yes Additional Information Needs: I met with son and daughter. If he does not progress as expected, have discussed further SNF rehab  Preadmission Screen Completed By:  Clois Dupes, 01/27/2011  8:32 AM  Patient's condition:  Please see physician update to information in consult dated 01/17/2011.  Preadmission Screen Competed by:Ottie Glazier, RN, Time/Date, [phone] 01/27/2011.  Discussed status with Dr. Riley Kill on 01/27/11 at 0830 and received telephone approval for admission today.  Admission Coordinator:  Clois Dupes, time 2952 Date 01/27/11.

## 2011-01-26 NOTE — Progress Notes (Signed)
Physical Therapy Treatment Patient Details Name: Ricky Bradley MRN: [id number] DOB: Apr 28, 1933 Today's Date: 01/26/2011  PT Assessment/Plan  PT - Assessment/Plan Comments on Treatment Session: Pt very motivated & willing to participate in therapy.  Continues to have significant strength & sensory defecits as evident in inability to feel pinching to bil LE's throughout entire legs.   PT Plan: Discharge plan remains appropriate PT Frequency: Min 5X/week Follow Up Recommendations: Inpatient Rehab Equipment Recommended: Defer to next venue PT Goals  Acute Rehab PT Goals PT Goal: Rolling Supine to Left Side - Progress: Not met PT Goal: Supine/Side to Sit - Progress: Not met PT Goal: Sit at Edge Of Bed - Progress: Progressing toward goal PT Goal: Sit to Supine/Side - Progress: Not met PT Goal: Sit to Stand - Progress: Not Progressing  PT Treatment Precautions/Restrictions  Precautions Precautions: Fall Precaution Comments: cervical precautions Required Braces or Orthoses: Yes Cervical Brace: Hard collar;Applied in supine position Restrictions Weight Bearing Restrictions: No Mobility (including Balance) Bed Mobility Rolling Right: 1: +1 Total assist Rolling Right Details (indicate cue type and reason): (A) for LE's, initiate & carryout rolling, sequencing.  Cues for sequencing/technique.   Rolling Left: 1: +1 Total assist Rolling Left Details (indicate cue type and reason): (A) for LE's, rotate trunk/pelvis & cues for sequencing/technique.   Right Sidelying to Sit: 1: +2 Total assist;HOB flat;With rails (pt=<20%) Right Sidelying to Sit Details (indicate cue type and reason): Cues for sequencing, technique, UE positioning/use.  (A) to move LE's over EOB, lift shoulders/trunk to sitting, facilitate wt shifting onto left hip to evenly distribute wt through pelvis & provide support/balance for trunk.   Sit to Supine - Right: 1: +2 Total assist;HOB flat (pt=<5%) Sit to Supine - Right Details  (indicate cue type and reason): (A) for all components of transition.  Cues for sequencing/technique.   Transfers Sit to Stand: 1: +2 Total assist;From bed (unsuccessful.  ) Sit to Stand Details (indicate cue type and reason): attempted sit<>stand 3x's from bed; unsuccessful each time.  Decreased ability to clear hips from bed compared to last PT session.   Ambulation/Gait Ambulation/Gait: No Stairs: No Wheelchair Mobility Wheelchair Mobility: No  Posture/Postural Control Postural Limitations: difficulty maintaining upright posture.  Sits with flexed posture & rounded shoulders with neck flexed.   Static Sitting Balance Static Sitting - Balance Support: Bilateral upper extremity supported Static Sitting - Level of Assistance: 3: Mod assist (Min Guard (A)) Static Sitting - Comment/# of Minutes: Able to sit with Min Guard x 20 sec's x 2 trials.  Sat EOB x 10 minutes to focus on trunk control/stability, core activation to facilitate upright posture, scapular retraction activities.   Exercise  General Exercises - Lower Extremity Ankle Circles/Pumps: PROM;Both;10 reps;Supine Heel Slides: PROM;Both;5 reps;Supine End of Session PT - End of Session Equipment Utilized During Treatment: Gait belt Activity Tolerance: Patient limited by fatigue Patient left: in bed;with call bell in reach;with family/visitor present Nurse Communication: Mobility status for transfers General Behavior During Session: [facility] for tasks performed Cognition: [facility] for tasks performed  Lara Mulch 01/26/2011, 3:15 PM

## 2011-01-26 NOTE — Progress Notes (Signed)
Patient ID: Ricky Bradley, male   DOB: [birth year], 77 y.o.   MRN: [id number] Subjective:  The patient is alert and pleasant. He feels he is making progress.  Objective: Vital signs in last 24 hours: Temp:  [97.4 F (36.3 C)-97.5 F (36.4 C)] 97.4 F (36.3 C) (12/13 1122) Pulse Rate:  [68-89] 78  (12/13 1300) Resp:  [10-32] 32  (12/13 1300) BP: (114-145)/(52-99) 129/82 mmHg (12/13 1300) SpO2:  [92 %-98 %] 97 % (12/13 1300) Weight:  [76.6 kg (168 lb 14 oz)] 168 lb 14 oz (76.6 kg) (12/13 0600)  Intake/Output from previous day: 12/12 0701 - 12/13 0700 In: 1920 [P.O.:1920] Out: 2730 [Urine:2730] Intake/Output this shift: Total I/O In: 480 [P.O.:480] Out: 675 [Urine:675]  Physical exam the patient is alert and oriented. His strength is 4/5 his bilateral hand grip, he is moving his lower extremities well.  Lab Results:  Basename 01/26/11 0400 01/25/11 0340  WBC 17.5* 18.5*  HGB 13.7 13.2  HCT 38.6* 38.3*  PLT 159 155   BMET  Basename 01/26/11 0400 01/25/11 0340  NA 126* 130*  K 4.8 4.7  CL 89* 92*  CO2 29 30  GLUCOSE 233* 214*  BUN 30* 29*  CREATININE 0.75 0.70  CALCIUM 8.6 8.6    Studies/Results: No results found.  Assessment/Plan: Postop day #4: I'll plan to move the patient to 3000 .  LOS: 18 days     Halaina Vanduzer D 01/26/2011, 2:58 PM

## 2011-01-26 NOTE — Progress Notes (Signed)
Patient is progressing well postop functionally with therapy. I would like to admit patient to inpatient acute rehabilitation tomorrow if cleaerd by surgeon and attending. Patient is in agreement and son at bedside also in agreement. I will follow up in the morning. Please call me with any questions. Pager [phone].

## 2011-01-26 NOTE — H&P (Signed)
Physical Medicine and Rehabilitation Admission H&P  Ricky Bradley is an 77 y.o. male.   Chief Complaint  Patient presents with  . Weakness  : HPI: 77 year old right-handed white male made November 25 of the bilateral lower extremity weakness that progressively worsened. Patient was similar episode in May of 2012 when he was seen by neurosurgery and underwent lumbar laminectomy for spinal stenosis. MRI of cervical spine showed severe stenosis of cervical C6 and cervical C7 with myelopathy. Patient underwent cervical C6-7 anterior cervical discectomy with decompression on December 3 by Dr. Tressie Stalker of neurosurgery. He was fitted with a cervical collar was to be worn at all times. Decadron protocol was initiated. Postoperative urinary retention and hematuria with noted history of benign prostatic hypertrophy. He was seen in consultation by urology services Dr. Retta Diones with Foley catheter tube inserted and placed on Flomax. He had been placed on Keflex December 7 for empiric coverage of suspect urinary tract infection with latest urine study showing 85,000 staph coagulase negative. Noted on December 10 patient to be quadriparetic with severe weakness his hands. Patient had been scheduled to go to inpatient rehabilitation services but due to increasing weakness neurosurgery was contacted and patient underwent MRI cervical spine showing cervical epidural hematoma. Underwent evacuation of cervical epidural hematoma on December 10 by Dr. Tressie Stalker. He remains on Decadron therapy. Steroid-induced hyperglycemia and placed on low dose Lantus insulin. Hospital course Foley catheter tube removed for a voiding trial with noted elevated post void residuals of 800 mL and inability to void thus his Foley catheter tube was reinserted until more mobile than attempt another voiding trial. He currently requires total assist of 2 to roll to the right and +2 total assist for sit to stand. Relation has not yet been  tested. Physical medicine rehabilitation was again consulted and physical therapy felt patient would be an excellent candidate for inpatient rehabilitation services. Review of Systems  Constitutional: Negative for fever and chills.  Eyes: Negative for double vision.  Respiratory: Negative for cough and shortness of breath.   Cardiovascular: Negative for chest pain.  Gastrointestinal: Positive for constipation. Negative for nausea.  Genitourinary: Positive for hematuria.       Urinary retention  Skin: Negative.   Neurological: Negative for dizziness, seizures and headaches.  Psychiatric/Behavioral: Positive for depression.   Past Medical History  Diagnosis Date  . Hypertension   . GERD (gastroesophageal reflux disease)   . High cholesterol   . Enlarged prostate   . High cholesterol    Past Surgical History  Procedure Date  . Joint replacement 2001  and 2002    both knees  . Laminectomy 08/2010  . Back surgery   . Replacement total knee   . Cholecystectomy 11/01/10  . Anterior cervical decomp/discectomy fusion 01/16/2011    Procedure: ANTERIOR CERVICAL DECOMPRESSION/DISCECTOMY FUSION 1 LEVEL;  Surgeon: Cristi Loron;  Location: [facility];  Service: Neurosurgery;  Laterality: N/A;  Cervical six-seven  Anterior Cervical Decomp[ression Fusion   History reviewed. No pertinent family history. Social History:  reports that he has quit smoking. He does not have any smokeless tobacco history on file. He reports that he does not drink alcohol or use illicit drugs. Allergies:  Allergies  Allergen Reactions  . Vicodin (Hydrocodone-Acetaminophen) Hives    On neck and chest.   Medications Prior to Admission  Medication Dose Route Frequency Provider Last Rate Last Dose  . acetaminophen (TYLENOL) tablet 650 mg  650 mg Oral Q6H PRN Skip Estimable  650 mg at 01/25/11 1344   Or  . acetaminophen (TYLENOL) suppository 650 mg  650 mg Rectal Q6H PRN Skip Estimable      . alum &  mag hydroxide-simeth (MAALOX/MYLANTA) 200-200-20 MG/5ML suspension 30 mL  30 mL Oral Q4H PRN Simbiso Ranga      . bacitracin 16109 UNITS injection           . bacitracin 60454 UNITS injection           . bisacodyl (DULCOLAX) suppository 10 mg  10 mg Rectal Daily PRN Simbiso Ranga      . ceFAZolin (ANCEF) 1-5 GM-% IVPB           . ceFAZolin (ANCEF) IVPB 1 g/50 mL premix  1 g Intravenous 60 min Pre-Op Duane Lope Jenkins   1 g at 01/16/11 0981  . ceFAZolin (ANCEF) IVPB 1 g/50 mL premix  1 g Intravenous Q8H Duane Lope Jenkins   1 g at 01/16/11 2250  . ceFAZolin (ANCEF) IVPB 1 g/50 mL premix  1 g Intravenous Q8H Duane Lope Jenkins   1 g at 01/24/11 0631  . citalopram (CELEXA) tablet 10 mg  10 mg Oral Daily Mcarthur Rossetti. Angiulli, PA   10 mg at 01/26/11 1141  . dexamethasone (DECADRON) tablet 8 mg  8 mg Oral Q6H Daniel J. Angiulli, PA   8 mg at 01/26/11 1142  . diazepam (VALIUM) tablet 5 mg  5 mg Oral Q6H PRN Cristi Loron      . dicyclomine (BENTYL) capsule 10 mg  10 mg Oral TID AC & HS Jacqulyn Ducking. Cammarata   10 mg at 01/26/11 1141  . docusate sodium (COLACE) capsule 100 mg  100 mg Oral BID Cristi Loron   100 mg at 01/25/11 2245  . enoxaparin (LOVENOX) injection 40 mg  40 mg Subcutaneous Q24H Cristi Loron   40 mg at 01/14/11 2202  . gadobenate dimeglumine (MULTIHANCE) injection 15 mL  15 mL Intravenous Once Medication Radiologist   15 mL at 01/09/11 1156  . gadobenate dimeglumine (MULTIHANCE) injection 17 mL  17 mL Intravenous Once PRN Medication Radiologist   17 mL at 01/08/11 1722  . gi cocktail  30 mL Oral BID PRN Christina Rama   30 mL at 01/14/11 1806  . insulin aspart (novoLOG) injection 0-15 Units  0-15 Units Subcutaneous TID WC Vassie Loll, MD   8 Units at 01/26/11 1322  . insulin glargine (LANTUS) injection 10 Units  10 Units Subcutaneous Q0700 Simbiso Ranga   10 Units at 01/26/11 0859  . lactated ringers infusion   Intravenous Continuous Cristi Loron      . lidocaine  (XYLOCAINE) 2 % jelly   Urethral Once [employer]      . loratadine (CLARITIN) tablet 10 mg  10 mg Oral Once Rolan Lipa   10 mg at 01/14/11 2327  . loratadine (CLARITIN) tablet 10 mg  10 mg Oral Daily Michelle A. Matthews   10 mg at 01/26/11 1141  . LORazepam (ATIVAN) 2 MG/ML injection           . LORazepam (ATIVAN) injection 0.5 mg  0.5 mg Intravenous Once Simbiso Ranga   0.5 mg at 01/23/11 1543  . LORazepam (ATIVAN) injection 1 mg  1 mg Intravenous Once Jacqulyn Ducking. Cammarata   1 mg at 01/23/11 1506  . LORazepam (ATIVAN) injection 1 mg  1 mg Intravenous Once Shanker Ghimire   1 mg at 01/09/11 0918  . LORazepam (ATIVAN)  injection 1 mg  1 mg Intravenous Once Shanker Ghimire   1 mg at 01/09/11 0948  . LORazepam (ATIVAN) injection 2 mg  2 mg Intravenous Once Thomasene Lot, PA   1 mg at 01/08/11 1427  . menthol-cetylpyridinium (CEPACOL) lozenge 3 mg  1 lozenge Oral PRN Cristi Loron       Or  . phenol (CHLORASEPTIC) mouth spray 1 spray  1 spray Mouth/Throat PRN Cristi Loron      . metoprolol succinate (TOPROL-XL) 24 hr tablet 12.5 mg  12.5 mg Oral BID Christina Rama   12.5 mg at 01/26/11 1142  . midazolam (VERSED) injection 0.5-2 mg  0.5-2 mg Intravenous Once PRN Judie Petit, MD      . morphine 4 MG/ML injection 1-4 mg  1-4 mg Intravenous Q3H PRN Cristi Loron   2 mg at 01/26/11 0155  . ondansetron (ZOFRAN) injection 4 mg  4 mg Intravenous Once PRN Aubery Lapping, MD      . ondansetron [facility]) injection 4 mg  4 mg Intravenous Q4H PRN Cristi Loron      . oxyCODONE (Oxy IR/ROXICODONE) immediate release tablet 5 mg  5 mg Oral Q4H PRN Mcarthur Rossetti. Angiulli, PA   5 mg at 01/25/11 2245  . pantoprazole (PROTONIX) EC tablet 40 mg  40 mg Oral BID AC Daniel J. Angiulli, PA   40 mg at 01/26/11 0900  . polyethylene glycol (MIRALAX / GLYCOLAX) packet 17 g  17 g Oral BID Mcarthur Rossetti. Angiulli, PA   17 g at 01/25/11 2244  . polyvinyl alcohol (LIQUIFILM TEARS) 1.4 %  ophthalmic solution 1 drop  1 drop Left Eye Q4H PRN Mcarthur Rossetti. Angiulli, PA      . promethazine (PHENERGAN) injection 6.25-12.5 mg  6.25-12.5 mg Intravenous Q15 min PRN Judie Petit, MD      . simethicone [facility]) chewable tablet 80 mg  80 mg Oral QID Mcarthur Rossetti. Angiulli, PA   80 mg at 01/26/11 1142  . simvastatin (ZOCOR) tablet 20 mg  20 mg Oral q1800 Mcarthur Rossetti. Angiulli, PA   20 mg at 01/25/11 2002  . sodium chloride 0.9 % bolus 1,000 mL  1,000 mL Intravenous Once Thomasene Lot, PA   1,000 mL at 01/08/11 1156  . sodium chloride 0.9 % infusion           . sodium chloride 0.9 % infusion           . sodium phosphate (FLEET) 7-19 GM/118ML enema 1 enema  1 enema Rectal Once Michelle A. Matthews   1 enema at 01/14/11 1806  . sodium phosphate (FLEET) 7-19 GM/118ML enema 1 enema  1 enema Rectal Daily PRN Cristi Loron      . Tamsulosin HCl (FLOMAX) capsule 0.4 mg  0.4 mg Oral BID Mcarthur Rossetti. Angiulli, PA   0.4 mg at 01/26/11 1141  . zolpidem (AMBIEN) tablet 5 mg  5 mg Oral QHS PRN Cristi Loron   5 mg at 01/24/11 2120  . DISCONTD: 0.9 %  sodium chloride infusion  250 mL Intravenous Continuous Cristi Loron 1 mL/hr at 01/22/11 1427 500 mL at 01/22/11 1427  . DISCONTD: 50,000 units bacitracin in 0.9% normal saline 250 mL irrigation    PRN Cristi Loron      . DISCONTD: acetaminophen (TYLENOL) suppository 650 mg  650 mg Rectal Q4H PRN Cristi Loron      . DISCONTD: acetaminophen (TYLENOL) suppository 650 mg  650 mg Rectal Q4H  PRN Cristi Loron      . DISCONTD: acetaminophen (TYLENOL) tablet 650 mg  650 mg Oral Q4H PRN Cristi Loron      . DISCONTD: aspirin EC tablet 81 mg  81 mg Oral Daily Skip Estimable      . DISCONTD: aspirin EC tablet 81 mg  81 mg Oral Daily Eugene Garnet, PHARMD   81 mg at 01/24/11 1610  . DISCONTD: aspirin EC tablet 81 mg  81 mg Oral Daily Mcarthur Rossetti. Angiulli, PA      . DISCONTD: bacitracin ointment    PRN Cristi Loron   1 application at  01/16/11 9604  . DISCONTD: bacitracin ointment    PRN Cristi Loron   1 application at 01/23/11 2127  . DISCONTD: bethanechol (URECHOLINE) tablet 25 mg  25 mg Oral BID Jacqulyn Ducking. Cammarata   25 mg at 01/11/11 1037  . DISCONTD: bupivacaine-EPINEPHrine (MARCAINE W/ EPI) 0.5 % (with pres) injection    PRN Cristi Loron   10 mL at 01/16/11 0749  . DISCONTD: ceFAZolin (ANCEF) IVPB 1 g/50 mL premix  1 g Intravenous Once Cristi Loron      . DISCONTD: cephALEXin (KEFLEX) capsule 500 mg  500 mg Oral Q6H Vassie Loll, MD   500 mg at 01/23/11 1813  . DISCONTD: cephALEXin (KEFLEX) capsule 500 mg  500 mg Oral Q6H Daniel J. Angiulli, PA   500 mg at 01/24/11 5409  . DISCONTD: cetirizine (ZYRTEC) chewable tablet 5 mg  5 mg Oral QHS Skip Estimable      . DISCONTD: ciprofloxacin (CIPRO) tablet 500 mg  500 mg Oral BID Michelle A. Matthews   500 mg at 01/20/11 0836  . DISCONTD: citalopram (CELEXA) tablet 10 mg  10 mg Oral Daily Jacqulyn Ducking. Cammarata   10 mg at 01/23/11 1054  . DISCONTD: dexamethasone (DECADRON) injection 6 mg  6 mg Intravenous Q6H Shanker Ghimire      . DISCONTD: dexamethasone (DECADRON) injection 8 mg  8 mg Intravenous Q6H Shanker Ghimire      . DISCONTD: dexamethasone (DECADRON) injection 8 mg  8 mg Intravenous Q6H Shanker Ghimire      . DISCONTD: dexamethasone (DECADRON) injection 8 mg  8 mg Intravenous Q6H Shanker Ghimire   8 mg at 01/11/11 0610  . DISCONTD: dexamethasone (DECADRON) injection 8 mg  8 mg Intravenous QID Stasia Cavalier, RPH   8 mg at 01/22/11 2255  . DISCONTD: dexamethasone (DECADRON) tablet 8 mg  8 mg Oral Q6H Daniel J. Angiulli, PA   8 mg at 01/23/11 1812  . DISCONTD: diazepam (VALIUM) tablet 5 mg  5 mg Oral Q6H PRN Cristi Loron   5 mg at 01/23/11 1055  . DISCONTD: dicyclomine (BENTYL) capsule 10 mg  10 mg Oral TID AC & HS Mcarthur Rossetti. Angiulli, PA      . DISCONTD: docusate sodium (COLACE) capsule 100 mg  100 mg Oral BID Cristi Loron   100 mg at 01/23/11  1056  . DISCONTD: fentaNYL (SUBLIMAZE) injection 50 mcg  50 mcg Intravenous Once Judie Petit, MD      . DISCONTD: hemostatic agents    PRN Cristi Loron   1 application at 01/16/11 [phone]  . DISCONTD: hemostatic agents    PRN Cristi Loron   1 application at 01/23/11 2037  . DISCONTD: HYDROmorphone (DILAUDID) injection 0.25-0.5 mg  0.25-0.5 mg Intravenous Q5 min PRN Aubery Lapping, MD      .  DISCONTD: hydroxypropyl methylcellulose (ISOPTO TEARS) 2.5 % ophthalmic solution 1 drop  1 drop Left Eye QID PRN Michelle A. Ashley Royalty      . DISCONTD: insulin aspart (novoLOG) injection 0-15 Units  0-15 Units Subcutaneous TID WC Mcarthur Rossetti. Angiulli, PA      . DISCONTD: insulin aspart (novoLOG) injection 0-5 Units  0-5 Units Subcutaneous QHS Vassie Loll, MD   2 Units at 01/22/11 2254  . DISCONTD: insulin glargine (LANTUS) injection 10 Units  10 Units Subcutaneous Q0700 Simbiso Ranga      . DISCONTD: insulin glargine (LANTUS) injection 8 Units  8 Units Subcutaneous Q0700 Simbiso Ranga   8 Units at 01/23/11 0740  . DISCONTD: insulin glargine (LANTUS) injection 8 Units  8 Units Subcutaneous Q0700 Mcarthur Rossetti. Angiulli, PA   8 Units at 01/24/11 0815  . DISCONTD: lactated ringers infusion   Intravenous Continuous Simbiso Ranga 20 mL/hr at 01/22/11 2330    . DISCONTD: loratadine (CLARITIN) tablet 10 mg  10 mg Oral Daily Eugene Garnet, PHARMD   10 mg at 01/12/11 1055  . DISCONTD: loratadine (CLARITIN) tablet 10 mg  10 mg Oral Daily Christina Rama   10 mg at 01/13/11 1457  . DISCONTD: loratadine (CLARITIN) tablet 10 mg  10 mg Oral Daily Mcarthur Rossetti. Angiulli, PA      . DISCONTD: LORazepam (ATIVAN) tablet 1 mg  1 mg Oral Once Cristi Loron      . DISCONTD: menthol-cetylpyridinium (CEPACOL) lozenge 3 mg  1 lozenge Oral PRN Cristi Loron      . DISCONTD: meperidine (DEMEROL) injection 6.25-12.5 mg  6.25-12.5 mg Intravenous Q5 min PRN Aubery Lapping, MD      . DISCONTD: methocarbamol (ROBAXIN)  tablet 500 mg  500 mg Oral Q6H PRN Vassie Loll, MD      . DISCONTD: methocarbamol (ROBAXIN) tablet 500 mg  500 mg Oral Q6H PRN Mcarthur Rossetti. Angiulli, PA      . DISCONTD: methocarbamol (ROBAXIN) tablet 500 mg  500 mg Oral Q6H PRN Mcarthur Rossetti. Angiulli, PA      . DISCONTD: metoprolol succinate (TOPROL-XL) 24 hr tablet 12.5 mg  12.5 mg Oral Daily Jacqulyn Ducking. Cammarata   12.5 mg at 01/13/11 1137  . DISCONTD: metoprolol succinate (TOPROL-XL) 24 hr tablet 12.5 mg  12.5 mg Oral BID Cristi Loron      . DISCONTD: metoprolol succinate (TOPROL-XL) 24 hr tablet 12.5 mg  12.5 mg Oral BID Mcarthur Rossetti. Angiulli, PA   12.5 mg at 01/24/11 0121  . DISCONTD: morphine 4 MG/ML injection 1-4 mg  1-4 mg Intravenous Q3H PRN Cristi Loron   4 mg at 01/22/11 2256  . DISCONTD: NON FORMULARY   Mouth/Throat 1 day or 1 dose Cristi Loron      . DISCONTD: ondansetron (ZOFRAN) injection 4 mg  4 mg Intravenous Q6H PRN Skip Estimable      . DISCONTD: ondansetron (ZOFRAN) injection 4 mg  4 mg Intravenous Q4H PRN Cristi Loron      . DISCONTD: ondansetron (ZOFRAN) tablet 4 mg  4 mg Oral Q6H PRN Jacqulyn Ducking. Cammarata   4 mg at 01/13/11 1458  . DISCONTD: oxyCODONE (Oxy IR/ROXICODONE) immediate release tablet 5 mg  5 mg Oral Q4H PRN Jacqulyn Ducking. Cammarata   5 mg at 01/23/11 1055  . DISCONTD: oxyCODONE-acetaminophen (PERCOCET) 5-325 MG per tablet 1-2 tablet  1-2 tablet Oral Q4H PRN Cristi Loron      . DISCONTD: pantoprazole (PROTONIX) EC tablet  40 mg  40 mg Oral Daily Jacqulyn Ducking. Cammarata   40 mg at 01/22/11 1028  . DISCONTD: pantoprazole (PROTONIX) EC tablet 40 mg  40 mg Oral BID AC Simbiso Ranga   40 mg at 01/23/11 1812  . DISCONTD: phenol (CHLORASEPTIC) mouth spray 1 spray  1 spray Mouth/Throat PRN Cristi Loron      . DISCONTD: polyethylene glycol (MIRALAX / GLYCOLAX) packet 17 g  17 g Oral BID Shanker Ghimire   17 g at 01/24/11 0120  . DISCONTD: polyvinyl alcohol (LIQUIFILM TEARS) 1.4 % ophthalmic solution 1 drop   1 drop Left Eye Q4H PRN Michelle A. Matthews   1 drop at 01/16/11 1628  . DISCONTD: senna-docusate (Senokot-S) tablet 1 tablet  1 tablet Oral BID Simbiso Ranga   1 tablet at 01/23/11 1400  . DISCONTD: simethicone (MYLICON) chewable tablet 80 mg  80 mg Oral QID Cristi Loron   80 mg at 01/23/11 1812  . DISCONTD: simvastatin (ZOCOR) tablet 20 mg  20 mg Oral q1800 Jacqulyn Ducking. Cammarata   20 mg at 01/23/11 1813  . DISCONTD: sodium chloride 0.9 % injection 3 mL  3 mL Intravenous Q12H Duane Lope Jenkins   3 mL at 01/23/11 1000  . DISCONTD: sodium chloride 0.9 % injection 3 mL  3 mL Intravenous PRN Cristi Loron      . DISCONTD: sodium chloride irrigation 0.9 %    PRN Cristi Loron   1,000 mL at 01/23/11 2037  . DISCONTD: Tamsulosin HCl (FLOMAX) capsule 0.4 mg  0.4 mg Oral BID Jacqulyn Ducking. Cammarata   0.4 mg at 01/23/11 1054  . DISCONTD: thrombin kit 5000 units    PRN Cristi Loron   5,000 Units at 01/16/11 1610  . DISCONTD: thrombin kit 5000 units    PRN Cristi Loron   5,000 Units at 01/23/11 2038  . DISCONTD: zolpidem (AMBIEN) tablet 5 mg  5 mg Oral QHS PRN Gery Pray, MD   5 mg at 01/21/11 2138  . DISCONTD: zolpidem (AMBIEN) tablet 5 mg  5 mg Oral QHS PRN Cristi Loron   5 mg at 01/22/11 2314   Medications Prior to Admission  Medication Sig Dispense Refill  . aspirin 81 MG EC tablet Take 81 mg by mouth daily. Stop taking before surgery      . bethanechol (URECHOLINE) 25 MG tablet Take 25 mg by mouth 2 (two) times daily.        . Cetirizine HCl (ZYRTEC PO) Take by mouth daily.       Marland Kitchen lovastatin (MEVACOR) 40 MG tablet Take 40 mg by mouth at bedtime.        . metoprolol succinate (TOPROL-XL) 25 MG 24 hr tablet Take 12.5 mg by mouth 2 (two) times daily.        Marland Kitchen NEXIUM 40 MG capsule Take 40 mg by mouth daily before breakfast.       . Tamsulosin HCl (FLOMAX) 0.4 MG CAPS Take 0.4 mg by mouth 2 (two) times daily.         Home: Home Living Lives With: Spouse Receives Help  From: Family;Other (Comment) Type of Home: House Home Layout: One level Home Access: Stairs to enter Entrance Stairs-Rails: Left Entrance Stairs-Number of Steps: 3 Bathroom Shower/Tub: Engineer, manufacturing systems: Standard Bathroom Accessibility: Yes Home Adaptive Equipment: Bedside commode/3-in-1;Walker - rolling;Tub transfer bench;Straight cane   Functional History: Prior Function Level of Independence: Independent with basic ADLs;Independent with homemaking with ambulation;Requires assistive device  for independence;Independent with transfers Able to Take Stairs?: Yes Vocation: Retired  Functional Status:  Mobility: Bed Mobility Bed Mobility: Yes Rolling Right: 1: +2 Total assist;With rail Rolling Right Details (indicate cue type and reason): pt=20%.  (A) to position LE's, initate & carry out rolling, sequencing.  Cues for sequencing/technqiue.   Rolling Left: 1: +2 Total assist;With rail Rolling Left Details (indicate cue type and reason): pt=20%;  (A) to bend/position bil LE's, rotate trunk/pelvis & cues for sequencing/technique.   Right Sidelying to Sit: 1: +2 Total assist;With rails Right Sidelying to Sit Details (indicate cue type and reason): pt= < 5%.  (A) for all components of transition.  Cues for sequencing, technique.   Left Sidelying to Sit: Not tested (comment) Left Sidelying to Sit Details (indicate cue type and reason): Pt demonstrated little to no truck control. Sit to Supine - Right: 1: +2 Total assist Sit to Supine - Right Details (indicate cue type and reason): pt= < 5%; (A) to control shoulders/trunk to supine position & correctly position torso/shoulders.  (A) for bil LE's.   Transfers Transfers: Yes Sit to Stand: 1: +2 Total assist;From bed Sit to Stand Details (indicate cue type and reason): Attempted sit<>stand 2x's from bed; unsuccessful at achieving standing.  Pt only able to clear hips off bed ~2-3 inches.  pt= <10%.   Stand to Sit: 1: +1 Total  assist;To bed Stand to Sit Details: Assist to control eccentric descent from squat standing with blocking at bilateral knees.  Cues for sequence. Squat Pivot Transfers: Not tested (comment) Squat Pivot Transfer Details (indicate cue type and reason): max cues and total support provided to trunk and blocking of Bilateral LE.  Ambulation/Gait Ambulation/Gait: No Ambulation/Gait Assistance: Not tested (comment) Ambulation/Gait Assistance Details (indicate cue type and reason): Assist for balance with facilitation at sacrum to increase hip extension while blocking bilateral knees to prevent flexion.  Occasional hyperextension of left knee. Ambulation Distance (Feet): 2 Feet Assistive device: 2 person hand held assist Gait Pattern: Decreased step length - right;Decreased step length - left;Decreased dorsiflexion - right;Decreased weight shift to right;Decreased weight shift to left;Trunk flexed;Shuffle Stairs: No Wheelchair Mobility Wheelchair Mobility: No  ADL: ADL Eating/Feeding: Performed;Minimal assistance Eating/Feeding Details (indicate cue type and reason): son assisted with set-up and preparation of food/containers Where Assessed - Eating/Feeding: Bed level Grooming: Performed;Wash/dry face;Set up;Moderate assistance Grooming Details (indicate cue type and reason): Required up to Max assist for sitting balance at EOB Where Assessed - Grooming: Sitting, bed Upper Body Bathing: Simulated;Supervision/safety Where Assessed - Upper Body Bathing: Sitting, chair;Supported Lower Body Bathing: Simulated;Moderate assistance Where Assessed - Lower Body Bathing: Sit to stand from bed Upper Body Dressing: Simulated;Supervision/safety Where Assessed - Upper Body Dressing: Sitting, bed Lower Body Dressing: +2 Total assistance Where Assessed - Lower Body Dressing: Sitting, bed Toilet Transfer: Simulated;Moderate assistance Toilet Transfer Method: Stand pivot Toilet Transfer Equipment:  (Bedside  chair) Toileting - Clothing Manipulation: Simulated;Moderate assistance Where Assessed - Toileting Clothing Manipulation:  (Sit to stand from bedside chair.) Toileting - Hygiene: +1 Total assistance;Performed Toileting - Hygiene Details (indicate cue type and reason): +2total(pt=20%) to roll left and right  Where Assessed - Toileting Hygiene: Rolling right and/or left Tub/Shower Transfer: Not assessed Tub/Shower Transfer Method: Not assessed Equipment Used: Rolling walker ADL Comments: Attempted sit to stand from EOB x2, able to clear bottom off bed but unable to stand. Pt +2 total (pt=less than or equal to 10%) for sit -> squat   Cognition: Cognition Arousal/Alertness: Awake/alert Orientation Level: Oriented  X4 Cognition Arousal/Alertness: Awake/alert Overall Cognitive Status: Appears within functional limits for tasks assessed Orientation Level: Oriented X4   Blood pressure 129/82, pulse 78, temperature 97.4 F (36.3 C), temperature source Oral, resp. rate 32, height 6' (1.829 m), weight 76.6 kg (168 lb 14 oz), SpO2 97.00%. Physical Exam  Constitutional: He is oriented to person, place, and time. He appears well-developed.  Neck:       Cervical collar in place  Cardiovascular: Normal rate and regular rhythm.   Pulmonary/Chest: Effort normal and breath sounds normal. He has no wheezes.  Abdominal: He exhibits no distension. There is no tenderness. There is no guarding.  Musculoskeletal: He exhibits no edema.  Neurological: He is alert and oriented to person, place, and time. He displays normal reflexes.  Reflex Scores:      Tricep reflexes are 1+ on the right side and 1+ on the left side.      Bicep reflexes are 1+ on the right side and 1+ on the left side.      Brachioradialis reflexes are 1+ on the right side and 1+ on the left side.      Patellar reflexes are 3+ on the right side and 3+ on the left side.      Achilles reflexes are 3+ on the right side and 3+ on the left  side.      One out of 2 sensation below the level of injury. Patient has 2/5 wrist extension 1/5 hand intrinsic muscle use, 4/5 biceps and triceps as well as shoulders. Lower extremities are notable for 0 to trace motor function on the right leg and trace motor function of the left leg more distally than proximally with the ankle dorsiflexors and plantar flexors.  Skin:       Surgical site clean and dry.  Psychiatric: His behavior is normal.    Results for orders placed during the hospital encounter of 01/08/11 (from the past 48 hour(s))  GLUCOSE, CAPILLARY     Status: Abnormal   Collection Time   01/24/11  5:44 PM      Component Value Range Comment   Glucose-Capillary 181 (*) 70 - 99 (mg/dL)   GLUCOSE, CAPILLARY     Status: Abnormal   Collection Time   01/24/11  9:33 PM      Component Value Range Comment   Glucose-Capillary 208 (*) 70 - 99 (mg/dL)   CBC     Status: Abnormal   Collection Time   01/25/11  3:40 AM      Component Value Range Comment   WBC 18.5 (*) 4.0 - 10.5 (K/uL)    RBC 4.63  4.22 - 5.81 (MIL/uL)    Hemoglobin 13.2  13.0 - 17.0 (g/dL)    HCT 62.1 (*) 30.8 - 52.0 (%)    MCV 82.7  78.0 - 100.0 (fL)    MCH 28.5  26.0 - 34.0 (pg)    MCHC 34.5  30.0 - 36.0 (g/dL)    RDW 65.7  84.6 - 96.2 (%)    Platelets 155  150 - 400 (K/uL)   COMPREHENSIVE METABOLIC PANEL     Status: Abnormal   Collection Time   01/25/11  3:40 AM      Component Value Range Comment   Sodium 130 (*) 135 - 145 (mEq/L)    Potassium 4.7  3.5 - 5.1 (mEq/L)    Chloride 92 (*) 96 - 112 (mEq/L)    CO2 30  19 - 32 (mEq/L)    Glucose, Bld  214 (*) 70 - 99 (mg/dL)    BUN 29 (*) 6 - 23 (mg/dL)    Creatinine, Ser 1.61  0.50 - 1.35 (mg/dL)    Calcium 8.6  8.4 - 10.5 (mg/dL)    Total Protein 5.3 (*) 6.0 - 8.3 (g/dL)    Albumin 2.5 (*) 3.5 - 5.2 (g/dL)    AST 11  0 - 37 (U/L)    ALT 18  0 - 53 (U/L)    Alkaline Phosphatase 66  39 - 117 (U/L)    Total Bilirubin 0.4  0.3 - 1.2 (mg/dL)    GFR calc non Af Amer  >90  >90 (mL/min)    GFR calc Af Amer >90  >90 (mL/min)   PROTIME-INR     Status: Normal   Collection Time   01/25/11  3:40 AM      Component Value Range Comment   Prothrombin Time 13.7  11.6 - 15.2 (seconds)    INR 1.03  0.00 - 1.49    GLUCOSE, CAPILLARY     Status: Abnormal   Collection Time   01/25/11  7:35 AM      Component Value Range Comment   Glucose-Capillary 207 (*) 70 - 99 (mg/dL)   GLUCOSE, CAPILLARY     Status: Abnormal   Collection Time   01/25/11  1:51 PM      Component Value Range Comment   Glucose-Capillary 245 (*) 70 - 99 (mg/dL)   GLUCOSE, CAPILLARY     Status: Abnormal   Collection Time   01/25/11  4:07 PM      Component Value Range Comment   Glucose-Capillary 274 (*) 70 - 99 (mg/dL)    Comment 1 Notify RN      Comment 2 Documented in Chart     GLUCOSE, CAPILLARY     Status: Abnormal   Collection Time   01/25/11 10:36 PM      Component Value Range Comment   Glucose-Capillary 191 (*) 70 - 99 (mg/dL)    Comment 1 Notify RN      Comment 2 Documented in Chart     CBC     Status: Abnormal   Collection Time   01/26/11  4:00 AM      Component Value Range Comment   WBC 17.5 (*) 4.0 - 10.5 (K/uL)    RBC 4.69  4.22 - 5.81 (MIL/uL)    Hemoglobin 13.7  13.0 - 17.0 (g/dL)    HCT 09.6 (*) 04.5 - 52.0 (%)    MCV 82.3  78.0 - 100.0 (fL)    MCH 29.2  26.0 - 34.0 (pg)    MCHC 35.5  30.0 - 36.0 (g/dL)    RDW 40.9  81.1 - 91.4 (%)    Platelets 159  150 - 400 (K/uL)   DIFFERENTIAL     Status: Abnormal   Collection Time   01/26/11  4:00 AM      Component Value Range Comment   Neutrophils Relative 94 (*) 43 - 77 (%)    Neutro Abs 16.4 (*) 1.7 - 7.7 (K/uL)    Lymphocytes Relative 3 (*) 12 - 46 (%)    Lymphs Abs 0.5 (*) 0.7 - 4.0 (K/uL)    Monocytes Relative 3  3 - 12 (%)    Monocytes Absolute 0.6  0.1 - 1.0 (K/uL)    Eosinophils Relative 0  0 - 5 (%)    Eosinophils Absolute 0.0  0.0 - 0.7 (K/uL)    Basophils Relative 0  0 - 1 (%)    Basophils Absolute 0.0  0.0 -  0.1 (K/uL)   COMPREHENSIVE METABOLIC PANEL     Status: Abnormal   Collection Time   01/26/11  4:00 AM      Component Value Range Comment   Sodium 126 (*) 135 - 145 (mEq/L)    Potassium 4.8  3.5 - 5.1 (mEq/L)    Chloride 89 (*) 96 - 112 (mEq/L)    CO2 29  19 - 32 (mEq/L)    Glucose, Bld 233 (*) 70 - 99 (mg/dL)    BUN 30 (*) 6 - 23 (mg/dL)    Creatinine, Ser 4.09  0.50 - 1.35 (mg/dL)    Calcium 8.6  8.4 - 10.5 (mg/dL)    Total Protein 5.2 (*) 6.0 - 8.3 (g/dL)    Albumin 2.5 (*) 3.5 - 5.2 (g/dL)    AST 10  0 - 37 (U/L)    ALT 19  0 - 53 (U/L)    Alkaline Phosphatase 63  39 - 117 (U/L)    Total Bilirubin 0.4  0.3 - 1.2 (mg/dL)    GFR calc non Af Amer 88 (*) >90 (mL/min)    GFR calc Af Amer >90  >90 (mL/min)   GLUCOSE, CAPILLARY     Status: Abnormal   Collection Time   01/26/11 11:17 AM      Component Value Range Comment   Glucose-Capillary 266 (*) 70 - 99 (mg/dL)    No results found.  Post Admission Physician Evaluation: 1. Functional deficits secondary  to cervical stenosis with myelopathy status post decompression. Patient with postoperative epidural hematoma requiring surgical reexploration and evacuation. 2. Patient is admitted to receive collaborative, interdisciplinary care between the physiatrist, rehab nursing staff, and therapy team. 3. Patient's level of medical complexity and substantial therapy needs in context of that medical necessity cannot be provided at a lesser intensity of care such as a SNF. 4. Patient has experienced substantial functional loss from his/her baseline which was documented above under the "Functional History" and "Functional Status" headings.  Judging by the patient's diagnosis, physical exam, and functional history, the patient has potential for functional progress which will result in measurable gains while on inpatient rehab.  These gains will be of substantial and practical use upon discharge  in facilitating mobility and self-care at the household  level. 5. Physiatrist will provide 24 hour management of medical needs as well as oversight of the therapy plan/treatment and provide guidance as appropriate regarding the interaction of the two. 6. 24 hour rehab nursing will assist with bladder management, bowel management, safety, skin/wound care, disease management, medication administration, pain management and patient education  and help integrate therapy concepts, techniques,education, etc. 7. PT will assess and treat for:  Lower extremity strength, adaptive techniques, range of motion, pain management, transfer technique.  Goals are: Supervision with wheelchair mobility and minimal assist to moderate assist with transfers.. 8. OT will assess and treat for: Upper extremity strength, adaptive techniques and equipment, neuromuscular reeducation, transfer training, and ADL training.   Goals are: Supervision to moderate assistance. 9. SLP will assess and treat for: Not app 10. Case Management and Social Worker will assess and treat for psychological issues and discharge planning. 11. Team conference will be held weekly to assess progress toward goals and to determine barriers to discharge. 12.  Patient will receive at least 3 hours of therapy per day at least 5 days per week. 13. ELOS and Prognosis: 4 weeks good  Medical Problem List and Plan: 1. Cervical stenosis with myelopathy-status post cervical discectomy C 6-7 anterior cervical discectomy with decompression December 3. Post operative epidural hematoma evacuation of hematoma December 10. Cervical collar at all times. Decadron taper as indicated to 1 mg every 6 hours 2.. DVT Prophylaxis/Anticoagulation: SCDs and support hose. Monitor for deep vein thrombosis. Admission lower extremity venous Dopplers will be ordered 3. Pain Management: Oxycodone/Valium as needed. Monitor with increased activity. Patient currently denies any pain. 4. Steroid-induced hyperglycemia- Check blood sugars a.c. and  at bedtime. Should do much better now that Decadron has been tapered and monitored closely 5. Neurogenic bladder/hematuria/BPH-Foley catheter tube currently in place after a failed voiding trial. Continue Flomax twice daily. No current hematuria reported. Attempt voiding trial once established on rehabilitation and as functional mobility increases. 6. neurogenic bowel -establish a bowel program and begin scheduled laxatives. Patient is no current nausea or vomiting 7. Hypertension-Toprol 12.5 mg daily. Monitor with increased activity 8. Depression-continue Celexa daily. Emotional support and positive reinforcement 9. Hyperlipidemia-Zocor     01/26/2011, 2:08 PM

## 2011-01-26 NOTE — Progress Notes (Signed)
Patient ID: Ricky Bradley, male   DOB: 02-23-1933, 77 y.o.   MRN: [id number] Patient ID: Ricky Bradley, male   DOB: 04/07/1933, 77 y.o.   MRN: [id number] Subjective: Patient seen.Mild generalised aches but improved compared to previuos pains.Denies any chest pain or sob  Objective: Weight change:   Intake/Output Summary (Last 24 hours) at 01/26/11 1415 Last data filed at 01/26/11 1300  Gross per 24 hour  Intake   1560 ml  Output   3030 ml  Net  -1470 ml   BP 129/82  Pulse 78  Temp(Src) 97.4 F (36.3 C) (Oral)  Resp 32  Ht 6' (1.829 m)  Wt 76.6 kg (168 lb 14 oz)  BMI 22.90 kg/m2  SpO2 97% Physical Exam: General appearance: alert, cooperative and no distress Head: Normocephalic, without obvious abnormality, atraumatic Neck: no adenopathy, no carotid bruit, no JVD, supple, symmetrical, trachea midline and thyroid not enlarged, symmetric, no tenderness/mass/nodules,neck collar Lungs: clear to auscultation bilaterally Heart: regular rate and rhythm, S1, S2 normal, no murmur, click, rub or gallop Abdomen: soft, non-tender; bowel sounds normal; no masses,  no organomegaly Extremities: extremities normal, atraumatic, no cyanosis or edema Skin: Skin color, texture, turgor normal. No rashes or lesions Neuro-muscle power:RUE-5/5.LUE-5/5.SENSORY SENSATION-INTACT                                    RLE-2/5.  LLE-2/5  Lab Results: Results for orders placed during the hospital encounter of 01/08/11 (from the past 48 hour(s))  GLUCOSE, CAPILLARY     Status: Abnormal   Collection Time   01/24/11  5:44 PM      Component Value Range Comment   Glucose-Capillary 181 (*) 70 - 99 (mg/dL)   GLUCOSE, CAPILLARY     Status: Abnormal   Collection Time   01/24/11  9:33 PM      Component Value Range Comment   Glucose-Capillary 208 (*) 70 - 99 (mg/dL)   CBC     Status: Abnormal   Collection Time   01/25/11  3:40 AM      Component Value Range Comment   WBC 18.5 (*) 4.0 - 10.5 (K/uL)    RBC 4.63  4.22 -  5.81 (MIL/uL)    Hemoglobin 13.2  13.0 - 17.0 (g/dL)    HCT 29.5 (*) 28.4 - 52.0 (%)    MCV 82.7  78.0 - 100.0 (fL)    MCH 28.5  26.0 - 34.0 (pg)    MCHC 34.5  30.0 - 36.0 (g/dL)    RDW 13.2  44.0 - 10.2 (%)    Platelets 155  150 - 400 (K/uL)   COMPREHENSIVE METABOLIC PANEL     Status: Abnormal   Collection Time   01/25/11  3:40 AM      Component Value Range Comment   Sodium 130 (*) 135 - 145 (mEq/L)    Potassium 4.7  3.5 - 5.1 (mEq/L)    Chloride 92 (*) 96 - 112 (mEq/L)    CO2 30  19 - 32 (mEq/L)    Glucose, Bld 214 (*) 70 - 99 (mg/dL)    BUN 29 (*) 6 - 23 (mg/dL)    Creatinine, Ser 7.25  0.50 - 1.35 (mg/dL)    Calcium 8.6  8.4 - 10.5 (mg/dL)    Total Protein 5.3 (*) 6.0 - 8.3 (g/dL)    Albumin 2.5 (*) 3.5 - 5.2 (g/dL)    AST 11  0 - 37 (U/L)    ALT 18  0 - 53 (U/L)    Alkaline Phosphatase 66  39 - 117 (U/L)    Total Bilirubin 0.4  0.3 - 1.2 (mg/dL)    GFR calc non Af Amer >90  >90 (mL/min)    GFR calc Af Amer >90  >90 (mL/min)   PROTIME-INR     Status: Normal   Collection Time   01/25/11  3:40 AM      Component Value Range Comment   Prothrombin Time 13.7  11.6 - 15.2 (seconds)    INR 1.03  0.00 - 1.49    GLUCOSE, CAPILLARY     Status: Abnormal   Collection Time   01/25/11  7:35 AM      Component Value Range Comment   Glucose-Capillary 207 (*) 70 - 99 (mg/dL)   GLUCOSE, CAPILLARY     Status: Abnormal   Collection Time   01/25/11  1:51 PM      Component Value Range Comment   Glucose-Capillary 245 (*) 70 - 99 (mg/dL)   GLUCOSE, CAPILLARY     Status: Abnormal   Collection Time   01/25/11  4:07 PM      Component Value Range Comment   Glucose-Capillary 274 (*) 70 - 99 (mg/dL)    Comment 1 Notify RN      Comment 2 Documented in Chart     GLUCOSE, CAPILLARY     Status: Abnormal   Collection Time   01/25/11 10:36 PM      Component Value Range Comment   Glucose-Capillary 191 (*) 70 - 99 (mg/dL)    Comment 1 Notify RN      Comment 2 Documented in Chart     CBC      Status: Abnormal   Collection Time   01/26/11  4:00 AM      Component Value Range Comment   WBC 17.5 (*) 4.0 - 10.5 (K/uL)    RBC 4.69  4.22 - 5.81 (MIL/uL)    Hemoglobin 13.7  13.0 - 17.0 (g/dL)    HCT 16.1 (*) 09.6 - 52.0 (%)    MCV 82.3  78.0 - 100.0 (fL)    MCH 29.2  26.0 - 34.0 (pg)    MCHC 35.5  30.0 - 36.0 (g/dL)    RDW 04.5  40.9 - 81.1 (%)    Platelets 159  150 - 400 (K/uL)   DIFFERENTIAL     Status: Abnormal   Collection Time   01/26/11  4:00 AM      Component Value Range Comment   Neutrophils Relative 94 (*) 43 - 77 (%)    Neutro Abs 16.4 (*) 1.7 - 7.7 (K/uL)    Lymphocytes Relative 3 (*) 12 - 46 (%)    Lymphs Abs 0.5 (*) 0.7 - 4.0 (K/uL)    Monocytes Relative 3  3 - 12 (%)    Monocytes Absolute 0.6  0.1 - 1.0 (K/uL)    Eosinophils Relative 0  0 - 5 (%)    Eosinophils Absolute 0.0  0.0 - 0.7 (K/uL)    Basophils Relative 0  0 - 1 (%)    Basophils Absolute 0.0  0.0 - 0.1 (K/uL)   COMPREHENSIVE METABOLIC PANEL     Status: Abnormal   Collection Time   01/26/11  4:00 AM      Component Value Range Comment   Sodium 126 (*) 135 - 145 (mEq/L)    Potassium 4.8  3.5 - 5.1 (mEq/L)  Chloride 89 (*) 96 - 112 (mEq/L)    CO2 29  19 - 32 (mEq/L)    Glucose, Bld 233 (*) 70 - 99 (mg/dL)    BUN 30 (*) 6 - 23 (mg/dL)    Creatinine, Ser 9.81  0.50 - 1.35 (mg/dL)    Calcium 8.6  8.4 - 10.5 (mg/dL)    Total Protein 5.2 (*) 6.0 - 8.3 (g/dL)    Albumin 2.5 (*) 3.5 - 5.2 (g/dL)    AST 10  0 - 37 (U/L)    ALT 19  0 - 53 (U/L)    Alkaline Phosphatase 63  39 - 117 (U/L)    Total Bilirubin 0.4  0.3 - 1.2 (mg/dL)    GFR calc non Af Amer 88 (*) >90 (mL/min)    GFR calc Af Amer >90  >90 (mL/min)   GLUCOSE, CAPILLARY     Status: Abnormal   Collection Time   01/26/11 11:17 AM      Component Value Range Comment   Glucose-Capillary 266 (*) 70 - 99 (mg/dL)     Micro Results: Recent Results (from the past 240 hour(s))  MRSA PCR SCREENING     Status: Normal   Collection Time   01/24/11   6:55 AM      Component Value Range Status Comment   MRSA by PCR NEGATIVE  NEGATIVE  Final     Studies/Results: Dg Chest 2 View  01/11/2011  *RADIOLOGY REPORT*  Clinical Data: Preoperative chest radiograph for cervical spine surgery.  CHEST - 2 VIEW  Comparison: Chest radiograph performed 01/08/2011  Findings: The lungs are well-aerated and clear.  There is no evidence of focal opacification, pleural effusion or pneumothorax. The right costophrenic angle is incompletely imaged on the frontal view.  The heart is normal in size; the mediastinal contour is within normal limits.  No acute osseous abnormalities are seen.  Mild anterior bridging osteophytes are noted along the lower thoracic spine.  IMPRESSION: No acute cardiopulmonary process seen.  Original Report Authenticated By: Tonia Ghent, M.D.   Dg Chest 2 View  01/08/2011  *RADIOLOGY REPORT*  Clinical Data: Numbness in both legs since yesterday.  Weakness.  CHEST - 2 VIEW  Comparison: CT 11/02/2010.  Plain films 08/18/2010.  Findings: Lateral view degraded by patient arm position.  Mild thoracic spondylosis.  Apical lordotic frontal view. Midline trachea.  Normal heart size and mediastinal contours. No pleural effusion or pneumothorax.  Clear lungs.  IMPRESSION: Normal chest.  Original Report Authenticated By: Consuello Bossier, M.D.   Dg Cervical Spine 1 View  01/23/2011  *RADIOLOGY REPORT*  Clinical Data: Hematoma.  DG SPINE PORTABLE - 1 VIEW  Comparison: 01/16/2011  Findings: Cross-table lateral view of the cervical spine obtained. C7 and T1 are not visualized due to soft tissue attenuation. Postoperative changes with anterior plate and screw fixation with intervertebral fusion at C6-7, incompletely visualized on this film.  This has been placed since the previous intraoperative study.  As visualized, the cervical vertebra appear to be in normal alignment.  Prominent degenerative changes throughout cervical spine with prominent bridging anterior  osteophytes and probable ankylosis or  fusion at C4-5 and C5-6 levels. NG and endotracheal tubes are in place.  IMPRESSION: Postoperative changes at C6-7, incompletely visualized.  Diffuse degenerative changes.  Normal alignment of visualized vertebrae.  Original Report Authenticated By: Marlon Pel, M.D.   Dg Cervical Spine 2-3 Views  01/16/2011  *RADIOLOGY REPORT*  Clinical Data: Anterior fusion at C6-7  CERVICAL SPINE -  2-3 VIEW  Comparison: MR c-spine of 01/09/2011  Findings: A cross-table lateral portable view shows an instrument for localization at the C6-7 level.  Considerable osteophyte formation is noted anteriorly from C2-C5 with apparent fusion at C5- 6.  A second portable films shows placement of anterior fixation plate at the F6-2 level with no change in alignment.  IMPRESSION: Anterior fusion at C6-7.  Normal alignment.  Diffuse degenerative change.  Original Report Authenticated By: Juline Patch, M.D.   Ct Head Wo Contrast  01/08/2011  *RADIOLOGY REPORT*  Clinical Data: Bilateral leg weakness  CT HEAD WITHOUT CONTRAST  Technique:  Contiguous axial images were obtained from the base of the skull through the vertex without contrast.  Comparison: 01/27/2007  Findings: Mild atrophy. There is no evidence of acute intracranial hemorrhage, brain edema, mass lesion, acute infarction,   mass effect, or midline shift. Acute infarct may be inapparent on noncontrast CT.  No other intra-axial abnormalities are seen, and the ventricles and sulci are within normal limits in size and symmetry.   No abnormal extra-axial fluid collections or masses are identified.  No significant calvarial abnormality.  IMPRESSION: 1. Negative for bleed or other acute intracranial process.  Original Report Authenticated By: Osa Craver, M.D.   Mr Laqueta Jean ZH Contrast  01/09/2011  *RADIOLOGY REPORT*  Clinical Data: Lower extremity weakness.  MRI HEAD WITHOUT AND WITH CONTRAST  Technique:  Multiplanar,  multiecho pulse sequences of the brain and surrounding structures were obtained according to standard protocol without and with intravenous contrast  Contrast: 1 MULTIHANCE GADOBENATE DIMEGLUMINE 529 MG/ML IV SOLN  Comparison: CT 01/08/2011  Findings: Age appropriate atrophy.  Slight chronic microvascular ischemic change in the white matter.  Negative for acute infarct. Negative for hemorrhage or mass.  Postcontrast imaging the brain reveals normal enhancement.  Mild mucosal edema in the paranasal sinuses.  Joint effusion involving the right C1-C2 joint.  IMPRESSION: No acute intracranial abnormality.  Original Report Authenticated By: Camelia Phenes, M.D.   Mr Cervical Spine Wo Contrast  01/23/2011  *RADIOLOGY REPORT*  Clinical Data: 77 year old male with quadriparesis.  Neck pain.  MRI CERVICAL SPINE WITHOUT CONTRAST  Technique:  Multiplanar and multiecho pulse sequences of the cervical spine, to include the craniocervical junction and cervicothoracic junction, were obtained according to standard protocol without intravenous contrast.  Comparison: Intraoperative radiographs 01/16/2011.  Preoperative study 01/09/2011.  Findings: The patient is undergone interval C6-C7 ACDF.  However, cord compression at this level has not abated and appears somewhat worse as a result of a broad-based ventral epidural space occupying lesion which is nearly isointense to the spinal cord on T1 and STIR images, and dark on sagittal T2 (series 3 image 70). The ventral epidural collection tracks from the C5-C6 disc space to the C7-T1 disc space.  The thecal sac is virtually obliterated at this level (series 6 image 17) when combined with the preexisting severe ligament flavum hypertrophy at this level.  There is abnormal signal in the spinal cord, which to a degree appears similar to that on the preoperative study.  Prevertebral soft tissues are within normal limits.  Mild susceptibility artifact related to the C6-C7 ACDF hardware is  noted.  No definite marrow edema.  Severe facet hypertrophy at C6- C7 with trace fluid in the facet joints is re-identified.  Above and below the C6-C7 level the cervical spinal canal is stable.  The visualized upper thoracic canal remains widely patent.  Bulky flowing ventral cervical osteophytes are re-identified. Cervicomedullary  junction is within normal limits.  Stable right thyroid nodule.  IMPRESSION: 1.  Interval C6-C7 ACDF with continued/progressed spinal cord compression at this level now in large part to a broad-based ventral epidural process, favor epidural hematoma. Critical Value/emergent results were called by telephone at the time of interpretation on 01/23/2011  at 1643 hours  to  Dr. Delma Officer in the neuro OR, who verbally acknowledged these results. 2.  Stable cervical levels elsewhere.  Original Report Authenticated By: Harley Hallmark, M.D.   Mr Cervical Spine W Wo Contrast  01/24/2011  **ADDENDUM** CREATED: 01/09/2011 13:34:23  Critical Value/emergent results were called by telephone at the time of interpretation on 01/09/2011  at 1136 hours  to  Dr. Jerral Ralph, who verbally acknowledged these results.  **END ADDENDUM** SIGNED BY: Andreas Newport, M.D.   01/09/2011  **ADDENDUM** CREATED: 01/09/2011 13:34:23  Critical Value/emergent results were called by telephone at the time of interpretation on 01/09/2011  at 1136 hours  to  Dr. Jerral Ralph, who verbally acknowledged these results.  **END ADDENDUM** SIGNED BY: Andreas Newport, M.D.   01/09/2011  *RADIOLOGY REPORT*  Clinical Data:  Leg weakness.  Lower extremity weakness and inability to ambulate.  Spinal stenosis.  MRI CERVICAL SPINE WITHOUT AND WITH CONTRAST  Technique:  Multiplanar and multiecho pulse sequences of the cervic al spine, to include the craniocervical junction and cervicothoraci c junction, were obtained according to standard protocol without an d with intravenous contrast.  Contrast: 15 ml Multihance  Comparison: None.   Findings:  Exaggeration of the normal cervical lordosis.  There appears to be ankylosis extending from C4-C6, with confluent anterior osteophytes. The appearance is compatible with diffuse idiopathic skeletal hyperostosis. Cervical cord edema is present associated with cervical cord compression at C6-C7.  There is probable ankylosis across C2-C3 as well.  Small right greater than left atlanto-occipital effusions are present.  Craniocervical junction appears normal. Nonspecific cystic appearing lesion is present in the right thyroid lobe measuring 15 mm.  C2-C3:  Central canal patent.  Left foraminal encroachment associated with facet arthrosis.  C3-C4:  Mild central stenosis associated with broad-based disc osteophyte complex.  Mild right posterior ligamentum flavum redundancy.  Disc osteophyte complex contacts the ventral aspect of the cervical cord and may flattened it slightly.  Right greater than left foraminal stenosis associated with uncovertebral spurring and facet arthrosis.  C4-C5:  Apparent ankylosis across the disc space.  Central canal and foramina appear patent.  C5-C6:  Facet hypertrophy.  Apparent ankylosis.  Mild central stenosis associated with osseous ridging.  No definite cord deformity.  C6-C7:  Severe central stenosis with compression of the cervical cord. Mild edema is present above and below the levels of compression.  Broad-based disc osteophyte complex and posterior ligamentum flavum redundancy produce the severe central stenosis. Bilateral foraminal stenosis secondary uncovertebral spurring. Bilateral facet effusions.  C7-T1:  Severe right facet arthrosis with right foraminal stenosis potentially affecting the right C8 nerve.  The foramen appears patent. Minimal central stenosis associated with posterior element hypertrophy and ligamentum flavum redundancy.  IMPRESSION: 1.  Cervical cord compression at C6-C7 with cord edema and / or myelomalacia.  Severe spinal stenosis due to broad-based  disc osteophyte complex and posterior ligamentum flavum redundancy. 2.  Diffuse idiopathic skeletal hyperostosis with ankylosis from C4- C6, probably extending to C2 and C3 as well. 3.  Mild C3-C4 central stenosis secondary to disc osteophyte complex.  Right greater than left foraminal stenosis associated uncovertebral spurring.  MRI THORACIC SPINE WITHOUT AND  WITH CONTRAST  Technique:  Multiplanar and multiecho pulse sequences of the thoracic spine were obtained without and with intravenous contrast.  Contrast: 15 MULTIHANCE GADOBENATE DIMEGLUMINE 529 MG/ML IV SOLN  Findings:  Mild dextroconvex curvature of the thoracic spine is present.  Remote compression fractures T7 and T8, with approximately 25% loss of vertebral body height.  No marrow edema. The thoracic cord demonstrates normal caliber and signal. Paraspinal soft tissues are within normal limits.  Scattered vertebral body hemangiomata are present.  High signal is present within the discs at T6-T7 and T7-T8 with post-gadolinium enhancement.  There is no endplate edema, erosion or enhancement and this is compatible with degenerative enhancement of the discs.  This may also be due to ossification of the discs with development of marrow space.  Thoracic spine DISH is present.  Tiny central disc protrusion is present at T7-T8, just contacting the ventral aspect of the thoracic cord.  Per CMS PQRS reporting requirements (PQRS Measure 24): Given the patient's age of greater than 50 and the fracture site (hip, distal radius, or spine), the patient should be tested for osteoporosis using DXA, and the appropriate treatment considered based on the DXA results.  IMPRESSION: No thoracic cord compression.  Thoracic spine DISH and chronic compression fractures of T7 and T8 (25% loss of height) without retropulsion. Original Report Authenticated By: Andreas Newport, M.D.   Mr Thoracic Spine W Wo Contrast  01/24/2011  **ADDENDUM** CREATED: 01/09/2011 13:34:23  Critical  Value/emergent results were called by telephone at the time of interpretation on 01/09/2011  at 1136 hours  to  Dr. Jerral Ralph, who verbally acknowledged these results.  **END ADDENDUM** SIGNED BY: Andreas Newport, M.D.   01/09/2011  **ADDENDUM** CREATED: 01/09/2011 13:34:23  Critical Value/emergent results were called by telephone at the time of interpretation on 01/09/2011  at 1136 hours  to  Dr. Jerral Ralph, who verbally acknowledged these results.  **END ADDENDUM** SIGNED BY: Andreas Newport, M.D.   01/09/2011  *RADIOLOGY REPORT*  Clinical Data:  Leg weakness.  Lower extremity weakness and inability to ambulate.  Spinal stenosis.  MRI CERVICAL SPINE WITHOUT AND WITH CONTRAST  Technique:  Multiplanar and multiecho pulse sequences of the cervic al spine, to include the craniocervical junction and cervicothoraci c junction, were obtained according to standard protocol without an d with intravenous contrast.  Contrast: 15 ml Multihance  Comparison: None.  Findings:  Exaggeration of the normal cervical lordosis.  There appears to be ankylosis extending from C4-C6, with confluent anterior osteophytes. The appearance is compatible with diffuse idiopathic skeletal hyperostosis. Cervical cord edema is present associated with cervical cord compression at C6-C7.  There is probable ankylosis across C2-C3 as well.  Small right greater than left atlanto-occipital effusions are present.  Craniocervical junction appears normal. Nonspecific cystic appearing lesion is present in the right thyroid lobe measuring 15 mm.  C2-C3:  Central canal patent.  Left foraminal encroachment associated with facet arthrosis.  C3-C4:  Mild central stenosis associated with broad-based disc osteophyte complex.  Mild right posterior ligamentum flavum redundancy.  Disc osteophyte complex contacts the ventral aspect of the cervical cord and may flattened it slightly.  Right greater than left foraminal stenosis associated with uncovertebral spurring and  facet arthrosis.  C4-C5:  Apparent ankylosis across the disc space.  Central canal and foramina appear patent.  C5-C6:  Facet hypertrophy.  Apparent ankylosis.  Mild central stenosis associated with osseous ridging.  No definite cord deformity.  C6-C7:  Severe central stenosis with compression of the  cervical cord. Mild edema is present above and below the levels of compression.  Broad-based disc osteophyte complex and posterior ligamentum flavum redundancy produce the severe central stenosis. Bilateral foraminal stenosis secondary uncovertebral spurring. Bilateral facet effusions.  C7-T1:  Severe right facet arthrosis with right foraminal stenosis potentially affecting the right C8 nerve.  The foramen appears patent. Minimal central stenosis associated with posterior element hypertrophy and ligamentum flavum redundancy.  IMPRESSION: 1.  Cervical cord compression at C6-C7 with cord edema and / or myelomalacia.  Severe spinal stenosis due to broad-based disc osteophyte complex and posterior ligamentum flavum redundancy. 2.  Diffuse idiopathic skeletal hyperostosis with ankylosis from C4- C6, probably extending to C2 and C3 as well. 3.  Mild C3-C4 central stenosis secondary to disc osteophyte complex.  Right greater than left foraminal stenosis associated uncovertebral spurring.  MRI THORACIC SPINE WITHOUT AND WITH CONTRAST  Technique:  Multiplanar and multiecho pulse sequences of the thoracic spine were obtained without and with intravenous contrast.  Contrast: 15 MULTIHANCE GADOBENATE DIMEGLUMINE 529 MG/ML IV SOLN  Findings:  Mild dextroconvex curvature of the thoracic spine is present.  Remote compression fractures T7 and T8, with approximately 25% loss of vertebral body height.  No marrow edema. The thoracic cord demonstrates normal caliber and signal. Paraspinal soft tissues are within normal limits.  Scattered vertebral body hemangiomata are present.  High signal is present within the discs at T6-T7 and T7-T8  with post-gadolinium enhancement.  There is no endplate edema, erosion or enhancement and this is compatible with degenerative enhancement of the discs.  This may also be due to ossification of the discs with development of marrow space.  Thoracic spine DISH is present.  Tiny central disc protrusion is present at T7-T8, just contacting the ventral aspect of the thoracic cord.  Per CMS PQRS reporting requirements (PQRS Measure 24): Given the patient's age of greater than 50 and the fracture site (hip, distal radius, or spine), the patient should be tested for osteoporosis using DXA, and the appropriate treatment considered based on the DXA results.  IMPRESSION: No thoracic cord compression.  Thoracic spine DISH and chronic compression fractures of T7 and T8 (25% loss of height) without retropulsion. Original Report Authenticated By: Andreas Newport, M.D.   Mr Lumbar Spine W Wo Contrast  01/08/2011  *RADIOLOGY REPORT*  Clinical Data: Bilateral lower extremity weakness.  History of prior lumbar spine surgery.  Progressive worsening weakness in the lower extremities.  MRI LUMBAR SPINE WITHOUT AND WITH CONTRAST  Technique:  Multiplanar and multiecho pulse sequences of the lumbar spine were obtained without and with intravenous contrast.  Contrast:  17 ml Multihance.  Comparison: MRI lumbar spine 09/14/2010.  Findings: Numbering convention used on prior exam preserved. Vertebral body height and marrow signal is within normal limits. Paraspinal soft tissues are within normal limits.  Distention of the urinary bladder is present.  No destructive osseous lesions. Postoperative changes of L4 laminectomy.  The small fluid collection in the operative bed seen on the recent comparison has resolved.  T12-L1:  Negative.  L1-L2:  Shallow posterior disc bulging without stenosis.  Mild facet hypertrophy and ligamentum flavum redundancy.  L2-L3:  Mild facet hypertrophy.  No stenosis.  L3-L4:  Bilateral facet hypertrophy and  ligamentum flavum redundancy with mild central stenosis.  Unchanged mild symmetric bilateral foraminal stenosis.  L4-L5:  Laminectomy with wide posterior decompression.  Central canal and lateral recesses are patent.  The enhancing granulation tissue is present in the operative bed and the epidural space.  No epidural abscess or fluid collection.  Short pedicles are present with mild left foraminal stenosis.  Facet hypertrophy is present.  L5-S1:  Disc desiccation and degeneration.  Degenerative endplate changes are present.  Central canal and lateral recesses are patent.  Mild left foraminal stenosis associated with endplate spurring and facet hypertrophy.  The left greater than right facet degeneration/hypertrophy.  IMPRESSION:  1.  Evolving postoperative changes of L4 laminectomy with good decompression of the thecal sac and lateral recesses.  Mild left foraminal stenosis associated with short pedicles and facet hypertrophy potentially affecting the left L4 nerve. Resolution of small fluid collection in the operative bed.  Expected enhancing granulation tissue in the operative bed. 2.  Unchanged L5-S1 degenerative disease with mild left foraminal stenosis secondary endplate spurring and facet hypertrophy. 3.  Unchanged L3-L4 facet arthrosis with mild central stenosis.  Original Report Authenticated By: Andreas Newport, M.D.   Medications: Scheduled Meds:    . citalopram  10 mg Oral Daily  . dexamethasone  8 mg Oral Q6H  . dicyclomine  10 mg Oral TID AC & HS  . docusate sodium  100 mg Oral BID  . insulin aspart  0-15 Units Subcutaneous TID WC  . insulin glargine  10 Units Subcutaneous Q0700  . loratadine  10 mg Oral Daily  . metoprolol succinate  12.5 mg Oral BID  . pantoprazole  40 mg Oral BID AC  . polyethylene glycol  17 g Oral BID  . simethicone  80 mg Oral QID  . simvastatin  20 mg Oral q1800  . Tamsulosin HCl  0.4 mg Oral BID   Continuous Infusions:    . lactated ringers     PRN  Meds:.acetaminophen, acetaminophen, alum & mag hydroxide-simeth, bisacodyl, diazepam, gi cocktail, menthol-cetylpyridinium, morphine, ondansetron (ZOFRAN) IV, oxyCODONE, phenol, polyvinyl alcohol, promethazine, sodium phosphate, zolpidem  Assessment/Plan: #1 Cord compression-secondary to epidural hematoma s/p diskectomy with evacuation of clot.Patient presently has paraparesis.Since hematological indices showed leucocytosis with a left shift,will add primaxin to regimen #2 HTN (hypertension)-Bp fairly stable #3 Dyslipidemia-continue zocor #4 BPH (benign prostatic hyperplasia)-will continue flomax #5 GERD (gastroesophageal reflux disease)-continue protonix #6 Lumbar stenosis with neurogenic claudication-physical therapy #7Irritable bowel syndrome (IBS)  #8 Gross hematuria?secondary to bph-resolving #9 Hyperglycemia-?steroid induced.Will continue insulin sliding scale Awaiting authorization from neuro-surgeon for patient to be transferred to lower level of care.    LOS: 18 days   Rei Medlen 01/26/2011, 2:15 PM

## 2011-01-27 ENCOUNTER — Inpatient Hospital Stay (HOSPITAL_COMMUNITY)
Admission: RE | Admit: 2011-01-27 | Discharge: 2011-02-24 | DRG: 945 | Disposition: A | Discharge status: Skilled Nursing Facility | Payer: Medicare Other | Source: Ambulatory Visit | Attending: Physical Medicine & Rehabilitation | Admitting: Physical Medicine & Rehabilitation

## 2011-01-27 DIAGNOSIS — G959 Disease of spinal cord, unspecified: Secondary | ICD-10-CM

## 2011-01-27 DIAGNOSIS — M4712 Other spondylosis with myelopathy, cervical region: Secondary | ICD-10-CM | POA: Diagnosis present

## 2011-01-27 DIAGNOSIS — N138 Other obstructive and reflux uropathy: Secondary | ICD-10-CM | POA: Diagnosis present

## 2011-01-27 DIAGNOSIS — F3289 Other specified depressive episodes: Secondary | ICD-10-CM | POA: Diagnosis present

## 2011-01-27 DIAGNOSIS — K922 Gastrointestinal hemorrhage, unspecified: Secondary | ICD-10-CM

## 2011-01-27 DIAGNOSIS — Z981 Arthrodesis status: Secondary | ICD-10-CM

## 2011-01-27 DIAGNOSIS — F329 Major depressive disorder, single episode, unspecified: Secondary | ICD-10-CM | POA: Diagnosis present

## 2011-01-27 DIAGNOSIS — Z96659 Presence of unspecified artificial knee joint: Secondary | ICD-10-CM

## 2011-01-27 DIAGNOSIS — E785 Hyperlipidemia, unspecified: Secondary | ICD-10-CM | POA: Diagnosis present

## 2011-01-27 DIAGNOSIS — N401 Enlarged prostate with lower urinary tract symptoms: Secondary | ICD-10-CM | POA: Diagnosis present

## 2011-01-27 DIAGNOSIS — G8250 Quadriplegia, unspecified: Secondary | ICD-10-CM

## 2011-01-27 DIAGNOSIS — K219 Gastro-esophageal reflux disease without esophagitis: Secondary | ICD-10-CM | POA: Diagnosis present

## 2011-01-27 DIAGNOSIS — R1310 Dysphagia, unspecified: Secondary | ICD-10-CM | POA: Diagnosis present

## 2011-01-27 DIAGNOSIS — R197 Diarrhea, unspecified: Secondary | ICD-10-CM | POA: Diagnosis not present

## 2011-01-27 DIAGNOSIS — I10 Essential (primary) hypertension: Secondary | ICD-10-CM | POA: Diagnosis present

## 2011-01-27 DIAGNOSIS — N319 Neuromuscular dysfunction of bladder, unspecified: Secondary | ICD-10-CM

## 2011-01-27 DIAGNOSIS — K592 Neurogenic bowel, not elsewhere classified: Secondary | ICD-10-CM | POA: Diagnosis present

## 2011-01-27 DIAGNOSIS — R339 Retention of urine, unspecified: Secondary | ICD-10-CM | POA: Diagnosis present

## 2011-01-27 DIAGNOSIS — K5900 Constipation, unspecified: Secondary | ICD-10-CM | POA: Diagnosis present

## 2011-01-27 DIAGNOSIS — Z5189 Encounter for other specified aftercare: Principal | ICD-10-CM

## 2011-01-27 LAB — CBC
HCT: 36.8 % — ABNORMAL LOW (ref 39.0–52.0)
Hemoglobin: 13 g/dL (ref 13.0–17.0)
MCH: 29 pg (ref 26.0–34.0)
MCHC: 35.3 g/dL (ref 30.0–36.0)

## 2011-01-27 LAB — DIFFERENTIAL
Basophils Relative: 0 % (ref 0–1)
Monocytes Absolute: 0.4 10*3/uL (ref 0.1–1.0)
Monocytes Relative: 3 % (ref 3–12)
Neutro Abs: 12.7 10*3/uL — ABNORMAL HIGH (ref 1.7–7.7)

## 2011-01-27 LAB — COMPREHENSIVE METABOLIC PANEL
Albumin: 2.4 g/dL — ABNORMAL LOW (ref 3.5–5.2)
BUN: 32 mg/dL — ABNORMAL HIGH (ref 6–23)
Chloride: 91 mEq/L — ABNORMAL LOW (ref 96–112)
Creatinine, Ser: 0.78 mg/dL (ref 0.50–1.35)
GFR calc Af Amer: >90 mL/min (ref >90)
GFR calc non Af Amer: 86 mL/min — ABNORMAL LOW (ref >90)
Glucose, Bld: 239 mg/dL — ABNORMAL HIGH (ref 70–99)
Total Bilirubin: 0.5 mg/dL (ref 0.3–1.2)

## 2011-01-27 LAB — GLUCOSE, CAPILLARY
Glucose-Capillary: 216 mg/dL — ABNORMAL HIGH (ref 70–99)
Glucose-Capillary: 141 mg/dL — ABNORMAL HIGH (ref 70–99)
Glucose-Capillary: 222 mg/dL — ABNORMAL HIGH (ref 70–99)

## 2011-01-27 LAB — BASIC METABOLIC PANEL
CO2: 26 mEq/L (ref 19–32)
Calcium: 8.5 mg/dL (ref 8.4–10.5)
Creatinine, Ser: 0.83 mg/dL (ref 0.50–1.35)
GFR calc non Af Amer: 84 mL/min — ABNORMAL LOW (ref >90)
Glucose, Bld: 257 mg/dL — ABNORMAL HIGH (ref 70–99)

## 2011-01-27 MED ORDER — POLYETHYLENE GLYCOL 3350 17 G PO PACK
17.0000 g | PACK | Freq: Two times a day (BID) | ORAL | Status: DC
  Administered 2011-01-27 – 2011-02-02 (×8): 17 g via ORAL
  Filled 2011-01-27 (×15): qty 1

## 2011-01-27 MED ORDER — SIMETHICONE 80 MG PO CHEW
80.0000 mg | CHEWABLE_TABLET | Freq: Three times a day (TID) | ORAL | Status: DC
  Administered 2011-01-27 – 2011-02-24 (×112): 80 mg via ORAL
  Filled 2011-01-27 (×121): qty 1

## 2011-01-27 MED ORDER — ACETAMINOPHEN 325 MG PO TABS
325.0000 mg | ORAL_TABLET | ORAL | Status: DC | PRN
  Administered 2011-01-28 – 2011-02-21 (×16): 650 mg via ORAL
  Filled 2011-01-27 (×17): qty 2

## 2011-01-27 MED ORDER — PROMETHAZINE HCL 25 MG/ML IJ SOLN: 12.5000 mg | Freq: Four times a day (QID) | INTRAMUSCULAR | Status: DC | PRN

## 2011-01-27 MED ORDER — DEXAMETHASONE 0.5 MG PO TABS
1.0000 mg | ORAL_TABLET | Freq: Four times a day (QID) | ORAL | Status: DC
  Administered 2011-01-27 – 2011-02-06 (×38): 1 mg via ORAL
  Filled 2011-01-27 (×43): qty 2

## 2011-01-27 MED ORDER — SIMVASTATIN 20 MG PO TABS
20.0000 mg | ORAL_TABLET | Freq: Every day | ORAL | Status: DC
  Administered 2011-01-27 – 2011-02-23 (×28): 20 mg via ORAL
  Filled 2011-01-27 (×29): qty 1

## 2011-01-27 MED ORDER — INSULIN GLARGINE 100 UNIT/ML ~~LOC~~ SOLN
10.0000 [IU] | SUBCUTANEOUS | Status: DC
  Administered 2011-01-28 – 2011-01-29 (×2): 10 [IU] via SUBCUTANEOUS
  Filled 2011-01-27: qty 3

## 2011-01-27 MED ORDER — PANTOPRAZOLE SODIUM 40 MG PO TBEC
40.0000 mg | DELAYED_RELEASE_TABLET | Freq: Two times a day (BID) | ORAL | Status: DC
  Administered 2011-01-28 – 2011-02-24 (×56): 40 mg via ORAL
  Filled 2011-01-27 (×56): qty 1

## 2011-01-27 MED ORDER — METHOCARBAMOL 500 MG PO TABS
500.0000 mg | ORAL_TABLET | Freq: Four times a day (QID) | ORAL | Status: DC | PRN
  Administered 2011-01-29 – 2011-02-10 (×7): 500 mg via ORAL
  Filled 2011-01-27 (×7): qty 1

## 2011-01-27 MED ORDER — DIAZEPAM 5 MG PO TABS
5.0000 mg | ORAL_TABLET | Freq: Four times a day (QID) | ORAL | Status: DC | PRN
  Administered 2011-01-28 – 2011-02-23 (×13): 5 mg via ORAL
  Filled 2011-01-27 (×12): qty 1

## 2011-01-27 MED ORDER — PROMETHAZINE HCL 12.5 MG RE SUPP: 12.5000 mg | Freq: Four times a day (QID) | RECTAL | Status: DC | PRN

## 2011-01-27 MED ORDER — SORBITOL 70 % SOLN
30.0000 mL | Freq: Every day | Status: DC | PRN
  Administered 2011-01-30: 30 mL via ORAL
  Filled 2011-01-27: qty 30

## 2011-01-27 MED ORDER — CITALOPRAM HYDROBROMIDE 10 MG PO TABS
10.0000 mg | ORAL_TABLET | Freq: Every day | ORAL | Status: DC
  Administered 2011-01-27 – 2011-02-24 (×29): 10 mg via ORAL
  Filled 2011-01-27 (×32): qty 1

## 2011-01-27 MED ORDER — DEXAMETHASONE 0.5 MG PO TABS
1.0000 mg | ORAL_TABLET | Freq: Four times a day (QID) | ORAL | Status: DC
  Filled 2011-01-27 (×5): qty 2

## 2011-01-27 MED ORDER — TAMSULOSIN HCL 0.4 MG PO CAPS
0.4000 mg | ORAL_CAPSULE | Freq: Two times a day (BID) | ORAL | Status: DC
  Administered 2011-01-27 – 2011-02-24 (×56): 0.4 mg via ORAL
  Filled 2011-01-27 (×60): qty 1

## 2011-01-27 MED ORDER — BISACODYL 10 MG RE SUPP
10.0000 mg | Freq: Every day | RECTAL | Status: DC | PRN
  Administered 2011-01-30: 10 mg via RECTAL
  Filled 2011-01-27: qty 1

## 2011-01-27 MED ORDER — POLYETHYLENE GLYCOL 3350 17 G PO PACK
17.0000 g | PACK | Freq: Every day | ORAL | Status: DC | PRN
  Filled 2011-01-27: qty 1

## 2011-01-27 MED ORDER — OXYCODONE HCL 5 MG PO TABS
5.0000 mg | ORAL_TABLET | ORAL | Status: DC | PRN
  Administered 2011-01-27 – 2011-02-24 (×48): 5 mg via ORAL
  Filled 2011-01-27 (×53): qty 1

## 2011-01-27 MED ORDER — PROMETHAZINE HCL 12.5 MG PO TABS
12.5000 mg | ORAL_TABLET | Freq: Four times a day (QID) | ORAL | Status: DC | PRN
  Administered 2011-02-17 (×2): 12.5 mg via ORAL
  Filled 2011-01-27 (×2): qty 1

## 2011-01-27 NOTE — Discharge Summary (Signed)
DISCHARGE SUMMARY  SHOOTER TANGEN  MR#: [id number]  DOB:Jul 08, 1933  Date of Admission: 01/08/2011 Date of Discharge: 01/27/2011  Attending Physician:Sadarius Norman  Patient's WUJ:Ricky Bradley, Ricky Robert, RN, Nursing/RN Coord  Consults:Treatment Team:  Marcine Matar  Discharge Diagnoses: #1 cord compression of the upper thoracic vertebrae by epidural hematoma status post discectomy with clot evacuation.(Paraparesis) #2 history of lumbar stenosis with neurogenic claudication. #3 leukocytosis questionable secondary to steroid imagination with possible infection. #4 hypertension #5 history of BPH status post catheterization with gross hematuria #6 GERD #7 dyslipidemia #8 history of irritable bowel syndrome #9 hyperglycemia questionable steroid-induced #10 deconditioning. Present on Admission:  .HTN (hypertension) .Dyslipidemia .BPH (benign prostatic hyperplasia) .GERD (gastroesophageal reflux disease) .Cord compression .Irritable bowel syndrome (IBS)    Current Discharge Medication List    CONTINUE these medications which have NOT CHANGED   Details  aspirin 81 MG EC tablet Take 81 mg by mouth daily. Stop taking before surgery    bethanechol (URECHOLINE) 25 MG tablet Take 25 mg by mouth 2 (two) times daily.      Cetirizine HCl (ZYRTEC PO) Take by mouth daily.     citalopram (CELEXA) 10 MG tablet Take 10 mg by mouth daily.      dicyclomine (BENTYL) 10 MG capsule Take 10 mg by mouth 4 (four) times daily -  before meals and at bedtime.      lovastatin (MEVACOR) 40 MG tablet Take 40 mg by mouth at bedtime.      metoprolol succinate (TOPROL-XL) 25 MG 24 hr tablet Take 12.5 mg by mouth 2 (two) times daily.      NEXIUM 40 MG capsule Take 40 mg by mouth daily before breakfast.     oxyCODONE (OXY IR/ROXICODONE) 5 MG immediate release tablet Take 5 mg by mouth every 4 (four) hours as needed.      Tamsulosin HCl (FLOMAX) 0.4 MG CAPS Take 0.4 mg by mouth 2 (two) times daily.        STOP taking these medications     nitrofurantoin, macrocrystal-monohydrate, (MACROBID) 100 MG capsule           Hospital Course: Patient is a 77 year old Caucasian male with history of lumbar stenosis was admitted to the hospital o. n on 01/08/2011 with history of bilateral lower extremity weakness and this was said to be getting progressively worse. No history of fever no history of urinary or fecal incontinence. MRI of the thoracolumbar done showed epidura hematoma and subsequently patient was admitted for stabilization. Patient was admitted to the neurosurgical floor. He was started on Decadron. For the evaluation of patient was done by the neurosurgeon. Patient had discectomy as well as evacuation of epidural hematoma done. Postoperatively patient was continued on dexamethasone. Pain control was done which IV morphine as well as oxycodone. Patient blood pressure was maintained with Lopressor. Since patient has a history of the BPH with bladder outlet obstruction he was catheterized and also restarted on Flomax. He was also restarted on Zocor for his hyperlipidemia. Since patient was on dexamethasone blood sugar level was maintained insulin sliding scale as well as Lantus insulin. Because of chronic narcotic analgesic use and patient was given Dulcolax for constipation. At the time patient was seen by me he was clinically stable. He had paraparesis. No systemic symptoms. Since patient had leukocytosis, Primaxin was added to patient's regimen. So far patient is clinically stable and the plan is to discharge patient to inpatient rehabilitation. Present on Admission:  .HTN (hypertension) .Dyslipidemia .BPH (benign prostatic hyperplasia) .GERD (gastroesophageal reflux  disease) .Cord compression .Irritable bowel syndrome (IBS):   Day of Discharge BP 146/82  Pulse 74  Temp(Src) 97.9 F (36.6 C) (Oral)  Resp 20  Ht 6' (1.829 m)  Wt 76.6 kg (168 lb 14 oz)  BMI 22.90 kg/m2  SpO2  99%  Physical Exam:vitals as above heent-pallor Neck-neck collar Chest-clear cvs-s1 and s2 abd-soft,non tender,organs not palpable and bowel sounds are present Ext-no pedal edema Neuro-paraparesis Skin-no ecchymoses   Results for orders placed during the hospital encounter of 01/08/11 (from the past 24 hour(s))  GLUCOSE, CAPILLARY     Status: Abnormal   Collection Time   01/26/11 11:17 AM      Component Value Range   Glucose-Capillary 266 (*) 70 - 99 (mg/dL)  GLUCOSE, CAPILLARY     Status: Abnormal   Collection Time   01/26/11  5:13 PM      Component Value Range   Glucose-Capillary 266 (*) 70 - 99 (mg/dL)  GLUCOSE, CAPILLARY     Status: Abnormal   Collection Time   01/26/11  7:59 PM      Component Value Range   Glucose-Capillary 260 (*) 70 - 99 (mg/dL)  BASIC METABOLIC PANEL     Status: Abnormal   Collection Time   01/26/11 11:27 PM      Component Value Range   Sodium 126 (*) 135 - 145 (mEq/L)   Potassium 4.4  3.5 - 5.1 (mEq/L)   Chloride 90 (*) 96 - 112 (mEq/L)   CO2 26  19 - 32 (mEq/L)   Glucose, Bld 257 (*) 70 - 99 (mg/dL)   BUN 30 (*) 6 - 23 (mg/dL)   Creatinine, Ser 1.61  0.50 - 1.35 (mg/dL)   Calcium 8.5  8.4 - 09.6 (mg/dL)   GFR calc non Af Amer 84 (*) >90 (mL/min)   GFR calc Af Amer >90  >90 (mL/min)  CBC     Status: Abnormal   Collection Time   01/27/11  5:11 AM      Component Value Range   WBC 13.5 (*) 4.0 - 10.5 (K/uL)   RBC 4.49  4.22 - 5.81 (MIL/uL)   Hemoglobin 13.0  13.0 - 17.0 (g/dL)   HCT 04.5 (*) 40.9 - 52.0 (%)   MCV 82.0  78.0 - 100.0 (fL)   MCH 29.0  26.0 - 34.0 (pg)   MCHC 35.3  30.0 - 36.0 (g/dL)   RDW 81.1  91.4 - 78.2 (%)   Platelets 127 (*) 150 - 400 (K/uL)  DIFFERENTIAL     Status: Abnormal   Collection Time   01/27/11  5:11 AM      Component Value Range   Neutrophils Relative 94 (*) 43 - 77 (%)   Neutro Abs 12.7 (*) 1.7 - 7.7 (K/uL)   Lymphocytes Relative 3 (*) 12 - 46 (%)   Lymphs Abs 0.4 (*) 0.7 - 4.0 (K/uL)   Monocytes  Relative 3  3 - 12 (%)   Monocytes Absolute 0.4  0.1 - 1.0 (K/uL)   Eosinophils Relative 0  0 - 5 (%)   Eosinophils Absolute 0.0  0.0 - 0.7 (K/uL)   Basophils Relative 0  0 - 1 (%)   Basophils Absolute 0.0  0.0 - 0.1 (K/uL)  COMPREHENSIVE METABOLIC PANEL     Status: Abnormal   Collection Time   01/27/11  5:11 AM      Component Value Range   Sodium 126 (*) 135 - 145 (mEq/L)   Potassium 4.2  3.5 - 5.1 (  mEq/L)   Chloride 91 (*) 96 - 112 (mEq/L)   CO2 28  19 - 32 (mEq/L)   Glucose, Bld 239 (*) 70 - 99 (mg/dL)   BUN 32 (*) 6 - 23 (mg/dL)   Creatinine, Ser 0.10  0.50 - 1.35 (mg/dL)   Calcium 8.4  8.4 - 27.2 (mg/dL)   Total Protein 5.1 (*) 6.0 - 8.3 (g/dL)   Albumin 2.4 (*) 3.5 - 5.2 (g/dL)   AST 11  0 - 37 (U/L)   ALT 17  0 - 53 (U/L)   Alkaline Phosphatase 61  39 - 117 (U/L)   Total Bilirubin 0.5  0.3 - 1.2 (mg/dL)   GFR calc non Af Amer 86 (*) >90 (mL/min)   GFR calc Af Amer >90  >90 (mL/min)  GLUCOSE, CAPILLARY     Status: Abnormal   Collection Time   01/27/11  7:31 AM      Component Value Range   Glucose-Capillary 216 (*) 70 - 99 (mg/dL)   Comment 1 Documented in Chart     Comment 2 Notify RN      Disposition: stable Discharge to Inpatient Rehab   Signed: Horton Ellithorpe 01/27/2011, 11:14 AM

## 2011-01-27 NOTE — Progress Notes (Signed)
PT Cancel Note: Pt D/C'ing to Inpt Rehab  Ricky Bradley, [place] [phone] 01/27/2011

## 2011-01-27 NOTE — Progress Notes (Signed)
Inpatient Diabetes Program Recommendations  AACE/ADA: New Consensus Statement on Inpatient Glycemic Control (2009)  Target Ranges:  Prepandial:   less than 140 mg/dL      Peak postprandial:   less than 180 mg/dL (1-2 hours)      Critically ill patients:  140 - 180 mg/dL   Reason:  Hyperglycemia   Inpatient Diabetes Program Recommendations Insulin - Basal: Increase Lantus to 15 units Correction (SSI): Increase to Resistant scale during steroid therapy Insulin - Meal Coverage: xx HgbA1C: -

## 2011-01-27 NOTE — Progress Notes (Signed)
Patient ID: Ricky Bradley, male   DOB: 01/25/1934, 77 y.o.   MRN: [id number] Subjective:  The patient is alert and pleasant. He complains of bladder fullness. His Foley catheter is not patent.  Objective: Vital signs in last 24 hours: Temp:  [97.4 F (36.3 C)-98 F (36.7 C)] 97.9 F (36.6 C) (12/14 0615) Pulse Rate:  [74-89] 74  (12/14 0615) Resp:  [13-37] 20  (12/14 0615) BP: (114-146)/(70-99) 146/82 mmHg (12/14 0615) SpO2:  [96 %-99 %] 99 % (12/14 0615)  Intake/Output from previous day: 12/13 0701 - 12/14 0700 In: 1700 [P.O.:600; IV Piggyback:200] Out: 1625 [Urine:1625] Intake/Output this shift:    Physical exam the patient is alert and oriented. His motor strength is  without change. He has proximally/and grip strength bilaterally. His gastrocnemius strength is 4+ over 5. The patient's incision is healing well. There is no sign of hematoma or shift.  Lab Results:  Basename 01/27/11 0511 01/26/11 0400  WBC 13.5* 17.5*  HGB 13.0 13.7  HCT 36.8* 38.6*  PLT 127* 159   BMET  Basename 01/27/11 0511 01/26/11 2327  NA 126* 126*  K 4.2 4.4  CL 91* 90*  CO2 28 26  GLUCOSE 239* 257*  BUN 32* 30*  CREATININE 0.78 0.83  CALCIUM 8.4 8.5    Studies/Results: No results found.  Assessment/Plan: Postop day #4: The patient is stable. He is ready to get into the rehabilitation unit. I wean off his Decadron.  Hyponatremia: He is stable I'll plan to repeat his BMP in a few days.  Hyperglycemia: This is likely exacerbated by the Decadron.  Urinary retention: The nurses are working on his Foley. If they can get a patent they will call Dr. Earl Lagos.  LOS: 19 days     Juliene Kirsh D 01/27/2011, 8:07 AM

## 2011-01-27 NOTE — Progress Notes (Signed)
Overall Plan of Care [facility]) Patient Details Name: Ricky Bradley MRN: [id number] DOB: October 15, 1933  Diagnosis:  Tetraplegia, cervical myelopathy    Primary Diagnosis:    Myelopathy Co-morbidities: HTN, GERD, Enlarged prostate, neurogenic bowel and bladder. Functional Problem List  Patient demonstrates impairments in the following areas: Balance, Bladder, Bowel, Endurance, Medication Management, Motor, Pain, Safety, Sensory  and Skin Integrity  Basic ADL's: eating, grooming, bathing, dressing and toileting Advanced ADL's: none  Transfers:  bed mobility, bed to chair, car, furniture and floor Locomotion:  wheelchair mobility  Additional Impairments:  Functional use of upper extremity  Anticipated Outcomes Item Anticipated Outcome  Eating/Swallowing  Mod independent   Basic self-care  Moderate assist  Tolieting  Mod assist   Bowel/Bladder  Mod assist   Transfers  Mod A with sliding board  Locomotion  Min A WC level  Communication    Cognition    Pain  Min assist with meds   3-of 10  Safety/Judgment  Mod independent  Other     Therapy Plan:  OT will 1o-2 x day for 60-90 minutes for 5/7 days of the week.      Team Interventions: Item RN PT OT SLP SW TR Other  Self Care/Advanced ADL Retraining  x x      Neuromuscular Re-Education  x x      Therapeutic Activities  x x      UE/LE Strength Training/ROM  x x      UE/LE Coordination Activities   x      Visual/Perceptual Remediation/Compensation         DME/Adaptive Equipment Instruction  x x      Therapeutic Exercise  x x      Balance/Vestibular Training  x x      Patient/Family Education x x x      Cognitive Remediation/Compensation         Functional Mobility Training  x x      Ambulation/Gait Training  x       Stair Training  x       Wheelchair Propulsion/Positioning  x xx      Control and instrumentation engineer  x       Community Reintegration  x       Dysphagia/Aspiration Chemical engineer         Bladder Management x        Bowel Management x        Disease Management/Prevention x x       Pain Management x x       Medication Management x        Skin Care/Wound Management x x x      Splinting/Orthotics x x x      Discharge Planning  x x      Psychosocial Support  x x                         Team Discharge Planning: Destination:  Home Projected Follow-up:  PT, OT Projected Equipment Needs:  Cushion and Wheelchair, dropped arm commode chair, ?tub transfer bench, AE Patient/family involved in discharge planning:  Yes  MD ELOS: 4wks Medical Rehab Prognosis:  Good Assessment: Patient is admitted for CIR therapies.  He will working with the rehab md, rehab rn, pt, and ot to improve functional mobility, self-care, pain control, safety, patient and caregiver ed, bowel and bladder continence.  Therapies will work  with the patient at least 3 hours per day at least 5 days a week with goals at a moderate assistance level except for w/c mobility which will be at min assist.  The patient and care givers will have to learn a bladder and bowel regimen given his current diagnosis and neurological deficits.  Patient and family appear motivated to participate and work with the team.

## 2011-01-27 NOTE — Progress Notes (Signed)
Patient admitted at approximately 1450. Alert and oriented x4 denies pain. c-collar intact anterior neck incision with steristrips intact . Bilateral groin moist red . Cleaned with soap and water and dried. Barrier cream applied . Redness noted to buttocks and lt heel . Bilateral heels soft red. Elevated on pillows . Patient incontinent of moderate amt soft unformed stool .  Oriented patient to room and call bell system . Patient verbalized understanding of admission process . Continue with plan of care.                                                                     Ricky Bradley

## 2011-01-27 NOTE — Progress Notes (Signed)
I plan to admit patient to inpatient acute rehabilitation today. Pager [phone]. Patient and son at bedside and are aware and in agreement.

## 2011-01-27 NOTE — Progress Notes (Signed)
   CARE MANAGEMENT NOTE 01/27/2011  Patient:  Ricky Bradley, Ricky Bradley   Account Number:  [id number]  Date Initiated:  01/09/2011  Documentation initiated by:  Junius Creamer  Subjective/Objective Assessment:   adm w weakness     Action/Plan:   lives w wife, pcp dr Domenick Bookbinder   Anticipated DC Date:  01/11/2011   Anticipated DC Plan:  HOME W HOME HEALTH SERVICES      DC Planning Services  CM consult      Choice offered to / List presented to:             Status of service:  In process, will continue to follow Medicare Important Message given?   (If response is "NO", the following Medicare IM given date fields will be blank) Date Medicare IM given:   Date Additional Medicare IM given:    Discharge Disposition:    Per UR Regulation:  Reviewed for med. necessity/level of care/duration of stay  Comments:  01/27/2011 Onnie Boer, RN ,BSN 1528 PT IS TO DC TO CIR TODAY  01/20/2011 Onnie Boer, RN, BSN 1645 PT CONT WITH Q 4 HR BLADDER IRRIGATIONS, PT SHOULD DC TO CIR ON [employer]   01/17/2011 1530 Utilization review complete. Isidoro Donning RN CCM Case Mgmt phone [phone]   01/12/11 1330 Verdis Prime RN MSN CCM Pt scheduled for surgery on Monday, 12/3.  11/26 debbie dowell rn,bsn [phone]

## 2011-01-27 NOTE — Progress Notes (Signed)
OT Cancellation Note  Treatment cancelled today due to:  Spoke with Inpatient Rehab nurse coordinator who states patient to d/c to CIR today.   Glendale Chard    OTR/L Pager: [phone] 01/27/2011

## 2011-01-28 DIAGNOSIS — N319 Neuromuscular dysfunction of bladder, unspecified: Secondary | ICD-10-CM

## 2011-01-28 DIAGNOSIS — Z5189 Encounter for other specified aftercare: Secondary | ICD-10-CM

## 2011-01-28 DIAGNOSIS — G8250 Quadriplegia, unspecified: Secondary | ICD-10-CM

## 2011-01-28 DIAGNOSIS — K592 Neurogenic bowel, not elsewhere classified: Secondary | ICD-10-CM

## 2011-01-28 DIAGNOSIS — M4712 Other spondylosis with myelopathy, cervical region: Secondary | ICD-10-CM

## 2011-01-28 LAB — GLUCOSE, CAPILLARY: Glucose-Capillary: 224 mg/dL — ABNORMAL HIGH (ref 70–99)

## 2011-01-28 NOTE — Plan of Care (Signed)
Problem: RH PAIN MANAGEMENT Goal: RH STG PAIN MANAGED AT OR BELOW PT'S PAIN GOAL Pain goal <3 on scale of 0-10

## 2011-01-28 NOTE — Progress Notes (Signed)
Occupational Therapy Note  Patient Details  Name: Ricky Bradley MRN: [id number] Date of Birth: 20-Jan-1934 Today's Date: 01/28/2011 Pain:  None Time:  3:30-17: 00 Engaged in bed mobility, sitting EOB, education on pressure relief, education on positioning in bed for skin integrity.  \ Pt. Needs reinforcement with education tips.  Daughter present during session.  Pt was incontinent of stool.    Individual Treatment Humberto Seals 01/28/2011, 5:15 PM

## 2011-01-28 NOTE — Progress Notes (Signed)
Pt alert/oriented, confused at times, bed alarm on and quick release when up OOB , incision with steri strips to anterior left neck, aspen collar on at all times, limited ROM to bilateral upper extremities, able to wiggle toes and move fingers, set up for meals to open containers, heels red, elevate up to pillows and turn Q 2  while in bed, groin/preineal area with red, using micro guard powder, will continue with plan of care

## 2011-01-28 NOTE — Progress Notes (Signed)
Physical Therapy Assessment and Plan  Patient Details  Name: Ricky Bradley MRN: [id number] Date of Birth: [birth year]  PT Diagnosis: Abnormal posture, Impaired sensation and Quadriplegia Rehab Potential: Good ELOS: 4 weeks   Today's Date: 01/28/2011 Time: 8:15-9:15:   Assessment & Plan Clinical Impression: 77 year old male admitted November 25 with bilateral lower weakness progressive in nature. Patient with similar episodes in May 2012 when he was seen by neurosurgery and underwent lumbar laminectomy for spinal stenosis. MRI of cervical spine demonstrated severe stenosis at cervical C6 and C7 with myelopathy. Patient underwent cervical C6-7 anterior cervical discectomy with decompression on December 3 per Dr. Tressie Stalker of neurosurgery.Fitted with cervical collar. Postoperatively with urinary retention and hematuria with noted history of benign prostatic hypertrophy. He was seen in consultation by urology services Dr. Retta Diones with Foley catheter inserted until mobility improved and then plan voiding trial.Placed on keflex 12/7 for empiric coverage of suspect UTI with lattest urine study showing 85,000 staph coagulase negative. Physical and occupational therapies with total assist for transfer and gait of 2 feet as of 12/7 Physical medicine and rehabilitation were consulted for consideration of inpatient rehabilitation services. Pt transferred to CIR on 01/27/11.  Patient currently requires total with mobility secondary to impaired sensation, unbalanced muscle activation and tetraplegia. Prior to hospitalization, patient was independent with mobility and lived with Spouse in a House. Home access is 3Stairs to enter.  Patient will benefit from skilled PT intervention to maximize safe functional mobility, minimize fall risk and decrease caregiver burden for planned discharge home with 24 hour assist. Anticipate patient will benefit from follow up [facility] at discharge.  PT - End of Session Activity  Tolerance: Tolerates 30+ min activity with multiple rests Endurance Deficit: Yes Endurance Deficit Description: Fatigued from bed mobility PT Assessment Rehab Potential: Good Barriers to Discharge: Inaccessible home environment;Decreased caregiver support (Pt states wife is not physically able to help him) PT Plan PT Frequency: 1-2 X/day, 60-90 minutes Estimated Length of Stay: 4 weeks  PT Treatment/Interventions: Ambulation/gait training;Balance/vestibular training;Cognitive remediation/compensation;Community reintegration;DME/adaptive equipment instruction;Functional electrical stimulation;Neuromuscular re-education;Functional mobility training;Pain management;Patient/family education;Splinting/orthotics;Stair training;Therapeutic Activities;Therapeutic Exercise;UE/LE Coordination activities;UE/LE Strength taining/ROM;Wheelchair propulsion/positioning PT Recommendation Recommendations for Other Services: Speech consult (Pt had coughing spell after breakfast ) Follow Up Recommendations:  (TBD) Equipment Recommended: Wheelchair cushion (measurements);Wheelchair (measurements)  Precautions/Restrictions Precautions Precautions: Fall Precaution Comments: cervical precautions Required Braces or Orthoses: Yes Cervical Brace: Hard collar;Applied in supine position Restrictions Weight Bearing Restrictions: No General @[id number] [phone]) Vital Signs Therapy Vitals Temp: 97.8 F (36.6 C) Temp src: Oral Pulse Rate: 89  Resp: 20  BP: 105/74 mmHg Patient Position, if appropriate: Lying Oxygen Therapy SpO2: 100 % O2 Device: None (Room air) Pain: 7/10 thoracic region, repositioned and modified activity   Home Living/Prior Functioning Home Living Lives With: Spouse Receives Help From: Family;Other (Comment) (son lives down the street) Type of Home: House Home Layout: One level Home Access: Stairs to enter Entrance Stairs-Rails: Left Entrance Stairs-Number of Steps: 3 Bathroom  Shower/Tub: Engineer, manufacturing systems: Standard Bathroom Accessibility: Yes How Accessible:  (Pt uncertain) Home Adaptive Equipment: Bedside commode/3-in-1;Walker - rolling;Tub transfer bench;Straight cane Prior Function Level of Independence: Independent with basic ADLs;Independent with homemaking with ambulation;Requires assistive device for independence;Independent with transfers Able to Take Stairs?: Yes 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, clinical) Vision/Perception  Vision - History Baseline Vision: No visual deficits Patient Visual Report: No change from baseline Praxis Praxis: Intact  Cognition Overall Cognitive Status: Appears within functional limits for tasks assessed Arousal/Alertness: Awake/alert Orientation Level: Oriented X4  Attention: Sustained Sustained Attention: Appears intact Memory: Appears intact Awareness: Appears intact Problem Solving: Appears intact Safety/Judgment: Appears intact Sensation Sensation Light Touch: Impaired by gross assessment (No sensation from distal LE to mid-trunk - no deep pressure) Proprioception: Impaired by gross assessment Coordination Gross Motor Movements are Fluid and Coordinated: No Fine Motor Movements are Fluid and Coordinated: No Coordination and Movement Description: No active movement noted in LEs, but pt was able to sense sitting position at EOB, difficulty correcting Motor  Motor Motor: Tetraplegia  Mobility Bed Mobility Bed Mobility: Yes Rolling Right: 2: Max assist Rolling Right Details: Tactile cues for sequencing;Verbal cues for sequencing;Verbal cues for technique;Verbal cues for precautions/safety;Verbal cues for safe use of DME/AE;Manual facilitation for weight shifting;Manual facilitation for placement Rolling Left: 2: Max assist Left Sidelying to Sit: 1: +1 Total assist;HOB flat Left Sidelying to Sit Details: Tactile cues for weight shifting;Tactile cues for placement;Verbal cues for  sequencing;Verbal cues for technique;Verbal cues for precautions/safety;Verbal cues for safe use of DME/AE;Manual facilitation for weight shifting;Manual facilitation for placement Sitting - Scoot to Edge of Bed: 2: Max assist Sit to Supine - Right: 1: +2 Total assist;HOB flat Sit to Supine - Right Details: Verbal cues for technique;Verbal cues for sequencing;Manual facilitation for weight shifting;Manual facilitation for placement Scooting to HOB: 1: +2 Total assist Transfers Transfers: Yes Sit to Stand: 1: +2 Total assist;With upper extremity assist;From bed Sit to Stand Details: Verbal cues for technique;Manual facilitation for weight shifting;Manual facilitation for placement Stand to Sit: 1: +2 Total assist;With upper extremity assist;To bed Stand to Sit Details (indicate cue type and reason): Verbal cues for sequencing;Verbal cues for technique (manual facil to lower) Locomotion  Ambulation Ambulation/Gait Assistance: Not tested (comment) (Pt pre-gait at this time) Stairs / Additional Locomotion Stairs: No Wheelchair Mobility Wheelchair Mobility: No Distance: 50  Trunk/Postural Assessment  Cervical Assessment Cervical Assessment:  (Forward head posture, in collar) Cervical AROM Overall Cervical AROM Comments: Pt currently in soft cervical collar.  Unable to perform any AROM secondary to MD precautions. Thoracic Assessment Thoracic Assessment: Within Functional Limits Lumbar Assessment Lumbar Assessment: Within Functional Limits Postural Control Postural Control: Deficits on evaluation Postural Limitations: Pt tends to lean posteriorly and L, requires up to Max A sitting, progressing to Min A with bil UE support  Balance Balance Balance Assessed: No Static Sitting Balance Static Sitting - Balance Support: Bilateral upper extremity supported Static Sitting - Level of Assistance: 2: Max assist Extremity Assessment  RUE Assessment RUE Assessment: Exceptions to [facility]  (Antigravity motion noted, but grossly 3+/5 throughout,) RUE Strength Gross Grasp: Impaired LUE Assessment LUE Assessment: Exceptions to [facility] (Antigravity motion noted, but grossly 3+/5 throughout,) RLE Assessment RLE Assessment: Exceptions to [facility] RLE Strength RLE Overall Strength: Deficits RLE Overall Strength Comments: No active motion noted in anti-gravity positions LLE Assessment LLE Assessment: Exceptions to [facility] LLE Strength LLE Overall Strength: Deficits LLE Overall Strength Comments: No active motion noted in anti-gravity positions, except extensor halicus on L great toe.   Recommendations for other services: Other: Speech  Discharge Criteria: Patient will be discharged from PT if patient refuses treatment 3 consecutive times without medical reason, if treatment goals not met, if there is a change in medical status, if patient makes no progress towards goals or if patient is discharged from hospital.  The above assessment, treatment plan, treatment alternatives and goals were discussed and mutually agreed upon: by patient and by family  Iona Coach, PT 01/28/2011, 4:47 PM

## 2011-01-28 NOTE — Progress Notes (Signed)
Occupational Therapy Assessment and Plan  Patient Details  Name: Ricky Bradley MRN: [id number] Date of Birth: 1933/09/19  OT Diagnosis: acute pain and quadriparesis at level C6-7 Rehab Potential:  good ELOS:   4 weeks  Today's Date: 01/28/2011 Time:  -  1100-1200    Assessment & Plan Clinical Impression: Patient is a 77 y.o. year old male with recent admission to the hospital on Dec 3 with stenosis at C 6-7.  Patient transferred to CIR on 01/27/2011 .  Patient's past medical history is significant for HTN, GERD, CHOL ELEVATION, ENLARGED PROSTATE.    Patient currently requires total with basic self-care skills secondary to unbalanced muscle activation.  Prior to hospitalization, patient could complete BADL with supervision.  Patient will benefit from skilled intervention to increase independence with basic self-care skills prior to discharge home with care partner.  Anticipate patient will require 24 hour supervision and follow up home health.  OT - End of Session Endurance Deficit: Yes Endurance Deficit Description: Fatigued from bed mobility OT Recommendation Equipment Recommended: Wheelchair cushion (measurements);Wheelchair (measurements)  Precautions/Restrictions  Precautions Precautions: Fall Precaution Comments: cervical precautions Required Braces or Orthoses: Yes Cervical Brace: Hard collar;Applied in supine position Restrictions Weight Bearing Restrictions: No     Home Living/Prior Functioning Home Living Lives With: Spouse Receives Help From: Family;Other (Comment) (son lives down the street) Type of Home: House Home Layout: One level Home Access: Stairs to enter Entrance Stairs-Rails: Left Entrance Stairs-Number of Steps: 3 Bathroom Shower/Tub: Engineer, manufacturing systems: Standard Bathroom Accessibility: Yes How Accessible:  (Pt uncertain) Home Adaptive Equipment: Bedside commode/3-in-1;Walker - rolling;Tub transfer bench;Straight cane IADL  History Mode of Transportation: Car Occupation: Retired Prior Function Level of Independence: Independent with basic ADLs;Independent with homemaking with ambulation;Requires assistive device for independence;Independent with transfers Able to Take Stairs?: Yes 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, clinical) ADL   Vision/Perception  Vision - History Baseline Vision: No visual deficits Patient Visual Report: No change from baseline Praxis Praxis: Intact  Cognition Overall Cognitive Status: Appears within functional limits for tasks assessed Arousal/Alertness: Awake/alert Orientation Level: Oriented X4 Attention: Sustained Sustained Attention: Appears intact Memory: Appears intact Awareness: Appears intact Problem Solving: Appears intact Safety/Judgment: Appears intact Sensation Sensation Light Touch: Impaired by gross assessment (No sensation from distal LE to mid-trunk - no deep pressure) Proprioception: Impaired by gross assessment Coordination Gross Motor Movements are Fluid and Coordinated: No Fine Motor Movements are Fluid and Coordinated: No Coordination and Movement Description: No active movement noted in LEs, but pt was able to sense sitting position at EOB, difficulty correcting Motor  Motor Motor: Tetraplegia Mobility  Bed Mobility Bed Mobility: Yes Rolling Right: 2: Max assist Rolling Right Details: Tactile cues for sequencing;Verbal cues for sequencing;Verbal cues for technique;Verbal cues for precautions/safety;Verbal cues for safe use of DME/AE;Manual facilitation for weight shifting;Manual facilitation for placement Rolling Left: 2: Max assist Left Sidelying to Sit: 1: +1 Total assist;HOB flat Left Sidelying to Sit Details: Tactile cues for weight shifting;Tactile cues for placement;Verbal cues for sequencing;Verbal cues for technique;Verbal cues for precautions/safety;Verbal cues for safe use of DME/AE;Manual facilitation for weight shifting;Manual  facilitation for placement Sitting - Scoot to Edge of Bed: 2: Max assist Sit to Supine - Right: 1: +2 Total assist;HOB flat Sit to Supine - Right Details: Verbal cues for technique;Verbal cues for sequencing;Manual facilitation for weight shifting;Manual facilitation for placement Scooting to HOB: 1: +2 Total assist Transfers Transfers: Yes Sit to Stand: 1: +2 Total assist;With upper extremity assist;From bed Sit to Stand  Details: Verbal cues for technique;Manual facilitation for weight shifting;Manual facilitation for placement Stand to Sit: 1: +2 Total assist;With upper extremity assist;To bed Stand to Sit Details (indicate cue type and reason): Verbal cues for sequencing;Verbal cues for technique (manual facil to lower)  Trunk/Postural Assessment  Cervical Assessment Cervical Assessment:  (Forward head posture, in collar) Cervical AROM Overall Cervical AROM Comments: Pt currently in soft cervical collar.  Unable to perform any AROM secondary to MD precautions. Thoracic Assessment Thoracic Assessment: Within Functional Limits Lumbar Assessment Lumbar Assessment: Within Functional Limits Postural Control Postural Control: Deficits on evaluation Postural Limitations: Pt tends to lean posteriorly and L, requires up to Max A sitting, progressing to Min A with bil UE support  Balance Balance Balance Assessed: No Static Sitting Balance Static Sitting - Balance Support: Bilateral upper extremity supported Static Sitting - Level of Assistance: 2: Max assist Extremity/Trunk Assessment RUE Assessment RUE Assessment: Exceptions to [facility] (Antigravity motion noted, but grossly 3+/5 throughout,) RUE Strength Gross Grasp: Impaired LUE Assessment LUE Assessment: Exceptions to [facility] (Antigravity motion noted, but grossly 3+/5 throughout,)  Recommendations for other services: None  Discharge Criteria: Patient will be discharged from OT if patient refuses treatment 3 consecutive times without  medical reason, if treatment goals not met, if there is a change in medical status, if patient makes no progress towards goals or if patient is discharged from hospital.  The above assessment, treatment plan, treatment alternatives and goals were discussed and mutually agreed upon: by family  Humberto Seals 01/28/2011, 4:52 PM

## 2011-01-28 NOTE — Progress Notes (Signed)
Patient ID: Ricky Bradley, male   DOB: 1933-09-26, 77 y.o.   MRN: [id number] Subjective/Complaints: Slept poor due to back pain Review of Systems  HENT: Positive for neck pain.   Musculoskeletal: Positive for myalgias and back pain.       Couldn't find a comfortable position in bed  All other systems reviewed and are negative.   Objective: Vital Signs: Blood pressure 133/77, pulse 70, temperature 97.8 F (36.6 C), temperature source Oral, resp. rate 18, SpO2 99.00%. No results found. Results for orders placed during the hospital encounter of 01/27/11 (from the past 72 hour(s))  GLUCOSE, CAPILLARY     Status: Abnormal   Collection Time   01/27/11  5:31 PM      Component Value Range Comment   Glucose-Capillary 141 (*) 70 - 99 (mg/dL)    Comment 1 Notify RN     GLUCOSE, CAPILLARY     Status: Abnormal   Collection Time   01/27/11  8:25 PM      Component Value Range Comment   Glucose-Capillary 222 (*) 70 - 99 (mg/dL)    Comment 1 Notify RN     GLUCOSE, CAPILLARY     Status: Abnormal   Collection Time   01/28/11  7:38 AM      Component Value Range Comment   Glucose-Capillary 170 (*) 70 - 99 (mg/dL)    Comment 1 Notify RN         Assessment/Plan: 1. Functional deficits secondary to Cervical myelopathy causing tetraplegia C6 ASIA B which require 3+ hours per day of interdisciplinary therapy in a comprehensive inpatient rehab setting. Physiatrist is providing close team supervision and 24 hour management of active medical problems listed below. Physiatrist and rehab team continue to assess barriers to discharge/monitor patient progress toward functional and medical goals. Mobility:          ADL:    Cognition: Cognition Orientation Level: Oriented X4 Cognition Orientation Level: Oriented X4  2. Anticoagulation/DVT prophylaxis with SCDs 3. Pain Management: Oxycodone and Tylenol  Medical problem list  1.Cervical Myelopathy-S/Pcervical diskectomy with decompression  12/3.Cervical collar at all times.Decadron taper as directed. Will discuss clinical presentation with NS as he is weaker than when I examined him during my consult.  2. DVT Prophylaxis/Anticoagulation: SCD/support hose.No current signs of DVT. Needs lower ext dopplers  3. Pain Management: Oxycodone/robaxin.Monitor with increased activity. No pain currently at rest.  4. Neurogenic bladder and hematuria/BPH-.Foley tube in place with hematuria resolving.Continue flomax for now.Empiric keflex 12/7 and plan follow up urine study.Will discuss voiding trial with urology services.  5.Steriod induced hyperglycemia-check blood sugars ac/hs and monitor as taper steroids.  6.Depression-celexa daily.Provide emotional support.  7.GERD-protonix  8.Hyperlipidemia-zocor  1  Ricky Bradley 01/28/2011, 8:10 AM

## 2011-01-29 LAB — GLUCOSE, CAPILLARY
Glucose-Capillary: 191 mg/dL — ABNORMAL HIGH (ref 70–99)
Glucose-Capillary: 184 mg/dL — ABNORMAL HIGH (ref 70–99)
Glucose-Capillary: 263 mg/dL — ABNORMAL HIGH (ref 70–99)

## 2011-01-29 MED ORDER — INSULIN GLARGINE 100 UNIT/ML ~~LOC~~ SOLN
15.0000 [IU] | SUBCUTANEOUS | Status: DC
  Administered 2011-01-29 – 2011-02-02 (×5): 15 [IU] via SUBCUTANEOUS

## 2011-01-29 MED ORDER — INSULIN GLARGINE 100 UNIT/ML ~~LOC~~ SOLN
5.0000 [IU] | Freq: Once | SUBCUTANEOUS | Status: AC
  Administered 2011-01-29: 5 [IU] via SUBCUTANEOUS

## 2011-01-29 NOTE — Progress Notes (Signed)
Patient ID: Ricky Bradley, male   DOB: [birth year], 77 y.o.   MRN: [id number] Subjective/Complaints: Slept poor due to back pain Review of Systems  HENT: Positive for neck pain.   Musculoskeletal: Positive for myalgias and back pain.       Couldn't find a comfortable position in bed  All other systems reviewed and are negative.   Objective: Vital Signs: Blood pressure 126/72, pulse 71, temperature 97.7 F (36.5 C), temperature source Oral, resp. rate 19, SpO2 100.00%. No results found. Results for orders placed during the hospital encounter of 01/27/11 (from the past 72 hour(s))  GLUCOSE, CAPILLARY     Status: Abnormal   Collection Time   01/27/11  5:31 PM      Component Value Range Comment   Glucose-Capillary 141 (*) 70 - 99 (mg/dL)    Comment 1 Notify RN     GLUCOSE, CAPILLARY     Status: Abnormal   Collection Time   01/27/11  8:25 PM      Component Value Range Comment   Glucose-Capillary 222 (*) 70 - 99 (mg/dL)    Comment 1 Notify RN     GLUCOSE, CAPILLARY     Status: Abnormal   Collection Time   01/28/11  7:38 AM      Component Value Range Comment   Glucose-Capillary 170 (*) 70 - 99 (mg/dL)    Comment 1 Notify RN     GLUCOSE, CAPILLARY     Status: Abnormal   Collection Time   01/28/11 11:57 AM      Component Value Range Comment   Glucose-Capillary 193 (*) 70 - 99 (mg/dL)    Comment 1 Notify RN     GLUCOSE, CAPILLARY     Status: Abnormal   Collection Time   01/28/11  4:20 PM      Component Value Range Comment   Glucose-Capillary 209 (*) 70 - 99 (mg/dL)    Comment 1 Notify RN     GLUCOSE, CAPILLARY     Status: Abnormal   Collection Time   01/28/11 10:33 PM      Component Value Range Comment   Glucose-Capillary 224 (*) 70 - 99 (mg/dL)   GLUCOSE, CAPILLARY     Status: Abnormal   Collection Time   01/29/11  7:27 AM      Component Value Range Comment   Glucose-Capillary 191 (*) 70 - 99 (mg/dL)    Comment 1 Notify RN      Physical Exam  Constitutional: He is  oriented to person, place, and time. He appears well-developed.  HENT:  Head: Normocephalic.  Neck:  Collar in place  Cardiovascular: Normal rate.  Pulmonary/Chest: Breath sounds normal. He has no wheezes.  Abdominal: He exhibits no distension. There is no tenderness.  Musculoskeletal: He exhibits no edema.  Neurological: He is alert and oriented to person, place, and time. A sensory deficit is present. Coordination reduced in BUEs Patient with 4/5 strength in both upper extremities grossly except 3-/5 grip. He had some diminishment of his fine touch in the hands right more than left. In the lower extremities strength was tr/5 proximally at the right hip flexors 1-tr at the right knee extensors and flexors and to tr-1/5 right at the ankle dorsiflexors and plantar flexors. Left leg 1-1+/5 generally. He had some diminishment of his leg sensation at 1 plus out of 2. Fine motor coordination of the upper extremities was fair. Cognitively he was alert and oriented x 3 with extra time, but was slow  to process . CN exam unremarkable 2-12. DTR's 3+ in lower ext's and resting tone at knees and ankles of 1+ /4.  Skin: Skin is warm and dry and intact. C-collar fitting appropriately. Psychiatric: Affect was flat but he was generally pleasant.    Assessment/Plan: 1. Functional deficits secondary to Cervical myelopathy causing tetraplegia C7 ASIA B which require 3+ hours per day of interdisciplinary therapy in a comprehensive inpatient rehab setting. Physiatrist is providing close team supervision and 24 hour management of active medical problems listed below. Physiatrist and rehab team continue to assess barriers to discharge/monitor patient progress toward functional and medical goals. Mobility: Bed Mobility Bed Mobility: Yes Rolling Right: 2: Max assist Rolling Right Details (indicate cue type and reason): Cues for UE use, A for bil LEs Rolling Left: 2: Max assist Rolling Left Details (indicate cue  type and reason): Cues for UE use, A for bil LEs Left Sidelying to Sit: 1: +1 Total assist;HOB flat Left Sidelying to Sit Details (indicate cue type and reason): Pt able to A with bil UEs, but A needed for trunk and LEs Sitting - Scoot to Edge of Bed: 2: Max assist Sitting - Scoot to [place] of Bed Details (indicate cue type and reason): Cues for hand placement and timing, A for anterior translation at hips Sit to Supine - Right: 1: +2 Total assist;HOB flat Scooting to HOB: 1: +2 Total assist Scooting to [facility] Details (indicate cue type and reason): A to position LEs in hooklying and cues to lift hips. A for scooting Transfers Transfers: Yes Sit to Stand: 1: +2 Total assist;With upper extremity assist;From bed Sit to Stand Details (indicate cue type and reason): A needed for lifting and lowering. Pt unable to clear hps from bed without +2 A. Tried Sara Plus, but increased thoracic pain, so stopped.  Stand to Sit: 1: +2 Total assist;With upper extremity assist;To bed Ambulation/Gait Ambulation/Gait Assistance: Not tested (comment) (Pt pre-gait at this time) Stairs: No Wheelchair Mobility Wheelchair Mobility: No Distance: 40  ADL:    Cognition: Cognition Overall Cognitive Status: Appears within functional limits for tasks assessed Arousal/Alertness: Awake/alert Orientation Level: Oriented X4 Attention: Sustained Sustained Attention: Appears intact Memory: Appears intact Awareness: Appears intact Problem Solving: Appears intact Safety/Judgment: Appears intact Cognition Arousal/Alertness: Awake/alert Orientation Level: Oriented X4  2. Anticoagulation/DVT prophylaxis with SCDs 3. Pain Management: Oxycodone and Tylenol  Medical problem list  1.Cervical Myelopathy-S/Pcervical diskectomy with decompression 12/3.Cervical collar at all times.Decadron taper as directed. Will discuss clinical presentation with NS as he is weaker than when I examined him during my consult.  2. DVT  Prophylaxis/Anticoagulation: SCD/support hose.No current signs of DVT. Needs lower ext dopplers  3. Pain Management: Oxycodone/robaxin.Monitor with increased activity. No pain currently at rest.  4. Neurogenic bladder and hematuria/BPH-.Foley tube in place with hematuria resolving.Continue flomax for now.Empiric keflex 12/7 and plan follow up urine study.Will discuss voiding trial with urology services.  5.Steriod induced hyperglycemia-check blood sugars ac/hs and monitor as taper steroids.  6.Depression-celexa daily.Provide emotional support.  7.GERD-protonix  8.Hyperlipidemia-zocor  2  KIRSTEINS,ANDREW E 01/29/2011, 8:13 AM

## 2011-01-29 NOTE — Progress Notes (Signed)
Physical Therapy Session Note  Patient Details  Name: Ricky Bradley MRN: [id number] Date of Birth: 02/20/1933  Today's Date: 01/28/2011 Time: 7829-5621 Time Calculation (min): 42 min  Precautions: Precautions Precautions: Fall Precaution Comments: cervical precautions Required Braces or Orthoses: Yes Cervical Brace: Hard collar;Applied in supine position Restrictions Weight Bearing Restrictions: No  Short Term Goals: PT Short Term Goal 1: Perform all bed mobility Max A  PT Short Term Goal 1 - Progress: Progressing toward goal PT Short Term Goal 2: Perform all functional transfers Max A  PT Short Term Goal 2 - Progress: Progressing toward goal PT Short Term Goal 3: Maintain static sitting balance with Min PT Short Term Goal 3 - Progress: Progressing toward goal PT Short Term Goal 4: WC mobility Mod A x 50' PT Short Term Goal 4 - Progress: Progressing toward goal  Skilled Therapeutic Interventions/Progress Updates:  Tx focused on sliding board transfers, WC mobility, and static/dynamic sitting balance. Instructed pt in bed<>WC sliding board transfers, requiring +2 total A for placing board, positioning pt, and transitioning. Pt given cues for head/hips relationship and hand placement. Pt propelled reclining WC 2x40' with Max A in controlled environment with cues for increasing stroke length, turns. A for parts management. Sitting balance EOB and unsupported back in WC with reaching in/outside BOS with Max A for recovery. Pts hips tend to shift R, head/trunk L. Pt with collar on at all times. Sit<>Supine with Total A.  Pt continues to have upper thoracic pain 7/10, repositioned.      Therapy/Group: Individual Therapy  Iona Coach, PT 01/29/2011, 7:47 AM

## 2011-01-29 NOTE — Progress Notes (Signed)
Physical Therapy Session Note  Patient Details  Name: EON ZUNKER MRN: [id number] Date of Birth: 07-Jan-1934  Today's Date: 01/29/2011 Time: 8:15-9:05  Precautions: Precautions Precautions: Fall Precaution Comments: cervical precautions Required Braces or Orthoses: Yes Cervical Brace: Hard collar;Applied in supine position Restrictions Weight Bearing Restrictions: No  Short Term Goals: PT Short Term Goal 1: Perform all bed mobility Max A  PT Short Term Goal 1 - Progress: Progressing toward goal PT Short Term Goal 2: Perform all functional transfers Max A  PT Short Term Goal 2 - Progress: Progressing toward goal PT Short Term Goal 3: Maintain static sitting balance with Min PT Short Term Goal 3 - Progress: Progressing toward goal PT Short Term Goal 5: WC mobility Mod A x 50' PT Short Term Goal 5 - Progress: Progressing toward goal  Skilled Therapeutic Interventions/Progress Updates:  Tx focused on bed mobility, activity tolerance, and sitting balance. Supine AAROM ankle DF/PF 2x10 bil with trace PF bil noted.  Rolling R/L with Mod A for LEs and hips, pt able to A with UEs using rail x4 each direction with cues for sequence and technique. Discussed use of leg loops at later time. Pt needed rests due to fatigue.  L side<>sit x2 with Total A for bil LEs and trunk, cues for UE placement and use, as well as timing and sequence. Sat EOB 2x73min with bil UE support and Max, progressing to CGA. Pt unable to balance without bil UE support. Cues for posture. Total A x2 for scooting up in bed.  Daughter arrived end of tx and discussed PT POC and DC disposition, liklihood of needing WC at DC and      Therapy/Group: Individual Therapy  Iona Coach 01/29/2011, 8:47 AM

## 2011-01-30 DIAGNOSIS — Z5189 Encounter for other specified aftercare: Secondary | ICD-10-CM

## 2011-01-30 DIAGNOSIS — K592 Neurogenic bowel, not elsewhere classified: Secondary | ICD-10-CM

## 2011-01-30 DIAGNOSIS — G8250 Quadriplegia, unspecified: Secondary | ICD-10-CM

## 2011-01-30 DIAGNOSIS — M4712 Other spondylosis with myelopathy, cervical region: Secondary | ICD-10-CM

## 2011-01-30 DIAGNOSIS — N319 Neuromuscular dysfunction of bladder, unspecified: Secondary | ICD-10-CM

## 2011-01-30 LAB — CBC
HCT: 40.4 % (ref 39.0–52.0)
MCH: 28.9 pg (ref 26.0–34.0)
MCV: 84.5 fL (ref 78.0–100.0)
Platelets: 80 10*3/uL — ABNORMAL LOW (ref 150–400)
RBC: 4.78 MIL/uL (ref 4.22–5.81)

## 2011-01-30 LAB — COMPREHENSIVE METABOLIC PANEL
AST: 10 U/L (ref 0–37)
BUN: 19 mg/dL (ref 6–23)
CO2: 28 mEq/L (ref 19–32)
Calcium: 8.4 mg/dL (ref 8.4–10.5)
Chloride: 96 mEq/L (ref 96–112)
Creatinine, Ser: 0.6 mg/dL (ref 0.50–1.35)
GFR calc Af Amer: >90 mL/min (ref >90)
GFR calc non Af Amer: >90 mL/min (ref >90)
Total Bilirubin: 0.5 mg/dL (ref 0.3–1.2)

## 2011-01-30 LAB — GLUCOSE, CAPILLARY
Glucose-Capillary: 212 mg/dL — ABNORMAL HIGH (ref 70–99)
Glucose-Capillary: 171 mg/dL — ABNORMAL HIGH (ref 70–99)
Glucose-Capillary: 147 mg/dL — ABNORMAL HIGH (ref 70–99)
Glucose-Capillary: 181 mg/dL — ABNORMAL HIGH (ref 70–99)

## 2011-01-30 LAB — DIFFERENTIAL
Basophils Absolute: 0 10*3/uL (ref 0.0–0.1)
Basophils Relative: 0 % (ref 0–1)
Lymphocytes Relative: 4 % — ABNORMAL LOW (ref 12–46)
Monocytes Absolute: 0.8 10*3/uL (ref 0.1–1.0)
Neutro Abs: 11.2 10*3/uL — ABNORMAL HIGH (ref 1.7–7.7)
Neutrophils Relative %: 89 % — ABNORMAL HIGH (ref 43–77)

## 2011-01-30 MED ORDER — BISACODYL 10 MG RE SUPP
10.0000 mg | Freq: Every day | RECTAL | Status: DC
  Administered 2011-01-31 – 2011-02-24 (×23): 10 mg via RECTAL
  Filled 2011-01-30 (×24): qty 1

## 2011-01-30 MED ORDER — INSULIN ASPART 100 UNIT/ML ~~LOC~~ SOLN
0.0000 [IU] | Freq: Three times a day (TID) | SUBCUTANEOUS | Status: DC
  Administered 2011-01-30: 1 [IU] via SUBCUTANEOUS
  Administered 2011-01-31: 2 [IU] via SUBCUTANEOUS
  Administered 2011-01-31: 1 [IU] via SUBCUTANEOUS
  Administered 2011-01-31: 2 [IU] via SUBCUTANEOUS
  Administered 2011-02-01: 3 [IU] via SUBCUTANEOUS
  Administered 2011-02-01 – 2011-02-03 (×6): 2 [IU] via SUBCUTANEOUS
  Administered 2011-02-03: 3 [IU] via SUBCUTANEOUS
  Administered 2011-02-03 – 2011-02-05 (×7): 2 [IU] via SUBCUTANEOUS
  Administered 2011-02-06: 3 [IU] via SUBCUTANEOUS
  Administered 2011-02-06: 1 [IU] via SUBCUTANEOUS
  Administered 2011-02-07: 3 [IU] via SUBCUTANEOUS
  Administered 2011-02-07: 1 [IU] via SUBCUTANEOUS
  Administered 2011-02-09: 5 [IU] via SUBCUTANEOUS
  Administered 2011-02-10 – 2011-02-11 (×3): 1 [IU] via SUBCUTANEOUS
  Administered 2011-02-12: 2 [IU] via SUBCUTANEOUS
  Filled 2011-01-30 (×2): qty 3

## 2011-01-30 NOTE — Progress Notes (Addendum)
[facility]patient Rehabilitation Center Individual Statement of Services  Patient Name:  Ricky Bradley  Date:  01/30/2011  Welcome to the Inpatient Rehabilitation Center.  Our goal is to provide you with an individualized program based on your diagnosis and situation, designed to meet your specific needs.  With this comprehensive rehabilitation program, you will be expected to participate in at least 3 hours of rehabilitation therapies Monday-Friday, with modified therapy programming on the weekends.  Your rehabilitation program will include the following services:  Physical Therapy (PT), Occupational Therapy (OT), Speech Therapy (ST), 24 hour per day rehabilitation nursing, Therapeutic Recreaction (TR), Case Management (RN and Child psychotherapist), Rehabilitation Medicine, Nutrition Services and Pharmacy Services  Weekly team conferences will be held on Tuesdays to discuss your progress.  Your RN Case Designer, television/film set will talk with you frequently to get your input and to update you on team discussions.  Team conferences with you and your family in attendance may also be held.  Estimated length of stay: 4 weeks  Overall predicted outcome:Min - moderate assistance. Wheelchair mobility.  Depending on your progress and recovery, your program may change.  Your RN Case Estate agent will coordinate services and will keep you informed of any changes.  Your RN Sports coach and SW names and contact numbers are listed  below.  The following services may also be recommended but are not provided by the Inpatient Rehabilitation Center:   Driving Evaluations  Home Health Rehabiltiation Services  Outpatient Rehabilitatation [facility]  Vocational Rehabilitation   Arrangements will be made to provide these services after discharge if needed.  Arrangements include referral to agencies that provide these services.  Your insurance has been verified to be: Medicare + [facility] Your primary doctor is: Dr  Warrick Parisian  Pertinent information will be shared with your doctor and your insurance company.  Case Manager: Melanee Spry, [facility] [phone]  Social Worker:  [place], [place] [phone]  Information discussed with and copy given to patient by: Meryl Dare, 01/30/2011

## 2011-01-30 NOTE — Progress Notes (Signed)
Patient information reviewed and entered into [employer] system by Massai Hankerson, RN, CRRN, PPS Coordinator.  Information including medical coding and functional independence measure will be reviewed and updated through discharge.     Per nursing patient was given "Data Collection Information Summary for Patients in Inpatient Rehabilitation Facilities with attached "Privacy Act Statement-Health Care Records" upon admission.   

## 2011-01-30 NOTE — H&P (Signed)
Physical Medicine and Rehabilitation Admission H&P  Ricky Bradley is an 77 y.o. male.  Chief Complaint   Patient presents with   .  Weakness   :  HPI: 77 year old right-handed white male made November 25 of the bilateral lower extremity weakness that progressively worsened. Patient was similar episode in May of 2012 when he was seen by neurosurgery and underwent lumbar laminectomy for spinal stenosis. MRI of cervical spine showed severe stenosis of cervical C6 and cervical C7 with myelopathy. Patient underwent cervical C6-7 anterior cervical discectomy with decompression on December 3 by Dr. Tressie Stalker of neurosurgery. He was fitted with a cervical collar was to be worn at all times. Decadron protocol was initiated. Postoperative urinary retention and hematuria with noted history of benign prostatic hypertrophy. He was seen in consultation by urology services Dr. Retta Diones with Foley catheter tube inserted and placed on Flomax. He had been placed on Keflex December 7 for empiric coverage of suspect urinary tract infection with latest urine study showing 85,000 staph coagulase negative. Noted on December 10 patient to be quadriparetic with severe weakness his hands. Patient had been scheduled to go to inpatient rehabilitation services but due to increasing weakness neurosurgery was contacted and patient underwent MRI cervical spine showing cervical epidural hematoma. Underwent evacuation of cervical epidural hematoma on December 10 by Dr. Tressie Stalker.  He remains on Decadron therapy. Steroid-induced hyperglycemia and placed on low dose Lantus insulin. Hospital course Foley catheter tube removed for a voiding trial with noted elevated post void residuals of 800 mL and inability to void thus his Foley catheter tube was reinserted until more mobile than attempt another voiding trial. He currently requires total assist of 2 to roll to the right and +2 total assist for sit to stand. Relation has not yet  been tested. Physical medicine rehabilitation was again consulted and physical therapy felt patient would be an excellent candidate for inpatient rehabilitation services.  Review of Systems  Constitutional: Negative for fever and chills.  Eyes: Negative for double vision.  Respiratory: Negative for cough and shortness of breath.  Cardiovascular: Negative for chest pain.  Gastrointestinal: Positive for constipation. Negative for nausea.  Genitourinary: Positive for hematuria.  Urinary retention  Skin: Negative.  Neurological: Negative for dizziness, seizures and headaches.  Psychiatric/Behavioral: Positive for depression.   Past Medical History   Diagnosis  Date   .  Hypertension    .  GERD (gastroesophageal reflux disease)    .  High cholesterol    .  Enlarged prostate    .  High cholesterol     Past Surgical History   Procedure  Date   .  Joint replacement  2001 and 2002     both knees   .  Laminectomy  08/2010   .  Back surgery    .  Replacement total knee    .  Cholecystectomy  11/01/10   .  Anterior cervical decomp/discectomy fusion  01/16/2011     Procedure: ANTERIOR CERVICAL DECOMPRESSION/DISCECTOMY FUSION 1 LEVEL; Surgeon: Cristi Loron; Location: [facility]; Service: Neurosurgery; Laterality: N/A; Cervical six-seven Anterior Cervical Decomp[ression Fusion    History reviewed. No pertinent family history.  Social History: reports that he has quit smoking. He does not have any smokeless tobacco history on file. He reports that he does not drink alcohol or use illicit drugs.  Allergies:  Allergies   Allergen  Reactions   .  Vicodin (Hydrocodone-Acetaminophen)  Hives     On neck and  chest.    Medications Prior to Admission   Medication  Dose  Route  Frequency  Provider  Last Rate  Last Dose   .  acetaminophen (TYLENOL) tablet 650 mg  650 mg  Oral  Q6H PRN  Jacqulyn Ducking. Cammarata   650 mg at 01/25/11 1344    Or   .  acetaminophen (TYLENOL) suppository 650 mg  650 mg   Rectal  Q6H PRN  Skip Estimable     .  alum & mag hydroxide-simeth (MAALOX/MYLANTA) 200-200-20 MG/5ML suspension 30 mL  30 mL  Oral  Q4H PRN  Simbiso Ranga     .  bacitracin 45409 UNITS injection         .  bacitracin 81191 UNITS injection         .  bisacodyl (DULCOLAX) suppository 10 mg  10 mg  Rectal  Daily PRN  Simbiso Ranga     .  ceFAZolin (ANCEF) 1-5 GM-% IVPB         .  ceFAZolin (ANCEF) IVPB 1 g/50 mL premix  1 g  Intravenous  60 min Pre-Op  Duane Lope Jenkins   1 g at 01/16/11 4782   .  ceFAZolin (ANCEF) IVPB 1 g/50 mL premix  1 g  Intravenous  Q8H  Duane Lope Jenkins   1 g at 01/16/11 2250   .  ceFAZolin (ANCEF) IVPB 1 g/50 mL premix  1 g  Intravenous  Q8H  Duane Lope Jenkins   1 g at 01/24/11 0631   .  citalopram (CELEXA) tablet 10 mg  10 mg  Oral  Daily  Mcarthur Rossetti. Angiulli, PA   10 mg at 01/26/11 1141   .  dexamethasone (DECADRON) tablet 8 mg  8 mg  Oral  Q6H  Daniel J. Angiulli, PA   8 mg at 01/26/11 1142   .  diazepam (VALIUM) tablet 5 mg  5 mg  Oral  Q6H PRN  Cristi Loron     .  dicyclomine (BENTYL) capsule 10 mg  10 mg  Oral  TID AC & HS  Jacqulyn Ducking. Cammarata   10 mg at 01/26/11 1141   .  docusate sodium (COLACE) capsule 100 mg  100 mg  Oral  BID  Cristi Loron   100 mg at 01/25/11 2245   .  enoxaparin (LOVENOX) injection 40 mg  40 mg  Subcutaneous  Q24H  Cristi Loron   40 mg at 01/14/11 2202   .  gadobenate dimeglumine (MULTIHANCE) injection 15 mL  15 mL  Intravenous  Once  Medication Radiologist   15 mL at 01/09/11 1156   .  gadobenate dimeglumine (MULTIHANCE) injection 17 mL  17 mL  Intravenous  Once PRN  Medication Radiologist   17 mL at 01/08/11 1722   .  gi cocktail  30 mL  Oral  BID PRN  Christina Rama   30 mL at 01/14/11 1806   .  insulin aspart (novoLOG) injection 0-15 Units  0-15 Units  Subcutaneous  TID WC  Vassie Loll, MD   8 Units at 01/26/11 1322   .  insulin glargine (LANTUS) injection 10 Units  10 Units  Subcutaneous  Q0700  Simbiso Ranga   10  Units at 01/26/11 0859   .  lactated ringers infusion   Intravenous  Continuous  Cristi Loron     .  lidocaine (XYLOCAINE) 2 % jelly   Urethral  Once  [employer]     .  loratadine (CLARITIN) tablet 10 mg  10 mg  Oral  Once  Rolan Lipa   10 mg at 01/14/11 2327   .  loratadine (CLARITIN) tablet 10 mg  10 mg  Oral  Daily  Michelle A. Matthews   10 mg at 01/26/11 1141   .  LORazepam (ATIVAN) 2 MG/ML injection         .  LORazepam (ATIVAN) injection 0.5 mg  0.5 mg  Intravenous  Once  Simbiso Ranga   0.5 mg at 01/23/11 1543   .  LORazepam (ATIVAN) injection 1 mg  1 mg  Intravenous  Once  Jacqulyn Ducking. Cammarata   1 mg at 01/23/11 1506   .  LORazepam (ATIVAN) injection 1 mg  1 mg  Intravenous  Once  Shanker Ghimire   1 mg at 01/09/11 0918   .  LORazepam (ATIVAN) injection 1 mg  1 mg  Intravenous  Once  Shanker Ghimire   1 mg at 01/09/11 0948   .  LORazepam (ATIVAN) injection 2 mg  2 mg  Intravenous  Once  Thomasene Lot, PA   1 mg at 01/08/11 1427   .  menthol-cetylpyridinium (CEPACOL) lozenge 3 mg  1 lozenge  Oral  PRN  Cristi Loron      Or   .  phenol (CHLORASEPTIC) mouth spray 1 spray  1 spray  Mouth/Throat  PRN  Cristi Loron     .  metoprolol succinate (TOPROL-XL) 24 hr tablet 12.5 mg  12.5 mg  Oral  BID  Christina Rama   12.5 mg at 01/26/11 1142   .  midazolam (VERSED) injection 0.5-2 mg  0.5-2 mg  Intravenous  Once PRN  Judie Petit, MD     .  morphine 4 MG/ML injection 1-4 mg  1-4 mg  Intravenous  Q3H PRN  Cristi Loron   2 mg at 01/26/11 0155   .  ondansetron (ZOFRAN) injection 4 mg  4 mg  Intravenous  Once PRN  Aubery Lapping, MD     .  ondansetron [facility]) injection 4 mg  4 mg  Intravenous  Q4H PRN  Cristi Loron     .  oxyCODONE (Oxy IR/ROXICODONE) immediate release tablet 5 mg  5 mg  Oral  Q4H PRN  Mcarthur Rossetti. Angiulli, PA   5 mg at 01/25/11 2245   .  pantoprazole (PROTONIX) EC tablet 40 mg  40 mg  Oral  BID AC  Daniel J. Angiulli, PA   40 mg at  01/26/11 0900   .  polyethylene glycol (MIRALAX / GLYCOLAX) packet 17 g  17 g  Oral  BID  Mcarthur Rossetti. Angiulli, PA   17 g at 01/25/11 2244   .  polyvinyl alcohol (LIQUIFILM TEARS) 1.4 % ophthalmic solution 1 drop  1 drop  Left Eye  Q4H PRN  Mcarthur Rossetti. Angiulli, PA     .  promethazine (PHENERGAN) injection 6.25-12.5 mg  6.25-12.5 mg  Intravenous  Q15 min PRN  Judie Petit, MD     .  simethicone [facility]) chewable tablet 80 mg  80 mg  Oral  QID  Mcarthur Rossetti. Angiulli, PA   80 mg at 01/26/11 1142   .  simvastatin (ZOCOR) tablet 20 mg  20 mg  Oral  q1800  Mcarthur Rossetti. Angiulli, PA   20 mg at 01/25/11 2002   .  sodium chloride 0.9 % bolus 1,000 mL  1,000 mL  Intravenous  Once  Thomasene Lot,  PA   1,000 mL at 01/08/11 1156   .  sodium chloride 0.9 % infusion         .  sodium chloride 0.9 % infusion         .  sodium phosphate (FLEET) 7-19 GM/118ML enema 1 enema  1 enema  Rectal  Once  Michelle A. Matthews   1 enema at 01/14/11 1806   .  sodium phosphate (FLEET) 7-19 GM/118ML enema 1 enema  1 enema  Rectal  Daily PRN  Cristi Loron     .  Tamsulosin HCl (FLOMAX) capsule 0.4 mg  0.4 mg  Oral  BID  Mcarthur Rossetti. Angiulli, PA   0.4 mg at 01/26/11 1141   .  zolpidem (AMBIEN) tablet 5 mg  5 mg  Oral  QHS PRN  Cristi Loron   5 mg at 01/24/11 2120   .  DISCONTD: 0.9 % sodium chloride infusion  250 mL  Intravenous  Continuous  Cristi Loron  1 mL/hr at 01/22/11 1427  500 mL at 01/22/11 1427   .  DISCONTD: 50,000 units bacitracin in 0.9% normal saline 250 mL irrigation    PRN  Cristi Loron     .  DISCONTD: acetaminophen (TYLENOL) suppository 650 mg  650 mg  Rectal  Q4H PRN  Cristi Loron     .  DISCONTD: acetaminophen (TYLENOL) suppository 650 mg  650 mg  Rectal  Q4H PRN  Cristi Loron     .  DISCONTD: acetaminophen (TYLENOL) tablet 650 mg  650 mg  Oral  Q4H PRN  Cristi Loron     .  DISCONTD: aspirin EC tablet 81 mg  81 mg  Oral  Daily  Skip Estimable     .  DISCONTD: aspirin EC  tablet 81 mg  81 mg  Oral  Daily  Eugene Garnet, PHARMD   81 mg at 01/24/11 1610   .  DISCONTD: aspirin EC tablet 81 mg  81 mg  Oral  Daily  Mcarthur Rossetti. Angiulli, PA     .  DISCONTD: bacitracin ointment    PRN  Cristi Loron   1 application at 01/16/11 9604   .  DISCONTD: bacitracin ointment    PRN  Cristi Loron   1 application at 01/23/11 2127   .  DISCONTD: bethanechol (URECHOLINE) tablet 25 mg  25 mg  Oral  BID  Jacqulyn Ducking. Cammarata   25 mg at 01/11/11 1037   .  DISCONTD: bupivacaine-EPINEPHrine (MARCAINE W/ EPI) 0.5 % (with pres) injection    PRN  Cristi Loron   10 mL at 01/16/11 0749   .  DISCONTD: ceFAZolin (ANCEF) IVPB 1 g/50 mL premix  1 g  Intravenous  Once  Cristi Loron     .  DISCONTD: cephALEXin (KEFLEX) capsule 500 mg  500 mg  Oral  Q6H  Vassie Loll, MD   500 mg at 01/23/11 1813   .  DISCONTD: cephALEXin (KEFLEX) capsule 500 mg  500 mg  Oral  Q6H  Daniel J. Angiulli, PA   500 mg at 01/24/11 5409   .  DISCONTD: cetirizine (ZYRTEC) chewable tablet 5 mg  5 mg  Oral  QHS  Skip Estimable     .  DISCONTD: ciprofloxacin (CIPRO) tablet 500 mg  500 mg  Oral  BID  Michelle A. Matthews   500 mg at 01/20/11 0836   .  DISCONTD: citalopram (CELEXA)  tablet 10 mg  10 mg  Oral  Daily  Jacqulyn Ducking. Cammarata   10 mg at 01/23/11 1054   .  DISCONTD: dexamethasone (DECADRON) injection 6 mg  6 mg  Intravenous  Q6H  Shanker Ghimire     .  DISCONTD: dexamethasone (DECADRON) injection 8 mg  8 mg  Intravenous  Q6H  Shanker Ghimire     .  DISCONTD: dexamethasone (DECADRON) injection 8 mg  8 mg  Intravenous  Q6H  Shanker Ghimire     .  DISCONTD: dexamethasone (DECADRON) injection 8 mg  8 mg  Intravenous  Q6H  Shanker Ghimire   8 mg at 01/11/11 0610   .  DISCONTD: dexamethasone (DECADRON) injection 8 mg  8 mg  Intravenous  QID  Stasia Cavalier, RPH   8 mg at 01/22/11 2255   .  DISCONTD: dexamethasone (DECADRON) tablet 8 mg  8 mg  Oral  Q6H  Daniel J. Angiulli, PA   8 mg at 01/23/11 1812    .  DISCONTD: diazepam (VALIUM) tablet 5 mg  5 mg  Oral  Q6H PRN  Cristi Loron   5 mg at 01/23/11 1055   .  DISCONTD: dicyclomine (BENTYL) capsule 10 mg  10 mg  Oral  TID AC & HS  Mcarthur Rossetti. Angiulli, PA     .  DISCONTD: docusate sodium (COLACE) capsule 100 mg  100 mg  Oral  BID  Cristi Loron   100 mg at 01/23/11 1056   .  DISCONTD: fentaNYL (SUBLIMAZE) injection 50 mcg  50 mcg  Intravenous  Once  Judie Petit, MD     .  DISCONTD: hemostatic agents    PRN  Cristi Loron   1 application at 01/16/11 [phone]   .  DISCONTD: hemostatic agents    PRN  Cristi Loron   1 application at 01/23/11 2037   .  DISCONTD: HYDROmorphone (DILAUDID) injection 0.25-0.5 mg  0.25-0.5 mg  Intravenous  Q5 min PRN  Aubery Lapping, MD     .  DISCONTD: hydroxypropyl methylcellulose (ISOPTO TEARS) 2.5 % ophthalmic solution 1 drop  1 drop  Left Eye  QID PRN  Michelle A. Ashley Royalty     .  DISCONTD: insulin aspart (novoLOG) injection 0-15 Units  0-15 Units  Subcutaneous  TID WC  Mcarthur Rossetti. Angiulli, PA     .  DISCONTD: insulin aspart (novoLOG) injection 0-5 Units  0-5 Units  Subcutaneous  QHS  Vassie Loll, MD   2 Units at 01/22/11 2254   .  DISCONTD: insulin glargine (LANTUS) injection 10 Units  10 Units  Subcutaneous  Q0700  Simbiso Ranga     .  DISCONTD: insulin glargine (LANTUS) injection 8 Units  8 Units  Subcutaneous  Q0700  Simbiso Ranga   8 Units at 01/23/11 0740   .  DISCONTD: insulin glargine (LANTUS) injection 8 Units  8 Units  Subcutaneous  Q0700  Mcarthur Rossetti. Angiulli, PA   8 Units at 01/24/11 0815   .  DISCONTD: lactated ringers infusion   Intravenous  Continuous  Simbiso Ranga  20 mL/hr at 01/22/11 2330    .  DISCONTD: loratadine (CLARITIN) tablet 10 mg  10 mg  Oral  Daily  Eugene Garnet, PHARMD   10 mg at 01/12/11 1055   .  DISCONTD: loratadine (CLARITIN) tablet 10 mg  10 mg  Oral  Daily  Christina Rama   10 mg at 01/13/11 1457   .  DISCONTD: loratadine (CLARITIN) tablet 10 mg  10 mg  Oral   Daily  Mcarthur Rossetti. Angiulli, PA     .  DISCONTD: LORazepam (ATIVAN) tablet 1 mg  1 mg  Oral  Once  Cristi Loron     .  DISCONTD: menthol-cetylpyridinium (CEPACOL) lozenge 3 mg  1 lozenge  Oral  PRN  Cristi Loron     .  DISCONTD: meperidine (DEMEROL) injection 6.25-12.5 mg  6.25-12.5 mg  Intravenous  Q5 min PRN  Aubery Lapping, MD     .  DISCONTD: methocarbamol (ROBAXIN) tablet 500 mg  500 mg  Oral  Q6H PRN  Vassie Loll, MD     .  DISCONTD: methocarbamol (ROBAXIN) tablet 500 mg  500 mg  Oral  Q6H PRN  Mcarthur Rossetti. Angiulli, PA     .  DISCONTD: methocarbamol (ROBAXIN) tablet 500 mg  500 mg  Oral  Q6H PRN  Mcarthur Rossetti. Angiulli, PA     .  DISCONTD: metoprolol succinate (TOPROL-XL) 24 hr tablet 12.5 mg  12.5 mg  Oral  Daily  Jacqulyn Ducking. Cammarata   12.5 mg at 01/13/11 1137   .  DISCONTD: metoprolol succinate (TOPROL-XL) 24 hr tablet 12.5 mg  12.5 mg  Oral  BID  Cristi Loron     .  DISCONTD: metoprolol succinate (TOPROL-XL) 24 hr tablet 12.5 mg  12.5 mg  Oral  BID  Mcarthur Rossetti. Angiulli, PA   12.5 mg at 01/24/11 0121   .  DISCONTD: morphine 4 MG/ML injection 1-4 mg  1-4 mg  Intravenous  Q3H PRN  Cristi Loron   4 mg at 01/22/11 2256   .  DISCONTD: NON FORMULARY   Mouth/Throat  1 day or 1 dose  Cristi Loron     .  DISCONTD: ondansetron (ZOFRAN) injection 4 mg  4 mg  Intravenous  Q6H PRN  Skip Estimable     .  DISCONTD: ondansetron (ZOFRAN) injection 4 mg  4 mg  Intravenous  Q4H PRN  Cristi Loron     .  DISCONTD: ondansetron (ZOFRAN) tablet 4 mg  4 mg  Oral  Q6H PRN  Jacqulyn Ducking. Cammarata   4 mg at 01/13/11 1458   .  DISCONTD: oxyCODONE (Oxy IR/ROXICODONE) immediate release tablet 5 mg  5 mg  Oral  Q4H PRN  Jacqulyn Ducking. Cammarata   5 mg at 01/23/11 1055   .  DISCONTD: oxyCODONE-acetaminophen (PERCOCET) 5-325 MG per tablet 1-2 tablet  1-2 tablet  Oral  Q4H PRN  Cristi Loron     .  DISCONTD: pantoprazole (PROTONIX) EC tablet 40 mg  40 mg  Oral  Daily  Jacqulyn Ducking. Cammarata   40  mg at 01/22/11 1028   .  DISCONTD: pantoprazole (PROTONIX) EC tablet 40 mg  40 mg  Oral  BID AC  Simbiso Ranga   40 mg at 01/23/11 1812   .  DISCONTD: phenol (CHLORASEPTIC) mouth spray 1 spray  1 spray  Mouth/Throat  PRN  Cristi Loron     .  DISCONTD: polyethylene glycol (MIRALAX / GLYCOLAX) packet 17 g  17 g  Oral  BID  Shanker Ghimire   17 g at 01/24/11 0120   .  DISCONTD: polyvinyl alcohol (LIQUIFILM TEARS) 1.4 % ophthalmic solution 1 drop  1 drop  Left Eye  Q4H PRN  Michelle A. Matthews   1 drop at 01/16/11 1628   .  DISCONTD: senna-docusate (Senokot-S) tablet  1 tablet  1 tablet  Oral  BID  Simbiso Ranga   1 tablet at 01/23/11 1400   .  DISCONTD: simethicone (MYLICON) chewable tablet 80 mg  80 mg  Oral  QID  Cristi Loron   80 mg at 01/23/11 1812   .  DISCONTD: simvastatin (ZOCOR) tablet 20 mg  20 mg  Oral  q1800  Jacqulyn Ducking. Cammarata   20 mg at 01/23/11 1813   .  DISCONTD: sodium chloride 0.9 % injection 3 mL  3 mL  Intravenous  Q12H  Duane Lope Jenkins   3 mL at 01/23/11 1000   .  DISCONTD: sodium chloride 0.9 % injection 3 mL  3 mL  Intravenous  PRN  Cristi Loron     .  DISCONTD: sodium chloride irrigation 0.9 %    PRN  Cristi Loron   1,000 mL at 01/23/11 2037   .  DISCONTD: Tamsulosin HCl (FLOMAX) capsule 0.4 mg  0.4 mg  Oral  BID  Jacqulyn Ducking. Cammarata   0.4 mg at 01/23/11 1054   .  DISCONTD: thrombin kit 5000 units    PRN  Cristi Loron   5,000 Units at 01/16/11 4782   .  DISCONTD: thrombin kit 5000 units    PRN  Cristi Loron   5,000 Units at 01/23/11 2038   .  DISCONTD: zolpidem (AMBIEN) tablet 5 mg  5 mg  Oral  QHS PRN  Gery Pray, MD   5 mg at 01/21/11 2138   .  DISCONTD: zolpidem (AMBIEN) tablet 5 mg  5 mg  Oral  QHS PRN  Cristi Loron   5 mg at 01/22/11 2314    Medications Prior to Admission   Medication  Sig  Dispense  Refill   .  aspirin 81 MG EC tablet  Take 81 mg by mouth daily. Stop taking before surgery     .  bethanechol (URECHOLINE) 25 MG  tablet  Take 25 mg by mouth 2 (two) times daily.     .  Cetirizine HCl (ZYRTEC PO)  Take by mouth daily.     Marland Kitchen  lovastatin (MEVACOR) 40 MG tablet  Take 40 mg by mouth at bedtime.     .  metoprolol succinate (TOPROL-XL) 25 MG 24 hr tablet  Take 12.5 mg by mouth 2 (two) times daily.     Marland Kitchen  NEXIUM 40 MG capsule  Take 40 mg by mouth daily before breakfast.     .  Tamsulosin HCl (FLOMAX) 0.4 MG CAPS  Take 0.4 mg by mouth 2 (two) times daily.      Home:  Home Living  Lives With: Spouse  Receives Help From: Family;Other (Comment)  Type of Home: House  Home Layout: One level  Home Access: Stairs to enter  Entrance Stairs-Rails: Left  Entrance Stairs-Number of Steps: 3  Bathroom Shower/Tub: Medical sales representative: Standard  Bathroom Accessibility: Yes  Home Adaptive Equipment: Bedside commode/3-in-1;Walker - rolling;Tub transfer bench;Straight cane  Functional History:  Prior Function  Level of Independence: Independent with basic ADLs;Independent with homemaking with ambulation;Requires assistive device for independence;Independent with transfers  Able to Take Stairs?: Yes  Vocation: Retired  Functional Status:  Mobility:  Bed Mobility  Bed Mobility: Yes  Rolling Right: 1: +2 Total assist;With rail  Rolling Right Details (indicate cue type and reason): pt=20%. (A) to position LE's, initate & carry out rolling, sequencing. Cues for sequencing/technqiue.  Rolling Left: 1: +2 Total assist;With  rail  Rolling Left Details (indicate cue type and reason): pt=20%; (A) to bend/position bil LE's, rotate trunk/pelvis & cues for sequencing/technique.  Right Sidelying to Sit: 1: +2 Total assist;With rails  Right Sidelying to Sit Details (indicate cue type and reason): pt= < 5%. (A) for all components of transition. Cues for sequencing, technique.  Left Sidelying to Sit: Not tested (comment)  Left Sidelying to Sit Details (indicate cue type and reason): Pt demonstrated little to no truck  control.  Sit to Supine - Right: 1: +2 Total assist  Sit to Supine - Right Details (indicate cue type and reason): pt= < 5%; (A) to control shoulders/trunk to supine position & correctly position torso/shoulders. (A) for bil LE's.  Transfers  Transfers: Yes  Sit to Stand: 1: +2 Total assist;From bed  Sit to Stand Details (indicate cue type and reason): Attempted sit<>stand 2x's from bed; unsuccessful at achieving standing. Pt only able to clear hips off bed ~2-3 inches. pt= <10%.  Stand to Sit: 1: +1 Total assist;To bed  Stand to Sit Details: Assist to control eccentric descent from squat standing with blocking at bilateral knees. Cues for sequence.  Squat Pivot Transfers: Not tested (comment)  Squat Pivot Transfer Details (indicate cue type and reason): max cues and total support provided to trunk and blocking of Bilateral LE.  Ambulation/Gait  Ambulation/Gait: No  Ambulation/Gait Assistance: Not tested (comment)  Ambulation/Gait Assistance Details (indicate cue type and reason): Assist for balance with facilitation at sacrum to increase hip extension while blocking bilateral knees to prevent flexion. Occasional hyperextension of left knee.  Ambulation Distance (Feet): 2 Feet  Assistive device: 2 person hand held assist  Gait Pattern: Decreased step length - right;Decreased step length - left;Decreased dorsiflexion - right;Decreased weight shift to right;Decreased weight shift to left;Trunk flexed;Shuffle  Stairs: No  Wheelchair Mobility  Wheelchair Mobility: No  ADL:  ADL  Eating/Feeding: Performed;Minimal assistance  Eating/Feeding Details (indicate cue type and reason): son assisted with set-up and preparation of food/containers  Where Assessed - Eating/Feeding: Bed level  Grooming: Performed;Wash/dry face;Set up;Moderate assistance  Grooming Details (indicate cue type and reason): Required up to Max assist for sitting balance at EOB  Where Assessed - Grooming: Sitting, bed  Upper  Body Bathing: Simulated;Supervision/safety  Where Assessed - Upper Body Bathing: Sitting, chair;Supported  Lower Body Bathing: Simulated;Moderate assistance  Where Assessed - Lower Body Bathing: Sit to stand from bed  Upper Body Dressing: Simulated;Supervision/safety  Where Assessed - Upper Body Dressing: Sitting, bed  Lower Body Dressing: +2 Total assistance  Where Assessed - Lower Body Dressing: Sitting, bed  Toilet Transfer: Simulated;Moderate assistance  Toilet Transfer Method: Stand pivot  Toilet Transfer Equipment: (Bedside chair)  Toileting - Clothing Manipulation: Simulated;Moderate assistance  Where Assessed - Toileting Clothing Manipulation: (Sit to stand from bedside chair.)  Toileting - Hygiene: +1 Total assistance;Performed  Toileting - Hygiene Details (indicate cue type and reason): +2total(pt=20%) to roll left and right  Where Assessed - Toileting Hygiene: Rolling right and/or left  Tub/Shower Transfer: Not assessed  Tub/Shower Transfer Method: Not assessed  Equipment Used: Rolling walker  ADL Comments: Attempted sit to stand from EOB x2, able to clear bottom off bed but unable to stand. Pt +2 total (pt=less than or equal to 10%) for sit -> squat  Cognition:  Cognition  Arousal/Alertness: Awake/alert  Orientation Level: Oriented X4  Cognition  Arousal/Alertness: Awake/alert  Overall Cognitive Status: Appears within functional limits for tasks assessed  Orientation Level: Oriented X4  Blood pressure 129/82,  pulse 78, temperature 97.4 F (36.3 C), temperature source Oral, resp. rate 32, height 6' (1.829 m), weight 76.6 kg (168 lb 14 oz), SpO2 97.00%.  Physical Exam  Constitutional: He is oriented to person, place, and time. He appears well-developed.  Neck:  Cervical collar in place  Cardiovascular: Normal rate and regular rhythm.  Pulmonary/Chest: Effort normal and breath sounds normal. He has no wheezes.  Abdominal: He exhibits no distension. There is no tenderness.  There is no guarding.  Musculoskeletal: He exhibits no edema.  Neurological: He is alert and oriented to person, place, and time. He displays normal reflexes.  Reflex Scores:  Tricep reflexes are 1+ on the right side and 1+ on the left side.  Bicep reflexes are 1+ on the right side and 1+ on the left side.  Brachioradialis reflexes are 1+ on the right side and 1+ on the left side.  Patellar reflexes are 3+ on the right side and 3+ on the left side.  Achilles reflexes are 3+ on the right side and 3+ on the left side. One out of 2 sensation below the level of injury. Patient has 2/5 wrist extension 1/5 hand intrinsic muscle use, 4/5 biceps and triceps as well as shoulders. Lower extremities are notable for 0 to trace motor function on the right leg and trace motor function of the left leg more distally than proximally with the ankle dorsiflexors and plantar flexors.  Skin:  Surgical site clean and dry.  Psychiatric: His behavior is normal.   Results for orders placed during the hospital encounter of 01/08/11 (from the past 48 hour(s))   GLUCOSE, CAPILLARY Status: Abnormal    Collection Time    01/24/11 5:44 PM   Component  Value  Range  Comment    Glucose-Capillary  181 (*)  70 - 99 (mg/dL)    GLUCOSE, CAPILLARY Status: Abnormal    Collection Time    01/24/11 9:33 PM   Component  Value  Range  Comment    Glucose-Capillary  208 (*)  70 - 99 (mg/dL)    CBC Status: Abnormal    Collection Time    01/25/11 3:40 AM   Component  Value  Range  Comment    WBC  18.5 (*)  4.0 - 10.5 (K/uL)     RBC  4.63  4.22 - 5.81 (MIL/uL)     Hemoglobin  13.2  13.0 - 17.0 (g/dL)     HCT  16.1 (*)  09.6 - 52.0 (%)     MCV  82.7  78.0 - 100.0 (fL)     MCH  28.5  26.0 - 34.0 (pg)     MCHC  34.5  30.0 - 36.0 (g/dL)     RDW  04.5  40.9 - 15.5 (%)     Platelets  155  150 - 400 (K/uL)    COMPREHENSIVE METABOLIC PANEL Status: Abnormal    Collection Time    01/25/11 3:40 AM   Component  Value  Range  Comment      Sodium  130 (*)  135 - 145 (mEq/L)     Potassium  4.7  3.5 - 5.1 (mEq/L)     Chloride  92 (*)  96 - 112 (mEq/L)     CO2  30  19 - 32 (mEq/L)     Glucose, Bld  214 (*)  70 - 99 (mg/dL)     BUN  29 (*)  6 - 23 (mg/dL)     Creatinine, Ser  0.70  0.50 - 1.35 (mg/dL)     Calcium  8.6  8.4 - 10.5 (mg/dL)     Total Protein  5.3 (*)  6.0 - 8.3 (g/dL)     Albumin  2.5 (*)  3.5 - 5.2 (g/dL)     AST  11  0 - 37 (U/L)     ALT  18  0 - 53 (U/L)     Alkaline Phosphatase  66  39 - 117 (U/L)     Total Bilirubin  0.4  0.3 - 1.2 (mg/dL)     GFR calc non Af Amer  >90  >90 (mL/min)     GFR calc Af Amer  >90  >90 (mL/min)    PROTIME-INR Status: Normal    Collection Time    01/25/11 3:40 AM   Component  Value  Range  Comment    Prothrombin Time  13.7  11.6 - 15.2 (seconds)     INR  1.03  0.00 - 1.49    GLUCOSE, CAPILLARY Status: Abnormal    Collection Time    01/25/11 7:35 AM   Component  Value  Range  Comment    Glucose-Capillary  207 (*)  70 - 99 (mg/dL)    GLUCOSE, CAPILLARY Status: Abnormal    Collection Time    01/25/11 1:51 PM   Component  Value  Range  Comment    Glucose-Capillary  245 (*)  70 - 99 (mg/dL)    GLUCOSE, CAPILLARY Status: Abnormal    Collection Time    01/25/11 4:07 PM   Component  Value  Range  Comment    Glucose-Capillary  274 (*)  70 - 99 (mg/dL)     Comment 1  Notify RN      Comment 2  Documented in Chart     GLUCOSE, CAPILLARY Status: Abnormal    Collection Time    01/25/11 10:36 PM   Component  Value  Range  Comment    Glucose-Capillary  191 (*)  70 - 99 (mg/dL)     Comment 1  Notify RN      Comment 2  Documented in Chart     CBC Status: Abnormal    Collection Time    01/26/11 4:00 AM   Component  Value  Range  Comment    WBC  17.5 (*)  4.0 - 10.5 (K/uL)     RBC  4.69  4.22 - 5.81 (MIL/uL)     Hemoglobin  13.7  13.0 - 17.0 (g/dL)     HCT  41.3 (*)  24.4 - 52.0 (%)     MCV  82.3  78.0 - 100.0 (fL)     MCH  29.2  26.0 - 34.0 (pg)     MCHC  35.5  30.0 -  36.0 (g/dL)     RDW  01.0  27.2 - 15.5 (%)     Platelets  159  150 - 400 (K/uL)    DIFFERENTIAL Status: Abnormal    Collection Time    01/26/11 4:00 AM   Component  Value  Range  Comment    Neutrophils Relative  94 (*)  43 - 77 (%)     Neutro Abs  16.4 (*)  1.7 - 7.7 (K/uL)     Lymphocytes Relative  3 (*)  12 - 46 (%)     Lymphs Abs  0.5 (*)  0.7 - 4.0 (K/uL)     Monocytes Relative  3  3 - 12 (%)  Monocytes Absolute  0.6  0.1 - 1.0 (K/uL)     Eosinophils Relative  0  0 - 5 (%)     Eosinophils Absolute  0.0  0.0 - 0.7 (K/uL)     Basophils Relative  0  0 - 1 (%)     Basophils Absolute  0.0  0.0 - 0.1 (K/uL)    COMPREHENSIVE METABOLIC PANEL Status: Abnormal    Collection Time    01/26/11 4:00 AM   Component  Value  Range  Comment    Sodium  126 (*)  135 - 145 (mEq/L)     Potassium  4.8  3.5 - 5.1 (mEq/L)     Chloride  89 (*)  96 - 112 (mEq/L)     CO2  29  19 - 32 (mEq/L)     Glucose, Bld  233 (*)  70 - 99 (mg/dL)     BUN  30 (*)  6 - 23 (mg/dL)     Creatinine, Ser  4.78  0.50 - 1.35 (mg/dL)     Calcium  8.6  8.4 - 10.5 (mg/dL)     Total Protein  5.2 (*)  6.0 - 8.3 (g/dL)     Albumin  2.5 (*)  3.5 - 5.2 (g/dL)     AST  10  0 - 37 (U/L)     ALT  19  0 - 53 (U/L)     Alkaline Phosphatase  63  39 - 117 (U/L)     Total Bilirubin  0.4  0.3 - 1.2 (mg/dL)     GFR calc non Af Amer  88 (*)  >90 (mL/min)     GFR calc Af Amer  >90  >90 (mL/min)    GLUCOSE, CAPILLARY Status: Abnormal    Collection Time    01/26/11 11:17 AM   Component  Value  Range  Comment    Glucose-Capillary  266 (*)  70 - 99 (mg/dL)     No results found.  Post Admission Physician Evaluation:  1. Functional deficits secondary to cervical stenosis with myelopathy status post decompression. Patient with postoperative epidural hematoma requiring surgical reexploration and evacuation. 2. Patient is admitted to receive collaborative, interdisciplinary care between the physiatrist, rehab nursing staff, and therapy  team. 3. Patient's level of medical complexity and substantial therapy needs in context of that medical necessity cannot be provided at a lesser intensity of care such as a SNF. 4. Patient has experienced substantial functional loss from his/her baseline which was documented above under the "Functional History" and "Functional Status" headings. Judging by the patient's diagnosis, physical exam, and functional history, the patient has potential for functional progress which will result in measurable gains while on inpatient rehab. These gains will be of substantial and practical use upon discharge in facilitating mobility and self-care at the household level. 5. Physiatrist will provide 24 hour management of medical needs as well as oversight of the therapy plan/treatment and provide guidance as appropriate regarding the interaction of the two. 6. 24 hour rehab nursing will assist with bladder management, bowel management, safety, skin/wound care, disease management, medication administration, pain management and patient education and help integrate therapy concepts, techniques,education, etc. 7. PT will assess and treat for: Lower extremity strength, adaptive techniques, range of motion, pain management, transfer technique. Goals are: Supervision with wheelchair mobility and minimal assist to moderate assist with transfers.. 8. OT will assess and treat for: Upper extremity strength, adaptive techniques and equipment, neuromuscular reeducation, transfer training, and ADL training.  Goals are: Supervision to moderate assistance. 9. SLP will assess and treat for: Not app 10. Case Management and Social Worker will assess and treat for psychological issues and discharge planning. 11. Team conference will be held weekly to assess progress toward goals and to determine barriers to discharge. 12. Patient will receive at least 3 hours of therapy per day at least 5 days per week. 13. ELOS and Prognosis: 4 weeks  good Medical Problem List and Plan:  1. Cervical stenosis with myelopathy-status post cervical discectomy C 6-7 anterior cervical discectomy with decompression December 3. Post operative epidural hematoma evacuation of hematoma December 10. Cervical collar at all times. Decadron taper as indicated to 1 mg every 6 hours  2.. DVT Prophylaxis/Anticoagulation: SCDs and support hose. Monitor for deep vein thrombosis. Admission lower extremity venous Dopplers will be ordered  3. Pain Management: Oxycodone/Valium as needed. Monitor with increased activity. Patient currently denies any pain.  4. Steroid-induced hyperglycemia- Check blood sugars a.c. and at bedtime. Should do much better now that Decadron has been tapered and monitored closely  5. Neurogenic bladder/hematuria/BPH-Foley catheter tube currently in place after a failed voiding trial. Continue Flomax twice daily. No current hematuria reported. Attempt voiding trial once established on rehabilitation and as functional mobility increases.  6. neurogenic bowel -establish a bowel program and begin scheduled laxatives. Patient is no current nausea or vomiting  7. Hypertension-Toprol 12.5 mg daily. Monitor with increased activity  8. Depression-continue Celexa daily. Emotional support and positive reinforcement  9. Hyperlipidemia-Zocor  01/26/2011, 2:08 PM   (This H&P was completed before the patient arrived on the rehab floor on the day of admission.  I am transferring it to the "rehab chart" for purpose of continuity.)

## 2011-01-30 NOTE — Progress Notes (Addendum)
Patient ID: Ricky Bradley, male   DOB: 1933/09/06, 77 y.o.   MRN: [id number] Patient ID: Ricky Bradley, male   DOB: December 29, 1933, 77 y.o.   MRN: [id number] Subjective/Complaints: Fatigued from therapy today Review of Systems  HENT: Positive for neck pain.   Musculoskeletal: Positive for myalgias and back pain.       Couldn't find a comfortable position in bed  All other systems reviewed and are negative.   Objective: Vital Signs: Blood pressure 123/81, pulse 88, temperature 97.7 F (36.5 C), temperature source Oral, resp. rate 22, SpO2 99.00%. No results found. Results for orders placed during the hospital encounter of 01/27/11 (from the past 72 hour(s))  GLUCOSE, CAPILLARY     Status: Abnormal   Collection Time   01/27/11  5:31 PM      Component Value Range Comment   Glucose-Capillary 141 (*) 70 - 99 (mg/dL)    Comment 1 Notify RN     GLUCOSE, CAPILLARY     Status: Abnormal   Collection Time   01/27/11  8:25 PM      Component Value Range Comment   Glucose-Capillary 222 (*) 70 - 99 (mg/dL)    Comment 1 Notify RN     GLUCOSE, CAPILLARY     Status: Abnormal   Collection Time   01/28/11  7:38 AM      Component Value Range Comment   Glucose-Capillary 170 (*) 70 - 99 (mg/dL)    Comment 1 Notify RN     GLUCOSE, CAPILLARY     Status: Abnormal   Collection Time   01/28/11 11:57 AM      Component Value Range Comment   Glucose-Capillary 193 (*) 70 - 99 (mg/dL)    Comment 1 Notify RN     GLUCOSE, CAPILLARY     Status: Abnormal   Collection Time   01/28/11  4:20 PM      Component Value Range Comment   Glucose-Capillary 209 (*) 70 - 99 (mg/dL)    Comment 1 Notify RN     GLUCOSE, CAPILLARY     Status: Abnormal   Collection Time   01/28/11 10:33 PM      Component Value Range Comment   Glucose-Capillary 224 (*) 70 - 99 (mg/dL)   GLUCOSE, CAPILLARY     Status: Abnormal   Collection Time   01/29/11  7:27 AM      Component Value Range Comment   Glucose-Capillary 191 (*) 70 - 99 (mg/dL)      Comment 1 Notify RN     GLUCOSE, CAPILLARY     Status: Abnormal   Collection Time   01/29/11 12:02 PM      Component Value Range Comment   Glucose-Capillary 184 (*) 70 - 99 (mg/dL)    Comment 1 Notify RN     GLUCOSE, CAPILLARY     Status: Abnormal   Collection Time   01/29/11  4:39 PM      Component Value Range Comment   Glucose-Capillary 198 (*) 70 - 99 (mg/dL)    Comment 1 Notify RN     GLUCOSE, CAPILLARY     Status: Abnormal   Collection Time   01/29/11  8:56 PM      Component Value Range Comment   Glucose-Capillary 263 (*) 70 - 99 (mg/dL)   CBC     Status: Abnormal   Collection Time   01/30/11  6:00 AM      Component Value Range Comment   WBC 12.6 (*)  4.0 - 10.5 (K/uL)    RBC 4.78  4.22 - 5.81 (MIL/uL)    Hemoglobin 13.8  13.0 - 17.0 (g/dL)    HCT 16.1  09.6 - 04.5 (%)    MCV 84.5  78.0 - 100.0 (fL)    MCH 28.9  26.0 - 34.0 (pg)    MCHC 34.2  30.0 - 36.0 (g/dL)    RDW 40.9  81.1 - 91.4 (%)    Platelets 80 (*) 150 - 400 (K/uL)   COMPREHENSIVE METABOLIC PANEL     Status: Abnormal   Collection Time   01/30/11  6:00 AM      Component Value Range Comment   Sodium 132 (*) 135 - 145 (mEq/L)    Potassium 4.1  3.5 - 5.1 (mEq/L)    Chloride 96  96 - 112 (mEq/L)    CO2 28  19 - 32 (mEq/L)    Glucose, Bld 166 (*) 70 - 99 (mg/dL)    BUN 19  6 - 23 (mg/dL)    Creatinine, Ser 7.82  0.50 - 1.35 (mg/dL)    Calcium 8.4  8.4 - 10.5 (mg/dL)    Total Protein 5.2 (*) 6.0 - 8.3 (g/dL)    Albumin 2.6 (*) 3.5 - 5.2 (g/dL)    AST 10  0 - 37 (U/L)    ALT 20  0 - 53 (U/L)    Alkaline Phosphatase 67  39 - 117 (U/L)    Total Bilirubin 0.5  0.3 - 1.2 (mg/dL)    GFR calc non Af Amer >90  >90 (mL/min)    GFR calc Af Amer >90  >90 (mL/min)   DIFFERENTIAL     Status: Abnormal   Collection Time   01/30/11  6:00 AM      Component Value Range Comment   Neutrophils Relative 89 (*) 43 - 77 (%)    Neutro Abs 11.2 (*) 1.7 - 7.7 (K/uL)    Lymphocytes Relative 4 (*) 12 - 46 (%)    Lymphs Abs 0.5  (*) 0.7 - 4.0 (K/uL)    Monocytes Relative 6  3 - 12 (%)    Monocytes Absolute 0.8  0.1 - 1.0 (K/uL)    Eosinophils Relative 1  0 - 5 (%)    Eosinophils Absolute 0.1  0.0 - 0.7 (K/uL)    Basophils Relative 0  0 - 1 (%)    Basophils Absolute 0.0  0.0 - 0.1 (K/uL)   GLUCOSE, CAPILLARY     Status: Abnormal   Collection Time   01/30/11  7:23 AM      Component Value Range Comment   Glucose-Capillary 212 (*) 70 - 99 (mg/dL)    Comment 1 Notify RN     GLUCOSE, CAPILLARY     Status: Abnormal   Collection Time   01/30/11 11:24 AM      Component Value Range Comment   Glucose-Capillary 171 (*) 70 - 99 (mg/dL)    Comment 1 Notify RN      Physical Exam  Constitutional: He is oriented to person, place, and time. Appears fatigued HENT:  Head: Normocephalic.  Neck:  Collar in place  Cardiovascular: Normal rate.  Pulmonary/Chest: Breath sounds normal. He has no wheezes.  Abdominal: He exhibits no distension. There is no tenderness.  Musculoskeletal: He exhibits no edema.  Neurological: He is alert and oriented to person, place, and time. A sensory deficit is present. Coordination reduced in BUEs Patient with 4/5 strength in both upper extremities grossly except 2  to 3-/5 grip. Uses tenodesis grip. He had some diminishment of his fine touch in the hands right more than left. In the lower extremities strength was tr/5 proximally at the right hip flexors 1-tr at the right knee extensors and flexors and to tr-1/5 right at the ankle dorsiflexors and plantar flexors. Left leg 1-1+/5 generally. He had some diminishment of his leg sensation at 1 plus out of 2. Fine motor coordination of the upper extremities was fair. Cognitively he was alert and oriented x 3 with extra time, but was slow to process . CN exam unremarkable 2-12. DTR's 3+ in lower ext's and resting tone at knees and ankles of 1+ /4.  Skin: Surgical site clean. C-collar fitting appropriately. Erythema and mild breakdown at sacrum with EPBC in  place Psychiatric: Affect was flat but he was generally pleasant.    Assessment/Plan: 1. Functional deficits secondary to Cervical myelopathy causing tetraplegia C7 ASIA B which require 3+ hours per day of interdisciplinary therapy in a comprehensive inpatient rehab setting. Physiatrist is providing close team supervision and 24 hour management of active medical problems listed below. Physiatrist and rehab team continue to assess barriers to discharge/monitor patient progress toward functional and medical goals. Mobility: Bed Mobility Bed Mobility: Yes Rolling Right: 2: Max assist Rolling Right Details (indicate cue type and reason): Cues for UE use, A for bil LEs Rolling Left: 2: Max assist Rolling Left Details (indicate cue type and reason): Cues for UE use, A for bil LEs Left Sidelying to Sit: 1: +1 Total assist;HOB flat Left Sidelying to Sit Details (indicate cue type and reason): Pt able to A with bil UEs, but A needed for trunk and LEs Sitting - Scoot to Edge of Bed: 2: Max assist Sitting - Scoot to [place] of Bed Details (indicate cue type and reason): Cues for hand placement and timing, A for anterior translation at hips Sit to Supine - Right: 1: +2 Total assist;HOB flat Scooting to HOB: 1: +2 Total assist Scooting to [facility] Details (indicate cue type and reason): A to position LEs in hooklying and cues to lift hips. A for scooting Transfers Transfers: Yes Sit to Stand: 1: +2 Total assist;With upper extremity assist;From bed Sit to Stand Details (indicate cue type and reason): A needed for lifting and lowering. Pt unable to clear hps from bed without +2 A. Tried Sara Plus, but increased thoracic pain, so stopped.  Stand to Sit: 1: +2 Total assist;With upper extremity assist;To bed Ambulation/Gait Ambulation/Gait Assistance: Not tested (comment) (Pt pre-gait at this time) Stairs: No Wheelchair Mobility Wheelchair Mobility: No Distance: 40  ADL:    Cognition: Cognition Overall  Cognitive Status: Appears within functional limits for tasks assessed Arousal/Alertness: Awake/alert Orientation Level: Oriented X4 Attention: Sustained Sustained Attention: Appears intact Memory: Appears intact Awareness: Appears intact Problem Solving: Appears intact Safety/Judgment: Appears intact Cognition Arousal/Alertness: Awake/alert Orientation Level: Oriented X4  2. Anticoagulation/DVT prophylaxis with SCDs 3. Pain Management: Oxycodone and Tylenol. Fair control at present.  Medical problem list  1.Cervical Myelopathy-S/Pcervical diskectomy with decompression 12/3.Cervical collar at all times.Decadron taper.  2. DVT Prophylaxis/Anticoagulation: SCD/support hose.No current signs of DVT. Dopplers are negative.   3. Pain Management: Oxycodone/robaxin.Monitor with increased activity.    4. Neurogenic bladder and hematuria/BPH-.Foley tube in place with hematuria resolving.Continue flomax for now.Empiric keflex 12/7 and plan follow up urine study.Will discuss voiding trial with urology services. Needs to keep foley in regardless to protect skin.   5.Steriod induced hyperglycemia-check blood sugars ac/hs and monitor as taper  steroids.   6.Depression-celexa daily.Provide emotional support.   7.GERD-protonix   8.Hyperlipidemia-zocor  3  Kyzer Blowe T 01/30/2011, 11:51 AM

## 2011-01-30 NOTE — Progress Notes (Signed)
Pt alert/oriented mostly, confused at times, bed alarm on and quick release when up OOB , incision with steri strips to anterior left neck, aspen collar on at all times, limited ROM to bilateral upper extremities, able to wiggle toes and move fingers, set up for meals to open containers, heels red, elevate up to pillows and turn Q 2 while in bed, groin/preineal area with red, using micro guard powder.  Pt has long-tern indwelling foley.  Pt complains of stomach pain most likely related to constipation.  Attempting to start bowel regimen today with BID Mira lax and bisacodyl suppository.  Pt continues to have small formed stools or smears multiple times a day with no significant improvement of abdomenal pain, will continue with plan of care

## 2011-01-30 NOTE — Progress Notes (Signed)
Occupational Therapy Session Note  Patient Details  Name: Ricky Bradley MRN: [id number] Date of Birth: 1933-04-12  Today's Date: 01/30/2011 Time: 4098-1191 Time Calculation (min): 40 min  Precautions: Precautions Precautions: Fall Precaution Comments: cervical precautions Required Braces or Orthoses: Yes Cervical Brace: Hard collar;Applied in supine position Restrictions Weight Bearing Restrictions: No  Short Term Goals: OT Short Term Goal 1: Pt. will be minimal assist with feeding self OT Short Term Goal 2: Pt. will be be set up with UB bathing OT Short Term Goal 3: Pt.  will verbalize strategies for pressur relief with minimal cues OT Short Term Goal 4: Pt.  will sit up for 2 hours daily in chair  Skilled Therapeutic Interventions/Progress Updates:    Upon entering room patient seated in w/c. Patient with complaints of burning around rectum and stated he thought he had a bowel movement. Transferred back to bed with total assist x3 using slide board. Engaged in bed mobility back to bed to assist with perineal hygiene. Patient positioned on side secondary to skin breakdown around rectum. Discussed fine motor and gross tasks with hands during down time for strengthening. Also encouraged patient to get back up in w/c for self feeding during dinner. Patient stated he was "exhausted" at end of session.     Pain Patient complained of pain, no rate given. RN aware.  Therapy/Group: Individual Therapy  Ziyonna Christner 01/30/2011, 3:12 PM

## 2011-01-30 NOTE — Progress Notes (Signed)
Physical Therapy Note  Patient Details  Name: DAVINDER HAFF MRN: [id number] Date of Birth: October 16, 1933 Today's Date: 01/30/2011  1045-1110 (25 minutes) individual therapy Pain: 4/10 pain cervical- pain meds given; c/o of "burning" sensation" buttocks ( pt given suppository, some stooling) Pt in Rt sidelying to allow air to buttocks. Performed PROM bilateral hips, knees, ankles in sidelying/ no active movement noted Rolling - max assist RT or LT with cues to use bedrails/max assist for hygiene   Lilias Lorensen,JIM 01/30/2011, 11:12 AM

## 2011-01-30 NOTE — Progress Notes (Signed)
Occupational Therapy Session Note  Patient Details  Name: Ricky Bradley MRN: [id number] Date of Birth: 07-19-1933  Today's Date: 01/30/2011 Time: 9629-5284 Time Calculation (min): 55 min  Precautions: Precautions Precautions: Fall Precaution Comments: cervical precautions Required Braces or Orthoses: Yes Cervical Brace: Hard collar;Applied in supine position Restrictions Weight Bearing Restrictions: No  Short Term Goals: OT Short Term Goal 1: Pt. will be minimal assist with feeding self OT Short Term Goal 2: Pt. will be be set up with UB bathing OT Short Term Goal 3: Pt.  will verbalize strategies for pressur relief with minimal cues OT Short Term Goal 4: Pt.  will sit up for 2 hours daily in chair  Skilled Therapeutic Interventions/Progress Updates:    Engaged in ADL retraining at bed level. Focused skilled intervention on bed mobility (rolling right & left), bed mobility body mechanics for effective rolling & to increase independence, UB bathing&dressing, therapeutic activity of threading belt onto pants for functional use of bilateral hands, LB dressing, and increasing overall activity tolerance/endurance. Patient with incontinent BM in brief. Patient with complaints of "buringin around my rectum". RN notified.   Pain No complaints of pain  Therapy/Group: Individual Therapy  Odaly Peri 01/30/2011, 8:50 AM

## 2011-01-30 NOTE — Progress Notes (Addendum)
Physical Therapy Note  Patient Details  Name: Ricky Bradley MRN: [id number] Date of Birth: 09/22/1933 Today's Date: 01/30/2011  TIME IN/OUT 1300-1400 Individual therapy  Pain- denies but states bottom has been sore in past/education on skin care and bed positioning to protect skin; needs reinforcement  Skilled intervention: Therapeutic activity: transfer training - total assist for supine to sit (pt = 20%) with instructional cues to L, roll with rail mod-max A bed flat; slide board bed to w/c and w/c to and from mat with total A +2 pt +15% level surfaces with instructional cues for technique- +2 was for safety d/t c/o dizziness and poor potural control (BP systolic 138 initial sitting with most dizziness, 103 with least dizziness- TED hose on);  sitting balance training with up to max assist for static sitting with UE's ER and behind pt, worked on improving ability to control COG and weight shift- frequent rest breaks, postural  Control limited by kyphosis and limited head movement in C collar W/C propulsion training 55 ft with c/o fatigue and instructional cues and min a to turn  Pt reporting he is unclear about his gluten free diet and doesn't know if or why he was on it prior, also states he is not clear on premorbid status,question cognitive deficits  Michaelene Song 01/30/2011, 3:21 PM

## 2011-01-31 DIAGNOSIS — M4712 Other spondylosis with myelopathy, cervical region: Secondary | ICD-10-CM

## 2011-01-31 DIAGNOSIS — G8250 Quadriplegia, unspecified: Secondary | ICD-10-CM

## 2011-01-31 DIAGNOSIS — N319 Neuromuscular dysfunction of bladder, unspecified: Secondary | ICD-10-CM

## 2011-01-31 DIAGNOSIS — Z5189 Encounter for other specified aftercare: Secondary | ICD-10-CM

## 2011-01-31 DIAGNOSIS — K592 Neurogenic bowel, not elsewhere classified: Secondary | ICD-10-CM

## 2011-01-31 LAB — GLUCOSE, CAPILLARY
Glucose-Capillary: 202 mg/dL — ABNORMAL HIGH (ref 70–99)
Glucose-Capillary: 178 mg/dL — ABNORMAL HIGH (ref 70–99)

## 2011-01-31 NOTE — Progress Notes (Signed)
Social Work  Doctor, hospital with pt's daughter, Noreene Larsson, following conference and told her of concerns that pt's goals (mod Assist) may be problematic for care at home if wife can only provide supervision.  Daughter plans to meet with me on Thursday @ 3:00 to review options of home care.  She acknowledges that SNF may have to be the plan, but we will look at their long term care policy to see amount of home care coverage it has first.  Will keep team posted on discharge plan.  Herschell Virani

## 2011-01-31 NOTE — Progress Notes (Signed)
Social Work Assessment and Plan Assessment and Plan  Patient Name: Ricky Bradley  NFAOZ'H Date: 01/31/2011  Problem List:  Patient Active Problem List  Diagnoses  . HTN (hypertension)  . Dyslipidemia  . BPH (benign prostatic hyperplasia)  . GERD (gastroesophageal reflux disease)  . Lumbar stenosis with neurogenic claudication  . Cord compression  . Irritable bowel syndrome (IBS)  . Gross hematuria  . Myelopathy    Past Medical History:  Past Medical History  Diagnosis Date  . Hypertension   . GERD (gastroesophageal reflux disease)   . High cholesterol   . Enlarged prostate   . High cholesterol     Past Surgical History:  Past Surgical History  Procedure Date  . Joint replacement 2001  and 2002    both knees  . Laminectomy 08/2010  . Back surgery   . Replacement total knee   . Cholecystectomy 11/01/10  . Anterior cervical decomp/discectomy fusion 01/16/2011    Procedure: ANTERIOR CERVICAL DECOMPRESSION/DISCECTOMY FUSION 1 LEVEL;  Surgeon: Cristi Loron;  Location: [facility];  Service: Neurosurgery;  Laterality: N/A;  Cervical six-seven  Anterior Cervical Decomp[ression Fusion  . Anterior cervical decomp/discectomy fusion 01/23/2011    Procedure: ANTERIOR CERVICAL DECOMPRESSION/DISCECTOMY FUSION 1 LEVEL/HARDWARE REMOVAL;  Surgeon: Cristi Loron;  Location: [facility];  Service: Neurosurgery;  Laterality: N/A;  Evacuation of Cervical Six-Seven Hematoma    Discharge Planning  Discharge Planning Support Systems: Children Expected Discharge Date:  (TBD) Case Management Consult Needed: Yes (Comment) (already following)  Social/Family/Support Systems Social/Family/Support Systems Anticipated Caregiver: additional support from daughter, Noreene Larsson and son, Tresa Endo. Daughter lives and works out of town.  Son also works f/t. Anticipated Caregiver's Contact Information: Daughter requests that we contact her first vs. mother "because it makes her nervous" - dtr's #  [phone] Ability/Limitations of Caregiver: wife cannot provide any physical assistance;  adult children work f/t Caregiver Availability:  (supervision ONLY)  Employment Status Employment Status Employment Status: Retired Date Retired/Disabled/Unemployed: Arts administrator Issues: none Guardian/Conservator: none  Abuse/Neglect    Emotional Status Emotional Status Pt's affect, behavior adn adjustment status: elderly gentleman lying in bed with cap on and very soft-spoken;  able to answer questions appropriately yet is not very forthcoming with information.  Does point out that "my wife can do anything for me".  Denies any s/s depression with screening not indicating any depression symptons either.  Will monitor as his awareness of limitations increases. Recent Psychosocial Issues: none Pyschiatric History: none  Patient/Family Perceptions, Expectations & Goals Pt/Family Perceptions, Expectations and Goals Pt/Family understanding of illness & functional limitations: Pt and family with very basic understanding of surgery performed as well as complications leading to additional surgery.  General awareness of functional limitations, yet, none with any ideas about what to anticipate for functional return and care needs at discharge. 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; adult children providing intermittent assist Anticipated changes in roles/activities/participation: wife and family will all need to assume significant caregiver duties/ roles if pt is to return home Pt/family expectations/goals: "I don't know"  (pt)  Radio producer Agencies: None Premorbid Home Care/DME Agencies: None Transportation available at discharge: yes Resource referrals recommended: Psychology;Advocacy groups;Guardian/conservator  Discharge Assessment Discharge Planning Insurance Resources:  Medicare (also has private longterm care policy) Financial Resources: Social Security Financial Screen Referred: No Living Expenses: Own Money Management: Patient Home Management: pt and wife Patient/Family Preliminary Plans: daughter states they "would love  o avoid a nursing home" yet admits he may need more assistance than family can provide.  Home is plan dependent on progress vs SNF Barriers to Discharge: Family Support (wife cannot provide beyond supervision ) DC Planning Additional Notes/Comments: will meet with family to look at LTC policy and discuss options further after team conference  Clinical Impression:  Pleasant, oriented, elderly gentleman lying in bed.  Soft spoken and expressing concern that wife cannot provide him any physical assistance.  Limited understanding/ appreciation of extent of spinal damage and likely limitations on the long term.  Will monitor emotional adjustment as realizations begin to emerge.      Amada Jupiter 01/31/2011

## 2011-01-31 NOTE — Progress Notes (Signed)
Occupational Therapy Session Note  Patient Details  Name: Ricky Bradley MRN: [id number] Date of Birth: December 28, 1933  Today's Date: 01/31/2011 Time: 7829-5621 Time Calculation (min): 45 min  Precautions: Precautions Precautions: Fall Precaution Comments: cervical precautions Required Braces or Orthoses: Yes Cervical Brace: Hard collar;Applied in supine position Restrictions Weight Bearing Restrictions: No  Short Term Goals: OT Short Term Goal 1: Pt. will be minimal assist with feeding self OT Short Term Goal 2: Pt. will be be set up with UB bathing OT Short Term Goal 3: Pt.  will verbalize strategies for pressur relief with minimal cues OT Short Term Goal 4: Pt.  will sit up for 2 hours daily in chair  Skilled Therapeutic Interventions/Progress Updates:    Focus on bed mobility including rolling side to side with use of bed rails and sitting from supine to EOB.  Supine to sit at total A.  Sitting EOB with mod assist.  Sliding board transfer to w/c with total + 2 (pt=<25%).    Pain Pain Assessment Pain Assessment: No/denies pain    Other Treatments    Therapy/Group: Individual Therapy  [name] 01/31/2011, 3:12 PM

## 2011-01-31 NOTE — Progress Notes (Signed)
Physical Therapy Note  Patient Details  Name: Ricky Bradley MRN: [id number] Date of Birth: August 26, 1933 Today's Date: 01/31/2011  13:00- 13:30=  Individual therapy pt denies pain. Session spent changing wc to ;tilt n space wheel chair to aid pressure relief and OOB tolerance.   [name] 01/31/2011, 3:01 PM

## 2011-01-31 NOTE — Progress Notes (Addendum)
Patient ID: Ricky Bradley, male   DOB: [birth year], 77 y.o.   MRN: [id number] Subjective/Complaints: Slept well last night.  No complaints. Prefers am bowel program Review of Systems  HENT: Positive for neck pain.   Musculoskeletal: Positive for myalgias and back pain.       Couldn't find a comfortable position in bed  All other systems reviewed and are negative.   Objective: Vital Signs: Blood pressure 110/66, pulse 96, temperature 97.7 F (36.5 C), temperature source Oral, resp. rate 20, SpO2 96.00%. No results found. Results for orders placed during the hospital encounter of 01/27/11 (from the past 72 hour(s))  GLUCOSE, CAPILLARY     Status: Abnormal   Collection Time   01/28/11  7:38 AM      Component Value Range Comment   Glucose-Capillary 170 (*) 70 - 99 (mg/dL)    Comment 1 Notify RN     GLUCOSE, CAPILLARY     Status: Abnormal   Collection Time   01/28/11 11:57 AM      Component Value Range Comment   Glucose-Capillary 193 (*) 70 - 99 (mg/dL)    Comment 1 Notify RN     GLUCOSE, CAPILLARY     Status: Abnormal   Collection Time   01/28/11  4:20 PM      Component Value Range Comment   Glucose-Capillary 209 (*) 70 - 99 (mg/dL)    Comment 1 Notify RN     GLUCOSE, CAPILLARY     Status: Abnormal   Collection Time   01/28/11 10:33 PM      Component Value Range Comment   Glucose-Capillary 224 (*) 70 - 99 (mg/dL)   GLUCOSE, CAPILLARY     Status: Abnormal   Collection Time   01/29/11  7:27 AM      Component Value Range Comment   Glucose-Capillary 191 (*) 70 - 99 (mg/dL)    Comment 1 Notify RN     GLUCOSE, CAPILLARY     Status: Abnormal   Collection Time   01/29/11 12:02 PM      Component Value Range Comment   Glucose-Capillary 184 (*) 70 - 99 (mg/dL)    Comment 1 Notify RN     GLUCOSE, CAPILLARY     Status: Abnormal   Collection Time   01/29/11  4:39 PM      Component Value Range Comment   Glucose-Capillary 198 (*) 70 - 99 (mg/dL)    Comment 1 Notify RN     GLUCOSE,  CAPILLARY     Status: Abnormal   Collection Time   01/29/11  8:56 PM      Component Value Range Comment   Glucose-Capillary 263 (*) 70 - 99 (mg/dL)   CBC     Status: Abnormal   Collection Time   01/30/11  6:00 AM      Component Value Range Comment   WBC 12.6 (*) 4.0 - 10.5 (K/uL)    RBC 4.78  4.22 - 5.81 (MIL/uL)    Hemoglobin 13.8  13.0 - 17.0 (g/dL)    HCT 14.7  82.9 - 56.2 (%)    MCV 84.5  78.0 - 100.0 (fL)    MCH 28.9  26.0 - 34.0 (pg)    MCHC 34.2  30.0 - 36.0 (g/dL)    RDW 13.0  86.5 - 78.4 (%)    Platelets 80 (*) 150 - 400 (K/uL)   COMPREHENSIVE METABOLIC PANEL     Status: Abnormal   Collection Time   01/30/11  6:00  AM      Component Value Range Comment   Sodium 132 (*) 135 - 145 (mEq/L)    Potassium 4.1  3.5 - 5.1 (mEq/L)    Chloride 96  96 - 112 (mEq/L)    CO2 28  19 - 32 (mEq/L)    Glucose, Bld 166 (*) 70 - 99 (mg/dL)    BUN 19  6 - 23 (mg/dL)    Creatinine, Ser 1.19  0.50 - 1.35 (mg/dL)    Calcium 8.4  8.4 - 10.5 (mg/dL)    Total Protein 5.2 (*) 6.0 - 8.3 (g/dL)    Albumin 2.6 (*) 3.5 - 5.2 (g/dL)    AST 10  0 - 37 (U/L)    ALT 20  0 - 53 (U/L)    Alkaline Phosphatase 67  39 - 117 (U/L)    Total Bilirubin 0.5  0.3 - 1.2 (mg/dL)    GFR calc non Af Amer >90  >90 (mL/min)    GFR calc Af Amer >90  >90 (mL/min)   DIFFERENTIAL     Status: Abnormal   Collection Time   01/30/11  6:00 AM      Component Value Range Comment   Neutrophils Relative 89 (*) 43 - 77 (%)    Neutro Abs 11.2 (*) 1.7 - 7.7 (K/uL)    Lymphocytes Relative 4 (*) 12 - 46 (%)    Lymphs Abs 0.5 (*) 0.7 - 4.0 (K/uL)    Monocytes Relative 6  3 - 12 (%)    Monocytes Absolute 0.8  0.1 - 1.0 (K/uL)    Eosinophils Relative 1  0 - 5 (%)    Eosinophils Absolute 0.1  0.0 - 0.7 (K/uL)    Basophils Relative 0  0 - 1 (%)    Basophils Absolute 0.0  0.0 - 0.1 (K/uL)   GLUCOSE, CAPILLARY     Status: Abnormal   Collection Time   01/30/11  7:23 AM      Component Value Range Comment   Glucose-Capillary 212 (*)  70 - 99 (mg/dL)    Comment 1 Notify RN     GLUCOSE, CAPILLARY     Status: Abnormal   Collection Time   01/30/11 11:24 AM      Component Value Range Comment   Glucose-Capillary 171 (*) 70 - 99 (mg/dL)    Comment 1 Notify RN     GLUCOSE, CAPILLARY     Status: Abnormal   Collection Time   01/30/11  5:11 PM      Component Value Range Comment   Glucose-Capillary 147 (*) 70 - 99 (mg/dL)   GLUCOSE, CAPILLARY     Status: Abnormal   Collection Time   01/30/11  8:36 PM      Component Value Range Comment   Glucose-Capillary 181 (*) 70 - 99 (mg/dL)    Comment 1 Notify RN      Physical Exam  Constitutional: He is oriented to person, place, and time. Appears fatigued HENT:  Head: Normocephalic.  Neck:  Collar in place  Cardiovascular: Normal rate.  Pulmonary/Chest: Breath sounds normal. He has no wheezes.  Abdominal: He exhibits no distension. There is no tenderness.  Musculoskeletal: He exhibits no edema.  Neurological: He is alert and oriented to person, place, and time. A sensory deficit is present. Coordination reduced in BUEs Patient with 4/5 strength in both upper extremities grossly except 2 to 3-/5 grip. Uses tenodesis grip. He had some diminishment of his fine touch in the hands right more  than left. In the lower extremities strength was tr/5 proximally at the right hip flexors 1-tr at the right knee extensors and flexors and to tr-1/5 right at the ankle dorsiflexors and plantar flexors. Left leg 1-1+/5 generally. He had some diminishment of his leg sensation at 1 plus out of 2. Fine motor coordination of the upper extremities was fair. Cognitively he was alert and oriented x 3 with extra time, but was slow to process . CN exam unremarkable 2-12. DTR's 3+ in lower ext's and resting tone at knees and ankles of 1+ /4.  Skin: Surgical site clean. C-collar fitting appropriately. Erythema and mild breakdown at sacrum with EPBC in place Psychiatric: Affect was flat but he was generally  pleasant.    Assessment/Plan: 1. Functional deficits secondary to Cervical myelopathy causing tetraplegia C7 ASIA B which require 3+ hours per day of interdisciplinary therapy in a comprehensive inpatient rehab setting. Physiatrist is providing close team supervision and 24 hour management of active medical problems listed below. Physiatrist and rehab team continue to assess barriers to discharge/monitor patient progress toward functional and medical goals. Mobility: Bed Mobility Bed Mobility: Yes Rolling Right: 2: Max assist Rolling Right Details (indicate cue type and reason): Cues for UE use, A for bil LEs Rolling Left: 2: Max assist Rolling Left Details (indicate cue type and reason): Cues for UE use, A for bil LEs Left Sidelying to Sit: 1: +1 Total assist;HOB flat Left Sidelying to Sit Details (indicate cue type and reason): Pt able to A with bil UEs, but A needed for trunk and LEs Sitting - Scoot to Edge of Bed: 2: Max assist Sitting - Scoot to [place] of Bed Details (indicate cue type and reason): Cues for hand placement and timing, A for anterior translation at hips Sit to Supine - Right: 1: +2 Total assist;HOB flat Scooting to HOB: 1: +2 Total assist Scooting to [facility] Details (indicate cue type and reason): A to position LEs in hooklying and cues to lift hips. A for scooting Transfers Transfers: Yes Sit to Stand: 1: +2 Total assist;With upper extremity assist;From bed Sit to Stand Details (indicate cue type and reason): A needed for lifting and lowering. Pt unable to clear hps from bed without +2 A. Tried Sara Plus, but increased thoracic pain, so stopped.  Stand to Sit: 1: +2 Total assist;With upper extremity assist;To bed Ambulation/Gait Ambulation/Gait Assistance: Not tested (comment) Stairs: No Wheelchair Mobility Wheelchair Mobility: No Distance: 40  ADL:    Cognition: Cognition Overall Cognitive Status: Appears within functional limits for tasks  assessed Arousal/Alertness: Awake/alert Orientation Level: Oriented to person;Oriented to place;Oriented to situation Attention: Sustained Sustained Attention: Appears intact Memory: Appears intact Awareness: Appears intact Problem Solving: Appears intact Safety/Judgment: Appears intact Cognition Arousal/Alertness: Awake/alert Orientation Level: Oriented to person;Oriented to place;Oriented to situation  2. Anticoagulation/DVT prophylaxis with SCDs 3. Pain Management: Oxycodone and Tylenol. Fair control at present.  Medical problem list  1.Cervical Myelopathy-S/Pcervical diskectomy with decompression 12/3.Cervical collar at all times.Decadron taper.  2. DVT Prophylaxis/Anticoagulation: SCD/support hose.No current signs of DVT. Dopplers are negative.   3. Pain Management: Oxycodone/robaxin.Monitor with increased activity.    4. Neurogenic bladder and hematuria/BPH-.Foley tube in place with hematuria resolving.Continue flomax for now.Empiric keflex 12/7 and plan follow up urine study.Will discuss voiding trial with urology services. Needs to keep foley in regardless to protect skin also  -pt wants am bowel  program   5.Steriod induced hyperglycemia-check blood sugars ac/hs and monitor as taper steroids.   6.Depression-celexa daily.Provide emotional  support.   7.GERD-protonix   8.Hyperlipidemia-zocor  4  Aranda Bihm T 01/31/2011, 6:44 AM

## 2011-01-31 NOTE — Progress Notes (Signed)
Occupational Therapy Session Note  Patient Details  Name: Ricky Bradley MRN: [id number] Date of Birth: Jun 18, 1933  Today's Date: 01/31/2011 Time: 0700-0800 Time Calculation (min): 60 min  Precautions: Precautions Precautions: Fall Precaution Comments: cervical precautions Required Braces or Orthoses: Yes Cervical Brace: Hard collar;Applied in supine position Restrictions Weight Bearing Restrictions:  (poor bed mobility/turns in bed w/ staff assist/cerv.collar)  Short Term Goals: OT Short Term Goal 1: Pt. will be minimal assist with feeding self OT Short Term Goal 2: Pt. will be be set up with UB bathing OT Short Term Goal 3: Pt.  will verbalize strategies for pressur relief with minimal cues OT Short Term Goal 4: Pt.  will sit up for 2 hours daily in chair  Skilled Therapeutic Interventions/Progress Updates:    ADL retraining including sponge bath and dressing supine with HOB elevated.  Focus on bed mobility, activity tolerance, and increased UE use for bathing and dressing activities.  Pt tot assist for dressing activities and mod assist for bathing.  Pt requires mod assist for rolling in bed using bed rails.  Pt requires assist to position BLE to prepare for rolling. Pt requires assistance to button shirt.    Pain Pain Assessment Pain Assessment: No/denies pain Pain Score: 0-No pain  Therapy/Group: Individual Therapy  [name] 01/31/2011, 9:18 AM

## 2011-01-31 NOTE — Patient Care Conference (Signed)
Inpatient RehabilitationTeam Conference Note Date: 01/31/2011   Time: 1:55 PM    Patient Name: Ricky Bradley      Medical Record Number: [id number]  Date of Birth: [birth year] Sex: Male         Room/Bed: 4029/4029-01 Payor Info: Payor: MEDICARE  Plan: MEDICARE PART A AND B  Product Type: *No Product type*     Admitting Diagnosis: C6 C7 STENOSIS WITH MYELOPATHY  Admit Date/Time:  01/27/2011  3:38 PM Admission Comments: No comment available   Primary Diagnosis:  Myelopathy Principal Problem: Myelopathy  Patient Active Problem List  Diagnoses Date Noted  . Myelopathy 01/27/2011  . Gross hematuria 01/16/2011  . Cord compression 01/10/2011  . Irritable bowel syndrome (IBS) 01/10/2011  . HTN (hypertension) 01/09/2011  . Dyslipidemia 01/09/2011  . BPH (benign prostatic hyperplasia) 01/09/2011  . GERD (gastroesophageal reflux disease) 01/09/2011  . Lumbar stenosis with neurogenic claudication 01/09/2011    Expected Discharge Date:  4 weeks  Team Members Present: Physician: Dr. Faith Rogue Case Manager Present: Lutricia Horsfall, RN;Melanee Spry, RN Social Worker Present: Amada Jupiter, LCSW PT Present: Vincente Liberty, PTA OT Present: Edwin Cap, Loistine Chance, OT Other (Discipline and Name): PPS Coordinator: Tora Duck, RN     Current Status/Progress Goal Weekly Team Focus  Medical   postoperative hematoma with increased lower extremity weakness and spasms  improved lower extremity rom, skin care, bowel and bladder mgt  bowel program.  pain mgt. skin mgt   Bowel/Bladder   incontinent bowel and bladder lbm 12/18  min assist  bowel program 0800 qday   Swallow/Nutrition/ Hydration             ADL's   Overall Total Assist  Overall Supervision -> minimal assit  ADL retraining, activity tolerance, dynamic sitting balance, transfers   Mobility   2 person sliding board transfer  mod assist transfer min assist wc mobility  transfers and sitting balance   Communication       Safety/Cognition/ Behavioral Observations            Pain   no complaints of pain         Skin   redness to buttocks  no new breakdown  daily skin care regimen       *See Interdisciplinary Assessment and Plan and progress notes for long and short-term goals  Barriers to Discharge: substantial lower extremity weakenss and profound neurological deficits    Possible Resolutions to Barriers:  patient and caregiver ed with adaptive equipment and techniques, sci ed    Discharge Planning/Teaching Needs:  Home with wife who can only provide supervision - if needs physical assistance, may need to consider SNF?  Awaiting contact from pt's wife/ daughter at time of team conference.      Team Discussion:  Discussion of dx, hx, goals and d/c plan. Neurogenic B&B. Foley in place. Bowel program being established. Pt c/o abd pain -- feeling bloated. Pt needs q 30 min tilt in w/c for pressure relief. Skin on sacral area reddened. Team  concerned re: d/c plan. Anticipating possible NHP.  Revisions to Treatment Plan:  none   Continued Need for Acute Rehabilitation Level of Care: The patient requires daily medical management by a physician with specialized training in physical medicine and rehabilitation for the following conditions: Daily direction of a multidisciplinary physical rehabilitation program to ensure safe treatment while eliciting the highest outcome that is of practical value to the patient.: Yes Daily medical management of patient stability for increased activity during  participation in an intensive rehabilitation regime.: Yes Daily analysis of laboratory values and/or radiology reports with any subsequent need for medication adjustment of medical intervention for : Neurological problems;Post surgical problems;Other  Ricky Bradley 01/31/2011, 3:20 PM

## 2011-01-31 NOTE — Progress Notes (Signed)
Physical Therapy Note  Patient Details  Name: Ricky Bradley MRN: [id number] Date of Birth: [birth year] Today's Date: 01/31/2011  8:45- 10:05=  Individual therapy, pt states his stomach hurts and feels gassy.  Assisted pt. With bed to drop arm commode with sliding board 2 person assist pt. = 20%. bowel program initiated with nursing and pt. Continent of stool in [facility]. Lateral leans 5 times each mod to max assist to place pad and board with VC for how to keep balance. rolling both ways with rail max assist with pt. Being educated on directing care for someone to bend his knees. Side to sit 2 person assist, pt complained of 5/10 dizziness that subsided with time. Pt had ted hose on.  Pt educated on pressure ulcers and positioning in the bed.   [name] 01/31/2011, 12:39 PM

## 2011-01-31 NOTE — Progress Notes (Addendum)
Patient transfers 2+ max assist to right side with slide board for transfers between wheelchair and bed. Incontinent of  bowel in brief today. Medium loose BM in BSC after suppository and dig stim today. Foley catheter intact. Redness on buttocks, microguard powder and barrier cream applied. Patient repositioned to each side q 30 minutes. Complaints of gas pain and bloating. Patient given scheduled simethicone at lunch for gas. Continue with plan of care.

## 2011-02-01 ENCOUNTER — Inpatient Hospital Stay (HOSPITAL_COMMUNITY): Payer: Medicare Other

## 2011-02-01 MED ORDER — SIMETHICONE 40 MG/0.6ML PO SUSP
40.0000 mg | Freq: Four times a day (QID) | ORAL | Status: DC | PRN
  Administered 2011-02-06: 40 mg via ORAL
  Filled 2011-02-01 (×2): qty 0.6

## 2011-02-01 MED ORDER — LORATADINE 10 MG PO TABS
10.0000 mg | ORAL_TABLET | Freq: Every day | ORAL | Status: DC
  Administered 2011-02-01 – 2011-02-24 (×24): 10 mg via ORAL
  Filled 2011-02-01 (×29): qty 1

## 2011-02-01 NOTE — Progress Notes (Signed)
Physical Therapy Session Note  Patient Details  Name: Ricky Bradley MRN: [id number] Date of Birth: Nov 06, 1933  Today's Date: 02/01/2011 Time: 1130-1205 Time Calculation (min): 35 min  Precautions: Precautions Precautions: Fall Precaution Comments: cervical precautions Required Braces or Orthoses: Yes Cervical Brace: Hard collar;Applied in supine position Restrictions Weight Bearing Restrictions: No  Short Term Goals: PT Short Term Goal 1: Perform all bed mobility Max A  PT Short Term Goal 1 - Progress: Progressing toward goal PT Short Term Goal 2: Perform all functional transfers Max A  PT Short Term Goal 2 - Progress: Progressing toward goal PT Short Term Goal 3: Maintain static sitting balance with Min PT Short Term Goal 3 - Progress: Progressing toward goal PT Short Term Goal 4: Maintain static standing balance Mod A PT Short Term Goal 4 - Progress: Progressing toward goal PT Short Term Goal 5: WC mobility Mod A x 50' PT Short Term Goal 5 - Progress: Progressing toward goal  Pain Pain Assessment Pain Assessment: 0-10 Pain Score:   5 Pain Type: Acute pain Pain Location: Abdomen Pain Onset: On-going Patients Stated Pain Goal: 0 Pain Intervention(s): RN made aware  Other Treatments Treatments Therapeutic Activity: Secondary to significant sensory deficits discussed with patient importance of learning ways to peform self pressure relief; demonstrated to patient various ways to perform self pressure relief with anterior leans to bring elbows to knees and lateral leans; practiced anterior leans in w/c with patient bringing each UE to thigh with mod A for trunk control and balance, able to walk UE to knees and back up again; patient began to c/o dizziness with leans; elevated each LE and reclined patient until dizziness resolved; performed bilat anterior chest/pec stretches while adding in deep breaths and bilat scap retraction for increased thoracic extension and chest expansion;  adjusted w/c head rest to provide more head support when reclined.    Therapy/Group: Individual Therapy  Edman Circle Faucette 02/01/2011, 12:19 PM

## 2011-02-01 NOTE — Progress Notes (Signed)
Occupational Therapy Note  Patient Details  Name: Ricky Bradley MRN: [id number] Date of Birth: Jun 27, 1933 Today's Date: 02/01/2011  Session Note 1400 - 1430 - individual therapy Pt c/o discomfort not able to rate; RN made aware Pt engaged in therapeutic activity seated in w/c to challenge dynamic sitting balance while reaching for objects. Pt exhibiting increased BUE shoulder extension and grasp in right hand.   Session Note  [phone] - individual therapy Pt c/o discomfort unrated; RN aware Pt engaged in therapeutic activity seated in w/c with focus on grasping marker to complete writing tasks.  Focus on sitting balance and grasp to complete tasks.   [name] [facility] 02/01/2011, 4:06 PM

## 2011-02-01 NOTE — Progress Notes (Signed)
Patient transfers 2+ max assist to left side with slide board between bed and wheelchair. Urinary catheter intact. Suppository given this am. Patient had small soft BM incontinent in brief following suppository. Refused bid scheduled miralax this am. Continues to have small incontinent BMs throughout day. Redness to buttocks, 123 skin care set being used with toileting and dressing, and prn. Patient complains of abdominal pain, went to radiology at 0930. States prn oxy IR 5 mg relieves pain. Continue with plan of care.

## 2011-02-01 NOTE — Progress Notes (Addendum)
Patient ID: Ricky Bradley, male   DOB: 05/15/1933, 77 y.o.   MRN: [id number] Patient ID: Ricky Bradley, male   DOB: Apr 04, 1933, 77 y.o.   MRN: [id number] Subjective/Complaints: C/o abdominal bloating. Affecting sleep.  Review of Systems  HENT: Positive for neck pain.   Musculoskeletal: Positive for back pain.       Couldn't find a comfortable position in bed  All other systems reviewed and are negative.   Objective: Vital Signs: Blood pressure 112/72, pulse 85, temperature 97.3 F (36.3 C), temperature source Oral, resp. rate 18, SpO2 98.00%. No results found. Results for orders placed during the hospital encounter of 01/27/11 (from the past 72 hour(s))  GLUCOSE, CAPILLARY     Status: Abnormal   Collection Time   01/29/11 12:02 PM      Component Value Range Comment   Glucose-Capillary 184 (*) 70 - 99 (mg/dL)    Comment 1 Notify RN     GLUCOSE, CAPILLARY     Status: Abnormal   Collection Time   01/29/11  4:39 PM      Component Value Range Comment   Glucose-Capillary 198 (*) 70 - 99 (mg/dL)    Comment 1 Notify RN     GLUCOSE, CAPILLARY     Status: Abnormal   Collection Time   01/29/11  8:56 PM      Component Value Range Comment   Glucose-Capillary 263 (*) 70 - 99 (mg/dL)   CBC     Status: Abnormal   Collection Time   01/30/11  6:00 AM      Component Value Range Comment   WBC 12.6 (*) 4.0 - 10.5 (K/uL)    RBC 4.78  4.22 - 5.81 (MIL/uL)    Hemoglobin 13.8  13.0 - 17.0 (g/dL)    HCT 62.9  52.8 - 41.3 (%)    MCV 84.5  78.0 - 100.0 (fL)    MCH 28.9  26.0 - 34.0 (pg)    MCHC 34.2  30.0 - 36.0 (g/dL)    RDW 24.4  01.0 - 27.2 (%)    Platelets 80 (*) 150 - 400 (K/uL)   COMPREHENSIVE METABOLIC PANEL     Status: Abnormal   Collection Time   01/30/11  6:00 AM      Component Value Range Comment   Sodium 132 (*) 135 - 145 (mEq/L)    Potassium 4.1  3.5 - 5.1 (mEq/L)    Chloride 96  96 - 112 (mEq/L)    CO2 28  19 - 32 (mEq/L)    Glucose, Bld 166 (*) 70 - 99 (mg/dL)    BUN 19  6 - 23  (mg/dL)    Creatinine, Ser 5.36  0.50 - 1.35 (mg/dL)    Calcium 8.4  8.4 - 10.5 (mg/dL)    Total Protein 5.2 (*) 6.0 - 8.3 (g/dL)    Albumin 2.6 (*) 3.5 - 5.2 (g/dL)    AST 10  0 - 37 (U/L)    ALT 20  0 - 53 (U/L)    Alkaline Phosphatase 67  39 - 117 (U/L)    Total Bilirubin 0.5  0.3 - 1.2 (mg/dL)    GFR calc non Af Amer >90  >90 (mL/min)    GFR calc Af Amer >90  >90 (mL/min)   DIFFERENTIAL     Status: Abnormal   Collection Time   01/30/11  6:00 AM      Component Value Range Comment   Neutrophils Relative 89 (*) 43 - 77 (%)  Neutro Abs 11.2 (*) 1.7 - 7.7 (K/uL)    Lymphocytes Relative 4 (*) 12 - 46 (%)    Lymphs Abs 0.5 (*) 0.7 - 4.0 (K/uL)    Monocytes Relative 6  3 - 12 (%)    Monocytes Absolute 0.8  0.1 - 1.0 (K/uL)    Eosinophils Relative 1  0 - 5 (%)    Eosinophils Absolute 0.1  0.0 - 0.7 (K/uL)    Basophils Relative 0  0 - 1 (%)    Basophils Absolute 0.0  0.0 - 0.1 (K/uL)   GLUCOSE, CAPILLARY     Status: Abnormal   Collection Time   01/30/11  7:23 AM      Component Value Range Comment   Glucose-Capillary 212 (*) 70 - 99 (mg/dL)    Comment 1 Notify RN     GLUCOSE, CAPILLARY     Status: Abnormal   Collection Time   01/30/11 11:24 AM      Component Value Range Comment   Glucose-Capillary 171 (*) 70 - 99 (mg/dL)    Comment 1 Notify RN     GLUCOSE, CAPILLARY     Status: Abnormal   Collection Time   01/30/11  5:11 PM      Component Value Range Comment   Glucose-Capillary 147 (*) 70 - 99 (mg/dL)   GLUCOSE, CAPILLARY     Status: Abnormal   Collection Time   01/30/11  8:36 PM      Component Value Range Comment   Glucose-Capillary 181 (*) 70 - 99 (mg/dL)    Comment 1 Notify RN     GLUCOSE, CAPILLARY     Status: Abnormal   Collection Time   01/31/11  7:56 AM      Component Value Range Comment   Glucose-Capillary 140 (*) 70 - 99 (mg/dL)    Comment 1 Notify RN     GLUCOSE, CAPILLARY     Status: Abnormal   Collection Time   01/31/11 11:25 AM      Component Value  Range Comment   Glucose-Capillary 160 (*) 70 - 99 (mg/dL)    Comment 1 Notify RN     GLUCOSE, CAPILLARY     Status: Abnormal   Collection Time   01/31/11  4:28 PM      Component Value Range Comment   Glucose-Capillary 158 (*) 70 - 99 (mg/dL)    Comment 1 Notify RN     GLUCOSE, CAPILLARY     Status: Abnormal   Collection Time   01/31/11  8:24 PM      Component Value Range Comment   Glucose-Capillary 202 (*) 70 - 99 (mg/dL)    Comment 1 Notify RN     GLUCOSE, CAPILLARY     Status: Abnormal   Collection Time   01/31/11 10:02 PM      Component Value Range Comment   Glucose-Capillary 178 (*) 70 - 99 (mg/dL)    Comment 1 Notify RN     GLUCOSE, CAPILLARY     Status: Abnormal   Collection Time   02/01/11  7:14 AM      Component Value Range Comment   Glucose-Capillary 166 (*) 70 - 99 (mg/dL)    Comment 1 Notify RN      Physical Exam  Constitutional: He is oriented to person, place, and time. Appears fatigued HENT:  Head: Normocephalic.  Neck:  Collar in place  Cardiovascular: Normal rate.  Pulmonary/Chest: Breath sounds normal. He has no wheezes.  Abdominal: He exhibits no  distension. There is no tenderness.  Musculoskeletal: He exhibits no edema.  Neurological: He is alert and oriented to person, place, and time. A sensory deficit is present. Coordination reduced in BUEs Patient with 4/5 strength in both upper extremities grossly except 2 to 3-/5 grip. Uses tenodesis grip. He had some diminishment of his fine touch in the hands right more than left. In the lower extremities strength was tr/5 proximally at the right hip flexors 1-tr at the right knee extensors and flexors and to tr-1/5 right at the ankle dorsiflexors and plantar flexors. Left leg 1-1+/5 generally (left is a little stronger than right leg). He had some diminishment of his leg sensation at 1 plus out of 2. Fine motor coordination of the upper extremities was fair. Cognitively he was alert and oriented x 3 with extra time,  but was slow to process . CN exam unremarkable 2-12. DTR's 2+ in lower ext's and resting tone at knees and ankles of 1+ /4.  Skin: Surgical site clean. C-collar fitting appropriately. Erythema and mild breakdown at sacrum with EPBC in place Psychiatric: Affect was flat but he was generally pleasant.    Assessment/Plan: 1. Functional deficits secondary to Cervical myelopathy causing tetraplegia C7 ASIA B which require 3+ hours per day of interdisciplinary therapy in a comprehensive inpatient rehab setting. Physiatrist is providing close team supervision and 24 hour management of active medical problems listed below. Physiatrist and rehab team continue to assess barriers to discharge/monitor patient progress toward functional and medical goals.  Spoke to daughter at length about rehab progress and expectations going forward.  Mobility: Bed Mobility Bed Mobility: Yes Rolling Right: 2: Max assist Rolling Right Details (indicate cue type and reason): Cues for UE use, A for bil LEs Rolling Left: 2: Max assist Rolling Left Details (indicate cue type and reason): Cues for UE use, A for bil LEs Left Sidelying to Sit: 1: +1 Total assist;HOB flat Left Sidelying to Sit Details (indicate cue type and reason): Pt able to A with bil UEs, but A needed for trunk and LEs Sitting - Scoot to Edge of Bed: 2: Max assist Sitting - Scoot to [place] of Bed Details (indicate cue type and reason): Cues for hand placement and timing, A for anterior translation at hips Sit to Supine - Right: 1: +2 Total assist;HOB flat Scooting to HOB: 1: +2 Total assist Scooting to [facility] Details (indicate cue type and reason): A to position LEs in hooklying and cues to lift hips. A for scooting Transfers Transfers: Yes Sit to Stand: 1: +2 Total assist;With upper extremity assist;From bed Sit to Stand Details (indicate cue type and reason): A needed for lifting and lowering. Pt unable to clear hps from bed without +2 A. Tried Sara Plus,  but increased thoracic pain, so stopped.  Stand to Sit: 1: +2 Total assist;With upper extremity assist;To bed Ambulation/Gait Ambulation/Gait Assistance: Not tested (comment) Stairs: No Wheelchair Mobility Wheelchair Mobility: No Distance: 40  ADL:    Cognition: Cognition Overall Cognitive Status: Appears within functional limits for tasks assessed Arousal/Alertness: Awake/alert Orientation Level: Oriented to person;Oriented to place;Oriented to time;Other (Comment) (can be a little disoriented at night/re-orients easily) Attention: Sustained Sustained Attention: Appears intact Memory: Appears intact Awareness: Appears intact Problem Solving: Appears intact Safety/Judgment: Appears intact Cognition Arousal/Alertness: Awake/alert Orientation Level: Oriented to person;Oriented to place;Oriented to time;Other (Comment) (can be a little disoriented at night/re-orients easily)      Medical problem list  1.Cervical Myelopathy-S/Pcervical diskectomy with decompression 12/3.Cervical collar at all  times.Decadron taper.  2. DVT Prophylaxis/Anticoagulation: SCD/support hose.No current signs of DVT. Dopplers are negative.   3. Pain Management: Oxycodone/robaxin.Monitor with increased activity.    4. Neurogenic bladder and hematuria/BPH-.Foley tube in place with hematuria resolving.Continue flomax for now.Empiric keflex 12/7 and plan follow up urine study.Will discuss voiding trial with urology services. Needs to keep foley in regardless to protect skin also  5. Neurogenic bowel: -am bowel  Program  -check KUB.  -add simethicone. Sx better after he has bm.   5.Steriod induced hyperglycemia-check blood sugars ac/hs and monitor as taper steroids. Cover with SSI  6.Depression-celexa daily.Provide emotional support.   7.GERD-protonix   8.Hyperlipidemia-zocor  5  SWARTZ,ZACHARY T 02/01/2011, 8:04 AM

## 2011-02-01 NOTE — Progress Notes (Signed)
Physical Therapy Note  Patient Details  Name: Ricky Bradley MRN: [id number] Date of Birth: 05-28-1933 Today's Date: 02/01/2011  13:00- 14:00 time spent changing back rest on tilt in space wc to increase comfort with out success. Plan is to swith to dumped breezy with j3 back and J3 cushion. Tech is [employer]. Rolling mod assist both ways with pt directing the need for knees to be bent. Left side to sit total assist with vc for weight bearing on hands to keep balance.beasy board transfer total assist with vc  For pt to count increased time needed for pt to initiate lift. Pt able to stay forward for transfer.  Pt assisted on side with out brief to allow skin to air out and to perform pressure relief. Pt demonstrated initiation of taking charge of care by asking when he should ask to be turned onto his other side.   [name] 02/01/2011, 4:45 PM

## 2011-02-01 NOTE — Progress Notes (Signed)
Occupational Therapy Session Note  Patient Details  Name: Ricky Bradley MRN: [id number] Date of Birth: [birth year]  Today's Date: 02/01/2011 Time: 1000-1100 Time Calculation (min): 60 min  Precautions: Precautions Precautions: Fall Precaution Comments: cervical precautions Required Braces or Orthoses: Yes Cervical Brace: Hard collar;Applied in supine position Restrictions Weight Bearing Restrictions: No  Short Term Goals: OT Short Term Goal 1: Pt. will be minimal assist with feeding self OT Short Term Goal 2: Pt. will be be set up with UB bathing OT Short Term Goal 3: Pt.  will verbalize strategies for pressur relief with minimal cues OT Short Term Goal 4: Pt.  will sit up for 2 hours daily in chair  Skilled Therapeutic Interventions/Progress Updates:    ADL retraining - LB bathing and dressing supine with HOB elevated.  Pt total A for LB tasks but assisted with rolling side to side.  Pt incontinent of bowel and was total A for hygiene.  Total assist for supine to sit.  Pt maintained static sitting EOB with UE support at supervision.  Pt transferred to w/c with sliding board at total assist + 2.  Pt completed UB bathing and dressing seated in w/c at sink.  Pt completed all UB bathing tasks with setup.  Pt required assist to pull shirt around back and button shirt.  Pt exhibits decreased fine motor control.  Focus on bed mobility, sitting balance, transfers, and activity tolerance.   Pain Pain Assessment Pain Assessment: 0-10 (discomfort in abdomen area) Pain Intervention(s): RN made aware  Therapy/Group: Individual Therapy  [name] 02/01/2011, 12:08 PM

## 2011-02-02 LAB — GLUCOSE, CAPILLARY
Glucose-Capillary: 252 mg/dL — ABNORMAL HIGH (ref 70–99)
Glucose-Capillary: 169 mg/dL — ABNORMAL HIGH (ref 70–99)
Glucose-Capillary: 171 mg/dL — ABNORMAL HIGH (ref 70–99)

## 2011-02-02 MED ORDER — FLUCONAZOLE 100 MG PO TABS
100.0000 mg | ORAL_TABLET | Freq: Every day | ORAL | Status: AC
  Administered 2011-02-02 – 2011-02-04 (×3): 100 mg via ORAL
  Filled 2011-02-02 (×5): qty 1

## 2011-02-02 MED ORDER — CALCIUM POLYCARBOPHIL 625 MG PO TABS
625.0000 mg | ORAL_TABLET | Freq: Two times a day (BID) | ORAL | Status: DC
  Administered 2011-02-02 – 2011-02-03 (×3): 625 mg via ORAL
  Administered 2011-02-04: 12:00:00 via ORAL
  Administered 2011-02-04: 625 mg via ORAL
  Administered 2011-02-05: 13:00:00 via ORAL
  Administered 2011-02-05 – 2011-02-07 (×5): 625 mg via ORAL
  Administered 2011-02-07 – 2011-02-08 (×3): via ORAL
  Administered 2011-02-08: 625 mg via ORAL
  Administered 2011-02-09: 08:00:00 via ORAL
  Filled 2011-02-02 (×17): qty 1

## 2011-02-02 MED ORDER — SACCHAROMYCES BOULARDII 250 MG PO CAPS
250.0000 mg | ORAL_CAPSULE | Freq: Two times a day (BID) | ORAL | Status: DC
  Administered 2011-02-02 – 2011-02-06 (×9): 250 mg via ORAL
  Filled 2011-02-02 (×13): qty 1

## 2011-02-02 MED ORDER — INSULIN GLARGINE 100 UNIT/ML ~~LOC~~ SOLN
20.0000 [IU] | SUBCUTANEOUS | Status: DC
  Administered 2011-02-03 – 2011-02-22 (×20): 20 [IU] via SUBCUTANEOUS
  Filled 2011-02-02 (×2): qty 3

## 2011-02-02 NOTE — Progress Notes (Signed)
Occupational Therapy Session Note  Patient Details  Name: Ricky Bradley MRN: [id number] Date of Birth: October 25, 1933  Today's Date: 02/02/2011 Time: 1400-1430 Time Calculation (min): 30 min  Precautions: Precautions Precautions: Fall Precaution Comments: cervical precautions Required Braces or Orthoses: Yes Cervical Brace: Hard collar;Applied in supine position Restrictions Weight Bearing Restrictions: No   Skilled Therapeutic Interventions/Progress Updates:    Bed mobility activities to assist therapist with repositioning in bed.  Static sitting EOB to increase activity tolerance in preparation for ADLs sitting unsupported.  Pt sits EOB with BUE support with supervision.  Pt requires tot A for sitting unsupported without BUE support.  Pt assists with rolling side to side using bed rails.     Pain Pain Assessment Pain Assessment: 0-10 Pain Score:   6 Pain Type: Acute pain Pain Location: Abdomen Pain Orientation: Mid;Upper Pain Descriptors: Cramping;Discomfort Pain Onset: On-going Patients Stated Pain Goal: 0 Pain Intervention(s): RN made aware  Therapy/Group: Individual Therapy  [name] 02/02/2011, 2:46 PM

## 2011-02-02 NOTE — Progress Notes (Signed)
Patient ID: CAROL LOFTIN, male   DOB: [birth year], 77 y.o.   MRN: [id number] Patient ID: PRATT BRESS, male   DOB: [birth year], 77 y.o.   MRN: [id number] Patient ID: JODIE LEINER, male   DOB: 1933-07-09, 77 y.o.   MRN: [id number] Subjective/Complaints: C/o abdominal bloating. Affecting sleep.  Review of Systems  HENT: Positive for neck pain.   Gastrointestinal:       Still feels a little bloated.  Appetite reasonable though  Musculoskeletal: Positive for back pain.  All other systems reviewed and are negative.   Objective: Vital Signs: Blood pressure 130/80, pulse 93, temperature 97.8 F (36.6 C), temperature source Oral, resp. rate 18, weight 71.4 kg (157 lb 6.5 oz), SpO2 98.00%. Dg Abd 1 View  02/01/2011  *RADIOLOGY REPORT*  Clinical Data: Abdominal distention.  ABDOMEN - 1 VIEW  Comparison: Radiographs dated 12/10/2010  Findings: There are no dilated loops of large or small bowel. There is moderate stool in the right side of the colon.  No fecal impaction.  No acute osseous abnormality.  Evidence of previous laminectomies at L4.  Evidence of previous gallbladder surgery.  IMPRESSION: Benign-appearing abdomen.  No dilated bowel.  Original Report Authenticated By: Gwynn Burly, M.D.   Results for orders placed during the hospital encounter of 01/27/11 (from the past 72 hour(s))  GLUCOSE, CAPILLARY     Status: Abnormal   Collection Time   01/30/11 11:24 AM      Component Value Range Comment   Glucose-Capillary 171 (*) 70 - 99 (mg/dL)    Comment 1 Notify RN     GLUCOSE, CAPILLARY     Status: Abnormal   Collection Time   01/30/11  5:11 PM      Component Value Range Comment   Glucose-Capillary 147 (*) 70 - 99 (mg/dL)   GLUCOSE, CAPILLARY     Status: Abnormal   Collection Time   01/30/11  8:36 PM      Component Value Range Comment   Glucose-Capillary 181 (*) 70 - 99 (mg/dL)    Comment 1 Notify RN     GLUCOSE, CAPILLARY     Status: Abnormal   Collection Time   01/31/11  7:56 AM   Component Value Range Comment   Glucose-Capillary 140 (*) 70 - 99 (mg/dL)    Comment 1 Notify RN     GLUCOSE, CAPILLARY     Status: Abnormal   Collection Time   01/31/11 11:25 AM      Component Value Range Comment   Glucose-Capillary 160 (*) 70 - 99 (mg/dL)    Comment 1 Notify RN     GLUCOSE, CAPILLARY     Status: Abnormal   Collection Time   01/31/11  4:28 PM      Component Value Range Comment   Glucose-Capillary 158 (*) 70 - 99 (mg/dL)    Comment 1 Notify RN     GLUCOSE, CAPILLARY     Status: Abnormal   Collection Time   01/31/11  8:24 PM      Component Value Range Comment   Glucose-Capillary 202 (*) 70 - 99 (mg/dL)    Comment 1 Notify RN     GLUCOSE, CAPILLARY     Status: Abnormal   Collection Time   01/31/11 10:02 PM      Component Value Range Comment   Glucose-Capillary 178 (*) 70 - 99 (mg/dL)    Comment 1 Notify RN     GLUCOSE, CAPILLARY     Status: Abnormal  Collection Time   02/01/11  7:14 AM      Component Value Range Comment   Glucose-Capillary 166 (*) 70 - 99 (mg/dL)    Comment 1 Notify RN     GLUCOSE, CAPILLARY     Status: Abnormal   Collection Time   02/01/11 11:18 AM      Component Value Range Comment   Glucose-Capillary 156 (*) 70 - 99 (mg/dL)    Comment 1 Notify RN     GLUCOSE, CAPILLARY     Status: Abnormal   Collection Time   02/01/11  4:47 PM      Component Value Range Comment   Glucose-Capillary 201 (*) 70 - 99 (mg/dL)    Comment 1 Notify RN     GLUCOSE, CAPILLARY     Status: Abnormal   Collection Time   02/01/11 10:05 PM      Component Value Range Comment   Glucose-Capillary 252 (*) 70 - 99 (mg/dL)   GLUCOSE, CAPILLARY     Status: Abnormal   Collection Time   02/02/11  7:31 AM      Component Value Range Comment   Glucose-Capillary 169 (*) 70 - 99 (mg/dL)    Comment 1 Notify RN      Physical Exam  Constitutional: He is oriented to person, place, and time. Appears fatigued HENT:  Head: Normocephalic.  Neck:  Collar in place    Cardiovascular: Normal rate.  Pulmonary/Chest: Breath sounds normal. He has no wheezes.  Abdominal: He exhibits no distension. There is no tenderness. Bowel sounds are active. Musculoskeletal: He exhibits no edema.  Neurological: He is alert and oriented to person, place, and time. A sensory deficit is present. Coordination reduced in BUEs Patient with 4/5 strength in both upper extremities. HI are improving and now 3-3+/5. Marland Kitchen Uses tenodesis grip to improve functional grasp. He had some diminishment of his fine touch in the hands right more than left. In the lower extremities strength was tr/5 proximally at the right hip flexors 1-tr at the right knee extensors and flexors and to tr-1/5 right at the ankle dorsiflexors and plantar flexors. Left leg 1-1+/5 generally (left is a little stronger than right leg). He had some diminishment of his leg sensation at 1 plus out of 2. Fine motor coordination of the upper extremities was fair. Cognitively he was alert and oriented x 3 with extra time, but was slow to process . CN exam unremarkable 2-12. DTR's 2+ in lower ext's and resting tone at knees and ankles of 1+ /4.  Skin: Surgical site clean. C-collar fitting appropriately. Erythema and mild breakdown at sacrum with EPBC in place Psychiatric: pleasant, but flat    Assessment/Plan: 1. Functional deficits secondary to Cervical myelopathy causing tetraplegia C7 ASIA B which require 3+ hours per day of interdisciplinary therapy in a comprehensive inpatient rehab setting. Physiatrist is providing close team supervision and 24 hour management of active medical problems listed below. Physiatrist and rehab team continue to assess barriers to discharge/monitor patient progress toward functional and medical goals.  Spoke to daughter at length about rehab progress and expectations going forward.  Mobility: Bed Mobility Bed Mobility: Yes Rolling Right: 2: Max assist Rolling Right Details (indicate cue type and  reason): Cues for UE use, A for bil LEs Rolling Left: 2: Max assist Rolling Left Details (indicate cue type and reason): Cues for UE use, A for bil LEs Left Sidelying to Sit: 1: +1 Total assist;HOB flat Left Sidelying to Sit Details (indicate cue type  and reason): Pt able to A with bil UEs, but A needed for trunk and LEs Sitting - Scoot to Edge of Bed: 2: Max assist Sitting - Scoot to [place] of Bed Details (indicate cue type and reason): Cues for hand placement and timing, A for anterior translation at hips Sit to Supine - Right: 1: +2 Total assist;HOB flat Scooting to HOB: 1: +2 Total assist Scooting to [facility] Details (indicate cue type and reason): A to position LEs in hooklying and cues to lift hips. A for scooting Transfers Transfers: Yes Sit to Stand: 1: +2 Total assist;With upper extremity assist;From bed Sit to Stand Details (indicate cue type and reason): A needed for lifting and lowering. Pt unable to clear hps from bed without +2 A. Tried Sara Plus, but increased thoracic pain, so stopped.  Stand to Sit: 1: +2 Total assist;With upper extremity assist;To bed Ambulation/Gait Ambulation/Gait Assistance: Not tested (comment) Stairs: No Wheelchair Mobility Wheelchair Mobility: No Distance: 40  ADL:    Cognition: Cognition Overall Cognitive Status: Appears within functional limits for tasks assessed Arousal/Alertness: Awake/alert Orientation Level: Oriented to person;Oriented to place;Oriented to situation (can be disoriented at HS ) Attention: Sustained Sustained Attention: Appears intact Memory: Appears intact Awareness: Appears intact Problem Solving: Appears intact Safety/Judgment: Appears intact Cognition Arousal/Alertness: Awake/alert Orientation Level: Oriented to person;Oriented to place;Oriented to situation (can be disoriented at HS )      Medical problem list  1.Cervical Myelopathy-S/Pcervical diskectomy with decompression 12/3.Cervical collar at all  times.Decadron taper--need to decide on schedule.  2. DVT Prophylaxis/Anticoagulation: SCD/support hose.No current signs of DVT. Dopplers are negative.   3. Pain Management: Oxycodone/robaxin.Monitor with increased activity.    4. Neurogenic bladder and hematuria/BPH-.Foley tube in place with hematuria resolving.Continue flomax for now.Empiric keflex 12/7 and plan follow up urine study.Will discuss voiding trial with urology services. Needs to keep foley in regardless to protect skin also  5. Neurogenic bowel: -am bowel  Program  -KUB really unremarkable.   -add simethicone. Sx better after he has bm.  -discussed the importance of regular po intake and bowel habits.  -probiotic for hx ibs  -some of bloating sensation is also from sensory changes below level of injury   5.Steriod induced hyperglycemia-check blood sugars ac/hs and monitor as taper steroids. Cover with SSI.  Increase lantus today until we decide on taper and because PO intake appears to be picking up.  6.Depression-celexa daily.Provide emotional support.   7.GERD-protonix   8.Hyperlipidemia-zocor  6  Latesha Chesney T 02/02/2011, 8:28 AM

## 2011-02-02 NOTE — Progress Notes (Signed)
Occupational Therapy Session Note  Patient Details  Name: TRAVEON LOURO MRN: [id number] Date of Birth: 1933-03-22  Today's Date: 02/02/2011 Time: 1000-1100 Time Calculation (min): 60 min  Precautions: Precautions Precautions: Fall Precaution Comments: cervical precautions Required Braces or Orthoses: Yes Cervical Brace: Hard collar;Applied in supine position Restrictions Weight Bearing Restrictions: No   Skilled Therapeutic Interventions/Progress Updates:    ADL retraining including LB bathing and dressing supine with HOB elevated; pt able to bathe front perineal area and bilateral upper legs but unable to assist with pulling up pants secondary to decreased grasp.  Pt max assist for supine to sit with max verbal cues for hand placement.  Pt completed Beasy Board transfer with total A + 2 (pt=25%) with max verbal cues for hand placement and to lean forward during transfer.  Pt completed UB bathing and dressing seated in w/c at sink.  Pt able to lean forward onto sink to allow therapist to bathe pt's back and pull shirt around back.  Pt required assistance buttoning shirt secondary to decreased fine motor/manipulation skills.  Focus on activity tolerance, static sitting balance, dynamic sitting balance, transfers, and increased UE/hand use for activities.    Pain Pain Assessment Pain Assessment: No/denies pain  Therapy/Group: Individual Therapy  [name] 02/02/2011, 12:01 PM

## 2011-02-02 NOTE — Progress Notes (Signed)
Physical Therapy Note  Patient Details  Name: GREYDEN BESECKER MRN: [id number] Date of Birth: 12-Sep-1933 Today's Date: 02/02/2011  Individual therapy 1300-1340 (40 minutes). Pt complaint of "burning" in his bottom and being uncomfortable up in chair. Pt states he has been sitting up since 10 am. Pt thought he may have had a bowel movement. Transfer training with use of Beazy board back to bed from w/c with total A +2. Worked on rolling side to side with use of rails minA. Trace amount of stool in brief, pt requested to lay on side without brief on to "get some air back there" after being cleaned up. Positioned in left sidelying with pillows for comfort and pressure relief.  Karolee Stamps [facility] 02/02/2011, 1:46 PM

## 2011-02-02 NOTE — Progress Notes (Signed)
Physical Therapy Note  Patient Details  Name: TAKARI LUNDAHL MRN: [id number] Date of Birth: 1933-09-07 Today's Date: 02/02/2011  1100-1155 (55 minutes) indivlidual treatment Pain: 5/10 low back, Rt ribs- premedicated Focus of treatment: Therapeutic activities to facilitate static sitting balance Pt. Unable to maintain static sitting balance on firm surface using UE assist secondary to decreased strength/control bilateral UES Treatment: Transfers total assist (+2) Beazy board or sliding board; Pt. Needs assist to attain midline sitting posture; sitting with bedside table in front using unilateral UE support while performing activity to facilitate pinch grip (large pegs); minimal forward/backward/sideways motion of trunk with hands on knees; anterior/posterior pelvic tilts in sitting. Pt fatigues easily.  Hagen Tidd,JIM 02/02/2011, 11:54 AM

## 2011-02-02 NOTE — Progress Notes (Signed)
Patient ID: Ricky Bradley, male   DOB: 07-08-1933, 78 y.o.   MRN: [id number] Blanchable redness noted to bilateral buttocks and scrotum, also in bilateral scapulas and bilateral heels, all areas cleansed and protective ointment and creams applied. Pt provided with education regarding skin care and turning and positioning. Pt. Did c/o pain and muscle tension in back, medicated see MAR

## 2011-02-03 DIAGNOSIS — M4712 Other spondylosis with myelopathy, cervical region: Secondary | ICD-10-CM

## 2011-02-03 DIAGNOSIS — K592 Neurogenic bowel, not elsewhere classified: Secondary | ICD-10-CM

## 2011-02-03 DIAGNOSIS — N319 Neuromuscular dysfunction of bladder, unspecified: Secondary | ICD-10-CM

## 2011-02-03 DIAGNOSIS — Z5189 Encounter for other specified aftercare: Secondary | ICD-10-CM

## 2011-02-03 DIAGNOSIS — G8250 Quadriplegia, unspecified: Secondary | ICD-10-CM

## 2011-02-03 LAB — GLUCOSE, CAPILLARY

## 2011-02-03 MED ORDER — HYDROCORTISONE 2.5 % RE CREA
TOPICAL_CREAM | Freq: Three times a day (TID) | RECTAL | Status: DC
  Administered 2011-02-03 – 2011-02-17 (×43): via RECTAL
  Administered 2011-02-18: 1 via RECTAL
  Administered 2011-02-18 – 2011-02-24 (×18): via RECTAL
  Filled 2011-02-03 (×3): qty 28.35

## 2011-02-03 MED ORDER — WHITE PETROLATUM GEL
Status: AC
  Filled 2011-02-03: qty 5

## 2011-02-03 NOTE — Progress Notes (Signed)
Physical Therapy Note  Patient Details  Name: KALUM MINNER MRN: [id number] Date of Birth: 1933/12/11 Today's Date: 02/03/2011  14:00- 14:45:  Individual therapy, 6/10 pain all over. Pt comfortable in new wc. wc mobility 2 x 120' with initial mod assist progressing to min assist with theraband placed on wheels for increased grip. Practiced turning with supervision and vc for technique.  Sliding board transfers x 2 with 2 person assist with vc for pt counting, hand position and hand placement for balance. Sit to supine max assist.   [name] 02/03/2011, 4:10 PM

## 2011-02-03 NOTE — Progress Notes (Signed)
Occupational Therapy Note  Patient Details  Name: Ricky Bradley MRN: [id number] Date of Birth: 10-24-1933 Today's Date: 02/03/2011 Pain:  6/10 numbness all over.   Time:  1445-1530  (45 mins) Individual Therapy  Engaged in transfer from wc to bed, sit to sidelying, bed mobility, education on pressure relief, using hand in manipulative activties (undressing).    Asked pt 20 minutes at what time he was to readjust and he recalled incorrectly.  Pt. Unable to pull self up to sitting with rails even with max assist.  Provided manual cues for positioning and movement.     Humberto Seals 02/03/2011, 3:32 PM

## 2011-02-03 NOTE — Progress Notes (Signed)
Per State Regulation 482.30 This chart was reviewed for medical necessity with respect to the patient's Admission/Duration of stay. Pt participating therapies.  Nursing working on bowel program.  Family reports pt eating better here in hospital than he did at home.  Family very supportive and working hard to get information about d/c options to help them w/ d/c planning.   Brock Ra                 Nurse Care Manager              Next Review Date: 02/09/11

## 2011-02-03 NOTE — Progress Notes (Signed)
Occupational Therapy Note  Patient Details  Name: Ricky Bradley MRN: [id number] Date of Birth: [birth year] Today's Date: 02/03/2011 Pain:  7/10.  RN notified Time;  1300-1400  (60 mins. ) Individual Therapy  Pt. Sitting in wc upon OT arrival.  Pt. Stated he feels that he has had a bowel movement.  Educated pt on pressure relief and assisted pt's wc in upright position.  Had pt lean towards OT.  Another staff checked pants for soiling.  No soiling noted.   Assisted pt from wc to bed with sliding board with total assist +2 (pt=5 %).  Addressed rolling to right and left with mod assist with facilitation at hips.  Transferred pt to different wc.  Addressed wc mobility with mod assist.  Pt stated with the different positioning his pain had gone down to 5/10.  Pt. Left in wc with next therapy.     Humberto Seals 02/03/2011, 2:20 PM

## 2011-02-03 NOTE — Progress Notes (Signed)
Social Work   RN [employer], Melanee Spry, and myself met with patient's daughter and son yesterday afternoon to discuss pt's functional status, goals and discharge planning.  Family with many questions regarding medical issues (directed to speak with Neurosurgeon), care on unit and d/c options.    Reviewed  options for pt's care at d/c including SNF vs private duty care at home.  Both children work full-time and daughter lives in [place], [place].  The patient does have a long term care policy that they can utilize for his care with either option.    At this point, the children and wife are "overwhelmed" with just dealing with and accepting pt's current medical issues as they "never expected this".  We provided emotional support to the family while encouraging them to continue to consider their d/c plans and options.    Will continue to follow to assist in their decision making and to offer support with this difficult situation.  Adora Yeh

## 2011-02-03 NOTE — Progress Notes (Signed)
Patient ID: Ricky Bradley, male   DOB: 07-14-1933, 77 y.o.   MRN: [id number] Subjective/Complaints: Incont of stool with reduced sensation per nsg, establishing bowel program. Review of Systems  HENT: Positive for neck pain.   Gastrointestinal:       Still feels a little bloated.  Appetite reasonable though  Musculoskeletal: Positive for back pain.  All other systems reviewed and are negative.   Objective: Vital Signs: Blood pressure 116/76, pulse 81, temperature 97.4 F (36.3 C), temperature source Oral, resp. rate 18, weight 71.4 kg (157 lb 6.5 oz), SpO2 98.00%. Dg Abd 1 View  02/01/2011  *RADIOLOGY REPORT*  Clinical Data: Abdominal distention.  ABDOMEN - 1 VIEW  Comparison: Radiographs dated 12/10/2010  Findings: There are no dilated loops of large or small bowel. There is moderate stool in the right side of the colon.  No fecal impaction.  No acute osseous abnormality.  Evidence of previous laminectomies at L4.  Evidence of previous gallbladder surgery.  IMPRESSION: Benign-appearing abdomen.  No dilated bowel.  Original Report Authenticated By: Gwynn Burly, M.D.   Results for orders placed during the hospital encounter of 01/27/11 (from the past 72 hour(s))  GLUCOSE, CAPILLARY     Status: Abnormal   Collection Time   01/31/11 11:25 AM      Component Value Range Comment   Glucose-Capillary 160 (*) 70 - 99 (mg/dL)    Comment 1 Notify RN     GLUCOSE, CAPILLARY     Status: Abnormal   Collection Time   01/31/11  4:28 PM      Component Value Range Comment   Glucose-Capillary 158 (*) 70 - 99 (mg/dL)    Comment 1 Notify RN     GLUCOSE, CAPILLARY     Status: Abnormal   Collection Time   01/31/11  8:24 PM      Component Value Range Comment   Glucose-Capillary 202 (*) 70 - 99 (mg/dL)    Comment 1 Notify RN     GLUCOSE, CAPILLARY     Status: Abnormal   Collection Time   01/31/11 10:02 PM      Component Value Range Comment   Glucose-Capillary 178 (*) 70 - 99 (mg/dL)    Comment 1  Notify RN     GLUCOSE, CAPILLARY     Status: Abnormal   Collection Time   02/01/11  7:14 AM      Component Value Range Comment   Glucose-Capillary 166 (*) 70 - 99 (mg/dL)    Comment 1 Notify RN     GLUCOSE, CAPILLARY     Status: Abnormal   Collection Time   02/01/11 11:18 AM      Component Value Range Comment   Glucose-Capillary 156 (*) 70 - 99 (mg/dL)    Comment 1 Notify RN     GLUCOSE, CAPILLARY     Status: Abnormal   Collection Time   02/01/11  4:47 PM      Component Value Range Comment   Glucose-Capillary 201 (*) 70 - 99 (mg/dL)    Comment 1 Notify RN     GLUCOSE, CAPILLARY     Status: Abnormal   Collection Time   02/01/11 10:05 PM      Component Value Range Comment   Glucose-Capillary 252 (*) 70 - 99 (mg/dL)   GLUCOSE, CAPILLARY     Status: Abnormal   Collection Time   02/02/11  7:31 AM      Component Value Range Comment   Glucose-Capillary 169 (*) 70 -  99 (mg/dL)    Comment 1 Notify RN     GLUCOSE, CAPILLARY     Status: Abnormal   Collection Time   02/02/11 11:59 AM      Component Value Range Comment   Glucose-Capillary 171 (*) 70 - 99 (mg/dL)    Comment 1 Notify RN     GLUCOSE, CAPILLARY     Status: Abnormal   Collection Time   02/02/11  5:06 PM      Component Value Range Comment   Glucose-Capillary 167 (*) 70 - 99 (mg/dL)   GLUCOSE, CAPILLARY     Status: Abnormal   Collection Time   02/02/11  9:56 PM      Component Value Range Comment   Glucose-Capillary 164 (*) 70 - 99 (mg/dL)    Physical Exam  Constitutional: He is oriented to person, place, and time. Appears fatigued HENT:  Head: Normocephalic.  Neck:  Collar in place  Cardiovascular: Normal rate.  Pulmonary/Chest: Breath sounds normal. He has no wheezes.  Abdominal: He exhibits no distension. There is no tenderness. Bowel sounds are active. Musculoskeletal: He exhibits no edema.  Neurological: He is alert and oriented to person, place, and time. A sensory deficit is present. Coordination reduced in  BUEs Patient with 4/5 strength in R upper extremity, 3/5 on Left. HI are improving and now 3-3+/5. Marland Kitchen Uses tenodesis grip to improve functional grasp. He had some diminishment of his fine touch in the hands right more than left. In the lower extremities strength was tr/5 proximally at the right hip flexors 1-tr at the right knee extensors and flexors and to tr-1/5 right at the ankle dorsiflexors and plantar flexors. Left leg 1-1+/5 generally (left is a little stronger than right leg). He had some diminishment of his leg sensation at 1 plus out of 2. Fine motor coordination of the upper extremities was fair. Cognitively he was alert and oriented x 3 with extra time, but was slow to process . CN exam unremarkable 2-12. DTR's 2+ in lower ext's and resting tone at knees and ankles of 1+ /4.  Skin: Surgical site clean. C-collar fitting appropriately. Erythema and mild breakdown at sacrum with EPBC in place Psychiatric: pleasant, but flat    Assessment/Plan: 1. Functional deficits secondary to Cervical myelopathy causing tetraplegia C7 ASIA C which require 3+ hours per day of interdisciplinary therapy in a comprehensive inpatient rehab setting. Physiatrist is providing close team supervision and 24 hour management of active medical problems listed below. Physiatrist and rehab team continue to assess barriers to discharge/monitor patient progress toward functional and medical goals.  Spoke to daughter at length about rehab progress and expectations going forward.  Mobility: Bed Mobility Bed Mobility: Yes Rolling Right: 2: Max assist Rolling Right Details (indicate cue type and reason): Cues for UE use, A for bil LEs Rolling Left: 2: Max assist Rolling Left Details (indicate cue type and reason): Cues for UE use, A for bil LEs Left Sidelying to Sit: 1: +1 Total assist;HOB flat Left Sidelying to Sit Details (indicate cue type and reason): Pt able to A with bil UEs, but A needed for trunk and LEs Sitting  - Scoot to Edge of Bed: 2: Max assist Sitting - Scoot to [place] of Bed Details (indicate cue type and reason): Cues for hand placement and timing, A for anterior translation at hips Sit to Supine - Right: 1: +2 Total assist;HOB flat Scooting to HOB: 1: +2 Total assist Scooting to [facility] Details (indicate cue type and reason):  A to position LEs in hooklying and cues to lift hips. A for scooting Transfers Transfers: Yes Sit to Stand: 1: +2 Total assist;With upper extremity assist;From bed Sit to Stand Details (indicate cue type and reason): A needed for lifting and lowering. Pt unable to clear hps from bed without +2 A. Tried Sara Plus, but increased thoracic pain, so stopped.  Stand to Sit: 1: +2 Total assist;With upper extremity assist;To bed Ambulation/Gait Ambulation/Gait Assistance: Not tested (comment) Stairs: No Wheelchair Mobility Wheelchair Mobility: No Distance: 40  ADL:    Cognition: Cognition Overall Cognitive Status: Appears within functional limits for tasks assessed Arousal/Alertness: Awake/alert Orientation Level: Oriented to person;Oriented to place Attention: Sustained Sustained Attention: Appears intact Memory: Appears intact Awareness: Appears intact Problem Solving: Appears intact Safety/Judgment: Appears intact Cognition Arousal/Alertness: Awake/alert Orientation Level: Oriented to person;Oriented to place      Medical problem list  1.Cervical Myelopathy-S/Pcervical diskectomy with decompression 12/3.Cervical collar at all times.Decadron taper--need to decide on schedule.  2. DVT Prophylaxis/Anticoagulation: SCD/support hose.No current signs of DVT. Dopplers are negative.   3. Pain Management: Oxycodone/robaxin.Monitor with increased activity.    4. Neurogenic bladder and hematuria/BPH-.Foley tube in place with hematuria resolving.Continue flomax for now.Empiric keflex 12/7 and plan follow up urine study.Will discuss voiding trial with urology services.  Needs to keep foley in regardless to protect skin also  5. Neurogenic bowel: -am bowel  Program  -fibercon and dulcolax   5.Steriod induced hyperglycemia-check blood sugars ac/hs and monitor as taper steroids. Cover with SSI.  Increase lantus today until we decide on taper and because PO intake appears to be picking up.  6.Depression-celexa daily.Provide emotional support.   7.GERD-protonix   8.Hyperlipidemia-zocor  7  Ugo Thoma E 02/03/2011, 8:31 AM

## 2011-02-03 NOTE — Progress Notes (Signed)
Occupational Therapy Weekly Progress Note  Patient Details  Name: Ricky Bradley MRN: [id number] Date of Birth: 1933-09-08  Today's Date: 02/03/2011 Time: 1000-1100 Time Calculation (min): 60 min  Therapy Documentation Precautions: Precautions Precautions: Fall Precaution Comments: cervical precautions Required Braces or Orthoses: Yes Cervical Brace: Hard collar;Applied in supine position Restrictions Weight Bearing Restrictions: No  Pain Pain Assessment Pain Assessment: No/denies pain  Therapy/Group: Individual Therapy  Therapy Session Note ADL retraining with focus on bed mobility, sitting balance EOB, transfers, increased UE use for ADLs, and activity tolerance.  LB bathing and dressing completed supine with HOB elevated.  Pt beginning to use BUE to raise legs to be able to bathe lower legs and thread pants.  Pt continues to require total A for dressing.  Pt max A for supine to sit EOB using bed rails. Total A for Beasy Board transfer to w/c.  Pt completes UB bathing/dressing seated in w/c.  Pt able to brush teeth after toothpaste applied using foam adaptor for toothbrush.  Pt requires assistance to button shirt.  OT Short Term Goals OT Short Term Goal 1: Pt. will be minimal assist with feeding self OT Short Term Goal 1 - Progress: Met OT Short Term Goal 2: Pt. will be be set up with UB bathing OT Short Term Goal 2 - Progress: Met OT Short Term Goal 3: Pt.  will verbalize strategies for pressur relief with minimal cues OT Short Term Goal 3 - Progress: Met OT Short Term Goal 4: Pt.  will sit up for 2 hours daily in chair OT Short Term Goal 4 - Progress: Met  Weekly Summary Pt has demonstrated steady progress since admission.  Pt is Mod A for bathing (lower body supine with HOB raised and UB seated in w/c at sink), tot A for LB dressing supine with HOB raised, supervision for grooming seated in w/c at sink, mod A for UB dressing seated in w/c at sink.  Pt continues to required  BUE support when seated EOB and requires max A when sitting with no support.  Pt continues to demonstrate decreased manipulation/fine motor skills and requires assistance with buttoning shirt and fastening pants.  Patient has met 4 of 4 short term goals.  Patient continues to demonstrate the following deficits: decreased functional use of bilateral UEs & LEs, decreased static & dynamic sitting balance/tolerance/endurance, decreased activity tolerance/endurance, decreased overall independence with basic ADL tasks. Therefore, patient will continue to benefit from skilled OT intervention to enhance overall performance with ADL.  Patient progressing toward long term goals..  Continue plan of care.  [name] [facility] 02/03/2011, 12:18 PM

## 2011-02-04 LAB — GLUCOSE, CAPILLARY
Glucose-Capillary: 202 mg/dL — ABNORMAL HIGH (ref 70–99)
Glucose-Capillary: 226 mg/dL — ABNORMAL HIGH (ref 70–99)

## 2011-02-04 NOTE — Progress Notes (Signed)
Pt alert oriented x 3, generalized weakness noted, pt becomes irritated at times, wearing aspen collar at all times, neck with steri strips to anterior incision site with no red/swell or drainage noted, foley catheter intact, incontinent of bowel, bowel program with suppository this am, small results noted, incontinent of bowel x 3 during day , small output noted, perineal area/rectal area with redness/sore noted, up with 2 person assist with slide board, poor fine motor to bue and able to wiggle toes to left foot on command, reg diet with meds whole in yogurt, needs set up for meal trays, will continue with plan of care

## 2011-02-04 NOTE — Progress Notes (Signed)
Patient ID: Ricky Bradley, male   DOB: 03-01-1933, 77 y.o.   MRN: [id number] Patient ID: Ricky Bradley, male   DOB: Dec 20, 1933, 77 y.o.   MRN: [id number] Subjective/Complaints: Incont of stool with reduced sensation per nsg, establishing bowel program.  Minor irritation with C collar Review of Systems  HENT: Positive for neck pain.   Gastrointestinal:       Still feels a little bloated.  Appetite reasonable though  Musculoskeletal: Positive for back pain.  All other systems reviewed and are negative.   Objective: Vital Signs: Blood pressure 121/78, pulse 81, temperature 97.7 F (36.5 C), temperature source Oral, resp. rate 20, weight 71.4 kg (157 lb 6.5 oz), SpO2 99.00%. No results found. Results for orders placed during the hospital encounter of 01/27/11 (from the past 72 hour(s))  GLUCOSE, CAPILLARY     Status: Abnormal   Collection Time   02/01/11 11:18 AM      Component Value Range Comment   Glucose-Capillary 156 (*) 70 - 99 (mg/dL)    Comment 1 Notify RN     GLUCOSE, CAPILLARY     Status: Abnormal   Collection Time   02/01/11  4:47 PM      Component Value Range Comment   Glucose-Capillary 201 (*) 70 - 99 (mg/dL)    Comment 1 Notify RN     GLUCOSE, CAPILLARY     Status: Abnormal   Collection Time   02/01/11 10:05 PM      Component Value Range Comment   Glucose-Capillary 252 (*) 70 - 99 (mg/dL)   GLUCOSE, CAPILLARY     Status: Abnormal   Collection Time   02/02/11  7:31 AM      Component Value Range Comment   Glucose-Capillary 169 (*) 70 - 99 (mg/dL)    Comment 1 Notify RN     GLUCOSE, CAPILLARY     Status: Abnormal   Collection Time   02/02/11 11:59 AM      Component Value Range Comment   Glucose-Capillary 171 (*) 70 - 99 (mg/dL)    Comment 1 Notify RN     GLUCOSE, CAPILLARY     Status: Abnormal   Collection Time   02/02/11  5:06 PM      Component Value Range Comment   Glucose-Capillary 167 (*) 70 - 99 (mg/dL)   GLUCOSE, CAPILLARY     Status: Abnormal   Collection  Time   02/02/11  9:56 PM      Component Value Range Comment   Glucose-Capillary 164 (*) 70 - 99 (mg/dL)   GLUCOSE, CAPILLARY     Status: Abnormal   Collection Time   02/03/11  1:19 PM      Component Value Range Comment   Glucose-Capillary 212 (*) 70 - 99 (mg/dL)    Comment 1 Documented in Chart     GLUCOSE, CAPILLARY     Status: Abnormal   Collection Time   02/03/11  4:36 PM      Component Value Range Comment   Glucose-Capillary 143 (*) 70 - 99 (mg/dL)   GLUCOSE, CAPILLARY     Status: Abnormal   Collection Time   02/03/11  9:02 PM      Component Value Range Comment   Glucose-Capillary 269 (*) 70 - 99 (mg/dL)    Comment 1 Notify RN     GLUCOSE, CAPILLARY     Status: Abnormal   Collection Time   02/04/11  7:35 AM      Component Value Range Comment  Glucose-Capillary 166 (*) 70 - 99 (mg/dL)    Comment 1 Notify RN      Physical Exam  Constitutional: He is oriented to person, place, and time. Appears fatigued HENT:  Head: Normocephalic.  Neck:  Collar in place  Cardiovascular: Normal rate.  Pulmonary/Chest: Breath sounds normal. He has no wheezes.  Abdominal: He exhibits no distension. There is no tenderness. Bowel sounds are active. Musculoskeletal: He exhibits no edema.  Neurological: He is alert and oriented to person, place, and time. A sensory deficit is present. Coordination reduced in BUEs Patient with 4/5 strength in R upper extremity, 3/5 on Left. HI are improving and now 3-3+/5. Marland Kitchen Uses tenodesis grip to improve functional grasp. He had some diminishment of his fine touch in the hands right more than left. In the lower extremities strength was tr/5 proximally at the right hip flexors 1-tr at the right knee extensors and flexors and to tr-1/5 right at the ankle dorsiflexors and plantar flexors. Left leg 1-1+/5 generally (left is a little stronger than right leg). He had some diminishment of his leg sensation at 1 plus out of 2. Fine motor coordination of the upper  extremities was fair. Cognitively he was alert and oriented x 3 with extra time, but was slow to process . CN exam unremarkable 2-12. DTR's 2+ in lower ext's and resting tone at knees and ankles of 1+ /4.  Skin: Surgical site clean. C-collar fitting appropriately. Erythema and mild breakdown at sacrum with EPBC in place Psychiatric: pleasant, but flat    Assessment/Plan: 1. Functional deficits secondary to Cervical myelopathy causing tetraplegia C7 ASIA C which require 3+ hours per day of interdisciplinary therapy in a comprehensive inpatient rehab setting. Physiatrist is providing close team supervision and 24 hour management of active medical problems listed below. Physiatrist and rehab team continue to assess barriers to discharge/monitor patient progress toward functional and medical goals.  Spoke to daughter at length about rehab progress and expectations going forward.  Mobility: Bed Mobility Bed Mobility: Yes Rolling Right: 2: Max assist Rolling Right Details (indicate cue type and reason): Cues for UE use, A for bil LEs Rolling Left: 2: Max assist Rolling Left Details (indicate cue type and reason): Cues for UE use, A for bil LEs Left Sidelying to Sit: 1: +1 Total assist;HOB flat Left Sidelying to Sit Details (indicate cue type and reason): Pt able to A with bil UEs, but A needed for trunk and LEs Sitting - Scoot to Edge of Bed: 2: Max assist Sitting - Scoot to [place] of Bed Details (indicate cue type and reason): Cues for hand placement and timing, A for anterior translation at hips Sit to Supine - Right: 1: +2 Total assist;HOB flat Scooting to HOB: 1: +2 Total assist Scooting to [facility] Details (indicate cue type and reason): A to position LEs in hooklying and cues to lift hips. A for scooting Transfers Transfers: Yes Sit to Stand: 1: +2 Total assist;With upper extremity assist;From bed Sit to Stand Details (indicate cue type and reason): A needed for lifting and lowering. Pt unable  to clear hps from bed without +2 A. Tried Sara Plus, but increased thoracic pain, so stopped.  Stand to Sit: 1: +2 Total assist;With upper extremity assist;To bed Ambulation/Gait Ambulation/Gait Assistance: Not tested (comment) Stairs: No Wheelchair Mobility Wheelchair Mobility: No Distance: 40  ADL:    Cognition: Cognition Overall Cognitive Status: Appears within functional limits for tasks assessed Arousal/Alertness: Awake/alert Orientation Level: Oriented to person;Oriented to place;Oriented to situation  Attention: Sustained Sustained Attention: Appears intact Memory: Appears intact Awareness: Appears intact Problem Solving: Appears intact Safety/Judgment: Appears intact Cognition Arousal/Alertness: Awake/alert Orientation Level: Oriented to person;Oriented to place;Oriented to situation      Medical problem list  1.Cervical Myelopathy-S/Pcervical diskectomy with decompression 12/3.Cervical collar at all times.Decadron taper--need to decide on schedule.  2. DVT Prophylaxis/Anticoagulation: SCD/support hose.No current signs of DVT. Dopplers are negative.   3. Pain Management: Oxycodone/robaxin.Monitor with increased activity.    4. Neurogenic bladder and hematuria/BPH-.Foley tube in place with hematuria resolving.Continue flomax for now.Empiric keflex 12/7 and plan follow up urine study.Will discuss voiding trial with urology services. Needs to keep foley in regardless to protect skin also  5. Neurogenic bowel: -am bowel  Program  -fibercon and dulcolax   5.Steriod induced hyperglycemia-check blood sugars ac/hs and monitor as taper steroids. Cover with SSI.  Increase lantus today until we decide on taper and because PO intake appears to be picking up.  6.Depression-celexa daily.Provide emotional support.   7.GERD-protonix   8.Hyperlipidemia-zocor  8  Rogelia Boga 02/04/2011, 10:04 AM

## 2011-02-04 NOTE — Progress Notes (Signed)
Physical Therapy Note  Patient Details  Name: BRACK SHADDOCK MRN: [id number] Date of Birth: March 24, 1933 Today's Date: 02/04/2011  1300-1355 (55 minutes) individual treatment Pain - no complaint of pain Focus of treatment; bed mobility,transfers, sitting balance Bed mobility: rolling mod assist with rail and cues for technique; side to sit max assist +1; sitting edge of bed-max assist Transfer: total assist +2 using sliding board (pt 0%) W/c mobility : min assist 60 feet using UEs Sitting balance: rhythmic stabilization to facilitate trunk control; seated small trunk circles min assist clockwise and counterclockwise with bilateral UEs holding edge of mat: seated raising contralateral UE from mat Pt requires assist to attain static sitting position on mat but can maintain using bilateral UE assist gripping edge of mat within base of support.   Tazaria Dlugosz,JIM 02/04/2011, 3:02 PM

## 2011-02-05 LAB — GLUCOSE, CAPILLARY: Glucose-Capillary: 245 mg/dL — ABNORMAL HIGH (ref 70–99)

## 2011-02-05 NOTE — Progress Notes (Signed)
Physical Therapy Session Note  Patient Details  Name: Ricky Bradley MRN: [id number] Date of Birth: [birth year]  Today's Date: 02/05/2011 Time: 7829-5621 Time Calculation (min): 55 min  Precautions: Precautions Precautions: Fall Precaution Comments: cervical precautions Required Braces or Orthoses: Yes Cervical Brace: Hard collar;Applied in supine position Restrictions Weight Bearing Restrictions: No  Short Term Goals: PT Short Term Goal 1: Perform all bed mobility Max A  PT Short Term Goal 1 - Progress: Progressing toward goal PT Short Term Goal 2: Perform all functional transfers Max A  PT Short Term Goal 2 - Progress: Progressing toward goal PT Short Term Goal 3: Maintain static sitting balance with Min PT Short Term Goal 3 - Progress: Progressing toward goal PT Short Term Goal 4: Maintain static standing balance Mod A PT Short Term Goal 4 - Progress: Progressing toward goal PT Short Term Goal 5: WC mobility Mod A x 50' PT Short Term Goal 5 - Progress: Progressing toward goal  Skilled Therapeutic Interventions/Progress Updates: Pt was very uncomfortable due to bowel program and buttock pain; he was seen bedside for bed mobility and strengthening and stretching exercises to facilitate transfers and sitting balance.  Rolling from partial to full R sidelying, and partial to full L sidelying with min A after set-up of LEs, x 10 each, focusing on use of bil UEs for reaching/momentum. No active motors detected in abdominal muscles during rolling.  10 x 1 each active LLE mass extension from mass flexed position, active assistive hip abduction, adduction, short arc quad knee extension; resisted PF.  10 x 1 each RLE hip extension and minimal PF from mass flexed position (little knee extension noted); passive hip abduction, active assistive hip adduction.  Pt attempted pelvic tilt after set-up and assist with LE positioning; none detected.  Pt very fatigued at end of session.     General: pt  positioned in R sidelying position at end of session; nursing tech informed that pt had small amount liquid BM in brief at end of session.   Pain Pain Assessment Pain Assessment: 0-10 Pain Score:   8 Pain Location: Buttocks Pain Descriptors: Burning;Constant Pain Intervention(s): RN made aware       Therapy/Group: Individual Therapy  Shanah Guimaraes 02/05/2011, 4:04 PM

## 2011-02-05 NOTE — Progress Notes (Signed)
Patient ID: Ricky Bradley, male   DOB: 1933/10/19, 77 y.o.   MRN: [id number] Patient ID: Ricky Bradley, male   DOB: [birth year], 77 y.o.   MRN: [id number] Patient ID: Ricky Bradley, male   DOB: Jul 24, 1933, 77 y.o.   MRN: [id number] Subjective/Complaints: Incont of stool with reduced sensation per nsg, establishing bowel program.  Minor irritation with C collar Review of Systems  HENT: Positive for neck pain.   Gastrointestinal:       Still feels a little bloated.  Appetite reasonable though  Musculoskeletal: Positive for back pain.  All other systems reviewed and are negative.   Objective: Vital Signs: Blood pressure 114/77, pulse 91, temperature 98.8 F (37.1 C), temperature source Oral, resp. rate 16, weight 71.4 kg (157 lb 6.5 oz), SpO2 97.00%. No results found. Results for orders placed during the hospital encounter of 01/27/11 (from the past 72 hour(s))  GLUCOSE, CAPILLARY     Status: Abnormal   Collection Time   02/02/11 11:59 AM      Component Value Range Comment   Glucose-Capillary 171 (*) 70 - 99 (mg/dL)    Comment 1 Notify RN     GLUCOSE, CAPILLARY     Status: Abnormal   Collection Time   02/02/11  5:06 PM      Component Value Range Comment   Glucose-Capillary 167 (*) 70 - 99 (mg/dL)   GLUCOSE, CAPILLARY     Status: Abnormal   Collection Time   02/02/11  9:56 PM      Component Value Range Comment   Glucose-Capillary 164 (*) 70 - 99 (mg/dL)   GLUCOSE, CAPILLARY     Status: Abnormal   Collection Time   02/03/11  1:19 PM      Component Value Range Comment   Glucose-Capillary 212 (*) 70 - 99 (mg/dL)    Comment 1 Documented in Chart     GLUCOSE, CAPILLARY     Status: Abnormal   Collection Time   02/03/11  4:36 PM      Component Value Range Comment   Glucose-Capillary 143 (*) 70 - 99 (mg/dL)   GLUCOSE, CAPILLARY     Status: Abnormal   Collection Time   02/03/11  9:02 PM      Component Value Range Comment   Glucose-Capillary 269 (*) 70 - 99 (mg/dL)    Comment 1 Notify RN      GLUCOSE, CAPILLARY     Status: Abnormal   Collection Time   02/04/11  7:35 AM      Component Value Range Comment   Glucose-Capillary 166 (*) 70 - 99 (mg/dL)    Comment 1 Notify RN     GLUCOSE, CAPILLARY     Status: Abnormal   Collection Time   02/04/11 11:36 AM      Component Value Range Comment   Glucose-Capillary 202 (*) 70 - 99 (mg/dL)    Comment 1 Notify RN     GLUCOSE, CAPILLARY     Status: Abnormal   Collection Time   02/04/11  4:16 PM      Component Value Range Comment   Glucose-Capillary 168 (*) 70 - 99 (mg/dL)    Comment 1 Notify RN     GLUCOSE, CAPILLARY     Status: Abnormal   Collection Time   02/04/11  9:17 PM      Component Value Range Comment   Glucose-Capillary 226 (*) 70 - 99 (mg/dL)   GLUCOSE, CAPILLARY     Status: Abnormal  Collection Time   02/05/11  7:32 AM      Component Value Range Comment   Glucose-Capillary 175 (*) 70 - 99 (mg/dL)    Comment 1 Notify RN      Physical Exam  Constitutional: He is oriented to person, place, and time. Appears fatigued HENT:  Head: Normocephalic.  Neck:  Collar in place  Cardiovascular: Normal rate.  Pulmonary/Chest: Breath sounds normal. He has no wheezes.  Abdominal: He exhibits no distension. There is no tenderness. Bowel sounds are active. Musculoskeletal: He exhibits no edema.  Neurological: He is alert and oriented to person, place, and time. A sensory deficit is present. Coordination reduced in BUEs Patient with 4/5 strength in R upper extremity, 3/5 on Left. HI are improving and now 3-3+/5. Marland Kitchen Uses tenodesis grip to improve functional grasp. He had some diminishment of his fine touch in the hands right more than left. In the lower extremities strength was tr/5 proximally at the right hip flexors 1-tr at the right knee extensors and flexors and to tr-1/5 right at the ankle dorsiflexors and plantar flexors. Left leg 1-1+/5 generally (left is a little stronger than right leg). He had some diminishment of his leg  sensation at 1 plus out of 2. Fine motor coordination of the upper extremities was fair. Cognitively he was alert and oriented x 3 with extra time, but was slow to process . CN exam unremarkable 2-12. DTR's 2+ in lower ext's and resting tone at knees and ankles of 1+ /4.  Skin: Surgical site clean. C-collar fitting appropriately. Erythema and mild breakdown at sacrum with EPBC in place Psychiatric: pleasant, but flat    Assessment/Plan: 1. Functional deficits secondary to Cervical myelopathy causing tetraplegia C7 ASIA C which require 3+ hours per day of interdisciplinary therapy in a comprehensive inpatient rehab setting. Physiatrist is providing close team supervision and 24 hour management of active medical problems listed below. Physiatrist and rehab team continue to assess barriers to discharge/monitor patient progress toward functional and medical goals.  Spoke to daughter at length about rehab progress and expectations going forward.  Mobility: Bed Mobility Bed Mobility: Yes Rolling Right: 2: Max assist Rolling Right Details (indicate cue type and reason): Cues for UE use, A for bil LEs Rolling Left: 2: Max assist Rolling Left Details (indicate cue type and reason): Cues for UE use, A for bil LEs Left Sidelying to Sit: 1: +1 Total assist;HOB flat Left Sidelying to Sit Details (indicate cue type and reason): Pt able to A with bil UEs, but A needed for trunk and LEs Sitting - Scoot to Edge of Bed: 2: Max assist Sitting - Scoot to [place] of Bed Details (indicate cue type and reason): Cues for hand placement and timing, A for anterior translation at hips Sit to Supine - Right: 1: +2 Total assist;HOB flat Scooting to HOB: 1: +2 Total assist Scooting to [facility] Details (indicate cue type and reason): A to position LEs in hooklying and cues to lift hips. A for scooting Transfers Transfers: Yes Sit to Stand: 1: +2 Total assist;With upper extremity assist;From bed Sit to Stand Details (indicate  cue type and reason): A needed for lifting and lowering. Pt unable to clear hps from bed without +2 A. Tried Sara Plus, but increased thoracic pain, so stopped.  Stand to Sit: 1: +2 Total assist;With upper extremity assist;To bed Ambulation/Gait Ambulation/Gait Assistance: Not tested (comment) Stairs: No Wheelchair Mobility Wheelchair Mobility: No Distance: 40  ADL:    Cognition: Cognition Overall Cognitive  Status: Appears within functional limits for tasks assessed Arousal/Alertness: Awake/alert Orientation Level: Oriented X4 Attention: Sustained Sustained Attention: Appears intact Memory: Appears intact Awareness: Appears intact Problem Solving: Appears intact Safety/Judgment: Appears intact Cognition Arousal/Alertness: Awake/alert Orientation Level: Oriented X4      Medical problem list  1.Cervical Myelopathy-S/Pcervical diskectomy with decompression 12/3.Cervical collar at all times.Decadron taper--need to decide on schedule.  2. DVT Prophylaxis/Anticoagulation: SCD/support hose.No current signs of DVT. Dopplers are negative.   3. Pain Management: Oxycodone/robaxin.Monitor with increased activity.    4. Neurogenic bladder and hematuria/BPH-.Foley tube in place with hematuria resolving.Continue flomax for now.Empiric keflex 12/7 and plan follow up urine study.Will discuss voiding trial with urology services. Needs to keep foley in regardless to protect skin also  5. Neurogenic bowel: -am bowel  Program  -fibercon and dulcolax   5.Steriod induced hyperglycemia-check blood sugars ac/hs and monitor as taper steroids. Cover with SSI.  Increase lantus today until we decide on taper and because PO intake appears to be picking up.  6.Depression-celexa daily.Provide emotional support.   7.GERD-protonix   8.Hyperlipidemia-zocor  9  Rogelia Boga 02/05/2011, 8:43 AM

## 2011-02-05 NOTE — Progress Notes (Signed)
Occupational Therapy Session Note  Patient Details  Name: EMILE KYLLO MRN: [id number] Date of Birth: 02/03/1934  Today's Date: 02/05/2011 Time: 0900-0955 Time Calculation (min): 55 min  Precautions: Aspen collar on at all times  Skilled Therapeutic Interventions/Progress Updates:    Patient seen this am for bathing and dressing.  Patient completed lower body care from bed level, then up to wheelchair via Beasy board to complete upper body care.  Patient with report of vertigo with rolling to the right, and after transitioning from sidelyng to sitting.  Symptoms lasted for 2 minutes.  Patient experienced a significant change in breathing pattern with each transition to a new position.  Patient slightly short of breath, and attributed this to feeling nervous.  Spoke with RN regarding skin care to perianal region as buttocks extremely reddened, and sore.    Pain Pain Assessment Pain Score:   6    Therapy/Group: Individual Therapy  Collier Salina 02/05/2011, 11:54 AM

## 2011-02-05 NOTE — Progress Notes (Signed)
Pt alert/oriented x 3, short term memory deficits noted, bed alarm, safety belt in use, bilateral upper extremities with limited ROM, bilateral lower extremities with paralysis, able to wiggle toes on left leg, no sensations present to both legs, staff to turn Q 2 hour in bed,. Staff to set up meal trays , transfers with 2+ with slide board to wheelchair, incontinent of bowel, small amt stool noted after suppository this am, and incontinent of small amount stool x 3 today, perineal area with pink to site, using Anusol  To rectum , barrier cream to sacral area, scrotum with pink  , will continue with plan of care

## 2011-02-05 NOTE — Progress Notes (Signed)
Physical Therapy Session Note  Patient Details  Name: Ricky Bradley MRN: [id number] Date of Birth: 11-03-1933  Today's Date: 02/05/2011 Time: 1000-1100 Time Calculation (min): 60 min  Precautions: Precautions Precautions: Fall Precaution Comments: cervical precautions Required Braces or Orthoses: Yes Cervical Brace: Hard collar;Applied in supine position Restrictions Weight Bearing Restrictions: No  Short Term Goals: PT Short Term Goal 1: Perform all bed mobility Max A  PT Short Term Goal 1 - Progress: Progressing toward goal PT Short Term Goal 2: Perform all functional transfers Max A  PT Short Term Goal 2 - Progress: Progressing toward goal PT Short Term Goal 3: Maintain static sitting balance with Min PT Short Term Goal 3 - Progress: Progressing toward goal PT Short Term Goal 4: Maintain static standing balance Mod A PT Short Term Goal 4 - Progress: Progressing toward goal PT Short Term Goal 5: WC mobility Mod A x 50' PT Short Term Goal 5 - Progress: Progressing toward goal  Skilled Therapeutic Interventions/Progress Updates:    Patient is total assist of 1 to transfer wheelchair to mat with beezy board. Patient worked on sitting balance edge of mat with bilateral UE support. Patient can maintain static sitting x5 minutes with bilateral UE support. Worked on balance while moving one hand up to table and moving each hand side to side and forward on table while maintaining balance. Also worked on side leaning each direction weight bearing on elbow.  Pt transferred back to bed and left in sidelying with call bell in reach.   Pain Pain Assessment Pain Assessment: 0-10 Pain Score:   5 Pain Location: Head Pain Descriptors: Therapist, art Distance: 60   Therapy/Group: Individual Therapy  Arelia Longest M 02/05/2011, 12:08 PM

## 2011-02-05 NOTE — Progress Notes (Signed)
Occupational Therapy Note  Patient Details  Name: Ricky Bradley MRN: [id number] Date of Birth: 04/25/1933 Today's Date: 02/05/2011 1330-1350 20 min Pain: Missed 10 minutes secondary to belly pain.  RN aware.  Suppository recently administered. Individual Treatment  Skilled clinical intervention:  1:1 OT session scheduled this afternoon.  Patient with limited ability to participate secondary to belly pain.  Patient assisted to roll onto right to reduce pain.  Patient encouraged to try and get up to wheelchair.  Patient uncomfortable, with multiple symptoms;  Too warm (feet uncovered, covers pulled back), throat too dry (water offered), requesting to sit up (brace feels like it is choking him), assisted to comfortable position.  RN and PT aware of limited participation this pm.      Collier Salina 02/05/2011, 2:59 PM

## 2011-02-06 LAB — URINALYSIS, ROUTINE W REFLEX MICROSCOPIC
Bilirubin Urine: NEGATIVE
Ketones, ur: NEGATIVE mg/dL
Protein, ur: NEGATIVE mg/dL
Urobilinogen, UA: 1 mg/dL (ref 0.0–1.0)

## 2011-02-06 LAB — GLUCOSE, CAPILLARY
Glucose-Capillary: 146 mg/dL — ABNORMAL HIGH (ref 70–99)
Glucose-Capillary: 120 mg/dL — ABNORMAL HIGH (ref 70–99)
Glucose-Capillary: 220 mg/dL — ABNORMAL HIGH (ref 70–99)
Glucose-Capillary: 233 mg/dL — ABNORMAL HIGH (ref 70–99)

## 2011-02-06 MED ORDER — SACCHAROMYCES BOULARDII 250 MG PO CAPS
500.0000 mg | ORAL_CAPSULE | Freq: Two times a day (BID) | ORAL | Status: DC
  Administered 2011-02-06 – 2011-02-24 (×36): 500 mg via ORAL
  Filled 2011-02-06 (×38): qty 2

## 2011-02-06 MED ORDER — DEXAMETHASONE 0.5 MG PO TABS
1.0000 mg | ORAL_TABLET | Freq: Three times a day (TID) | ORAL | Status: DC
  Administered 2011-02-06 – 2011-02-09 (×9): 1 mg via ORAL
  Filled 2011-02-06 (×12): qty 2

## 2011-02-06 NOTE — Progress Notes (Signed)
Physical Therapy Note  Patient Details  Name: Ricky Bradley MRN: [id number] Date of Birth: March 05, 1933 Today's Date: 02/06/2011  1100-1155 (55 minutes) individual treatment Pain : no complaint of pain/ premedicated Focus of treatment: transfer training ; bilateral UE and trunk strengthening Transfers: +2 total assist sliding board wc to bed/bed to wc  Treatment: Seated forward/backward trunk movement (active) using bedside table in front of wc; bilateral UE shoulder depression, adduction, elbow flexion/extension using yellow theraband  Wc mobility: pt propelled wc from room to gym (140 feet)  using bilateral UEs  With one rest break Ricky Bradley,JIM 02/06/2011, 12:40 PM

## 2011-02-06 NOTE — Progress Notes (Signed)
Patient ID: Ricky Bradley, male   DOB: 01/22/1934, 77 y.o.   MRN: [id number] Patient ID: Ricky Bradley, male   DOB: 27-Jul-1933, 76 y.o.   MRN: [id number] Patient ID: Ricky Bradley, male   DOB: 08/28/1933, 77 y.o.   MRN: [id number] Patient ID: Ricky Bradley, male   DOB: 03-27-1933, 77 y.o.   MRN: [id number] Subjective/Complaints: Incont of stool with reduced sensation per nsg.  complaints of hypopgastric abdominal pain.Review of Systems  HENT: Positive for neck pain.   Gastrointestinal:       Still feels a little bloated.  Appetite reasonable though  Musculoskeletal: Positive for back pain.  All other systems reviewed and are negative.   Objective: Vital Signs: Blood pressure 109/72, pulse 95, temperature 97.7 F (36.5 C), temperature source Oral, resp. rate 18, weight 71.4 kg (157 lb 6.5 oz), SpO2 98.00%. No results found. Results for orders placed during the hospital encounter of 01/27/11 (from the past 72 hour(s))  GLUCOSE, CAPILLARY     Status: Abnormal   Collection Time   02/03/11  1:19 PM      Component Value Range Comment   Glucose-Capillary 212 (*) 70 - 99 (mg/dL)    Comment 1 Documented in Chart     GLUCOSE, CAPILLARY     Status: Abnormal   Collection Time   02/03/11  4:36 PM      Component Value Range Comment   Glucose-Capillary 143 (*) 70 - 99 (mg/dL)   GLUCOSE, CAPILLARY     Status: Abnormal   Collection Time   02/03/11  9:02 PM      Component Value Range Comment   Glucose-Capillary 269 (*) 70 - 99 (mg/dL)    Comment 1 Notify RN     GLUCOSE, CAPILLARY     Status: Abnormal   Collection Time   02/04/11  7:35 AM      Component Value Range Comment   Glucose-Capillary 166 (*) 70 - 99 (mg/dL)    Comment 1 Notify RN     GLUCOSE, CAPILLARY     Status: Abnormal   Collection Time   02/04/11 11:36 AM      Component Value Range Comment   Glucose-Capillary 202 (*) 70 - 99 (mg/dL)    Comment 1 Notify RN     GLUCOSE, CAPILLARY     Status: Abnormal   Collection Time   02/04/11  4:16  PM      Component Value Range Comment   Glucose-Capillary 168 (*) 70 - 99 (mg/dL)    Comment 1 Notify RN     GLUCOSE, CAPILLARY     Status: Abnormal   Collection Time   02/04/11  9:17 PM      Component Value Range Comment   Glucose-Capillary 226 (*) 70 - 99 (mg/dL)   GLUCOSE, CAPILLARY     Status: Abnormal   Collection Time   02/05/11  7:32 AM      Component Value Range Comment   Glucose-Capillary 175 (*) 70 - 99 (mg/dL)    Comment 1 Notify RN     GLUCOSE, CAPILLARY     Status: Abnormal   Collection Time   02/05/11 11:18 AM      Component Value Range Comment   Glucose-Capillary 171 (*) 70 - 99 (mg/dL)    Comment 1 Notify RN     GLUCOSE, CAPILLARY     Status: Abnormal   Collection Time   02/05/11  4:49 PM      Component Value Range Comment  Glucose-Capillary 162 (*) 70 - 99 (mg/dL)    Comment 1 Notify RN     GLUCOSE, CAPILLARY     Status: Abnormal   Collection Time   02/05/11  9:05 PM      Component Value Range Comment   Glucose-Capillary 245 (*) 70 - 99 (mg/dL)   GLUCOSE, CAPILLARY     Status: Abnormal   Collection Time   02/06/11  7:21 AM      Component Value Range Comment   Glucose-Capillary 146 (*) 70 - 99 (mg/dL)    Comment 1 Notify RN      Physical Exam  Constitutional: He is oriented to person, place, and time. Appears fatigued HENT:  Head: Normocephalic.  Neck:  Collar in place  Cardiovascular: Normal rate.  Pulmonary/Chest: Breath sounds normal. He has no wheezes.  Abdominal: He exhibits no distension. There is no tenderness. Bowel sounds are active. Musculoskeletal: He exhibits no edema.  Neurological: He is alert and oriented to person, place, and time. A sensory deficit is present. Coordination reduced in BUEs Patient with 4/5 strength in R upper extremity, 3/5 on Left. HI are improving and now 3-3+/5. Marland Kitchen Uses tenodesis grip to improve functional grasp. He had some diminishment of his fine touch in the hands right more than left. In the lower extremities  strength was tr/5 proximally at the right hip flexors 1-tr at the right knee extensors and flexors and to tr-1/5 right at the ankle dorsiflexors and plantar flexors. Left leg 1-1+/5 generally (left is a little stronger than right leg). He had some diminishment of his leg sensation at 1 plus out of 2. Fine motor coordination of the upper extremities was fair. Cognitively he was alert and oriented x 3 with extra time, but was slow to process . CN exam unremarkable 2-12. DTR's 2+ in lower ext's and resting tone at knees and ankles of 1+ /4.  Skin: Surgical site clean. C-collar fitting appropriately. Erythema and mild breakdown at sacrum with EPBC in place Psychiatric: pleasant, but flat    Assessment/Plan: 1. Functional deficits secondary to Cervical myelopathy causing tetraplegia C7 ASIA C which require 3+ hours per day of interdisciplinary therapy in a comprehensive inpatient rehab setting. Physiatrist is providing close team supervision and 24 hour management of active medical problems listed below. Physiatrist and rehab team continue to assess barriers to discharge/monitor patient progress toward functional and medical goals.  Spoke to daughter at length about rehab progress and expectations going forward.  Mobility: Bed Mobility Bed Mobility: Yes Rolling Right: 2: Max assist Rolling Right Details (indicate cue type and reason): Cues for UE use, A for bil LEs Rolling Left: 2: Max assist Rolling Left Details (indicate cue type and reason): Cues for UE use, A for bil LEs Left Sidelying to Sit: 1: +1 Total assist;HOB flat Left Sidelying to Sit Details (indicate cue type and reason): Pt able to A with bil UEs, but A needed for trunk and LEs Sitting - Scoot to Edge of Bed: 2: Max assist Sitting - Scoot to [place] of Bed Details (indicate cue type and reason): Cues for hand placement and timing, A for anterior translation at hips Sit to Supine - Right: 1: +2 Total assist;HOB flat Scooting to HOB: 1:  +2 Total assist Scooting to [facility] Details (indicate cue type and reason): A to position LEs in hooklying and cues to lift hips. A for scooting Transfers Transfers: Yes Sit to Stand: 1: +2 Total assist;With upper extremity assist;From bed Sit to Stand Details (  indicate cue type and reason): A needed for lifting and lowering. Pt unable to clear hps from bed without +2 A. Tried Sara Plus, but increased thoracic pain, so stopped.  Stand to Sit: 1: +2 Total assist;With upper extremity assist;To bed Ambulation/Gait Ambulation/Gait Assistance: Not tested (comment) Stairs: No Wheelchair Mobility Wheelchair Mobility: No Distance: 60  ADL:    Cognition: Cognition Overall Cognitive Status: Appears within functional limits for tasks assessed Arousal/Alertness: Awake/alert Orientation Level: Oriented X4 Attention: Sustained Sustained Attention: Appears intact Memory: Appears intact Awareness: Appears intact Problem Solving: Appears intact Safety/Judgment: Appears intact Cognition Arousal/Alertness: Awake/alert Orientation Level: Oriented X4      Medical problem list  1.Cervical Myelopathy-S/Pcervical diskectomy with decompression 12/3.Cervical collar at all times.Decadron taper--need to decide on schedule.  2. DVT Prophylaxis/Anticoagulation: SCD/support hose.No current signs of DVT. Dopplers are negative.   3. Pain Management: Oxycodone/robaxin.Monitor with increased activity.    4. Neurogenic bladder and hematuria/BPH-.Foley tube in place with hematuria resolving.Continue flomax for now.Empiric keflex 12/7 and plan follow up urine study.Will discuss voiding trial with urology services. Needs to keep foley in regardless to protect skin also  5. Neurogenic bowel: -am bowel  Program  -fibercon and dulcolax  -increased florastor  -explained to patient that some of pain is likely neurologic/sensory loss.  -his abdomen is soft  -will check urine today.  -is on gi prophylaxis  -will  decrease steroids    5.Steriod induced hyperglycemia-check blood sugars ac/hs and monitor as taper steroids. Cover with SSI.    6.Depression-celexa daily.Provide emotional support.   7.GERD-protonix   8.Hyperlipidemia-zocor  10  Ricky Bradley,Ricky Bradley T 02/06/2011, 9:23 AM

## 2011-02-06 NOTE — Progress Notes (Signed)
Alert, taking meds whole in applesauce, note gas using simethicone for relief.  Morning bowel program of supp yield small results of soft pasty stool on bedpan, requires staff to clean and mange clothes.  Foley intact that staff manage, UA, C+S sent to lab per order, staff place anusol on rectum due to hemmorhoids, continue skin care creams and microguard powder to peri area due to redness,  limited ROM bil UE/ aspen collor on steristrips to incision.  Pain management with prn medication.  LFt lower ext limited movement of toes and dorsiflex foot, no movement of rt LE noted, poor fine motor movement of UE/hands using adaptive equipment for utensils to feed self, needs little extra time to complete meals.  CBGs WNL, using SSI and Lantus insulin.  No other change in assessment, contue plan of care. Ricky Bradley

## 2011-02-06 NOTE — Progress Notes (Signed)
Occupational Therapy Session Note  Patient Details  Name: Ricky Bradley MRN: [id number] Date of Birth: October 16, 1933  Today's Date: 02/06/2011  1st Session Time: 1000-1100 Time Calculation (min): 60 min  Precautions: Precautions Precautions: Fall Precaution Comments: cervical precautions Required Braces or Orthoses: Yes Cervical Brace: Hard collar;Applied in supine position Restrictions Weight Bearing Restrictions: No   Skilled Therapeutic Interventions/Progress Updates:    ADL retraining including LB bathing and dressing supine with HOB elevated and UB bathing and dressing seated in w/c at sink.  Pt continues to exhibit decreased sitting balance and requires assistance to correct balance when sitting with HOB elevated.  Sit to supine requires max A with mod verbal cues for hand placement.  Pt supervision for static sitting balance with BUE support and max A without BUE support.  Beasy Board transfer to w/c with tot A.  Pt completes UB bathing and dressing seated with support in w/c at sink.  Pt able to lean forward with assistance from BUE on sink.  Pt continues to requires assistance with buttons secondary to decreased manipulation and fine motor skills.  Focus on activity tolerance, bed mobility, and sitting balance.    Pain Pain Assessment Pain Assessment: 0-10 Pain Score:   2 Pain Location: Abdomen Pain Descriptors: Discomfort Pain Intervention(s): RN made aware  Therapy/Group: Individual Therapy   2nd Session Time: 1400-1430 Pt denies pain but c/o discomfort in abdominal area, RN aware Individual Therapy  Pt engaged in fine motor skill and manipulation tasks in supported sitting in w/c to increase independence with bathing, dressing, and self feeding tasks. Pt stated he believed he had a bowel movement and asked to be "checked."  Therapist assisted pt to bed - total A for sliding board transfer.  Nsg notified of patients request.    [name] 02/06/2011,  11:06 AM

## 2011-02-06 NOTE — Progress Notes (Signed)
Physical Therapy Note  Patient Details  Name: SHYLOH KRINKE MRN: [id number] Date of Birth: 1933-05-28 Today's Date: 02/06/2011  1300-1350 (50 minutes) individual trreatment Pain: no complaint of pain/premedicated Focus of treatment: Therapeutic activities to improve sitting balance Treatment: Pt requires max assist to attain midline static sitting balance on firm surface; Pt can maintain midline position using bilateral UEs  Edge of mat for approximately 3 - 4 minutes before fatigue. Rhythmic stabilization exercises followed by static sitting using blilateral  UE assist on ball in front of patient with cues for weight shifts to facilitate balance reactions and trunk strengthening (3 trials of 8 minutes) wc mobility: 120 feet X 2 using UEs without rest break   Rumeal Cullipher,JIM 02/06/2011, 2:33 PM

## 2011-02-07 LAB — GLUCOSE, CAPILLARY
Glucose-Capillary: 252 mg/dL — ABNORMAL HIGH (ref 70–99)
Glucose-Capillary: 117 mg/dL — ABNORMAL HIGH (ref 70–99)

## 2011-02-07 MED ORDER — FLUCONAZOLE 200 MG PO TABS
200.0000 mg | ORAL_TABLET | Freq: Once | ORAL | Status: DC
  Filled 2011-02-07: qty 1

## 2011-02-07 NOTE — Progress Notes (Signed)
Patient ID: Ricky Bradley, male   DOB: 18-Jun-1933, 77 y.o.   MRN: [id number] Patient ID: Ricky Bradley, male   DOB: February 26, 1933, 77 y.o.   MRN: [id number] Patient ID: Ricky Bradley, male   DOB: Aug 17, 1933, 77 y.o.   MRN: [id number] Patient ID: Ricky Bradley, male   DOB: 11/03/1933, 77 y.o.   MRN: [id number] Patient ID: Ricky Bradley, male   DOB: Jan 03, 1934, 77 y.o.   MRN: [id number] Subjective/Complaints: Abdominal discomfort a little better.Review of Systems  HENT: Positive for neck pain.   Gastrointestinal:       Still feels a little bloated.  Appetite reasonable though  Musculoskeletal: Positive for back pain.  All other systems reviewed and are negative.   Objective: Vital Signs: Blood pressure 123/73, pulse 92, temperature 98.5 F (36.9 C), temperature source Oral, resp. rate 20, weight 71.4 kg (157 lb 6.5 oz), SpO2 99.00%. No results found. Results for orders placed during the hospital encounter of 01/27/11 (from the past 72 hour(s))  GLUCOSE, CAPILLARY     Status: Abnormal   Collection Time   02/04/11  7:35 AM      Component Value Range Comment   Glucose-Capillary 166 (*) 70 - 99 (mg/dL)    Comment 1 Notify RN     GLUCOSE, CAPILLARY     Status: Abnormal   Collection Time   02/04/11 11:36 AM      Component Value Range Comment   Glucose-Capillary 202 (*) 70 - 99 (mg/dL)    Comment 1 Notify RN     GLUCOSE, CAPILLARY     Status: Abnormal   Collection Time   02/04/11  4:16 PM      Component Value Range Comment   Glucose-Capillary 168 (*) 70 - 99 (mg/dL)    Comment 1 Notify RN     GLUCOSE, CAPILLARY     Status: Abnormal   Collection Time   02/04/11  9:17 PM      Component Value Range Comment   Glucose-Capillary 226 (*) 70 - 99 (mg/dL)   GLUCOSE, CAPILLARY     Status: Abnormal   Collection Time   02/05/11  7:32 AM      Component Value Range Comment   Glucose-Capillary 175 (*) 70 - 99 (mg/dL)    Comment 1 Notify RN     GLUCOSE, CAPILLARY     Status: Abnormal   Collection Time   02/05/11 11:18 AM      Component Value Range Comment   Glucose-Capillary 171 (*) 70 - 99 (mg/dL)    Comment 1 Notify RN     GLUCOSE, CAPILLARY     Status: Abnormal   Collection Time   02/05/11  4:49 PM      Component Value Range Comment   Glucose-Capillary 162 (*) 70 - 99 (mg/dL)    Comment 1 Notify RN     GLUCOSE, CAPILLARY     Status: Abnormal   Collection Time   02/05/11  9:05 PM      Component Value Range Comment   Glucose-Capillary 245 (*) 70 - 99 (mg/dL)   GLUCOSE, CAPILLARY     Status: Abnormal   Collection Time   02/06/11  7:21 AM      Component Value Range Comment   Glucose-Capillary 146 (*) 70 - 99 (mg/dL)    Comment 1 Notify RN     GLUCOSE, CAPILLARY     Status: Abnormal   Collection Time   02/06/11 11:41 AM  Component Value Range Comment   Glucose-Capillary 120 (*) 70 - 99 (mg/dL)    Comment 1 Notify RN     URINALYSIS, ROUTINE W REFLEX MICROSCOPIC     Status: Abnormal   Collection Time   02/06/11  3:00 PM      Component Value Range Comment   Color, Urine YELLOW  YELLOW     APPearance CLOUDY (*) CLEAR     Specific Gravity, Urine 1.018  1.005 - 1.030     pH 6.0  5.0 - 8.0     Glucose, UA NEGATIVE  NEGATIVE (mg/dL)    Hgb urine dipstick MODERATE (*) NEGATIVE     Bilirubin Urine NEGATIVE  NEGATIVE     Ketones, ur NEGATIVE  NEGATIVE (mg/dL)    Protein, ur NEGATIVE  NEGATIVE (mg/dL)    Urobilinogen, UA 1.0  0.0 - 1.0 (mg/dL)    Nitrite POSITIVE (*) NEGATIVE     Leukocytes, UA LARGE (*) NEGATIVE    URINE MICROSCOPIC-ADD ON     Status: Abnormal   Collection Time   02/06/11  3:00 PM      Component Value Range Comment   Squamous Epithelial / LPF RARE  RARE     WBC, UA TOO NUMEROUS TO COUNT  <3 (WBC/hpf)    RBC / HPF 11-20  <3 (RBC/hpf)    Bacteria, UA MANY (*) RARE     Urine-Other MANY YEAST   MUCOUS PRESENT  GLUCOSE, CAPILLARY     Status: Abnormal   Collection Time   02/06/11  6:58 PM      Component Value Range Comment   Glucose-Capillary 220 (*) 70 - 99  (mg/dL)    Comment 1 Notify RN     GLUCOSE, CAPILLARY     Status: Abnormal   Collection Time   02/06/11  9:09 PM      Component Value Range Comment   Glucose-Capillary 233 (*) 70 - 99 (mg/dL)    Comment 1 Notify RN      Physical Exam  Constitutional: He is oriented to person, place, and time. Appears fatigued HENT:  Head: Normocephalic.  Neck:  Collar in place  Cardiovascular: Normal rate.  Pulmonary/Chest: Breath sounds normal. He has no wheezes.  Abdominal: He exhibits no distension. There is no tenderness. Bowel sounds are active. Musculoskeletal: He exhibits no edema.  Neurological: He is alert and oriented to person, place, and time. A sensory deficit is present. Coordination reduced in BUEs Patient with 4/5 strength in R upper extremity, 3/5 on Left. HI are improving and now 3-3+/5. Marland Kitchen Uses tenodesis grip to improve functional grasp. He had some diminishment of his fine touch in the hands right more than left. In the lower extremities strength was tr/5 proximally at the right hip flexors 1-tr at the right knee extensors and flexors and to tr-1/5 right at the ankle dorsiflexors and plantar flexors. Left leg 1-1+/5 generally (left is a little stronger than right leg). He had some diminishment of his leg sensation at 1 plus out of 2. Fine motor coordination of the upper extremities was fair. Cognitively he was alert and oriented x 3 with extra time, but was slow to process . CN exam unremarkable 2-12. DTR's 2+ in lower ext's and resting tone at knees and ankles of 1+ /4.  Skin: Surgical site clean. C-collar fitting appropriately. Erythema and mild breakdown at sacrum with EPBC in place Psychiatric: pleasant, but flat    Assessment/Plan: 1. Functional deficits secondary to Cervical myelopathy causing tetraplegia C7  ASIA C which require 3+ hours per day of interdisciplinary therapy in a comprehensive inpatient rehab setting. Physiatrist is providing close team supervision and 24 hour  management of active medical problems listed below. Physiatrist and rehab team continue to assess barriers to discharge/monitor patient progress toward functional and medical goals.  Spoke to daughter at length about rehab progress and expectations going forward.  Mobility: Bed Mobility Bed Mobility: Yes Rolling Right: 2: Max assist Rolling Right Details (indicate cue type and reason): Cues for UE use, A for bil LEs Rolling Left: 2: Max assist Rolling Left Details (indicate cue type and reason): Cues for UE use, A for bil LEs Left Sidelying to Sit: 1: +1 Total assist;HOB flat Left Sidelying to Sit Details (indicate cue type and reason): Pt able to A with bil UEs, but A needed for trunk and LEs Sitting - Scoot to Edge of Bed: 2: Max assist Sitting - Scoot to [place] of Bed Details (indicate cue type and reason): Cues for hand placement and timing, A for anterior translation at hips Sit to Supine - Right: 1: +2 Total assist;HOB flat Scooting to HOB: 1: +2 Total assist Scooting to [facility] Details (indicate cue type and reason): A to position LEs in hooklying and cues to lift hips. A for scooting Transfers Transfers: Yes Sit to Stand: 1: +2 Total assist;With upper extremity assist;From bed Sit to Stand Details (indicate cue type and reason): A needed for lifting and lowering. Pt unable to clear hps from bed without +2 A. Tried Sara Plus, but increased thoracic pain, so stopped.  Stand to Sit: 1: +2 Total assist;With upper extremity assist;To bed Ambulation/Gait Ambulation/Gait Assistance: Not tested (comment) Stairs: No Wheelchair Mobility Wheelchair Mobility: No Distance: 60  ADL:    Cognition: Cognition Overall Cognitive Status: Appears within functional limits for tasks assessed Arousal/Alertness: Awake/alert Orientation Level: Oriented X4;Oriented to person Attention: Sustained Sustained Attention: Appears intact Memory: Appears intact Awareness: Appears intact Problem Solving:  Appears intact Safety/Judgment: Appears intact Cognition Arousal/Alertness: Awake/alert Orientation Level: Oriented X4;Oriented to person      Medical problem list  1.Cervical Myelopathy-S/Pcervical diskectomy with decompression 12/3.Cervical collar at all times.Decadron taper--need to decide on schedule.  2. DVT Prophylaxis/Anticoagulation: SCD/support hose.No current signs of DVT. Dopplers are negative.   3. Pain Management: Oxycodone/robaxin.Monitor with increased activity.    4. Neurogenic bladder and hematuria/BPH-.Foley tube in place with hematuria resolving.Continue flomax for now.Empiric keflex 12/7 and plan follow up urine study.Will discuss voiding trial with urology services. Needs to keep foley in regardless to protect skin also  5. Neurogenic bowel/hypogastric discomfort: -am bowel  Program  -fibercon and dulcolax  -increased florastor  -explained to patient that some of pain is likely neurologic/sensory loss.  -his abdomen is soft  -urinalysis is positive with yeast- begin diflucan.  Await culture  -is on gi prophylaxis  -will decrease steroids    5.Steriod induced hyperglycemia-check blood sugars ac/hs and monitor as taper steroids. Cover with SSI and rx UTI  6.Depression-celexa daily.Provide emotional support.   7.GERD-protonix   8.Hyperlipidemia-zocor  11  Lauran Romanski T 02/07/2011, 5:48 AM

## 2011-02-07 NOTE — Progress Notes (Signed)
Pt alert and oriented.  Pt bathed in bed today.  Pt refused to get out of bed,  Pt repositioned every 2 hours.  Pt had a small Bowel movement after suppository today, no stool felt in rectum. Pt denies pain.  Pt visiting at bedside today Ricky Bradley

## 2011-02-08 LAB — GLUCOSE, CAPILLARY
Glucose-Capillary: 112 mg/dL — ABNORMAL HIGH (ref 70–99)
Glucose-Capillary: 81 mg/dL (ref 70–99)
Glucose-Capillary: 79 mg/dL (ref 70–99)

## 2011-02-08 MED ORDER — TRAZODONE HCL 50 MG PO TABS
50.0000 mg | ORAL_TABLET | Freq: Every evening | ORAL | Status: DC | PRN
  Administered 2011-02-15 – 2011-02-20 (×2): 50 mg via ORAL
  Filled 2011-02-08 (×2): qty 1

## 2011-02-08 NOTE — Progress Notes (Signed)
Subjective/Complaints: Abdominal discomfort a little better.  A little restless last night  Review of Systems  HENT: Positive for neck pain.   Gastrointestinal:       Still feels a little bloated.  Appetite reasonable though  Musculoskeletal: Positive for back pain.  All other systems reviewed and are negative.   Objective: Vital Signs: Blood pressure 111/71, pulse 97, temperature 99.1 F (37.3 C), temperature source Axillary, resp. rate 18, weight 71.4 kg (157 lb 6.5 oz), SpO2 99.00%. No results found. Results for orders placed during the hospital encounter of 01/27/11 (from the past 72 hour(s))  GLUCOSE, CAPILLARY     Status: Abnormal   Collection Time   02/05/11  7:32 AM      Component Value Range Comment   Glucose-Capillary 175 (*) 70 - 99 (mg/dL)    Comment 1 Notify RN     GLUCOSE, CAPILLARY     Status: Abnormal   Collection Time   02/05/11 11:18 AM      Component Value Range Comment   Glucose-Capillary 171 (*) 70 - 99 (mg/dL)    Comment 1 Notify RN     GLUCOSE, CAPILLARY     Status: Abnormal   Collection Time   02/05/11  4:49 PM      Component Value Range Comment   Glucose-Capillary 162 (*) 70 - 99 (mg/dL)    Comment 1 Notify RN     GLUCOSE, CAPILLARY     Status: Abnormal   Collection Time   02/05/11  9:05 PM      Component Value Range Comment   Glucose-Capillary 245 (*) 70 - 99 (mg/dL)   GLUCOSE, CAPILLARY     Status: Abnormal   Collection Time   02/06/11  7:21 AM      Component Value Range Comment   Glucose-Capillary 146 (*) 70 - 99 (mg/dL)    Comment 1 Notify RN     GLUCOSE, CAPILLARY     Status: Abnormal   Collection Time   02/06/11 11:41 AM      Component Value Range Comment   Glucose-Capillary 120 (*) 70 - 99 (mg/dL)    Comment 1 Notify RN     URINALYSIS, ROUTINE W REFLEX MICROSCOPIC     Status: Abnormal   Collection Time   02/06/11  3:00 PM      Component Value Range Comment   Color, Urine YELLOW  YELLOW     APPearance CLOUDY (*) CLEAR     Specific Gravity, Urine 1.018  1.005 - 1.030     pH 6.0  5.0 - 8.0     Glucose, UA NEGATIVE  NEGATIVE (mg/dL)    Hgb urine dipstick MODERATE (*) NEGATIVE     Bilirubin Urine NEGATIVE  NEGATIVE     Ketones, ur NEGATIVE  NEGATIVE (mg/dL)    Protein, ur NEGATIVE  NEGATIVE (mg/dL)    Urobilinogen, UA 1.0  0.0 - 1.0 (mg/dL)    Nitrite POSITIVE (*) NEGATIVE     Leukocytes, UA LARGE (*) NEGATIVE    URINE MICROSCOPIC-ADD ON     Status: Abnormal   Collection Time   02/06/11  3:00 PM      Component Value Range Comment   Squamous Epithelial / LPF RARE  RARE     WBC, UA TOO NUMEROUS TO COUNT  <3 (WBC/hpf)    RBC / HPF 11-20  <3 (RBC/hpf)    Bacteria, UA MANY (*) RARE     Urine-Other MANY YEAST   MUCOUS PRESENT  GLUCOSE, CAPILLARY  Status: Abnormal   Collection Time   02/06/11  6:58 PM      Component Value Range Comment   Glucose-Capillary 220 (*) 70 - 99 (mg/dL)    Comment 1 Notify RN     GLUCOSE, CAPILLARY     Status: Abnormal   Collection Time   02/06/11  9:09 PM      Component Value Range Comment   Glucose-Capillary 233 (*) 70 - 99 (mg/dL)    Comment 1 Notify RN     GLUCOSE, CAPILLARY     Status: Abnormal   Collection Time   02/07/11  8:32 AM      Component Value Range Comment   Glucose-Capillary 252 (*) 70 - 99 (mg/dL)   GLUCOSE, CAPILLARY     Status: Abnormal   Collection Time   02/07/11 11:48 AM      Component Value Range Comment   Glucose-Capillary 117 (*) 70 - 99 (mg/dL)    Comment 1 Notify RN     GLUCOSE, CAPILLARY     Status: Abnormal   Collection Time   02/07/11  3:59 PM      Component Value Range Comment   Glucose-Capillary 139 (*) 70 - 99 (mg/dL)    Comment 1 Notify RN     GLUCOSE, CAPILLARY     Status: Abnormal   Collection Time   02/07/11  9:30 PM      Component Value Range Comment   Glucose-Capillary 175 (*) 70 - 99 (mg/dL)    Comment 1 Notify RN      Physical Exam  Constitutional: He is oriented to person, place, and time. Appears fatigued HENT:    Head: Normocephalic.  Neck:  Collar in place  Cardiovascular: Normal rate.  Pulmonary/Chest: Breath sounds normal. He has no wheezes.  Abdominal: He exhibits no distension. There is no tenderness. Bowel sounds are active. Musculoskeletal: He exhibits no edema.  Neurological: He is alert and oriented to person, place, and time. A sensory deficit is present. Coordination reduced in BUEs Patient with 4/5 strength in R upper extremity, 3/5 on Left. HI are improving and now 3-3+/5. Marland Kitchen Uses tenodesis grip to improve functional grasp. He had some diminishment of his fine touch in the hands right more than left. In the lower extremities strength was tr/5 proximally at the right hip flexors 1-tr at the right knee extensors and flexors and to tr-1/5 right at the ankle dorsiflexors and plantar flexors. Left leg 1-1+/5 generally (left is a little stronger than right leg). He had some diminishment of his leg sensation at 1 plus out of 2. Fine motor coordination of the upper extremities was fair. Cognitively he was alert and oriented x 3 with extra time, but was slow to process . CN exam unremarkable 2-12. DTR's 2+ in lower ext's and resting tone at knees and ankles of trace to 1+ /4.  (Updated 12/26) Skin: Surgical site clean. C-collar fitting appropriately. Erythema and mild breakdown at sacrum with EPBC in place Psychiatric: pleasant, but flat    Assessment/Plan: 1. Functional deficits secondary to Cervical myelopathy causing tetraplegia C7 ASIA C which require 3+ hours per day of interdisciplinary therapy in a comprehensive inpatient rehab setting. Physiatrist is providing close team supervision and 24 hour management of active medical problems listed below. Physiatrist and rehab team continue to assess barriers to discharge/monitor patient progress toward functional and medical goals.  Tried to encourage patient regarding his progress and path ahead.  Mobility: Bed Mobility Bed Mobility: Yes Rolling  Right:  2: Max assist Rolling Right Details (indicate cue type and reason): Cues for UE use, A for bil LEs Rolling Left: 2: Max assist Rolling Left Details (indicate cue type and reason): Cues for UE use, A for bil LEs Left Sidelying to Sit: 1: +1 Total assist;HOB flat Left Sidelying to Sit Details (indicate cue type and reason): Pt able to A with bil UEs, but A needed for trunk and LEs Sitting - Scoot to Edge of Bed: 2: Max assist Sitting - Scoot to [place] of Bed Details (indicate cue type and reason): Cues for hand placement and timing, A for anterior translation at hips Sit to Supine - Right: 1: +2 Total assist;HOB flat Scooting to HOB: 1: +2 Total assist Scooting to [facility] Details (indicate cue type and reason): A to position LEs in hooklying and cues to lift hips. A for scooting Transfers Transfers: Yes Sit to Stand: 1: +2 Total assist;With upper extremity assist;From bed Sit to Stand Details (indicate cue type and reason): A needed for lifting and lowering. Pt unable to clear hps from bed without +2 A. Tried Sara Plus, but increased thoracic pain, so stopped.  Stand to Sit: 1: +2 Total assist;With upper extremity assist;To bed Ambulation/Gait Ambulation/Gait Assistance: Not tested (comment) Stairs: No Wheelchair Mobility Wheelchair Mobility: No Distance: 60  ADL:    Cognition: Cognition Overall Cognitive Status: Appears within functional limits for tasks assessed Arousal/Alertness: Awake/alert Orientation Level: Oriented X4 Attention: Sustained Sustained Attention: Appears intact Memory: Appears intact Awareness: Appears intact Problem Solving: Appears intact Safety/Judgment: Appears intact Cognition Arousal/Alertness: Awake/alert Orientation Level: Oriented X4      Medical problem list  1.Cervical Myelopathy-S/Pcervical diskectomy with decompression 12/3.Cervical collar at all times.Decadron taper--need to decide on schedule.  2. DVT Prophylaxis/Anticoagulation:  SCD/support hose.No current signs of DVT. Dopplers are negative.   3. Pain Management: Oxycodone/robaxin.Monitor with increased activity.    4. Neurogenic bladder and hematuria/BPH-.Foley tube in place with hematuria resolving.Continue flomax for now.Empiric keflex 12/7 and plan follow up urine study.Will discuss voiding trial with urology services. Needs to keep foley in regardless to protect skin also  5. Neurogenic bowel/hypogastric discomfort: -am bowel  Program  -fibercon and dulcolax  -increased florastor  -explained to patient that some of pain is likely neurologic/sensory loss.  -his abdomen is soft  -urinalysis is positive with yeast- begin diflucan.  Await culture  -is on gi prophylaxis  -decadron decreased to TID    5.Steriod induced hyperglycemia-check blood sugars ac/hs and monitor as taper steroids. Cover with SSI and rx UTI.  Continue to monitor only.  Should resolve with d/c of steroids  6.Depression-celexa daily.Provide emotional support.   7.GERD-protonix   8.Hyperlipidemia-zocor  12  Hy Swiatek T 02/08/2011, 7:02 AM

## 2011-02-08 NOTE — Progress Notes (Signed)
Physical Therapy Weekly Progress Note  Patient Details  Name: Ricky Bradley MRN: [id number] Date of Birth: 01-Jan-1934  Today's Date: 02/08/2011 Time: 1000-1100 Time Calculation (min): 60 min  11:00- 12:00 individual therapy pt denied pain.  wc mobility controlled environment supervision 120' slow speed but improved shoulder use, sliding board transfers max assist with pt counting vc needed for hand placement and weight shift. Performed elbow leans x 3 each way with mod assist for trunk strenthening, sit to supine max assist and supine to sit 2 person assist . Performed PROM and stretches all planes in Bil. LEs 15 each..  Patient has met 3 of 3 short term goals.  Pt. Initially required 2 person assist for bed mobility and sliding board transfers and was unable to propel a wc. Currently pt can perform bed mobility with max assist with rail, sliding board transfers with max assist to lateral surfaces, and wc mobility controlled environment min assist to occasional supervision 120'.  Patient continues to demonstrate the following deficits: decreased postural control, decreased activity tolerance, generalized muscle weakness, abnormal tone and therefore will continue to benefit from skilled PT intervention to enhance overall performance with postural control and functional mobility  Patient progressing toward long term goals..  Continue plan of care.  PT Short Term Goals PT Short Term Goal 1: Perform all bed mobility Max A  PT Short Term Goal 1 - Progress: Met PT Short Term Goal 2: Perform all functional transfers Max A  PT Short Term Goal 2 - Progress: Met PT Short Term Goal 3: Maintain static sitting balance with Min PT Short Term Goal 3 - Progress: Met PT Short Term Goal 4: Maintain static standing balance Mod A PT Short Term Goal 4 - Progress: Goal discharged PT Short Term Goal 5: WC mobility Mod A x 50' PT Short Term Goal 5 - Progress met  Therapy Documentation Precautions:  Precautions Precautions: Fall Precaution Comments: cervical precautions Required Braces or Orthoses: Yes Cervical Brace: Hard collar;Applied in supine position Restrictions Weight Bearing Restrictions: No     Pain Pain Assessment Pain Assessment: 0-10 Pain Score: 0-No pain     Therapy/Group: Individual Therapy  [name] 02/08/2011, 1:02 PM

## 2011-02-08 NOTE — Progress Notes (Signed)
Physical Therapy Note  Patient Details  Name: Ricky Bradley MRN: [id number] Date of Birth: 07/28/1933 Today's Date: 02/08/2011  14:30- 15:15;' Individual therapy pt denied pain.  Standing frame 3 x 30 seconds for LE weight bearing and BP tolerance. Performed UE chest extension for strength and ROM.  BP sitting 138/88 and decreased to 81/61 with standing pt with complaints of dizziness. Sliding board transfers x3 with max assist to total assist last one due to fatigue. Sit to supine max assist. Dynamic sitting balance min assist to max assist with LOB with 1 UE support reaching across midline and up. vc given to use head position to control balance.  [name] 02/08/2011, 4:33 PM

## 2011-02-08 NOTE — Progress Notes (Signed)
Occupational Therapy Session Note  Patient Details  Name: [name] MRN: [id number] Date of Birth: 08/11/1933  Today's Date: 02/08/2011 Time:  -  1st session 10-11 ( ) 2nd session 11-12 ( )  Precautions: Precautions Precautions: Fall Precaution Comments: cervical precautions Required Braces or Orthoses: Yes Cervical Brace: Hard collar;Applied in supine position Restrictions Weight Bearing Restrictions: No  Short Term Goals: OT Short Term Goal 1: Pt. will be minimal assist with feeding self OT Short Term Goal 1 - Progress: Met OT Short Term Goal 2: Pt. will be be set up with UB bathing OT Short Term Goal 2 - Progress: Met OT Short Term Goal 3: Pt.  will verbalize strategies for pressur relief with minimal cues OT Short Term Goal 3 - Progress: Met OT Short Term Goal 4: Pt.  will sit up for 2 hours daily in chair OT Short Term Goal 4 - Progress: Met  Skilled Therapeutic Interventions/Progress Updates:  1st session: self care retraining : LB in bed with HOB elevated focusing on trying to get into a modified half circle sit- trying to gain more flexibility to increase his ability to perform more LB bathing and dressing; rolling right and left with increased labored breathing with dizziness rolling to pt's left, supine to sit total A and max A to sit EOB secondary to dizziness, total A +2 for safety to get from bed to chair using long slide board; UB bathing and dressing performed at sink focusing on leaning forward and being able to maintain a forward posture for functional tasks   2nd session: focus on bed mobility to change soiled brief, min A to roll right and left  (A for LEs) and verbal cues for hip movement- no c/o dizziness with rolling this pm, supine to sit EOB with max A, sitting balance at EOB with bilateral UE support with close support as OT preps w/c for transfer, total A of one person to transfer to w/c with long slide board with a second person just holding the  w/c. In w/c pt with increase muscle activation and active movement in left leg, stretched hip flexors crossing leg to focus on flexibility.  Pain c/o of abdominal pain in both sessions- RN aware and provided meds for both session.    Therapy/Group: Individual Therapy  Melonie Florida 02/08/2011, 3:18 PM

## 2011-02-08 NOTE — Progress Notes (Signed)
Pill crushed in applesauce. Aspen collar worn at all times. 2 person assist with slide board transfers. Foley catheter to gravity drainage. Pt on bowel program. Buttocks reddened and using Microguard powder. Requires set up but can feed self utilizing adaptive utensils. Medicated x1 for abdominal pain with Oxy IR 5 mg with good results. No other changes noted at this time, Continue POC

## 2011-02-09 LAB — URINE CULTURE

## 2011-02-09 LAB — GLUCOSE, CAPILLARY: Glucose-Capillary: 110 mg/dL — ABNORMAL HIGH (ref 70–99)

## 2011-02-09 MED ORDER — DEXTROSE 5 % IV SOLN
1.0000 g | Freq: Two times a day (BID) | INTRAVENOUS | Status: AC
  Administered 2011-02-09 – 2011-02-15 (×14): 1 g via INTRAVENOUS
  Filled 2011-02-09 (×14): qty 1

## 2011-02-09 MED ORDER — CIPROFLOXACIN HCL 250 MG PO TABS
250.0000 mg | ORAL_TABLET | Freq: Two times a day (BID) | ORAL | Status: DC
  Administered 2011-02-09: 250 mg via ORAL
  Filled 2011-02-09 (×4): qty 1

## 2011-02-09 MED ORDER — DEXAMETHASONE 0.5 MG PO TABS
1.0000 mg | ORAL_TABLET | Freq: Two times a day (BID) | ORAL | Status: DC
  Administered 2011-02-09 – 2011-02-15 (×13): 1 mg via ORAL
  Filled 2011-02-09 (×16): qty 2

## 2011-02-09 NOTE — Progress Notes (Signed)
Physical Therapy Session Note  Patient Details  Name: Ricky Bradley MRN: [id number] Date of Birth: 1933-04-16  Today's Date: 02/09/2011 Time: 7829-5621 Time Calculation (min): 45 min  Precautions: Precautions Precautions: Fall Precaution Comments: Cervical precautions Required Braces or Orthoses: Yes Cervical Brace: Hard collar;Applied in supine position Restrictions Weight Bearing Restrictions: No  Short Term Goals: PT Short Term Goal 1: Perform all bed mobility Max A  PT Short Term Goal 1 - Progress: Met PT Short Term Goal 2: Perform all functional transfers Max A  PT Short Term Goal 2 - Progress: Met PT Short Term Goal 3: Maintain static sitting balance with Min PT Short Term Goal 3 - Progress: Met PT Short Term Goal 4: Maintain static standing balance Mod A PT Short Term Goal 4 - Progress: Progressing toward goal PT Short Term Goal 5: WC mobility Mod A x 50' PT Short Term Goal 5 - Progress: Progressing toward goal  Skilled Therapeutic Interventions/Progress Updates:     General Chart Reviewed: Yes Family/Caregiver Present: No   Pain Pain Assessment Pain Assessment: No/denies pain Pain Score: 0-No pain  Other Treatments  Patient had just been returned to bed prior to PT entry. Patient declined OOB due to fatigue, but agreeable to supine UE and LE therex. Supine bilat LE stretches to maintain ROM, emphasis on heel cords right > left, to prevent contractures. Bilat quad sets and glute sets 3 x 5 each. Left ankle pumps and circles 3 x 5 reps and left ankle AAROM ankle pumps 3 x 5 reps for increased functional strength. Bilat UE shoulder horizontal abduction, horizontal adduction, punches, biceps curls, and shoulder press with pale orange theraband 2 x 10 reps each for increased functional strength. During session, patient reports difficulty swallowing and feeling concerned about this. No outward signs or symptoms of distress. RN aware. SLP eval order already written in  chart.  Therapy/Group: Individual Therapy  [name] 02/09/2011, 4:09 PM

## 2011-02-09 NOTE — Progress Notes (Signed)
Pt is alert, Pt has small soft BM after morning suppository . Pt complained of difficulty swallowing pills today and had no trouble swallowing food. Pills crushed with applesauce pt swallowed with out difficulty. Pt transferred from wheelchair to bed with a mod assist of 3.  Pt requires  A set up for meal trays Ricky Bradley

## 2011-02-09 NOTE — Progress Notes (Signed)
Nursing Note : Pt having more difficulty swallowing meds.Dr.Swartz made aware.wbb

## 2011-02-09 NOTE — Patient Care Conference (Signed)
Inpatient RehabilitationTeam Conference Note Date: 02/09/2011   Time: 5:49 PM    Patient Name: Ricky Bradley      Medical Record Number: [id number]  Date of Birth: 02/26/1933 Sex: Male         Room/Bed: 4029/4029-01 Payor Info: Payor: MEDICARE  Plan: MEDICARE PART A AND B  Product Type: *No Product type*     Admitting Diagnosis: C6 C7 STENOSIS WITH MYELOPATHY  Admit Date/Time:  01/27/2011  3:38 PM Admission Comments: No comment available   Primary Diagnosis:  Myelopathy Principal Problem: Myelopathy  Patient Active Problem List  Diagnoses Date Noted  . Myelopathy 01/27/2011  . Gross hematuria 01/16/2011  . Cord compression 01/10/2011  . Irritable bowel syndrome (IBS) 01/10/2011  . HTN (hypertension) 01/09/2011  . Dyslipidemia 01/09/2011  . BPH (benign prostatic hyperplasia) 01/09/2011  . GERD (gastroesophageal reflux disease) 01/09/2011  . Lumbar stenosis with neurogenic claudication 01/09/2011    Expected Discharge Date: Expected Discharge Date: 02/24/11 ( ?Possible SNF d/c)  Team Members Present: Physician: Dr. Faith Rogue Case Manager Present: Melanee Spry, RN Social Worker Present: Amada Jupiter, LCSW PT Present: Karolee Stamps, PT OT Present: Mackie Pai, OT;Ardis Rowan, COTA RN Present: Rosalio Macadamia    Current Status/Progress Goal Weekly Team Focus  Medical   slow neuro gains.  complains of persistent abdominal pain which i think is neurologic and also may be related to UTI  improved activity tolerance, decreased pain  education and mgt of above issues   Bowel/Bladder   foley catheter, bowel program-daily suppostiory  foley at d/c bowel program successful  teach pt and family bowel program   Swallow/Nutrition/ Hydration             ADL's   Bathing Mod A. UB D mod A. LB D total A. Max A supine - sit.   Overall min assist/supervision  ADL retraining. sitting balance. sitting tolerance/endurance.   Mobility   max assist sliding board min assist wc mobility  mod assist  transfer supervision wc  activity tolerance and trunk strength   Communication             Safety/Cognition/ Behavioral Observations            Pain   abdominal pain  pain free  assess effectiveness of PRN medications, need for daily laxative   Skin   reddened burrocks  no break in skin intrgrity  use skin cream and Microguard powder      *See Interdisciplinary Assessment and Plan and progress notes for long and short-term goals  Barriers to Discharge: profound neurological deficits    Possible Resolutions to Barriers:  family, patient, and caregiver ed    Discharge Planning/Teaching Needs:  likely to change d/c plan to SNF - meeting with daughter today to review team conference and discuss d/c plan further      Team Discussion: ST order to check swallow.  Continue to work on bowel program--still occasional oozing of soft stool outside of schedule.  Pt has UTI.   Revisions to Treatment Plan: none    Continued Need for Acute Rehabilitation Level of Care: The patient requires daily medical management by a physician with specialized training in physical medicine and rehabilitation for the following conditions: Daily direction of a multidisciplinary physical rehabilitation program to ensure safe treatment while eliciting the highest outcome that is of practical value to the patient.: Yes Daily medical management of patient stability for increased activity during participation in an intensive rehabilitation regime.: Yes Daily analysis of laboratory  values and/or radiology reports with any subsequent need for medication adjustment of medical intervention for : Post surgical problems;Neurological problems  Brock Ra 02/09/2011, 5:49 PM

## 2011-02-09 NOTE — Progress Notes (Signed)
OccupationalTherapy Note  Patient Details  Name: Ricky Bradley MRN: [id number] Date of Birth: 1933/12/17 Today's Date: 02/09/2011  Time:1430 - 1500 30 min Pain - none  Skilled Intervention: BUE strengthening using theraband. Tied to bed to complete independently in room. Bed mobility using bed rails to roll. A to roll hips only. Total A to change diaper. Also worked on sitting balance EOB. Able to sit with S with BUE support. Participated in reaching activities in sitting with Min A for balance. Good participation.  Individual session.  Kieara Schwark,Ricky Bradley 02/09/2011, 3:18 PM

## 2011-02-09 NOTE — Progress Notes (Addendum)
Patient ID: Ricky Bradley, male   DOB: [birth year], 77 y.o.   MRN: [id number] Subjective/Complaints: Abdominal discomfort a little better.  Slept better last night.  RN reports increased difficulty swallowing.  Review of Systems  HENT: Positive for neck pain.   Gastrointestinal:       Still feels a little bloated.  Appetite reasonable though  Musculoskeletal: Positive for back pain.  All other systems reviewed and are negative.   Objective: Vital Signs: Blood pressure 113/67, pulse 88, temperature 97.6 F (36.4 C), temperature source Oral, resp. rate 18, weight 75.3 kg (166 lb 0.1 oz), SpO2 98.00%. No results found. Results for orders placed during the hospital encounter of 01/27/11 (from the past 72 hour(s))  GLUCOSE, CAPILLARY     Status: Abnormal   Collection Time   02/06/11  7:21 AM      Component Value Range Comment   Glucose-Capillary 146 (*) 70 - 99 (mg/dL)    Comment 1 Notify RN     GLUCOSE, CAPILLARY     Status: Abnormal   Collection Time   02/06/11 11:41 AM      Component Value Range Comment   Glucose-Capillary 120 (*) 70 - 99 (mg/dL)    Comment 1 Notify RN     URINALYSIS, ROUTINE W REFLEX MICROSCOPIC     Status: Abnormal   Collection Time   02/06/11  3:00 PM      Component Value Range Comment   Color, Urine YELLOW  YELLOW     APPearance CLOUDY (*) CLEAR     Specific Gravity, Urine 1.018  1.005 - 1.030     pH 6.0  5.0 - 8.0     Glucose, UA NEGATIVE  NEGATIVE (mg/dL)    Hgb urine dipstick MODERATE (*) NEGATIVE     Bilirubin Urine NEGATIVE  NEGATIVE     Ketones, ur NEGATIVE  NEGATIVE (mg/dL)    Protein, ur NEGATIVE  NEGATIVE (mg/dL)    Urobilinogen, UA 1.0  0.0 - 1.0 (mg/dL)    Nitrite POSITIVE (*) NEGATIVE     Leukocytes, UA LARGE (*) NEGATIVE    URINE CULTURE     Status: Normal (Preliminary result)   Collection Time   02/06/11  3:00 PM      Component Value Range Comment   Specimen Description URINE, CATHETERIZED      Special Requests NONE      Setup Time  [id number]      Colony Count >=100,000 COLONIES/ML      Culture GRAM NEGATIVE RODS      Report Status PENDING     URINE MICROSCOPIC-ADD ON     Status: Abnormal   Collection Time   02/06/11  3:00 PM      Component Value Range Comment   Squamous Epithelial / LPF RARE  RARE     WBC, UA TOO NUMEROUS TO COUNT  <3 (WBC/hpf)    RBC / HPF 11-20  <3 (RBC/hpf)    Bacteria, UA MANY (*) RARE     Urine-Other MANY YEAST   MUCOUS PRESENT  GLUCOSE, CAPILLARY     Status: Abnormal   Collection Time   02/06/11  6:58 PM      Component Value Range Comment   Glucose-Capillary 220 (*) 70 - 99 (mg/dL)    Comment 1 Notify RN     GLUCOSE, CAPILLARY     Status: Abnormal   Collection Time   02/06/11  9:09 PM      Component Value Range Comment   Glucose-Capillary  233 (*) 70 - 99 (mg/dL)    Comment 1 Notify RN     GLUCOSE, CAPILLARY     Status: Abnormal   Collection Time   02/07/11  8:32 AM      Component Value Range Comment   Glucose-Capillary 252 (*) 70 - 99 (mg/dL)   GLUCOSE, CAPILLARY     Status: Abnormal   Collection Time   02/07/11 11:48 AM      Component Value Range Comment   Glucose-Capillary 117 (*) 70 - 99 (mg/dL)    Comment 1 Notify RN     GLUCOSE, CAPILLARY     Status: Abnormal   Collection Time   02/07/11  3:59 PM      Component Value Range Comment   Glucose-Capillary 139 (*) 70 - 99 (mg/dL)    Comment 1 Notify RN     GLUCOSE, CAPILLARY     Status: Abnormal   Collection Time   02/07/11  9:30 PM      Component Value Range Comment   Glucose-Capillary 175 (*) 70 - 99 (mg/dL)    Comment 1 Notify RN     GLUCOSE, CAPILLARY     Status: Abnormal   Collection Time   02/08/11  7:19 AM      Component Value Range Comment   Glucose-Capillary 112 (*) 70 - 99 (mg/dL)    Comment 1 Notify RN     GLUCOSE, CAPILLARY     Status: Normal   Collection Time   02/08/11 12:09 PM      Component Value Range Comment   Glucose-Capillary 81  70 - 99 (mg/dL)    Comment 1 Notify RN     GLUCOSE, CAPILLARY      Status: Normal   Collection Time   02/08/11  4:36 PM      Component Value Range Comment   Glucose-Capillary 79  70 - 99 (mg/dL)    Comment 1 Notify RN     GLUCOSE, CAPILLARY     Status: Abnormal   Collection Time   02/08/11  8:36 PM      Component Value Range Comment   Glucose-Capillary 209 (*) 70 - 99 (mg/dL)    Comment 1 Notify RN      Physical Exam  Constitutional: He is oriented to person, place, and time. Appears fatigued HENT:  Head: Normocephalic.  Neck:  Collar in place  Cardiovascular: Normal rate.  Pulmonary/Chest: Breath sounds normal. He has no wheezes.  Abdominal: He exhibits no distension. There is no tenderness. Bowel sounds are active. Musculoskeletal: He exhibits no edema.  Neurological: He is alert and oriented to person, place, and time. A sensory deficit is present. Coordination reduced in BUEs Patient with 4/5 strength in R upper extremity, 3/5 on Left. HI are improving and now 3-3+/5. Marland Kitchen In the lower extremities strength was tr/5 proximally at the right hip flexors 1-tr at the right knee extensors and flexors and to tr-1/5 right. Left leg 1-1+/5 generally (left is a little stronger than right leg). Left ADF/APF 1-2/5. R ADF/APF tr-1/5.  continued leg sensation at 1 plus out of 2. Fine motor coordination of the upper extremities was fair. Cognitively he was alert and oriented x 3 with extra time, but was slow to process . CN exam unremarkable 2-12. DTR's 2+ in lower ext's and resting tone at knees and ankles of trace to 1+ /4.  (Updated 12/26) Skin: Surgical site clean. C-collar fitting appropriately. Erythema and mild breakdown at sacrum with EPBC and stable.  Psychiatric: pleasant, but flat    Assessment/Plan: 1. Functional deficits secondary to Cervical myelopathy causing tetraplegia C7 ASIA C which require 3+ hours per day of interdisciplinary therapy in a comprehensive inpatient rehab setting. Physiatrist is providing close team supervision and 24 hour  management of active medical problems listed below. Physiatrist and rehab team continue to assess barriers to discharge/monitor patient progress toward functional and medical goals.    Mobility: Bed Mobility Bed Mobility: Yes Rolling Right: 2: Max assist Rolling Right Details (indicate cue type and reason): Cues for UE use, A for bil LEs Rolling Left: 2: Max assist Rolling Left Details (indicate cue type and reason): Cues for UE use, A for bil LEs Left Sidelying to Sit: 1: +1 Total assist;HOB flat Left Sidelying to Sit Details (indicate cue type and reason): Pt able to A with bil UEs, but A needed for trunk and LEs Sitting - Scoot to Edge of Bed: 2: Max assist Sitting - Scoot to [employer] of Bed Details (indicate cue type and reason): Cues for hand placement and timing, A for anterior translation at hips Sit to Supine - Right: 1: +2 Total assist;HOB flat Scooting to HOB: 1: +2 Total assist Scooting to [facility] Details (indicate cue type and reason): A to position LEs in hooklying and cues to lift hips. A for scooting Transfers Transfers: Yes Sit to Stand: 1: +2 Total assist;With upper extremity assist;From bed Sit to Stand Details (indicate cue type and reason): A needed for lifting and lowering. Pt unable to clear hps from bed without +2 A. Tried Sara Plus, but increased thoracic pain, so stopped.  Stand to Sit: 1: +2 Total assist;With upper extremity assist;To bed Ambulation/Gait Ambulation/Gait Assistance: Not tested (comment) Stairs: No Wheelchair Mobility Wheelchair Mobility: No Distance: 60  ADL:    Cognition: Cognition Overall Cognitive Status: Appears within functional limits for tasks assessed Arousal/Alertness: Awake/alert Orientation Level: Oriented to person;Oriented to place;Oriented to time;Oriented to situation Attention: Sustained Sustained Attention: Appears intact Memory: Appears intact Awareness: Appears intact Problem Solving: Appears intact Safety/Judgment:  Appears intact Cognition Arousal/Alertness: Awake/alert Orientation Level: Oriented to person;Oriented to place;Oriented to time;Oriented to situation      Medical problem list  1.Cervical Myelopathy-S/Pcervical diskectomy with decompression 12/3.Cervical collar at all times.Decadron taper--need to decide on schedule.  2. DVT Prophylaxis/Anticoagulation: SCD/support hose.No current signs of DVT. Dopplers are negative.   3. Pain Management: Oxycodone/robaxin.Monitor with increased activity.    4. Neurogenic bladder and hematuria/BPH-.Foley tube in place with hematuria resolving.Continue flomax for now.Empiric keflex 12/7 and plan follow up urine study.Will discuss voiding trial with urology services. Needs to keep foley in regardless to protect skin also  5. Neurogenic bowel/hypogastric discomfort: -am bowel  Program  -fibercon and dulcolax  -increased florastor  -explained to patient that some of pain is likely neurologic/sensory loss.  -his abdomen is soft  -urinalysis is positive with yeast- begin diflucan.  Culture showing GNR--start cipro  -is on gi prophylaxis  -decadron decreased to BID today.    5.Steriod induced hyperglycemia-check blood sugars ac/hs and monitor as taper steroids. Cover with SSI and rx UTI.  Continue to monitor only.  Should resolve with d/c of steroids  6.Depression-celexa daily.Provide emotional support.   7.Dysphagia-  Will ask SLP to assess.  8.Hyperlipidemia-zocor  13  Heylee Tant T 02/09/2011, 6:52 AM

## 2011-02-09 NOTE — Progress Notes (Signed)
Physical Therapy Note  Patient Details  Name: Ricky Bradley MRN: [id number] Date of Birth: August 11, 1933 Today's Date: 02/09/2011  1100-1155 (55 minutes) individual treatment Pain- 5/10 abdomen- premedicated  Blood Pressure (sitting) 127/73 pulse 103/ 116/78  Pulse 109 (sitting) Focus of treatment: bilateral UE strengthening to improve transfers; standing using standing frame to improve blood pressure tolerance and trunk control Treatment: Wc mobility -120 feet X 2 SBA with one rest break; Standing X 2 for approximately 2-3 minutes before C/o dizziness . Unable to obtain standing blood pressure reading X 2 UE strengthening: Seated bilateral shoulder depression, scapular adduction, elbow extension, bicep flexion X 15 using yellow theraband Seated trunk rhythmic stabilization exercises X 5   Andrya Roppolo,JIM 02/09/2011, 12:42 PM

## 2011-02-09 NOTE — Progress Notes (Signed)
Occupational Therapy Session Note  Patient Details  Name: Ricky Bradley MRN: [id number] Date of Birth: 1933/03/23  Today's Date: 02/09/2011 Time: 1000-1100 Time Calculation (min): 60 min  Precautions: Precautions Precautions: Fall Precaution Comments: cervical precautions Required Braces or Orthoses: Yes Cervical Brace: Hard collar;Applied in supine position Restrictions Weight Bearing Restrictions: No   Skilled Interventions/Progress Updates:    ADL retraining including LB bathing and dressing supine with HOB elevated and UB bathing and dressing seated in w/c at sink.  Pt able to bathe front perineal area and upper legs with HOB elevated at approx 45 degrees.  Pt rolls side to side using bed rails with assistance positioning BLE.  No c/o of dizziness with rolling.  Supine to sit with HOB elevated at max assist and min verbal cues for correct UE placement and trunk positioning.  Pt c/o increased dizziness with sitting EOB but decreased over approx 2 mins.  Pt able to lean on to elbow for sliding board positioning with mod A.  Sliding board transfer with tot A to w/c to complete bathing and dressing.  Focus in w/c at leaning forward to gather supplies for bathing and maintaining unsupported sitting in w/c to don shirt.  Pt requires mod A for sitting unsupported in w/c.  Focus on activity tolerance, BUE use for BADLs, sitting balance, and safety awareness.    Pain Pain Assessment Pain Assessment: 0-10 Pain Score:   3 Pain Type: Acute pain Pain Location: Back Pain Orientation: Lower Pain Descriptors: Aching Pain Onset: Gradual Patients Stated Pain Goal: 0 Pain Intervention(s): Repositioned  Therapy/Group: Individual Therapy  [name] 02/09/2011, 12:02 PM

## 2011-02-10 DIAGNOSIS — G8250 Quadriplegia, unspecified: Secondary | ICD-10-CM

## 2011-02-10 DIAGNOSIS — K592 Neurogenic bowel, not elsewhere classified: Secondary | ICD-10-CM

## 2011-02-10 DIAGNOSIS — N319 Neuromuscular dysfunction of bladder, unspecified: Secondary | ICD-10-CM

## 2011-02-10 DIAGNOSIS — M4712 Other spondylosis with myelopathy, cervical region: Secondary | ICD-10-CM

## 2011-02-10 DIAGNOSIS — Z5189 Encounter for other specified aftercare: Secondary | ICD-10-CM

## 2011-02-10 LAB — CBC
HCT: 37.2 % — ABNORMAL LOW (ref 39.0–52.0)
Hemoglobin: 13.2 g/dL (ref 13.0–17.0)
MCH: 29.8 pg (ref 26.0–34.0)
MCHC: 35.5 g/dL (ref 30.0–36.0)
MCV: 84 fL (ref 78.0–100.0)

## 2011-02-10 LAB — BASIC METABOLIC PANEL
BUN: 18 mg/dL (ref 6–23)
Calcium: 8.4 mg/dL (ref 8.4–10.5)
GFR calc non Af Amer: >90 mL/min (ref >90)
Glucose, Bld: 106 mg/dL — ABNORMAL HIGH (ref 70–99)
Potassium: 3.4 mEq/L — ABNORMAL LOW (ref 3.5–5.1)

## 2011-02-10 LAB — GLUCOSE, CAPILLARY
Glucose-Capillary: 106 mg/dL — ABNORMAL HIGH (ref 70–99)
Glucose-Capillary: 122 mg/dL — ABNORMAL HIGH (ref 70–99)

## 2011-02-10 NOTE — Progress Notes (Signed)
Occupational Therapy Session Note  Patient Details  Name: Ricky Bradley MRN: [id number] Date of Birth: Feb 07, 1934  Today's Date: 02/10/2011 Time: 1000-1100 Time Calculation (min): 60 min  Precautions: Precautions Precautions: Fall Precaution Comments: Cervical precautions Required Braces or Orthoses: Yes Cervical Brace: Hard collar;Applied in supine position Restrictions Weight Bearing Restrictions: No   Skilled Therapeutic Interventions/Progress Updates:    ADL retraining with focus on bed mobility, sitting balance EOB, transfers, and UB bathing and dressing seated in w/c.  Pt c/o some dizziness when initially sitting EOB but resolved within approx 1 minute.  Pt continues to require BUE support when sitting EOB.  Pt completed UB bathing tasks with setup seated in w/c at sink.  Pt continues to require assistance with buttons on shirt.  Pt completed all grooming tasks including opening containers with no assistance.  Pt exhibiting increased trunk control and repositioning in w/c.     Pain Pain Assessment Pain Assessment: No/denies pain ADL     Therapy/Group: Individual Therapy  [name] 02/10/2011, 12:46 PM

## 2011-02-10 NOTE — Progress Notes (Signed)
Occupational Therapy Session Note  Patient Details  Name: Ricky Bradley MRN: [id number] Date of Birth: [birth year]  Today's Date: 02/10/2011 Time: 1400-1430 Time Calculation (min): 30 min  Precautions: Precautions Precautions: Fall Precaution Comments: Cervical precautions Required Braces or Orthoses: Yes Cervical Brace: Hard collar;Applied in supine position Restrictions Weight Bearing Restrictions: No  Short Term Goals: OT Short Term Goal 1: Pt. will be minimal assist with feeding self OT Short Term Goal 1 - Progress: Met OT Short Term Goal 2: Pt. will be be set up with UB bathing OT Short Term Goal 2 - Progress: Met OT Short Term Goal 3: Pt.  will verbalize strategies for pressur relief with minimal cues OT Short Term Goal 3 - Progress: Met OT Short Term Goal 4: Pt.  will sit up for 2 hours daily in chair OT Short Term Goal 4 - Progress: Met  Skilled Therapeutic Interventions/Progress Updates:    Pt incontinent of bowel.  Focus on bed mobility rolling side to side with use of bed rails.  Pt total assist for hygiene, changing clothing and linens.  Dtr present but pt requested that dtr not participate in therapy.  Discussed with pt the importance of advocating for self regarding care.  Pt verbalized understanding but continues to demonstrate apprehension in this matter.    Pain Pain Assessment Pain Assessment: No/denies pain  Therapy/Group: Individual Therapy  [name] 02/10/2011, 3:31 PM

## 2011-02-10 NOTE — Progress Notes (Signed)
Per State Regulation 482.30 This chart was reviewed for medical necessity with respect to the patient's Admission/Duration of stay. Yesterday after conference & today SW, Amada Jupiter & this RNCM met w/ daughter to discuss pt's progress & assist  her d/c planning.  Daughter is evaluating local SNFs before finalizing d/c plans.  She is not ready yet to discuss d/c alternatives w/ pt.  Pt continues to participate in therapies & he is improving.   Brock Ra                 Nurse Care Manager              Next Review Date: 02/15/11

## 2011-02-10 NOTE — Progress Notes (Signed)
Speech Language Pathology Therapy Note and Bedside Swallow Evaluation  Patient Details  Name: Ricky Bradley MRN: [id number] Date of Birth: March 30, 1933  Today's Date: 02/10/2011 Time: 4098-1191 Time Calculation (min): 35 min  Precautions: Precautions Precautions: Fall Precaution Comments: Cervical precautions Required Braces or Orthoses: Yes Cervical Brace: Hard collar;Applied in supine position Restrictions Weight Bearing Restrictions: No  Skilled Therapeutic Interventions: Administered BSE, please see below for details.   Pain Pain Assessment Pain Assessment: 0-10 Pain Score:   4 Pain Type: Acute pain Pain Location: Neck Pain Orientation: Posterior Pain Descriptors: Aching Pain Intervention(s): Medication (See eMAR)  Oral/Motor: Oral Motor/Sensory Function Overall Oral Motor/Sensory Function: Appears within functional limits for tasks assessed  Therapy/Group: Individual Therapy  Clinical/Bedside Swallow Evaluation Patient Details  Name: Ricky Bradley MRN: [id number] DOB: 08-24-1933 Today's Date: 02/10/2011  Past Medical History:  Past Medical History  Diagnosis Date  . Hypertension   . GERD (gastroesophageal reflux disease)   . High cholesterol   . Enlarged prostate   . High cholesterol    Past Surgical History:  Past Surgical History  Procedure Date  . Joint replacement 2001  and 2002    both knees  . Laminectomy 08/2010  . Back surgery   . Replacement total knee   . Cholecystectomy 11/01/10  . Anterior cervical decomp/discectomy fusion 01/16/2011    Procedure: ANTERIOR CERVICAL DECOMPRESSION/DISCECTOMY FUSION 1 LEVEL;  Surgeon: Cristi Loron;  Location: [facility];  Service: Neurosurgery;  Laterality: N/A;  Cervical six-seven  Anterior Cervical Decomp[ression Fusion  . Anterior cervical decomp/discectomy fusion 01/23/2011    Procedure: ANTERIOR CERVICAL DECOMPRESSION/DISCECTOMY FUSION 1 LEVEL/HARDWARE REMOVAL;  Surgeon: Cristi Loron;  Location: [facility];  Service: Neurosurgery;  Laterality: N/A;  Evacuation of Cervical Six-Seven Hematoma   HPI:      Assessment/Recommendations/Treatment Plan Suspected Esophageal Findings Suspected Esophageal Findings: Belching (pt reports premorbid)  SLP Assessment Clinical Impression Statement: Pt administered BSE and consumed regular textures, thin liquids via straw and pills whole both with liquid and puree. Pt without overt s/s of aspiration but did have consistent belching which he reports is premorbid. Pt also reported that whole medications were going down smoothly and is much improved since the past two days. Pt educated on  compensatory strategies  of slow pace, alternating solids and liquids and  sitting upright after meals as long as possible. Pt recommended to continue with current diet of regular textures and thin liquids with use of compensatory strategies. Skilled SLP services  recommended to monitor tolerance of current diet and utilization of compensatory strategies.  Risk for Aspiration: Mild Other Related Risk Factors: History of GERD;Lethargy  Recommendations Solid Consistency: Regular Liquid Consistency: Thin Liquid Administration via: Straw;Cup Medication Administration: Whole meds with liquid (or puree, depending on how pt feels) Compensations: Slow rate;Small sips/bites;Follow solids with liquid Postural Changes and/or Swallow Maneuvers: Seated upright 90 degrees;Upright 30-60 min after meal  Treatment Plan Treatment Plan Recommendations: Therapy as outlined in treatment plan below Speech Therapy Frequency: min 5x/week Treatment Duration: 1 week Interventions: Aspiration precaution training;Compensatory techniques;Patient/family education;Diet toleration management by SLP  Prognosis Prognosis for Safe Diet Advancement: Good  Individuals Consulted Consulted and Agree with Results and Recommendations: Patient  Swallowing Goals  SLP Swallowing Goals Patient will  consume recommended diet without observed clinical signs of aspiration with: Modified independent assistance Patient will utilize recommended strategies during swallow to increase swallowing safety with: Modified independent assistance   Oral Motor/Sensory Function  Overall Oral Motor/Sensory Function: Appears within functional  limits for tasks assessed  Consistency Results  Ice Chips Ice chips: Not tested  Thin Liquid Thin Liquid: Within functional limits Presentation: Straw Other Comments: Pt with consistent belching after each sip of thin and reports that is premorbid  Nectar Thick Liquid Nectar Thick Liquid: Not tested  Honey Thick Liquid Honey Thick Liquid: Not tested  Puree Puree: Within functional limits  Solid Solid: Within functional limits   Emily Forse 02/10/2011,8:56 AM      Alpha Chouinard 02/10/2011 8:55 AM

## 2011-02-10 NOTE — Progress Notes (Signed)
Speech Language Pathology Therapy Note  Patient Details  Name: Ricky Bradley MRN: [id number] Date of Birth: 06/14/1933  Today's Date: 02/10/2011 Time: 1250-1300 Time Calculation (min): 10 min  Precautions: Precautions Precautions: Fall Precaution Comments: Cervical precautions Required Braces or Orthoses: Yes Cervical Brace: Hard collar;Applied in supine position Restrictions Weight Bearing Restrictions: No  Skilled Therapeutic Interventions: Treatment focus on education with pt's daughter in regards to current swallow function and strategies to utilize during meals to increase swallow safety with current diet. Pt's daughter verbalized understanding.   Pain Pain Assessment Pain Assessment: 0-10 Pain Score:   6 Pain Type: Acute pain Pain Location: Abdomen Pain Descriptors: Aching;Discomfort Pain Intervention(s): Medication (See eMAR)   Therapy/Group: Individual Therapy  [name] 02/10/2011 3:06 PM

## 2011-02-10 NOTE — Progress Notes (Signed)
Physical Therapy Note  Patient Details  Name: Ricky Bradley MRN: [id number] Date of Birth: Jan 12, 1934 Today's Date: 02/10/2011  13:00- 14:00:  Individual therapy, pt denied pain just stated fatigued and numb.  Sliding board  Transfers total assist today with vc needed for hand placement and trunk extension for forward lean. Pt was started on antibiotics for UTI and required more assistance today than wed.  Initiated leg loops for supine to sit with total assist and circle sitting. Pt required total assist to transition to circle sitting and to move legs for functional task of removing sock . Circle sitting also [erformed to increase ROM in hamstrings and adductors.    [name] 02/10/2011, 2:57 PM

## 2011-02-10 NOTE — Progress Notes (Signed)
Pt is alert,  Pt incontinent of a medium bowel movement after suppository.  Pain controlled with current medication regimen. Pt requires meal set up, Pt swallowing is better today.  Nemiah Commander

## 2011-02-10 NOTE — Progress Notes (Signed)
Patient ID: Ricky Bradley, male   DOB: 04-Apr-1933, 77 y.o.   MRN: [id number] Patient ID: Ricky Bradley, male   DOB: 21-Feb-1933, 77 y.o.   MRN: [id number] Subjective/Complaints: Abdominal discomfort better and  Slept better last night.    Review of Systems  HENT: Positive for neck pain.   Gastrointestinal:       Still feels a little bloated.  Appetite reasonable though  Musculoskeletal: Positive for back pain.  All other systems reviewed and are negative.   Objective: Vital Signs: Blood pressure 100/57, pulse 96, temperature 98.4 F (36.9 C), temperature source Oral, resp. rate 20, weight 75.3 kg (166 lb 0.1 oz), SpO2 97.00%. No results found. Results for orders placed during the hospital encounter of 01/27/11 (from the past 72 hour(s))  GLUCOSE, CAPILLARY     Status: Abnormal   Collection Time   02/07/11  8:32 AM      Component Value Range Comment   Glucose-Capillary 252 (*) 70 - 99 (mg/dL)   GLUCOSE, CAPILLARY     Status: Abnormal   Collection Time   02/07/11 11:48 AM      Component Value Range Comment   Glucose-Capillary 117 (*) 70 - 99 (mg/dL)    Comment 1 Notify RN     GLUCOSE, CAPILLARY     Status: Abnormal   Collection Time   02/07/11  3:59 PM      Component Value Range Comment   Glucose-Capillary 139 (*) 70 - 99 (mg/dL)    Comment 1 Notify RN     GLUCOSE, CAPILLARY     Status: Abnormal   Collection Time   02/07/11  9:30 PM      Component Value Range Comment   Glucose-Capillary 175 (*) 70 - 99 (mg/dL)    Comment 1 Notify RN     GLUCOSE, CAPILLARY     Status: Abnormal   Collection Time   02/08/11  7:19 AM      Component Value Range Comment   Glucose-Capillary 112 (*) 70 - 99 (mg/dL)    Comment 1 Notify RN     GLUCOSE, CAPILLARY     Status: Normal   Collection Time   02/08/11 12:09 PM      Component Value Range Comment   Glucose-Capillary 81  70 - 99 (mg/dL)    Comment 1 Notify RN     GLUCOSE, CAPILLARY     Status: Normal   Collection Time   02/08/11  4:36 PM     Component Value Range Comment   Glucose-Capillary 79  70 - 99 (mg/dL)    Comment 1 Notify RN     GLUCOSE, CAPILLARY     Status: Abnormal   Collection Time   02/08/11  8:36 PM      Component Value Range Comment   Glucose-Capillary 209 (*) 70 - 99 (mg/dL)    Comment 1 Notify RN     GLUCOSE, CAPILLARY     Status: Abnormal   Collection Time   02/09/11  7:26 AM      Component Value Range Comment   Glucose-Capillary 110 (*) 70 - 99 (mg/dL)    Comment 1 Notify RN     GLUCOSE, CAPILLARY     Status: Abnormal   Collection Time   02/09/11 12:04 PM      Component Value Range Comment   Glucose-Capillary 105 (*) 70 - 99 (mg/dL)   GLUCOSE, CAPILLARY     Status: Normal   Collection Time   02/09/11  4:23  PM      Component Value Range Comment   Glucose-Capillary 97  70 - 99 (mg/dL)    Comment 1 Notify RN     GLUCOSE, CAPILLARY     Status: Abnormal   Collection Time   02/09/11  8:39 PM      Component Value Range Comment   Glucose-Capillary 258 (*) 70 - 99 (mg/dL)    Comment 1 Notify RN     CBC     Status: Abnormal   Collection Time   02/10/11  6:50 AM      Component Value Range Comment   WBC 5.5  4.0 - 10.5 (K/uL)    RBC 4.43  4.22 - 5.81 (MIL/uL)    Hemoglobin 13.2  13.0 - 17.0 (g/dL)    HCT 16.1 (*) 09.6 - 52.0 (%)    MCV 84.0  78.0 - 100.0 (fL)    MCH 29.8  26.0 - 34.0 (pg)    MCHC 35.5  30.0 - 36.0 (g/dL)    RDW 04.5 (*) 40.9 - 15.5 (%)    Platelets 124 (*) 150 - 400 (K/uL)   BASIC METABOLIC PANEL     Status: Abnormal   Collection Time   02/10/11  6:50 AM      Component Value Range Comment   Sodium 133 (*) 135 - 145 (mEq/L)    Potassium 3.4 (*) 3.5 - 5.1 (mEq/L)    Chloride 98  96 - 112 (mEq/L)    CO2 24  19 - 32 (mEq/L)    Glucose, Bld 106 (*) 70 - 99 (mg/dL)    BUN 18  6 - 23 (mg/dL)    Creatinine, Ser 8.11  0.50 - 1.35 (mg/dL)    Calcium 8.4  8.4 - 10.5 (mg/dL)    GFR calc non Af Amer >90  >90 (mL/min)    GFR calc Af Amer >90  >90 (mL/min)   GLUCOSE, CAPILLARY      Status: Abnormal   Collection Time   02/10/11  7:16 AM      Component Value Range Comment   Glucose-Capillary 106 (*) 70 - 99 (mg/dL)    Comment 1 Notify RN      Physical Exam  Constitutional: He is oriented to person, place, and time. Appears fatigued HENT:  Head: Normocephalic.  Neck:  Collar in place  Cardiovascular: Normal rate.  Pulmonary/Chest: Breath sounds normal. He has no wheezes.  Abdominal: He exhibits no distension. There is no tenderness. Bowel sounds are active. Musculoskeletal: He exhibits no edema.  Neurological: He is alert and oriented to person, place, and time. A sensory deficit is present. Coordination reduced in BUEs Patient with 4/5 strength in R upper extremity, 3/5 on Left. HI are improving and now 3-3+/5. Marland Kitchen In the lower extremities strength was tr/5 proximally at the right hip flexors 1-tr at the right knee extensors and flexors and to tr-1/5 right. Left leg 1-1+/5 generally (left is a little stronger than right leg). Left ADF/APF 1-2/5. R ADF/APF tr-1/5.  continued leg sensation at 1 plus out of 2. Fine motor coordination of the upper extremities was fair. Cognitively he was alert and oriented x 3 with extra time, but was slow to process . CN exam unremarkable 2-12. DTR's 2+ in lower ext's and resting tone at knees and ankles of trace to 1+ /4.  (Updated 12/26) Skin: Surgical site clean. C-collar fitting appropriately. Erythema and mild breakdown at sacrum with EPBC and stable.  Psychiatric: pleasant and more upbeat today  Assessment/Plan: 1. Functional deficits secondary to Cervical myelopathy causing tetraplegia C7 ASIA C which require 3+ hours per day of interdisciplinary therapy in a comprehensive inpatient rehab setting. Physiatrist is providing close team supervision and 24 hour management of active medical problems listed below. Physiatrist and rehab team continue to assess barriers to discharge/monitor patient progress toward functional and medical  goals.    Mobility: Bed Mobility Bed Mobility: Yes Rolling Right: 2: Max assist Rolling Right Details (indicate cue type and reason): Cues for UE use, A for bil LEs Rolling Left: 2: Max assist Rolling Left Details (indicate cue type and reason): Cues for UE use, A for bil LEs Left Sidelying to Sit: 1: +1 Total assist;HOB flat Left Sidelying to Sit Details (indicate cue type and reason): Pt able to A with bil UEs, but A needed for trunk and LEs Sitting - Scoot to Edge of Bed: 2: Max assist Sitting - Scoot to [employer] of Bed Details (indicate cue type and reason): Cues for hand placement and timing, A for anterior translation at hips Sit to Supine - Right: 1: +2 Total assist;HOB flat Scooting to HOB: 1: +2 Total assist Scooting to [facility] Details (indicate cue type and reason): A to position LEs in hooklying and cues to lift hips. A for scooting Transfers Transfers: Yes Sit to Stand: 1: +2 Total assist;With upper extremity assist;From bed Sit to Stand Details (indicate cue type and reason): A needed for lifting and lowering. Pt unable to clear hps from bed without +2 A. Tried Sara Plus, but increased thoracic pain, so stopped.  Stand to Sit: 1: +2 Total assist;With upper extremity assist;To bed Ambulation/Gait Ambulation/Gait Assistance: Not tested (comment) Stairs: No Wheelchair Mobility Wheelchair Mobility: No Distance: 60  ADL:    Cognition: Cognition Overall Cognitive Status: Appears within functional limits for tasks assessed Arousal/Alertness: Awake/alert Orientation Level: Oriented X4 Attention: Sustained Sustained Attention: Appears intact Memory: Appears intact Awareness: Appears intact Problem Solving: Appears intact Safety/Judgment: Appears intact Cognition Arousal/Alertness: Awake/alert Orientation Level: Oriented X4      Medical problem list  1.Cervical Myelopathy-S/Pcervical diskectomy with decompression 12/3.Cervical collar at all times.Decadron taper--need  to decide on schedule.  2. DVT Prophylaxis/Anticoagulation: SCD/support hose.No current signs of DVT. Dopplers are negative.   3. Pain Management: Oxycodone/robaxin.Monitor with increased activity.    4. Neurogenic bladder and hematuria/BPH-.Foley tube in place with hematuria resolving.Continue flomax for now.  -ceftaz for pseudomonas in urine  5. Neurogenic bowel/hypogastric discomfort: -am bowel  Program  -fibercon and dulcolax  -increased florastor  -explained to patient that some of pain is likely neurologic/sensory loss.  -his abdomen is soft  -rx UTI  -is on gi prophylaxis  -decadron decreased to BID today.  -stopped fiber due to frequent stools    5.Steriod induced hyperglycemia-check blood sugars ac/hs and monitor as taper steroids. Cover with SSI and rx UTI.  Continue to monitor only.  Should resolve with d/c of steroids  6.Depression-celexa daily.Provide emotional support.   7.Dysphagia-  SLP so no major issues.  Work on positioning, pace, technique.  Will review breathing techniques with patient also.  8.Hyperlipidemia-zocor  14  SWARTZ,ZACHARY T 02/10/2011, 8:19 AM

## 2011-02-11 LAB — GLUCOSE, CAPILLARY
Glucose-Capillary: 135 mg/dL — ABNORMAL HIGH (ref 70–99)
Glucose-Capillary: 111 mg/dL — ABNORMAL HIGH (ref 70–99)

## 2011-02-11 NOTE — Progress Notes (Signed)
Patient ID: Ricky Bradley, male   DOB: 12/15/1933, 77 y.o.   MRN: [id number] Patient ID: Ricky Bradley, male   DOB: 1933/02/27, 77 y.o.   MRN: [id number] Patient ID: Ricky Bradley, male   DOB: Apr 09, 1933, 77 y.o.   MRN: [id number] Subjective/Complaints: Abdominal discomfort better and  Slept well again last night.  Review of Systems  HENT: Positive for neck pain.   Gastrointestinal:       Still feels a little bloated.  Appetite reasonable though  Musculoskeletal: Positive for back pain.  All other systems reviewed and are negative.   Objective: Vital Signs: Blood pressure 109/74, pulse 84, temperature 98.7 F (37.1 C), temperature source Oral, resp. rate 17, weight 75.3 kg (166 lb 0.1 oz), SpO2 97.00%. No results found. Results for orders placed during the hospital encounter of 01/27/11 (from the past 72 hour(s))  GLUCOSE, CAPILLARY     Status: Normal   Collection Time   02/08/11 12:09 PM      Component Value Range Comment   Glucose-Capillary 81  70 - 99 (mg/dL)    Comment 1 Notify RN     GLUCOSE, CAPILLARY     Status: Normal   Collection Time   02/08/11  4:36 PM      Component Value Range Comment   Glucose-Capillary 79  70 - 99 (mg/dL)    Comment 1 Notify RN     GLUCOSE, CAPILLARY     Status: Abnormal   Collection Time   02/08/11  8:36 PM      Component Value Range Comment   Glucose-Capillary 209 (*) 70 - 99 (mg/dL)    Comment 1 Notify RN     GLUCOSE, CAPILLARY     Status: Abnormal   Collection Time   02/09/11  7:26 AM      Component Value Range Comment   Glucose-Capillary 110 (*) 70 - 99 (mg/dL)    Comment 1 Notify RN     GLUCOSE, CAPILLARY     Status: Abnormal   Collection Time   02/09/11 12:04 PM      Component Value Range Comment   Glucose-Capillary 105 (*) 70 - 99 (mg/dL)   GLUCOSE, CAPILLARY     Status: Normal   Collection Time   02/09/11  4:23 PM      Component Value Range Comment   Glucose-Capillary 97  70 - 99 (mg/dL)    Comment 1 Notify RN     GLUCOSE,  CAPILLARY     Status: Abnormal   Collection Time   02/09/11  8:39 PM      Component Value Range Comment   Glucose-Capillary 258 (*) 70 - 99 (mg/dL)    Comment 1 Notify RN     CBC     Status: Abnormal   Collection Time   02/10/11  6:50 AM      Component Value Range Comment   WBC 5.5  4.0 - 10.5 (K/uL)    RBC 4.43  4.22 - 5.81 (MIL/uL)    Hemoglobin 13.2  13.0 - 17.0 (g/dL)    HCT 41.3 (*) 24.4 - 52.0 (%)    MCV 84.0  78.0 - 100.0 (fL)    MCH 29.8  26.0 - 34.0 (pg)    MCHC 35.5  30.0 - 36.0 (g/dL)    RDW 01.0 (*) 27.2 - 15.5 (%)    Platelets 124 (*) 150 - 400 (K/uL)   BASIC METABOLIC PANEL     Status: Abnormal   Collection Time  02/10/11  6:50 AM      Component Value Range Comment   Sodium 133 (*) 135 - 145 (mEq/L)    Potassium 3.4 (*) 3.5 - 5.1 (mEq/L)    Chloride 98  96 - 112 (mEq/L)    CO2 24  19 - 32 (mEq/L)    Glucose, Bld 106 (*) 70 - 99 (mg/dL)    BUN 18  6 - 23 (mg/dL)    Creatinine, Ser 1.61  0.50 - 1.35 (mg/dL)    Calcium 8.4  8.4 - 10.5 (mg/dL)    GFR calc non Af Amer >90  >90 (mL/min)    GFR calc Af Amer >90  >90 (mL/min)   GLUCOSE, CAPILLARY     Status: Abnormal   Collection Time   02/10/11  7:16 AM      Component Value Range Comment   Glucose-Capillary 106 (*) 70 - 99 (mg/dL)    Comment 1 Notify RN     GLUCOSE, CAPILLARY     Status: Abnormal   Collection Time   02/10/11 11:50 AM      Component Value Range Comment   Glucose-Capillary 128 (*) 70 - 99 (mg/dL)    Comment 1 Notify RN     GLUCOSE, CAPILLARY     Status: Abnormal   Collection Time   02/10/11  4:50 PM      Component Value Range Comment   Glucose-Capillary 122 (*) 70 - 99 (mg/dL)   GLUCOSE, CAPILLARY     Status: Abnormal   Collection Time   02/10/11  8:53 PM      Component Value Range Comment   Glucose-Capillary 190 (*) 70 - 99 (mg/dL)    Comment 1 Notify RN      Physical Exam  Constitutional: He is oriented to person, place, and time. Appears fatigued HENT:  Head: Normocephalic.  Neck:   Collar in place  Cardiovascular: Normal rate.  Pulmonary/Chest: Breath sounds normal. He has no wheezes.  Abdominal: He exhibits no distension. There is no tenderness. Bowel sounds are active. Musculoskeletal: He exhibits no edema.  Neurological: He is alert and oriented to person, place, and time. A sensory deficit is present. Coordination reduced in BUEs Patient with 4/5 strength in R upper extremity, 3/5 on Left. HI are improving and now 3-3+/5. Marland Kitchen In the lower extremities strength was tr/5 proximally at the right hip flexors 1-tr at the right knee extensors and flexors and to tr-1/5 right. Left leg 1-1+/5 generally (left is a little stronger than right leg). Left ADF/APF 1-2/5. R ADF/APF tr-1/5.  continued leg sensation at 1 plus out of 2. Fine motor coordination of the upper extremities was fair. Cognitively he was alert and oriented x 3 with extra time, but was slow to process . CN exam unremarkable 2-12. DTR's 2+ in lower ext's and resting tone at knees and ankles of trace to 1+ /4.   Skin: Surgical site clean. C-collar fitting appropriately. Erythema and mild breakdown at sacrum with EPBC and stable.  Psychiatric: pleasant and more upbeat today    Assessment/Plan: 1. Functional deficits secondary to Cervical myelopathy causing tetraplegia C7 ASIA C which require 3+ hours per day of interdisciplinary therapy in a comprehensive inpatient rehab setting. Physiatrist is providing close team supervision and 24 hour management of active medical problems listed below. Physiatrist and rehab team continue to assess barriers to discharge/monitor patient progress toward functional and medical goals.    Mobility: Bed Mobility Bed Mobility: Yes Rolling Right: 2: Max assist Rolling Right  Details (indicate cue type and reason): Cues for UE use, A for bil LEs Rolling Left: 2: Max assist Rolling Left Details (indicate cue type and reason): Cues for UE use, A for bil LEs Left Sidelying to Sit: 1: +1  Total assist;HOB flat Left Sidelying to Sit Details (indicate cue type and reason): Pt able to A with bil UEs, but A needed for trunk and LEs Sitting - Scoot to Edge of Bed: 2: Max assist Sitting - Scoot to [employer] of Bed Details (indicate cue type and reason): Cues for hand placement and timing, A for anterior translation at hips Sit to Supine - Right: 1: +2 Total assist;HOB flat Scooting to HOB: 1: +2 Total assist Scooting to [facility] Details (indicate cue type and reason): A to position LEs in hooklying and cues to lift hips. A for scooting Transfers Transfers: Yes Sit to Stand: 1: +2 Total assist;With upper extremity assist;From bed Sit to Stand Details (indicate cue type and reason): A needed for lifting and lowering. Pt unable to clear hps from bed without +2 A. Tried Sara Plus, but increased thoracic pain, so stopped.  Stand to Sit: 1: +2 Total assist;With upper extremity assist;To bed Ambulation/Gait Ambulation/Gait Assistance: Not tested (comment) Stairs: No Wheelchair Mobility Wheelchair Mobility: No Distance: 60  ADL:    Cognition: Cognition Overall Cognitive Status: Appears within functional limits for tasks assessed Arousal/Alertness: Awake/alert Orientation Level: Oriented X4 Attention: Sustained Sustained Attention: Appears intact Memory: Appears intact Awareness: Appears intact Problem Solving: Appears intact Safety/Judgment: Appears intact Cognition Arousal/Alertness: Awake/alert Orientation Level: Oriented X4      Medical problem list  1.Cervical Myelopathy-S/Pcervical diskectomy with decompression 12/3.Cervical collar at all times.Decadron taper  2. DVT Prophylaxis/Anticoagulation: SCD/support hose.No current signs of DVT. Dopplers are negative.   3. Pain Management: Oxycodone/robaxin.Monitor with increased activity.    4. Neurogenic bladder and hematuria/BPH-.Foley tube in place with hematuria resolving.Continue flomax for now.  -ceftaz for pseudomonas in  urine  5. Neurogenic bowel/hypogastric discomfort: -am bowel  Program  -fibercon and dulcolax  -increased florastor  -explained to patient that some of pain is likely neurologic/sensory loss.  -lacks sphincter tone which doesn't help things  -rx UTI  -is on gi prophylaxis  -decadron decreased to BID today.  -stopped fiber due to frequent stools.    5.Steriod induced hyperglycemia-check blood sugars ac/hs and monitor as taper steroids. Cover with SSI and rx UTI.  Sugars are improving.  6.Depression-celexa daily.Provide emotional support.   7.Dysphagia-  SLP so no major issues.  Work on positioning, pace, technique.  Will review breathing techniques with patient also.  8.Hyperlipidemia-zocor  15  Nakhi Choi T 02/11/2011, 7:34 AM

## 2011-02-11 NOTE — Progress Notes (Signed)
Occupational Therapy Session Note  Patient Details  Name: Ricky Bradley MRN: [id number] Date of Birth: May 21, 1933  Today's Date: 02/11/2011 Time: 4098-1191 Time Calculation (min):50 min  Precautions: Precautions Precautions: Fall Precaution Comments: Cervical precautions Required Braces or Orthoses: Yes Cervical Brace: Hard collar;Applied in supine position Restrictions Weight Bearing Restrictions: No  Short Term Goals: OT Short Term Goal 1: Pt. will be minimal assist with feeding self OT Short Term Goal 1 - Progress: Met OT Short Term Goal 2: Pt. will be be set up with UB bathing OT Short Term Goal 2 - Progress: Met OT Short Term Goal 3: Pt.  will verbalize strategies for pressur relief with minimal cues OT Short Term Goal 3 - Progress: Met OT Short Term Goal 4: Pt.  will sit up for 2 hours daily in chair OT Short Term Goal 4 - Progress: Met  Skilled Therapeutic Interventions/Progress Updates:    Addressed wc mobility, transfers using sliding board, UE AROM using arm bike for 3 minutes.  Pt transferred from bed to wc with max assist and minimal instructional cues to lean forward plus manual facilitation.  Instructed pt on pressure relief while in wc.  He was able to do with mod assist to lean sideways.     Pain:  Numbness in hands; no number given   Therapy/Group: Individual Therapy  Humberto Seals 02/11/2011, 2:55 PM

## 2011-02-11 NOTE — Progress Notes (Signed)
Patient on bowel program. Suppository given this morning with medium bowel movement.slideboard for transfers with 1 assist. Foley cather draining clear yellow urine. Set required during meals for opening containers.

## 2011-02-12 LAB — GLUCOSE, CAPILLARY: Glucose-Capillary: 108 mg/dL — ABNORMAL HIGH (ref 70–99)

## 2011-02-12 NOTE — Progress Notes (Signed)
Occupational Therapy Session Note  Patient Details  Name: Ricky Bradley MRN: [id number] Date of Birth: 05-18-1933  Today's Date: 02/12/2011 Time: 1030-1130 Time Calculation (min): 60 min  Precautions: Precautions Precautions: Fall Precaution Comments: Cervical precautions Required Braces or Orthoses: Yes Cervical Brace: Hard collar;Applied in supine position Restrictions Weight Bearing Restrictions: No  Short Term Goals: OT Short Term Goal 1: Pt. will be minimal assist with feeding self OT Short Term Goal 1 - Progress: Met OT Short Term Goal 2: Pt. will be be set up with UB bathing OT Short Term Goal 2 - Progress: Met OT Short Term Goal 3: Pt.  will verbalize strategies for pressur relief with minimal cues OT Short Term Goal 3 - Progress: Met OT Short Term Goal 4: Pt.  will sit up for 2 hours daily in chair OT Short Term Goal 4 - Progress: Met  Skilled Therapeutic Interventions/Progress Updates:    Engaged in therapeutic bathing upper body at the sink at wc level.  Addressed sitting balance, supported sitting to upright sitting with no support, BUE AROM in the course of reaching, opening, turning, and gripping.  Pt stated he was buring on his bottom.  Addressed transfer training from wc to bed with sliding board.  Educated pt on steps for transfers and he was able to repeat.  He carried over the education into the transfer and did better than previous sessions.  Pt able to direct care about 75 % of time.      Pain Pain Assessment Pain Assessment: No/denies pain Pain Score: 0-No pain ADLS see FIM     Therapy/Group: Individual Therapy  Humberto Seals 02/12/2011, 12:20 PM

## 2011-02-12 NOTE — Progress Notes (Signed)
Occupational Therapy Session Note  Patient Details  Name: TRIMAINE MASER MRN: [id number] Date of Birth: 1933/08/13  Today's Date: 02/12/2011 Time: 1300-1330 Time Calculation (min):  Precautions: Precautions Precautions: Fall Precaution Comments: Cervical precautions Required Braces or Orthoses: Yes Cervical Brace: Hard collar;Applied in supine position Restrictions Weight Bearing Restrictions: No  Short Term Goals: OT Short Term Goal 1: Pt. will be minimal assist with feeding self OT Short Term Goal 1 - Progress: Met OT Short Term Goal 2: Pt. will be be set up with UB bathing OT Short Term Goal 2 - Progress: Met OT Short Term Goal 3: Pt.  will verbalize strategies for pressur relief with minimal cues OT Short Term Goal 3 - Progress: Met OT Short Term Goal 4: Pt.  will sit up for 2 hours daily in chair OT Short Term Goal 4 - Progress: Met  Skilled Therapeutic Interventions/Progress Updates:    Addressed bed mobility, transfer to wc from bed, UE functional tasks at sink.  Pt rolling to right and left with him leading the Upper body and assist with the lower body (mod assist.)  Pt transferred from bed to wc with max assist using sliding board.  Pt groomed at set up assist.   Pain;  none    Therapy/Group: Individual Therapy  Humberto Seals 02/12/2011, 5:12 PM

## 2011-02-12 NOTE — Progress Notes (Signed)
Physical Therapy Note  Patient Details  Name: Ricky Bradley MRN: [id number] Date of Birth: August 02, 1933 Today's Date: 02/12/2011  1405-1455 (50 minutes) individual treatment session Pain - no complaint of pain Focus of treatment : UE strengthening to decrease assist with transfers; therapeutic activities to improve tolerance to activity Treatment: Bilateral UE shoulder depression, triceps extension, scapular adduction, biceps flexion 3 X 15 with orange theraband (increased reps and progression from yellow to orange theraband) ; UE ergonometer (level 2) 3X 2 minutes with 2 minute rest .   [name],JIM 02/12/2011, 2:16 PM

## 2011-02-12 NOTE — Progress Notes (Signed)
Patient alert and oriented x 3 - Patient denied any pain so far this shift. Patient has some STMD evident and bed alarm in use. Patient has foley in place with last bowel movement x 2. Patient has bowel movement prior to morning suppository. Patient incontinent. Hemorrhoid cream applied. Patient wearing aspen collar at all times- neck incision OTA with steris. Patient taking pills whole with applesauce. Pt able to scoot pivot with 2 assist. Continue with plan of care. No new complaints noted.

## 2011-02-12 NOTE — Progress Notes (Signed)
Physical Therapy Session Note  Patient Details  Name: Ricky Bradley MRN: [id number] Date of Birth: 06-03-1933  Today's Date: 02/12/2011 Time: 4742-5956 Time Calculation (min): 60 min  Precautions: Precautions Precautions: Fall Precaution Comments: Cervical precautions Required Braces or Orthoses: Yes Cervical Brace: Hard collar;Applied in supine position Restrictions Weight Bearing Restrictions: No  Short Term Goals: PT Short Term Goal 1: Perform all bed mobility Max A  PT Short Term Goal 1 - Progress: Met PT Short Term Goal 2: Perform all functional transfers Max A  PT Short Term Goal 2 - Progress: Met PT Short Term Goal 3: Maintain static sitting balance with Min PT Short Term Goal 3 - Progress: Met PT Short Term Goal 4: Maintain static standing balance Mod A PT Short Term Goal 4 - Progress: Progressing toward goal PT Short Term Goal 5: WC mobility Mod A x 50' PT Short Term Goal 5 - Progress: Progressing toward goal  Skilled Therapeutic Interventions/Progress Updates: Therapeutic activities to improve bed mobility/transfers/static sitting balance          Pain no complaint of pain   Mobility :    Bed mobility: rolling right or left mod assist with bedrails with vcs to use rails; supine to sit max assist +1   Transfers: Max assist + 1 with second person for safety (therapist only)   Locomotion - wc mobility - not tested    Trunk/Postural Assessment   Pt sits in posterior pelvic tilt Balance    Sitting: Pt unable to attain midline static sitting position without assist; Forward /sideways / backwards trunk movement with ball in front of patient using UEs as assist; partial sit to RT/LT side to sit to facilitate use of UE to assist; trunk circles to facilitate weight shifts; sitting using UE ergonometer to improve tolerance to activity. Pt. Requires frequent rest breaks.   Exercises    Other Treatments    Therapy/Group: Individual Therapy  Raul Winterhalter,JIM 02/12/2011,  9:35 AM

## 2011-02-12 NOTE — Progress Notes (Signed)
Subjective/Complaints: Abdominal discomfort better and  Slept well yet again.  Review of Systems  HENT: Positive for neck pain.   Gastrointestinal:       Still feels a little bloated.  Appetite reasonable though  Musculoskeletal: Positive for back pain.  All other systems reviewed and are negative.   Objective: Vital Signs: Blood pressure 106/61, pulse 77, temperature 98.2 F (36.8 C), temperature source Oral, resp. rate 18, weight 75.3 kg (166 lb 0.1 oz), SpO2 98.00%. No results found. Results for orders placed during the hospital encounter of 01/27/11 (from the past 72 hour(s))  GLUCOSE, CAPILLARY     Status: Abnormal   Collection Time   02/09/11 12:04 PM      Component Value Range Comment   Glucose-Capillary 105 (*) 70 - 99 (mg/dL)   GLUCOSE, CAPILLARY     Status: Normal   Collection Time   02/09/11  4:23 PM      Component Value Range Comment   Glucose-Capillary 97  70 - 99 (mg/dL)    Comment 1 Notify RN     GLUCOSE, CAPILLARY     Status: Abnormal   Collection Time   02/09/11  8:39 PM      Component Value Range Comment   Glucose-Capillary 258 (*) 70 - 99 (mg/dL)    Comment 1 Notify RN     CBC     Status: Abnormal   Collection Time   02/10/11  6:50 AM      Component Value Range Comment   WBC 5.5  4.0 - 10.5 (K/uL)    RBC 4.43  4.22 - 5.81 (MIL/uL)    Hemoglobin 13.2  13.0 - 17.0 (g/dL)    HCT 40.9 (*) 81.1 - 52.0 (%)    MCV 84.0  78.0 - 100.0 (fL)    MCH 29.8  26.0 - 34.0 (pg)    MCHC 35.5  30.0 - 36.0 (g/dL)    RDW 91.4 (*) 78.2 - 15.5 (%)    Platelets 124 (*) 150 - 400 (K/uL)   BASIC METABOLIC PANEL     Status: Abnormal   Collection Time   02/10/11  6:50 AM      Component Value Range Comment   Sodium 133 (*) 135 - 145 (mEq/L)    Potassium 3.4 (*) 3.5 - 5.1 (mEq/L)    Chloride 98  96 - 112 (mEq/L)    CO2 24  19 - 32 (mEq/L)    Glucose, Bld 106 (*) 70 - 99 (mg/dL)    BUN 18  6 - 23 (mg/dL)    Creatinine, Ser 9.56  0.50 - 1.35 (mg/dL)    Calcium 8.4  8.4 -  10.5 (mg/dL)    GFR calc non Af Amer >90  >90 (mL/min)    GFR calc Af Amer >90  >90 (mL/min)   GLUCOSE, CAPILLARY     Status: Abnormal   Collection Time   02/10/11  7:16 AM      Component Value Range Comment   Glucose-Capillary 106 (*) 70 - 99 (mg/dL)    Comment 1 Notify RN     GLUCOSE, CAPILLARY     Status: Abnormal   Collection Time   02/10/11 11:50 AM      Component Value Range Comment   Glucose-Capillary 128 (*) 70 - 99 (mg/dL)    Comment 1 Notify RN     GLUCOSE, CAPILLARY     Status: Abnormal   Collection Time   02/10/11  4:50 PM  Component Value Range Comment   Glucose-Capillary 122 (*) 70 - 99 (mg/dL)   GLUCOSE, CAPILLARY     Status: Abnormal   Collection Time   02/10/11  8:53 PM      Component Value Range Comment   Glucose-Capillary 190 (*) 70 - 99 (mg/dL)    Comment 1 Notify RN     GLUCOSE, CAPILLARY     Status: Abnormal   Collection Time   02/11/11  7:45 AM      Component Value Range Comment   Glucose-Capillary 135 (*) 70 - 99 (mg/dL)    Comment 1 Notify RN     GLUCOSE, CAPILLARY     Status: Abnormal   Collection Time   02/11/11 11:44 AM      Component Value Range Comment   Glucose-Capillary 110 (*) 70 - 99 (mg/dL)    Comment 1 Notify RN     GLUCOSE, CAPILLARY     Status: Abnormal   Collection Time   02/11/11  4:53 PM      Component Value Range Comment   Glucose-Capillary 111 (*) 70 - 99 (mg/dL)    Comment 1 Notify RN     GLUCOSE, CAPILLARY     Status: Abnormal   Collection Time   02/11/11  8:10 PM      Component Value Range Comment   Glucose-Capillary 191 (*) 70 - 99 (mg/dL)    Comment 1 Notify RN     GLUCOSE, CAPILLARY     Status: Abnormal   Collection Time   02/12/11  7:27 AM      Component Value Range Comment   Glucose-Capillary 108 (*) 70 - 99 (mg/dL)    Comment 1 Notify RN      Physical Exam  Constitutional: He is oriented to person, place, and time. Appears fatigued HENT:  Head: Normocephalic.  Neck:  Collar in place  Cardiovascular:  Normal rate.  Pulmonary/Chest: Breath sounds normal. He has no wheezes.  Abdominal: He exhibits no distension. There is no tenderness. Bowel sounds are active. Musculoskeletal: He exhibits no edema.  Neurological: He is alert and oriented to person, place, and time. A sensory deficit is present. Coordination reduced in BUEs Patient with 4/5 strength in R upper extremity, 3/5 on Left. HI are improving and now 3-3+/5. Marland Kitchen In the lower extremities strength was tr/5 proximally at the right hip flexors 1-tr at the right knee extensors and flexors and to tr-1/5 right. Left leg 1-1+/5 generally (left is a little stronger than right leg). Left ADF/APF 1-2/5. R ADF/APF tr-1/5.  continued leg sensation at 1 plus out of 2. Fine motor coordination of the upper extremities was fair. Cognitively he was alert and oriented x 3 with extra time, but was slow to process . CN exam unremarkable 2-12. DTR's 2+ in lower ext's and resting tone at knees and ankles of trace to 1+ /4.   Skin: Surgical site clean. C-collar fitting appropriately. Erythema and mild breakdown at sacrum with EPBC and stable.  Psychiatric: pleasant and very alert.  In good spirits.    Assessment/Plan: 1. Functional deficits secondary to Cervical myelopathy causing tetraplegia C7 ASIA C which require 3+ hours per day of interdisciplinary therapy in a comprehensive inpatient rehab setting. Physiatrist is providing close team supervision and 24 hour management of active medical problems listed below. Physiatrist and rehab team continue to assess barriers to discharge/monitor patient progress toward functional and medical goals.    Mobility: Bed Mobility Bed Mobility: Yes Rolling Right: 2: Max assist  Rolling Right Details (indicate cue type and reason): Cues for UE use, A for bil LEs Rolling Left: 2: Max assist Rolling Left Details (indicate cue type and reason): Cues for UE use, A for bil LEs Left Sidelying to Sit: 1: +1 Total assist;HOB  flat Left Sidelying to Sit Details (indicate cue type and reason): Pt able to A with bil UEs, but A needed for trunk and LEs Sitting - Scoot to Edge of Bed: 2: Max assist Sitting - Scoot to [place] of Bed Details (indicate cue type and reason): Cues for hand placement and timing, A for anterior translation at hips Sit to Supine - Right: 1: +2 Total assist;HOB flat Scooting to HOB: 1: +2 Total assist Scooting to [facility] Details (indicate cue type and reason): A to position LEs in hooklying and cues to lift hips. A for scooting Transfers Transfers: Yes Sit to Stand: 1: +2 Total assist;With upper extremity assist;From bed Sit to Stand Details (indicate cue type and reason): A needed for lifting and lowering. Pt unable to clear hps from bed without +2 A. Tried Sara Plus, but increased thoracic pain, so stopped.  Stand to Sit: 1: +2 Total assist;With upper extremity assist;To bed Ambulation/Gait Ambulation/Gait Assistance: Not tested (comment) Stairs: No Wheelchair Mobility Wheelchair Mobility: No Distance: 60  ADL:    Cognition: Cognition Overall Cognitive Status: Appears within functional limits for tasks assessed Arousal/Alertness: Awake/alert Orientation Level: Oriented X4 Attention: Sustained Sustained Attention: Appears intact Memory: Appears intact Awareness: Appears intact Problem Solving: Appears intact Safety/Judgment: Appears intact Cognition Arousal/Alertness: Awake/alert Orientation Level: Oriented X4      Medical problem list  1.Cervical Myelopathy-S/Pcervical diskectomy with decompression 12/3.Cervical collar at all times.Decadron taper  2. DVT Prophylaxis/Anticoagulation: SCD/support hose.No current signs of DVT. Dopplers are negative.   3. Pain Management: Oxycodone/robaxin.Monitor with increased activity.    4. Neurogenic bladder and hematuria/BPH-.Foley tube in place with hematuria resolving.Continue flomax for now.  -ceftaz for pseudomonas in urine  5.  Neurogenic bowel/hypogastric discomfort: -am bowel  Program  -fibercon and dulcolax  -increased florastor  -explained to patient that some of pain is likely neurologic/sensory loss.  -lacks sphincter tone which doesn't help things  -rx UTI with ceftaz  -is on gi prophylaxis  -decadron decreased to BID today.  -stopped fiber due to frequent stools.    5.Steriod induced hyperglycemia-check blood sugars ac/hs and monitor as taper steroids. Cover with SSI and rx UTI.  Sugars are improving.  6.Depression-celexa daily.Provide emotional support.   7.Dysphagia-  SLP so no major issues.  Work on positioning, pace, technique.  Will review breathing techniques with patient also.  8.Hyperlipidemia-zocor  16  Renarda Mullinix T 02/12/2011, 9:04 AM

## 2011-02-13 LAB — GLUCOSE, CAPILLARY
Glucose-Capillary: 117 mg/dL — ABNORMAL HIGH (ref 70–99)
Glucose-Capillary: 104 mg/dL — ABNORMAL HIGH (ref 70–99)

## 2011-02-13 MED ORDER — INSULIN ASPART 100 UNIT/ML ~~LOC~~ SOLN: 0.0000 [IU] | Freq: Every day | SUBCUTANEOUS | Status: DC

## 2011-02-13 NOTE — Progress Notes (Signed)
Occupational Therapy Session Note  Patient Details  Name: LESLEY GALENTINE MRN: [id number] Date of Birth: 05-Nov-1933  Today's Date: 02/13/2011 Time: 1027-2536 Time Calculation (min): 45 min  Precautions: Precautions Precautions: Fall Precaution Comments: Cervical precautions Required Braces or Orthoses: Yes Cervical Brace: Hard collar;Applied in supine position Restrictions Weight Bearing Restrictions: No   Skilled Therapeutic Interventions/Progress Updates:    Focus on BLE management with leg loops to assist with bed mobility and supine to sit and sit to supine transitional movements.  Focus on static sitting balance and dynamic sitting balance sitting edge of mat.  Pt supervision for static sitting balance with BUE support and mod assist for dynamic sitting.  Pt exhibits increased BUE strength evidenced with increased ability to push up from leaning on elbows.    Pain Pain Assessment Pain Assessment: No/denies pain  Therapy/Group: Individual Therapy  [name] 02/13/2011, 3:05 PM

## 2011-02-13 NOTE — Progress Notes (Signed)
Pt had mod. soft formed stool this AM with bowel program; had already been incont. sm soft stool prior;  Per OT report pt had 1 mod soft stool incont this afternoon.  Rectal vault checked after each incontinence episode, no stool felt in rectum with checks.  Anusol for discomfort per orders.  Oxy IR x 2 for c/o headache, effective.Carlean Purl

## 2011-02-13 NOTE — Progress Notes (Signed)
Occupational Therapy Session Note and Weekly Progress Note  Patient Details  Name: Ricky Bradley MRN: [id number] Date of Birth: January 17, 1934  Today's Date: 02/13/2011 Time: 1000-1100 Time Calculation (min): 60 min    Therapy Documentation Precautions: Precautions Precautions: Fall Precaution Comments: Cervical precautions Required Braces or Orthoses: Yes Cervical Brace: Hard collar;Applied in supine position Restrictions Weight Bearing Restrictions: No  Skilled Therapeutic Interventions/Progress Updates:    ADL retraining.  LB bathing and dressing completed supine with HOB elevated.  Pt attempted to bathe B lower legs in circle sitting with HOB elevated for support.  Pt continues to require assistance with bathing lower legs.  Pt unable to sit in circle sitting unsupported.  Sliding board transfer to w/c to complete UB bathing and dressing w/c level at sink.  Pt setup for UB bathing and mod A for UB dressing with assistance required to button shirt and bring shirt around back.  Focus on static and dynamic sitting balance supported in w/c and unsupported on EOB.  Pt requires BUE support for sitting EOB.  Additional focus on activity tolerance.  Pt exhibits some dizziness when sitting EOB but resolves in approx 1 minute.   Pain Pain Assessment Pain Assessment: No/denies pain  Therapy/Group: Individual Therapy   Weekly Summary/Progress Note Progress has been steady this past week.  Pt exhibits increased ability to incorporate BUE into ADLs and is currently setup for UB bathing at sink in supported sitting in w/c.   Pt is mod A for LB bathing supine with HOB elevated. Pt continues to require mod A for UB dressing with button up shirt and total assist for LB dressing.  Pt sits EOB with BUE support with close supervision; max A for dynamic sitting EOB and min A for dynamic sitting balance seated in w/c.  Pt beginning to engage in circle sitting with support for back but unable to complete  functional task in this position. Patient fluctuates between max -> total assist for slide board transfers with one person.   Discharge to ?SNF?  [name] [facility] 02/13/2011, 12:20 PM

## 2011-02-13 NOTE — Progress Notes (Signed)
Speech Language Pathology Therapy Note & Discharge Summary  Patient Details  Name: Ricky Bradley MRN: [id number] Date of Birth: 17-Sep-1933  Today's Date: 02/13/2011 Time: 0800-0830 Time Calculation (min): 30 min  Precautions: Precautions Precautions: Fall Precaution Comments: Cervical precautions Required Braces or Orthoses: Yes Cervical Brace: Hard collar;Applied in supine position Restrictions Weight Bearing Restrictions: No  Skilled Therapeutic Interventions: Pt without overt s/s of aspiration with regular textures and thin liquids. Pt recalled swallowing strategies with Mod I. Reported no more difficulty taking medications. Pt's swallowing function appears to be at baseline and pt will be discharged from skilled SLP caseload.   Oral/Motor: Oral Motor/Sensory Function Overall Oral Motor/Sensory Function: Appears within functional limits for tasks assessed  Therapy/Group: Individual Therapy  Speech Language Pathology Discharge Summary   Long term goals set: 1  Long term goals met: 1  Comments on progress toward goals: Pt has made functional progress and has met all LTG's this admission. Currently, pt is Mod I for use of swallowing compensatory strategies of slow pace, alternating solids/liquis and demonstrating appropriate positioning during meals. Pt without overt s/s of aspiration and reports no more difficulty taking medications. Pt/family education complete. Pt will be discharged from skilled SLP caseload secondary to meeting all treatment goals and no f/u needed at this time.   Reasons goals not met: N/A  Equipment acquired: N/A  Reasons for discharge: treatment goals met  Follow-up: N/A  Patient/family agrees with progress made and goals achieved: Yes  Twilia Yaklin 02/13/2011    Brentley Landfair 02/13/2011 9:06 AM

## 2011-02-13 NOTE — Progress Notes (Signed)
Physical Therapy Note  Patient Details  Name: Ricky Bradley MRN: [id number] Date of Birth: 1933-05-14 Today's Date: 02/13/2011  13:00- 14:00 individual therapy, pt denied pain.  wc mobility supervision 150', sliding board transfers ranged from max to total assist. Transfer training with beasy board max assist with vc for trunk extension and forward weight shift and for head placement. Sit - supine mod assist, total assist needed to manage rt. leg and min assist for left using leg loops. Pt.'s legs positioned in circle sitting to stretch hips while he performed chest press and upper trunk stabilization exercises with 2# bar and hand exercises with theraputty.used maxislide under bottom with knees bent to activate pelvic muscles to aid transfers. Some activation in lower trunk left >rt.   Ricky Bradley 02/13/2011, 3:40 PM

## 2011-02-13 NOTE — Progress Notes (Signed)
Patient ID: Ricky Bradley, male   DOB: [birth year], 77 y.o.   MRN: [id number] Subjective/Complaints: Abdominal discomfort better and  Slept well yet again.  Review of Systems  HENT: Positive for neck pain.   Gastrointestinal:       Still feels a little bloated.  Appetite reasonable though  Musculoskeletal: Positive for back pain.  All other systems reviewed and are negative.   Objective: Vital Signs: Blood pressure 113/72, pulse 79, temperature 98.5 F (36.9 C), temperature source Oral, resp. rate 18, weight 73.6 kg (162 lb 4.1 oz), SpO2 99.00%. No results found. Results for orders placed during the hospital encounter of 01/27/11 (from the past 72 hour(s))  GLUCOSE, CAPILLARY     Status: Abnormal   Collection Time   02/10/11  7:16 AM      Component Value Range Comment   Glucose-Capillary 106 (*) 70 - 99 (mg/dL)    Comment 1 Notify RN     GLUCOSE, CAPILLARY     Status: Abnormal   Collection Time   02/10/11 11:50 AM      Component Value Range Comment   Glucose-Capillary 128 (*) 70 - 99 (mg/dL)    Comment 1 Notify RN     GLUCOSE, CAPILLARY     Status: Abnormal   Collection Time   02/10/11  4:50 PM      Component Value Range Comment   Glucose-Capillary 122 (*) 70 - 99 (mg/dL)   GLUCOSE, CAPILLARY     Status: Abnormal   Collection Time   02/10/11  8:53 PM      Component Value Range Comment   Glucose-Capillary 190 (*) 70 - 99 (mg/dL)    Comment 1 Notify RN     GLUCOSE, CAPILLARY     Status: Abnormal   Collection Time   02/11/11  7:45 AM      Component Value Range Comment   Glucose-Capillary 135 (*) 70 - 99 (mg/dL)    Comment 1 Notify RN     GLUCOSE, CAPILLARY     Status: Abnormal   Collection Time   02/11/11 11:44 AM      Component Value Range Comment   Glucose-Capillary 110 (*) 70 - 99 (mg/dL)    Comment 1 Notify RN     GLUCOSE, CAPILLARY     Status: Abnormal   Collection Time   02/11/11  4:53 PM      Component Value Range Comment   Glucose-Capillary 111 (*) 70 - 99  (mg/dL)    Comment 1 Notify RN     GLUCOSE, CAPILLARY     Status: Abnormal   Collection Time   02/11/11  8:10 PM      Component Value Range Comment   Glucose-Capillary 191 (*) 70 - 99 (mg/dL)    Comment 1 Notify RN     GLUCOSE, CAPILLARY     Status: Abnormal   Collection Time   02/12/11  7:27 AM      Component Value Range Comment   Glucose-Capillary 108 (*) 70 - 99 (mg/dL)    Comment 1 Notify RN     GLUCOSE, CAPILLARY     Status: Abnormal   Collection Time   02/12/11 11:45 AM      Component Value Range Comment   Glucose-Capillary 156 (*) 70 - 99 (mg/dL)    Comment 1 Notify RN     GLUCOSE, CAPILLARY     Status: Abnormal   Collection Time   02/12/11  4:53 PM      Component  Value Range Comment   Glucose-Capillary 106 (*) 70 - 99 (mg/dL)    Comment 1 Notify RN     GLUCOSE, CAPILLARY     Status: Abnormal   Collection Time   02/12/11  8:16 PM      Component Value Range Comment   Glucose-Capillary 174 (*) 70 - 99 (mg/dL)    Comment 1 Notify RN      Physical Exam  Constitutional: He is oriented to person, place, and time. Appears fatigued HENT:  Head: Normocephalic.  Neck:  Collar in place  Cardiovascular: Normal rate.  Pulmonary/Chest: Breath sounds normal. He has no wheezes.  Abdominal: He exhibits no distension. There is no tenderness. Bowel sounds are active. Musculoskeletal: He exhibits no edema.  Neurological: He is alert and oriented to person, place, and time. A sensory deficit is present. Coordination reduced in BUEs Patient with 4/5 strength in R upper extremity, 3/5 on Left. HI are improving and now 3-3+/5. Marland Kitchen In the lower extremities strength was tr/5 proximally at the right hip flexors 1-tr at the right knee extensors and flexors and to tr-1/5 right. Left leg 1+to3/5. R ADF/APF tr-1/5. Prox RLE 1/5.  continued leg sensation at 1 plus out of 2. Fine motor coordination of the upper extremities was fair. Cognitively he was alert and oriented x 3 with extra time, but was  slow to process . CN exam unremarkable 2-12. DTR's 2+ in lower ext's and resting tone at knees and ankles of trace to tr/4.   Skin: Surgical site clean. C-collar fitting appropriately. Erythema and mild breakdown at sacrum with EPBC and stable.  Psychiatric: pleasant and very alert.  In good spirits. 12/31exam    Assessment/Plan: 1. Functional deficits secondary to Cervical myelopathy causing tetraplegia C7 ASIA C which require 3+ hours per day of interdisciplinary therapy in a comprehensive inpatient rehab setting. Physiatrist is providing close team supervision and 24 hour management of active medical problems listed below. Physiatrist and rehab team continue to assess barriers to discharge/monitor patient progress toward functional and medical goals.    Mobility: Bed Mobility Bed Mobility: Yes Rolling Right: 2: Max assist Rolling Right Details (indicate cue type and reason): Cues for UE use, A for bil LEs Rolling Left: 2: Max assist Rolling Left Details (indicate cue type and reason): Cues for UE use, A for bil LEs Left Sidelying to Sit: 1: +1 Total assist;HOB flat Left Sidelying to Sit Details (indicate cue type and reason): Pt able to A with bil UEs, but A needed for trunk and LEs Sitting - Scoot to Edge of Bed: 2: Max assist Sitting - Scoot to [employer] of Bed Details (indicate cue type and reason): Cues for hand placement and timing, A for anterior translation at hips Sit to Supine - Right: 1: +2 Total assist;HOB flat Scooting to HOB: 1: +2 Total assist Scooting to [facility] Details (indicate cue type and reason): A to position LEs in hooklying and cues to lift hips. A for scooting Transfers Transfers: Yes Sit to Stand: 1: +2 Total assist;With upper extremity assist;From bed Sit to Stand Details (indicate cue type and reason): A needed for lifting and lowering. Pt unable to clear hps from bed without +2 A. Tried Sara Plus, but increased thoracic pain, so stopped.  Stand to Sit: 1: +2 Total  assist;With upper extremity assist;To bed Ambulation/Gait Ambulation/Gait Assistance: Not tested (comment) Stairs: No Wheelchair Mobility Wheelchair Mobility: No Distance: 60  ADL:    Cognition: Cognition Overall Cognitive Status: Appears within functional limits  for tasks assessed Arousal/Alertness: Awake/alert Orientation Level: Oriented X4 Attention: Sustained Sustained Attention: Appears intact Memory: Appears intact Awareness: Appears intact Problem Solving: Appears intact Safety/Judgment: Appears intact Cognition Arousal/Alertness: Awake/alert Orientation Level: Oriented X4      Medical problem list  1.Cervical Myelopathy-S/Pcervical diskectomy with decompression 12/3.Cervical collar at all times.Decadron taper  2. DVT Prophylaxis/Anticoagulation: SCD/support hose.No current signs of DVT. Dopplers are negative.   3. Pain Management: Oxycodone/robaxin.Monitor with increased activity.    4. Neurogenic bladder and hematuria/BPH-.Foley tube in place with hematuria resolving.Continue flomax for now.  -ceftaz for pseudomonas in urine  5. Neurogenic bowel/hypogastric discomfort: -am bowel  Program  -fibercon and dulcolax  -increased florastor  -explained to patient that some of pain is likely neurologic/sensory loss.  -lacks sphincter tone which doesn't help things  -rx UTI with ceftaz  -is on gi prophylaxis  -decadron decreased to BID today.  -stopped fiber due to frequent stools.    5.Steriod induced hyperglycemia-check blood sugars ac/hs and monitor as taper steroids. Cover with SSI and rx UTI.  Sugars are improving. Decrease cbg checks  6.Depression-celexa daily.Provide emotional support.   7.Dysphagia-  SLP so no major issues.  Work on positioning, pace, technique.  Will review breathing techniques with patient also.  8.Hyperlipidemia-zocor  17  Evoleht Hovatter T 02/13/2011, 6:58 AM

## 2011-02-14 DIAGNOSIS — Z5189 Encounter for other specified aftercare: Secondary | ICD-10-CM

## 2011-02-14 DIAGNOSIS — N319 Neuromuscular dysfunction of bladder, unspecified: Secondary | ICD-10-CM

## 2011-02-14 DIAGNOSIS — G8250 Quadriplegia, unspecified: Secondary | ICD-10-CM

## 2011-02-14 DIAGNOSIS — M4712 Other spondylosis with myelopathy, cervical region: Secondary | ICD-10-CM

## 2011-02-14 DIAGNOSIS — K592 Neurogenic bowel, not elsewhere classified: Secondary | ICD-10-CM

## 2011-02-14 LAB — GLUCOSE, CAPILLARY: Glucose-Capillary: 185 mg/dL — ABNORMAL HIGH (ref 70–99)

## 2011-02-14 NOTE — Progress Notes (Signed)
Patient ID: Ricky Bradley, male   DOB: [birth year], 78 y.o.   MRN: [id number] Subjective/Complaints: Abdominal discomfort better and  Slept well yet again.  Review of Systems  HENT: Positive for neck pain.   Gastrointestinal:       Still feels a little bloated.  Appetite reasonable though  Musculoskeletal: Positive for back pain.  All other systems reviewed and are negative.   Objective: Vital Signs: Blood pressure 107/65, pulse 79, temperature 97.7 F (36.5 C), temperature source Oral, resp. rate 19, weight 73.6 kg (162 lb 4.1 oz), SpO2 98.00%. No results found. Results for orders placed during the hospital encounter of 01/27/11 (from the past 72 hour(s))  GLUCOSE, CAPILLARY     Status: Abnormal   Collection Time   02/11/11 11:44 AM      Component Value Range Comment   Glucose-Capillary 110 (*) 70 - 99 (mg/dL)    Comment 1 Notify RN     GLUCOSE, CAPILLARY     Status: Abnormal   Collection Time   02/11/11  4:53 PM      Component Value Range Comment   Glucose-Capillary 111 (*) 70 - 99 (mg/dL)    Comment 1 Notify RN     GLUCOSE, CAPILLARY     Status: Abnormal   Collection Time   02/11/11  8:10 PM      Component Value Range Comment   Glucose-Capillary 191 (*) 70 - 99 (mg/dL)    Comment 1 Notify RN     GLUCOSE, CAPILLARY     Status: Abnormal   Collection Time   02/12/11  7:27 AM      Component Value Range Comment   Glucose-Capillary 108 (*) 70 - 99 (mg/dL)    Comment 1 Notify RN     GLUCOSE, CAPILLARY     Status: Abnormal   Collection Time   02/12/11 11:45 AM      Component Value Range Comment   Glucose-Capillary 156 (*) 70 - 99 (mg/dL)    Comment 1 Notify RN     GLUCOSE, CAPILLARY     Status: Abnormal   Collection Time   02/12/11  4:53 PM      Component Value Range Comment   Glucose-Capillary 106 (*) 70 - 99 (mg/dL)    Comment 1 Notify RN     GLUCOSE, CAPILLARY     Status: Abnormal   Collection Time   02/12/11  8:16 PM      Component Value Range Comment   Glucose-Capillary 174 (*) 70 - 99 (mg/dL)    Comment 1 Notify RN     GLUCOSE, CAPILLARY     Status: Abnormal   Collection Time   02/13/11  7:27 AM      Component Value Range Comment   Glucose-Capillary 117 (*) 70 - 99 (mg/dL)    Comment 1 Notify RN     GLUCOSE, CAPILLARY     Status: Abnormal   Collection Time   02/13/11  4:43 PM      Component Value Range Comment   Glucose-Capillary 104 (*) 70 - 99 (mg/dL)    Comment 1 Notify RN     GLUCOSE, CAPILLARY     Status: Abnormal   Collection Time   02/14/11  6:50 AM      Component Value Range Comment   Glucose-Capillary 112 (*) 70 - 99 (mg/dL)    Physical Exam  Constitutional: He is oriented to person, place, and time. Appears fatigued HENT:  Head: Normocephalic.  Neck:  Collar in  place  Cardiovascular: Normal rate.  Pulmonary/Chest: Breath sounds normal. He has no wheezes.  Abdominal: He exhibits no distension. There is no tenderness. Bowel sounds are active. Musculoskeletal: He exhibits no edema.  Neurological: He is alert and oriented to person, place, and time. A sensory deficit is present. Coordination reduced in BUEs Patient with 4/5 strength in R upper extremity, 3/5 on Left. HI are improving and now 3-3+/5. Marland Kitchen In the lower extremities strength was tr/5 proximally at the right hip flexors 1-tr at the right knee extensors and flexors and to tr-1/5 right. Left leg 1+to3/5. R ADF/APF tr-1/5. Prox RLE 1/5.  continued leg sensation at 1 plus out of 2. Fine motor coordination of the upper extremities was fair. Cognitively he was alert and oriented x 3 with extra time, but was slow to process . CN exam unremarkable 2-12. DTR's 2+ in lower ext's and resting tone at knees and ankles of trace to tr/4.   Skin: Surgical site clean. C-collar fitting appropriately. Erythema and mild breakdown at sacrum with EPBC and stable.  Psychiatric: pleasant and very alert.  In good spirits. 12/31exam    Assessment/Plan: 1. Functional deficits secondary to  Cervical myelopathy causing tetraplegia C7 ASIA C which require 3+ hours per day of interdisciplinary therapy in a comprehensive inpatient rehab setting. Physiatrist is providing close team supervision and 24 hour management of active medical problems listed below. Physiatrist and rehab team continue to assess barriers to discharge/monitor patient progress toward functional and medical goals.  Mobility: Bed Mobility Bed Mobility: Yes Rolling Right: 2: Max assist Rolling Right Details (indicate cue type and reason): Cues for UE use, A for bil LEs Rolling Left: 2: Max assist Rolling Left Details (indicate cue type and reason): Cues for UE use, A for bil LEs Left Sidelying to Sit: 1: +1 Total assist;HOB flat Left Sidelying to Sit Details (indicate cue type and reason): Pt able to A with bil UEs, but A needed for trunk and LEs Sitting - Scoot to Edge of Bed: 2: Max assist Sitting - Scoot to [employer] of Bed Details (indicate cue type and reason): Cues for hand placement and timing, A for anterior translation at hips Sit to Supine - Right: 1: +2 Total assist;HOB flat Scooting to HOB: 1: +2 Total assist Scooting to [facility] Details (indicate cue type and reason): A to position LEs in hooklying and cues to lift hips. A for scooting Transfers Transfers: Yes Sit to Stand: 1: +2 Total assist;With upper extremity assist;From bed Sit to Stand Details (indicate cue type and reason): A needed for lifting and lowering. Pt unable to clear hps from bed without +2 A. Tried Sara Plus, but increased thoracic pain, so stopped.  Stand to Sit: 1: +2 Total assist;With upper extremity assist;To bed Ambulation/Gait Ambulation/Gait Assistance: Not tested (comment) Stairs: No Wheelchair Mobility Wheelchair Mobility: No Distance: 60  ADL:    Cognition: Cognition Overall Cognitive Status: Appears within functional limits for tasks assessed Arousal/Alertness: Awake/alert Orientation Level: Oriented X4 Attention:  Sustained Sustained Attention: Appears intact Memory: Appears intact Awareness: Appears intact Problem Solving: Appears intact Safety/Judgment: Appears intact Cognition Arousal/Alertness: Awake/alert Orientation Level: Oriented X4 Medical problem list  1.Cervical Myelopathy-S/Pcervical diskectomy with decompression 12/3.Cervical collar at all times.Decadron taper  2. DVT Prophylaxis/Anticoagulation: SCD/support hose.No current signs of DVT. Dopplers are negative.   3. Pain Management: Oxycodone/robaxin.Monitor with increased activity.    4. Neurogenic bladder and hematuria/BPH-.Foley tube in place with hematuria resolving.Continue flomax for now.  -ceftaz for pseudomonas in urine  5. Neurogenic bowel/hypogastric discomfort: -am bowel  Program  -fibercon and dulcolax  -increased florastor  -explained to patient that some of pain is likely neurologic/sensory loss.  -lacks sphincter tone which doesn't help things  -rx UTI with ceftaz  -is on gi prophylaxis  -decadron decreased to BID today.  -stopped fiber due to frequent stools.    5.Steriod induced hyperglycemia-check blood sugars ac/hs and monitor as taper steroids. Cover with SSI and rx UTI.  Sugars are improving. Decrease cbg checks  6.Depression-celexa daily.Provide emotional support.   7.Dysphagia-  SLP so no major issues.  Work on positioning, pace, technique.  Will review breathing techniques with patient also.  8.Hyperlipidemia-zocor  18  KIRSTEINS,ANDREW E 02/14/2011, 9:08 AM

## 2011-02-14 NOTE — Progress Notes (Signed)
Pt alert and oriented, pt refused to get out of bed with nursing after multiple attempts. Pt complained of his buttocks burning; cream applied slight redness noted. Pt incontinent of a large bowel movement after suppository.  Ricky Bradley

## 2011-02-15 DIAGNOSIS — G8250 Quadriplegia, unspecified: Secondary | ICD-10-CM

## 2011-02-15 DIAGNOSIS — Z5189 Encounter for other specified aftercare: Secondary | ICD-10-CM

## 2011-02-15 DIAGNOSIS — N319 Neuromuscular dysfunction of bladder, unspecified: Secondary | ICD-10-CM

## 2011-02-15 DIAGNOSIS — K592 Neurogenic bowel, not elsewhere classified: Secondary | ICD-10-CM

## 2011-02-15 DIAGNOSIS — M4712 Other spondylosis with myelopathy, cervical region: Secondary | ICD-10-CM

## 2011-02-15 LAB — GLUCOSE, CAPILLARY: Glucose-Capillary: 119 mg/dL — ABNORMAL HIGH (ref 70–99)

## 2011-02-15 NOTE — Progress Notes (Signed)
Physical Therapy Weekly Progress Note  Patient Details  Name: Ricky Bradley MRN: [id number] Date of Birth: 13-May-1933  Today's Date: 02/15/2011 Time: 1000-1100 Time Calculation (min): 60 min Session #2 13:00- 13:50 individual therapy pt denied pain practiced partial roll to elbow prop focusing on use of momentum and leg placement. Working towards circle sitting without HOB elevated and strength. X 3 max assist. Used leg loops to get legs off bed with min assist and tactile cues for hand placement and how to move UE for best results. Side to sit mod assist for trunk. beasy board transfer with mod assist. Pt watched video of a person with SCI for increased understanding of transfers and weight shifts.  Patient has met 4 of 4 short term goals.  Pt initially varied between total to max assist for sliding board or beasy board transfers, and now can consistently perform beasy board transfers with max assist.  wc mobility is consistent supervision in all environments with vc for technique and problem solving for efficiency. Pt is independently calling for position changes for pressure relief. Pt. Will likely dc to SNF due to lack of physical assistance at home.  Patient continues to demonstrate the following deficits: decreased activity tolerance, postural control, motor, strength, balance, pressure relief techniques, functional mobility and therefore will continue to benefit from skilled PT intervention to enhance overall performance with activity tolerance, balance, postural control and ability to compensate for deficits.  Patient progressing toward long term goals..  Continue plan of care.  PT Short Term Goals PT Short Term Goal 1: bed mobility mod assist  PT Short Term Goal 1 - Progress: Met PT Short Term Goal 2: sliding board transfer consistently max assist PT Short Term Goal 2 - Progress: Met PT Short Term Goal 3: wc mobiltiy supervision 150' controlled PT Short Term Goal 3 - Progress: Met PT  Short Term Goal 4: pt will call for nursing to assist with position change for pressure relief PT Short Term Goal 4 -metl  Therapy Documentation Precautions: Precautions Precautions: Fall Precaution Comments: Cervical precautions Required Braces or Orthoses: Yes Cervical Brace: Hard collar;Applied in supine position Restrictions Weight Bearing Restrictions: No  Skilled Therapeutic Interventions/Progress Updates:session #1 individual therapy 11:00- 12:00 session focused on wheelchair mobility in kitchen opening refriderator door with supervision vc needed for placement of wc and opening doors. Dynamic sitting balance from wc min assist with vc for hand placement and use of brakes.  beasy board transfer max assist with vc for technique used leg loops for leg management into bed mod assist with vc for how to use. Performed circle sitting activities with HOB elevated using leg loops mod to max assist.           Pain Pain Assessment Pain Assessment: 0-10 Pain Score:   3 Pain Type: Chronic pain Pain Location: Shoulder Pain Orientation: Upper Pain Descriptors: Burning;Aching Pain Frequency: Intermittent Pain Onset: Gradual Pain Intervention(s): Medication (See eMAR) Mobility Bed Mobility Rolling Right: 3: Mod assist Rolling Right Details: Verbal cues for technique Rolling Right Details (indicate cue type and reason): vc on how to use leg loops Rolling Left: 3: Mod assist Rolling Left Details: Verbal cues for technique Left Sidelying to Sit: 1: +1 Total assist Left Sidelying to Sit Details: Verbal cues for technique Locomotion  Ambulation Ambulation: No Gait Gait: No Stairs / Additional Locomotion Stairs: No Wheelchair Mobility Wheelchair Mobility: Yes Wheelchair Assistance: 5: Investment banker, operational: Both upper extremities Wheelchair Parts Management: Needs assistance  Trunk/Postural Assessment  Balance Static Sitting Balance Static Sitting - Balance  Support: Bilateral upper extremity supported Static Sitting - Level of Assistance: 5: Stand by assistance Static Sitting - Comment/# of Minutes: more assist when fatigued       Other Treatments    Therapy/Group: Individual Therapy  [name] 02/15/2011, 12:14 PM

## 2011-02-15 NOTE — Progress Notes (Signed)
Patient ID: Ricky Bradley, male   DOB: [birth year], 78 y.o.   MRN: [id number] Subjective:  The patient is alert and pleasant. He has no complaints.  Objective: Vital signs in last 24 hours: Temp:  [97.5 F (36.4 C)-98.3 F (36.8 C)] 97.5 F (36.4 C) (01/02 0500) Pulse Rate:  [49-80] 80  (01/02 0500) Resp:  [18-20] 18  (01/02 0500) BP: (111-139)/(67-94) 111/67 mmHg (01/02 0500) SpO2:  [94 %-97 %] 97 % (01/02 0500)  Intake/Output from previous day: 01/01 0701 - 01/02 0700 In: 600 [P.O.:600] Out: 2206 [Urine:2205; Stool:1] Intake/Output this shift: Total I/O In: 1080 [P.O.:1080] Out: 600 [Urine:600]  Physical exam patient's strength has improved since I last saw him. He has 4+ over 5 bilateral hand grip strength. The patient's incision is healing well.  Lab Results: No results found for this basename: WBC:2,HGB:2,HCT:2,PLT:2 in the last 72 hours BMET No results found for this basename: NA:2,K:2,CL:2,CO2:2,GLUCOSE:2,BUN:2,CREATININE:2,CALCIUM:2 in the last 72 hours  Studies/Results: No results found.  Assessment/Plan: The patient is making good progress.  LOS: 19 days     Jearlean Demauro D 02/15/2011, 3:51 PM

## 2011-02-15 NOTE — Progress Notes (Signed)
Patient alert and oriented x 3 - uses call bell appropriately. Patient incontinent of bowel with last bowel movement on 02/15/11. Patient had suppositiry inserted this am at 0830. Patient transferred to bedside commode this evening and had extra large bowel movement. Patient foley removed this morning at 1100. Patient unable to void with trial. Patient cathed at 1830 for 400cc. Patient Sacrum red- turn every 2 hours. Using epc on sacrum and cream for hemorrhoids. Patient has aspen collar intact. Patient given 5mg  oxy IR at 1110 for abdominal pain and body aches. Pain medication effective.  Patient up with 2+ mod assist slide board. No new complaints noted. Continue with plan of care.

## 2011-02-15 NOTE — Progress Notes (Signed)
Occupational Therapy Session Note  Patient Details  Name: Ricky Bradley MRN: [id number] Date of Birth: 1933/05/16  Today's Date: 02/15/2011 Time: 1000-1100 Time Calculation (min): 60 min  Precautions: Precautions Precautions: Fall Precaution Comments: Cervical precautions Required Braces or Orthoses: Yes Cervical Brace: Hard collar;Applied in supine position Restrictions Weight Bearing Restrictions: No   Skilled Therapeutic Interventions/Progress Updates:    ADL retraining including LB bathing and dressing supine with HOB elevated and UB bathing and dressing w/c level at sink.  Pt continues to require assistance positioning BLE in preparation for rolling side to side in bed and prior to sitting EOB in preparation for sliding board transfer to w/c.  Pt supervision sitting EOB with BUE support. Focus on bed mobility, static and dynamic sitting balance, and activity tolerance.  Pt c/o dizziness when sitting EOB but resolved within approx 1 min.     Pain Pain Assessment Pain Assessment: 0-10 Pain Score:   3 Pain Type: Chronic pain Pain Location: Shoulder Pain Orientation: Upper Pain Descriptors: Burning;Aching Pain Frequency: Intermittent Pain Onset: Gradual Pain Intervention(s): Medication (See eMAR)  Therapy/Group: Individual Therapy  [name] 02/15/2011, 12:15 PM

## 2011-02-15 NOTE — Progress Notes (Signed)
Occupational Therapy Session Note  Patient Details  Name: Ricky Bradley MRN: [id number] Date of Birth: December 04, 1933  Today's Date: 02/15/2011 Time: 8657-8469 Time Calculation (min): 30 min  Precautions: Precautions Precautions: Fall Precaution Comments: Cervical precautions Required Braces or Orthoses: Yes Cervical Brace: Hard collar;Applied in supine position Restrictions Weight Bearing Restrictions: No   Skilled Therapeutic Interventions/Progress Updates:    Focus on w/c mobility, dynamic sitting activities, FM activities to button shirt. Pt exhibited increased ability to button/unbutton shirt with additional time. Unsuccessful crossing RLE over LLE using leg loops to position RLE in preparation for donning/doffing socks.  Pt c/o tightness in upper leg.  Pt exhibited increased trunk control to reach to floor while seated in w/c.    Pain Pain Assessment Pain Assessment: 0-10 (c/o some pain in buttocks - not rated)   Therapy/Group: Individual Therapy  [name] 02/15/2011, 2:47 PM

## 2011-02-15 NOTE — Progress Notes (Signed)
Patient ID: Ricky Bradley, male   DOB: [birth year], 78 y.o.   MRN: [id number] Patient ID: Ricky Bradley, male   DOB: Jul 02, 1933, 78 y.o.   MRN: [id number] Subjective/Complaints: No problems 1/2  Review of Systems  HENT: Positive for neck pain.   Gastrointestinal:       Still feels a little bloated.  Appetite reasonable though  Musculoskeletal: Positive for back pain.  All other systems reviewed and are negative.   Objective: Vital Signs: Blood pressure 111/67, pulse 80, temperature 97.5 F (36.4 C), temperature source Oral, resp. rate 18, weight 73.6 kg (162 lb 4.1 oz), SpO2 97.00%. No results found. Results for orders placed during the hospital encounter of 01/27/11 (from the past 72 hour(s))  GLUCOSE, CAPILLARY     Status: Abnormal   Collection Time   02/12/11  7:27 AM      Component Value Range Comment   Glucose-Capillary 108 (*) 70 - 99 (mg/dL)    Comment 1 Notify RN     GLUCOSE, CAPILLARY     Status: Abnormal   Collection Time   02/12/11 11:45 AM      Component Value Range Comment   Glucose-Capillary 156 (*) 70 - 99 (mg/dL)    Comment 1 Notify RN     GLUCOSE, CAPILLARY     Status: Abnormal   Collection Time   02/12/11  4:53 PM      Component Value Range Comment   Glucose-Capillary 106 (*) 70 - 99 (mg/dL)    Comment 1 Notify RN     GLUCOSE, CAPILLARY     Status: Abnormal   Collection Time   02/12/11  8:16 PM      Component Value Range Comment   Glucose-Capillary 174 (*) 70 - 99 (mg/dL)    Comment 1 Notify RN     GLUCOSE, CAPILLARY     Status: Abnormal   Collection Time   02/13/11  7:27 AM      Component Value Range Comment   Glucose-Capillary 117 (*) 70 - 99 (mg/dL)    Comment 1 Notify RN     GLUCOSE, CAPILLARY     Status: Abnormal   Collection Time   02/13/11  4:43 PM      Component Value Range Comment   Glucose-Capillary 104 (*) 70 - 99 (mg/dL)    Comment 1 Notify RN     GLUCOSE, CAPILLARY     Status: Abnormal   Collection Time   02/14/11  6:50 AM      Component  Value Range Comment   Glucose-Capillary 112 (*) 70 - 99 (mg/dL)   GLUCOSE, CAPILLARY     Status: Abnormal   Collection Time   02/14/11 11:56 AM      Component Value Range Comment   Glucose-Capillary 115 (*) 70 - 99 (mg/dL)    Comment 1 Notify RN     GLUCOSE, CAPILLARY     Status: Abnormal   Collection Time   02/14/11  3:58 PM      Component Value Range Comment   Glucose-Capillary 148 (*) 70 - 99 (mg/dL)    Comment 1 Notify RN     GLUCOSE, CAPILLARY     Status: Abnormal   Collection Time   02/14/11  9:09 PM      Component Value Range Comment   Glucose-Capillary 185 (*) 70 - 99 (mg/dL)    Comment 1 Notify RN     GLUCOSE, CAPILLARY     Status: Abnormal   Collection Time  02/15/11  6:37 AM      Component Value Range Comment   Glucose-Capillary 119 (*) 70 - 99 (mg/dL)    Comment 1 Notify RN      Physical Exam  Constitutional: He is oriented to person, place, and time. Appears fatigued HENT:  Head: Normocephalic.  Neck:  Collar in place  Cardiovascular: Normal rate.  Pulmonary/Chest: Breath sounds normal. He has no wheezes.  Abdominal: He exhibits no distension. There is no tenderness. Bowel sounds are active. Musculoskeletal: He exhibits no edema.  Neurological: He is alert and oriented to person, place, and time. A sensory deficit is present. Coordination reduced in BUEs Patient with 4/5 strength in R upper extremity, 3/5 on Left. HI are improving and now 3-3+/5. Marland Kitchen In the lower extremities strength was tr/5 proximally at the right hip flexors 1-tr at the right knee extensors and flexors and to tr-1/5 right. Left leg 1+to3/5. R ADF/APF tr-1/5. Prox RLE 1/5.  continued leg sensation at 1 plus out of 2. Fine motor coordination of the upper extremities was fair. Cognitively he was alert and oriented x 3 with extra time, but was slow to process . CN exam unremarkable 2-12. DTR's 2+ in lower ext's and resting tone at knees and ankles of trace to tr/4.   Skin: Surgical site clean. C-collar  fitting appropriately. Erythema and mild breakdown at sacrum with EPBC and stable.  Psychiatric: pleasant and very alert.  In good spirits. 1/2 exam    Assessment/Plan: 1. Functional deficits secondary to Cervical myelopathy causing tetraplegia C7 ASIA C which require 3+ hours per day of interdisciplinary therapy in a comprehensive inpatient rehab setting. Physiatrist is providing close team supervision and 24 hour management of active medical problems listed below. Physiatrist and rehab team continue to assess barriers to discharge/monitor patient progress toward functional and medical goals.  Mobility: Bed Mobility Bed Mobility: Yes Rolling Right: 2: Max assist Rolling Right Details (indicate cue type and reason): Cues for UE use, A for bil LEs Rolling Left: 2: Max assist Rolling Left Details (indicate cue type and reason): Cues for UE use, A for bil LEs Left Sidelying to Sit: 1: +1 Total assist;HOB flat Left Sidelying to Sit Details (indicate cue type and reason): Pt able to A with bil UEs, but A needed for trunk and LEs Sitting - Scoot to Edge of Bed: 2: Max assist Sitting - Scoot to [place] of Bed Details (indicate cue type and reason): Cues for hand placement and timing, A for anterior translation at hips Sit to Supine - Right: 1: +2 Total assist;HOB flat Scooting to HOB: 1: +2 Total assist Scooting to [facility] Details (indicate cue type and reason): A to position LEs in hooklying and cues to lift hips. A for scooting Transfers Transfers: Yes Sit to Stand: 1: +2 Total assist;With upper extremity assist;From bed Sit to Stand Details (indicate cue type and reason): A needed for lifting and lowering. Pt unable to clear hps from bed without +2 A. Tried Sara Plus, but increased thoracic pain, so stopped.  Stand to Sit: 1: +2 Total assist;With upper extremity assist;To bed Ambulation/Gait Ambulation/Gait Assistance: Not tested (comment) Stairs: No Wheelchair Mobility Wheelchair Mobility:  No Distance: 60  ADL:    Cognition: Cognition Overall Cognitive Status: Appears within functional limits for tasks assessed Arousal/Alertness: Awake/alert Orientation Level: Oriented X4 Attention: Sustained Sustained Attention: Appears intact Memory: Appears intact Awareness: Appears intact Problem Solving: Appears intact Safety/Judgment: Appears intact Cognition Arousal/Alertness: Awake/alert Orientation Level: Oriented X4 Medical problem list  1.Cervical Myelopathy-S/Pcervical diskectomy with decompression 12/3.Cervical collar at all times.Decadron taper  2. DVT Prophylaxis/Anticoagulation: SCD/support hose.No current signs of DVT. Dopplers are negative.   3. Pain Management: Oxycodone/robaxin.Monitor with increased activity.    4. Neurogenic bladder and hematuria/BPH-.Foley tube in place with hematuria resolving.Continue flomax for now.  -ceftaz for pseudomonas in urine  5. Neurogenic bowel/hypogastric discomfort: -am bowel  Program  -fibercon and dulcolax  -increased florastor  -explained to patient that some of pain is likely neurologic/sensory loss.  -lacks sphincter tone which doesn't help things  -rx UTI with ceftaz x doses  -is on gi prophylaxis  -decadron decreased to BID today.  -stopped fiber due to frequent stools.  -intermittent cath/voiding trial    5.Steriod induced hyperglycemia-check blood sugars ac/hs and monitor as taper steroids. Cover with SSI and rx UTI.  Sugars are improving. Decrease cbg checks  6.Depression-celexa daily.Provide emotional support.   7.Dysphagia-  SLP so no major issues.  Work on positioning, pace, technique.  Will review breathing techniques with patient also.  8.Hyperlipidemia-zocor  19  Tiombe Tomeo T 02/15/2011, 6:56 AM

## 2011-02-16 LAB — GLUCOSE, CAPILLARY
Glucose-Capillary: 98 mg/dL (ref 70–99)
Glucose-Capillary: 160 mg/dL — ABNORMAL HIGH (ref 70–99)

## 2011-02-16 MED ORDER — DEXAMETHASONE 0.5 MG PO TABS
0.5000 mg | ORAL_TABLET | Freq: Two times a day (BID) | ORAL | Status: DC
  Administered 2011-02-16 – 2011-02-22 (×13): 0.5 mg via ORAL
  Filled 2011-02-16 (×15): qty 1

## 2011-02-16 NOTE — Patient Care Conference (Signed)
Inpatient RehabilitationTeam Conference Note Date: 02/16/2011   Time: 1:37 PM    Patient Name: Ricky Bradley      Medical Record Number: [id number]  Date of Birth: [birth year] Sex: Male         Room/Bed: 4029/4029-01 Payor Info: Payor: MEDICARE  Plan: MEDICARE PART A AND B  Product Type: *No Product type*     Admitting Diagnosis: C6 C7 STENOSIS WITH MYELOPATHY  Admit Date/Time:  01/27/2011  3:38 PM Admission Comments: No comment available   Primary Diagnosis:  Myelopathy Principal Problem: Myelopathy  Patient Active Problem List  Diagnoses Date Noted  . Myelopathy 01/27/2011  . Gross hematuria 01/16/2011  . Cord compression 01/10/2011  . Irritable bowel syndrome (IBS) 01/10/2011  . HTN (hypertension) 01/09/2011  . Dyslipidemia 01/09/2011  . BPH (benign prostatic hyperplasia) 01/09/2011  . GERD (gastroesophageal reflux disease) 01/09/2011  . Lumbar stenosis with neurogenic claudication 01/09/2011    Expected Discharge Date: Expected Discharge Date:  (SNF d/c as plan to be finalized tomorrow)  Team Members Present: Physician: Dr. Faith Rogue Case Manager Present: Melanee Spry, RN Social Worker Present: Dossie Der, LCSW PT Present: Karolee Stamps, PT OT Present: Edwin Cap, Loistine Chance, OT RN Present: Rosalio Macadamia    Current Status/Progress Goal Weekly Team Focus  Medical   voiding/cath trial in progress.  better abdominal pain control. slow neuro progress overall  bladder and bowel continence  see above   Bowel/Bladder   Foley removed- start voiding trials, Patient on bowel program with daily suppository  Patient to be able to void with min assist, Patient to have daily bowel movement with program in the mornings  Continue to monitor bowel/bladder- do scans - cath for volumes >350 if patient unable to void   Swallow/Nutrition/ Hydration             ADL's   Bathing mod A, UB dressing mod A, LB dressing total A, tot A/max A transfers  overall min A/supervision  ADL  retraining, activity tolerance   Mobility   max assist sliding board or beasy board transfers, min assist static sitting balance and max assist transitional movements, superision wc  mod assist transfer, supervision wc  transitional movements and leg loop[s   Communication             Safety/Cognition/ Behavioral Observations            Pain   Abdominal pain -patient reports bloating  To keep pain at <3 out of 10  Assess effectiveness of pain medication    Skin   Reddened buttocks  No break in skin   Use EPC to sacrum and assist patient to turn q2h in bed      *See Interdisciplinary Assessment and Plan and progress notes for long and short-term goals  Barriers to Discharge: continued neuro deficits which won't allow independent or supervision goals    Possible Resolutions to Barriers:  family ed/ 24 care at home    Discharge Planning/Teaching Needs:  Discharge probable NHP needs longer with rehab      Team Discussion: Making gains, but, not functional change.  Care is still extensive.    Revisions to Treatment Plan: none    Continued Need for Acute Rehabilitation Level of Care: The patient requires daily medical management by a physician with specialized training in physical medicine and rehabilitation for the following conditions: Daily direction of a multidisciplinary physical rehabilitation program to ensure safe treatment while eliciting the highest outcome that is of practical value to  the patient.: Yes Daily medical management of patient stability for increased activity during participation in an intensive rehabilitation regime.: Yes Daily analysis of laboratory values and/or radiology reports with any subsequent need for medication adjustment of medical intervention for : Neurological problems;Post surgical problems;Other Orlene Erm has been ongoing w/ daughter about SNF as probable d/c plan--began conversation w/ pt this week.  To sit down w/ pt & daughter tomorrow  afternoon to finalize d/c plan.   Brock Ra 02/16/2011, 1:37 PM

## 2011-02-16 NOTE — Progress Notes (Signed)
Patient states he has the urge to void.  Patient transferred up to bedside commode.  Patient was unable to void.  Bladder scanned for 679 mL.  Patient in and out cathed for .  Will continue to monitor.

## 2011-02-16 NOTE — Progress Notes (Signed)
Nurse tech noted a small amount of blood when patient was last in and out cathed.  Deatra Ina, PA notified of blood and the previous two caths at 0000 and 0300.  No new orders received.  Will continue to monitor.

## 2011-02-16 NOTE — Progress Notes (Signed)
Pt alert and oriented,  Pt transferred to bedside commode for voiding trial, pt unable to void.  Bladder scan at 1000 600, cath-800cc,  Pt had large bowel movement after suppository on bedside commode.  Pt complained of inability to urinate bladder scan 100cc cathed for 75cc after pt was very uncomfortable.  Pt was out of bed several times today Ricky Bradley

## 2011-02-16 NOTE — Progress Notes (Signed)
Occupational Therapy Note  Patient Details  Name: MASASHI SNOWDON MRN: [id number] Date of Birth: 1933-03-24 Today's Date: 02/16/2011  1st Session Time:1015-1115 Pain: 6 in lower back, RN aware Individual Therapy  Pt in w/c when therapist arrived in roon.  Pt completed UB bathing and dressing seated in w/c at sink and transferred to bed using Beasy Board with total assist to complete LB bathing supine with HOB elevated.  LB dressing not completed at this time.  Pt remained in bed.  Pt able to button shirt this morning and was min A for UB dressing seated in w/c.  Focus on dynamic sitting balance, activity tolerance, and bed mobility.  2nd Session Time:1300-1330 Pain: pt c/o discomfort in right shoulder with movement Individual Therapy  Pt engaged in bed mobility to assist therapist with LB dressing activities supine with HOB elevated.  Pt continues to require assistance with positioning LE prior to rolling in bed.  Pt is max A for supine to sit with min verbal cues for UE positioning.  Beasy Board transfer to w/c with total A and min verbal cues for UE positioning. Pt min A for static sitting balance EOB.  [name] [facility] 02/16/2011, 2:50 PM

## 2011-02-16 NOTE — Progress Notes (Signed)
Per nurse tech report, patient was complaining of a lot of pressure/pain in his bladder.  Patient attempted to void and was unable.  Patient was bladder scanned for .  Patient was in and out cathed for 550 mL.  Will continue to monitor.

## 2011-02-16 NOTE — Progress Notes (Signed)
Physical Therapy Session Note  Patient Details  Name: Ricky Bradley MRN: [id number] Date of Birth: [birth year]  Today's Date: 02/16/2011 Time: 4098-1191 Time Calculation (min): 45 min  Precautions: Precautions Precautions: Fall Precaution Comments: Cervical precautions Required Braces or Orthoses: Yes Cervical Brace: Hard collar;Applied in supine position Restrictions Weight Bearing Restrictions: No  Short Term Goals: PT Short Term Goal 1: bed mobility mod assist  PT Short Term Goal 1 - Progress: Met PT Short Term Goal 2: sliding board transfer consistently max assist PT Short Term Goal 2 - Progress: Met PT Short Term Goal 3: wc mobiltiy supervision 150' controlled PT Short Term Goal 3 - Progress: Met PT Short Term Goal 4: pt will call for nursing to assist with position change for pressure relief PT Short Term Goal 4 - Progress: Progressing toward goal PT Short Term Goal 5: WC mobility Mod A x 50' PT Short Term Goal 5 - Progress: Progressing toward goal  Pain Pain Assessment Pain Assessment: Faces Pain Score:   6 Faces Pain Scale: Hurts even more Pain Type: Acute pain Pain Location: Shoulder Pain Orientation: Right Pain Descriptors: Aching Pain Onset: On-going Patients Stated Pain Goal: 0 Pain Intervention(s): Rest  Locomotion  Wheelchair Mobility Distance: 150 supervision  Other Treatments Treatments Therapeutic Activity: Patient reports that he will be discharging to a SNF for more prolonged therapy; discussed importance of practicing slideboard transfer sequence and having patient be able to verbalize safety and sequence of set up and sequence of transfer to be able to direct caregivers at SNF to assist with transfers.  W/C mobility supervision.  Patient able to verbalize and demonstrate w/c set up with mod to max verbal and visual cues and assistance to remove leg rests and max A to scoot hips to front of chair to avoid hitting buttocks on wheel; reviewed correct  placement of board under thigh; slide board w/c <> mat with max A with use of head/hips relationship and patient pushing with UE; once on mat allowed patient time to rest and recline back secondary to shoulder pain and dizziness.  During rest discussed again the importance of calling for assistance at new facility for pressure relief once every 30 minutes to prevent pressure ulcers during prolonged sitting.  Slideboard w/c > bed and use of leg loops to bring LE up into bed with max A.  Patient positioned on his side for pressure relief.    Therapy/Group: Individual Therapy  Edman Circle [facility] 02/16/2011, 4:21 PM

## 2011-02-16 NOTE — Progress Notes (Signed)
Physical Therapy Note  Patient Details  Name: Ricky Bradley MRN: [id number] Date of Birth: 12/09/1933 Today's Date: 02/16/2011  Individual therapy 1330-1430 ( missed 20 minutes during this time due to pt complaining of pain in bladder and feeling that he needed catheterization. Notified RN, pt scanned and cathed with minimal results. Pt reported a little relief but still complaint of pain, RN aware). Encouraged to participate in therapy and work through some of the pain. Bed mobility with leg loops and HOB elevated with minA, repeated pushing up into sitting x 3 reps for technique. Overall maxA for slideboard transfer from bed -> w/c, pt able to instruct therapist in positioning of board and set up for transfer. W/c mobility on unit and on carpet for general activity tolerance and upper extremity strengthening  > 150' with supervision. Encouraged pt to try to sit up for dinner, pt in agreement.   Karolee Stamps [facility] 02/16/2011, 4:47 PM

## 2011-02-16 NOTE — Progress Notes (Addendum)
Patient ID: Ricky Bradley, male   DOB: November 09, 1933, 78 y.o.   MRN: [id number] Patient ID: Ricky Bradley, male   DOB: 1933-12-25, 78 y.o.   MRN: [id number] Patient ID: Ricky Bradley, male   DOB: [birth year], 78 y.o.   MRN: [id number] Subjective/Complaints: Has urge to void but has required caths.  Some blood with cathing also. 1/3  Review of Systems  HENT: Positive for neck pain.   Musculoskeletal: Positive for back pain.  All other systems reviewed and are negative.   Objective: Vital Signs: Blood pressure 110/73, pulse 76, temperature 97.5 F (36.4 C), temperature source Oral, resp. rate 21, weight 73.6 kg (162 lb 4.1 oz), SpO2 98.00%. No results found. Results for orders placed during the hospital encounter of 01/27/11 (from the past 72 hour(s))  GLUCOSE, CAPILLARY     Status: Abnormal   Collection Time   02/13/11  7:27 AM      Component Value Range Comment   Glucose-Capillary 117 (*) 70 - 99 (mg/dL)    Comment 1 Notify RN     GLUCOSE, CAPILLARY     Status: Abnormal   Collection Time   02/13/11  4:43 PM      Component Value Range Comment   Glucose-Capillary 104 (*) 70 - 99 (mg/dL)    Comment 1 Notify RN     GLUCOSE, CAPILLARY     Status: Abnormal   Collection Time   02/14/11  6:50 AM      Component Value Range Comment   Glucose-Capillary 112 (*) 70 - 99 (mg/dL)   GLUCOSE, CAPILLARY     Status: Abnormal   Collection Time   02/14/11 11:56 AM      Component Value Range Comment   Glucose-Capillary 115 (*) 70 - 99 (mg/dL)    Comment 1 Notify RN     GLUCOSE, CAPILLARY     Status: Abnormal   Collection Time   02/14/11  3:58 PM      Component Value Range Comment   Glucose-Capillary 148 (*) 70 - 99 (mg/dL)    Comment 1 Notify RN     GLUCOSE, CAPILLARY     Status: Abnormal   Collection Time   02/14/11  9:09 PM      Component Value Range Comment   Glucose-Capillary 185 (*) 70 - 99 (mg/dL)    Comment 1 Notify RN     GLUCOSE, CAPILLARY     Status: Abnormal   Collection Time   02/15/11  6:37 AM       Component Value Range Comment   Glucose-Capillary 119 (*) 70 - 99 (mg/dL)    Comment 1 Notify RN     GLUCOSE, CAPILLARY     Status: Abnormal   Collection Time   02/15/11 11:42 AM      Component Value Range Comment   Glucose-Capillary 106 (*) 70 - 99 (mg/dL)    Comment 1 Notify RN     GLUCOSE, CAPILLARY     Status: Abnormal   Collection Time   02/15/11  4:29 PM      Component Value Range Comment   Glucose-Capillary 126 (*) 70 - 99 (mg/dL)    Comment 1 Notify RN     GLUCOSE, CAPILLARY     Status: Abnormal   Collection Time   02/15/11  8:28 PM      Component Value Range Comment   Glucose-Capillary 162 (*) 70 - 99 (mg/dL)   GLUCOSE, CAPILLARY     Status: Abnormal   Collection  Time   02/16/11  6:15 AM      Component Value Range Comment   Glucose-Capillary 107 (*) 70 - 99 (mg/dL)    Physical Exam  Constitutional: He is oriented to person, place, and time. Appears fatigued HENT:  Head: Normocephalic.  Neck:  Collar in place  Cardiovascular: Normal rate.  Pulmonary/Chest: Breath sounds normal. He has no wheezes.  Abdominal: He exhibits no distension. There is no tenderness. Bowel sounds are active. Musculoskeletal: He exhibits no edema.  Neurological: He is alert and oriented to person, place, and time. A sensory deficit is present. Coordination reduced in BUEs Patient with 4/5 strength in R upper extremity, 3/5 on Left. HI are improving and now 3-3+/5. Marland Kitchen In the lower extremities strength was tr/5 proximally at the right hip flexors 1-tr at the right knee extensors and flexors and to tr-1/5 right. Left leg 1+to3/5. R ADF/APF tr-1/5. Prox RLE 1/5.  continued leg sensation at 1 plus out of 2. Fine motor coordination of the upper extremities was fair. Cognitively he was alert and oriented x 3 with extra time, but was slow to process . CN exam unremarkable 2-12. DTR's 2+ in lower ext's and resting tone at knees and ankles of trace to tr/4.   Skin: Surgical site clean. C-collar fitting  appropriately. Erythema and mild breakdown at sacrum with EPBC and stable.  Psychiatric: pleasant and very alert.  In good spirits. 1/3 exam    Assessment/Plan: 1. Functional deficits secondary to Cervical myelopathy causing tetraplegia C7 ASIA C which require 3+ hours per day of interdisciplinary therapy in a comprehensive inpatient rehab setting. Physiatrist is providing close team supervision and 24 hour management of active medical problems listed below. Physiatrist and rehab team continue to assess barriers to discharge/monitor patient progress toward functional and medical goals.  Mobility: Bed Mobility Bed Mobility: Yes Rolling Right: 3: Mod assist Rolling Right Details (indicate cue type and reason): vc on how to use leg loops Rolling Left: 3: Mod assist Rolling Left Details (indicate cue type and reason): Cues for UE use, A for bil LEs Left Sidelying to Sit: 1: +1 Total assist Left Sidelying to Sit Details (indicate cue type and reason): Pt able to A with bil UEs, but A needed for trunk and LEs Sitting - Scoot to Edge of Bed: 2: Max assist Sitting - Scoot to [employer] of Bed Details (indicate cue type and reason): Cues for hand placement and timing, A for anterior translation at hips Sit to Supine - Right: 1: +2 Total assist;HOB flat Scooting to HOB: 1: +2 Total assist Scooting to [facility] Details (indicate cue type and reason): A to position LEs in hooklying and cues to lift hips. A for scooting Transfers Transfers: Yes Sit to Stand: 1: +2 Total assist;With upper extremity assist;From bed Sit to Stand Details (indicate cue type and reason): A needed for lifting and lowering. Pt unable to clear hps from bed without +2 A. Tried Sara Plus, but increased thoracic pain, so stopped.  Stand to Sit: 1: +2 Total assist;With upper extremity assist;To bed Ambulation/Gait Ambulation/Gait Assistance: Not tested (comment) Stairs: No Wheelchair Mobility Wheelchair Mobility: Yes Wheelchair  Assistance: 5: Investment banker, operational: Both upper extremities Wheelchair Parts Management: Needs assistance Distance: 60  ADL:    Cognition: Cognition Overall Cognitive Status: Appears within functional limits for tasks assessed Arousal/Alertness: Awake/alert Orientation Level: Oriented X4 Attention: Sustained Sustained Attention: Appears intact Memory: Appears intact Awareness: Appears intact Problem Solving: Appears intact Safety/Judgment: Appears intact Cognition Arousal/Alertness: Awake/alert Orientation  Level: Oriented X4 Medical problem list  1.Cervical Myelopathy-S/Pcervical diskectomy with decompression 12/3.Cervical collar at all times.Decadron taper  2. DVT Prophylaxis/Anticoagulation: SCD/support hose.No current signs of DVT. Dopplers are negative.   3. Pain Management: Oxycodone/robaxin.Monitor with increased activity.    4. Neurogenic bladder and hematuria/BPH-.Foley tube in place with hematuria resolving.Continue flomax for now.  -ceftaz for pseudomonas in urine  5. Neurogenic bowel/hypogastric discomfort: -am bowel  Program  -fibercon and dulcolax  -increased florastor  -explained to patient that some of pain is likely neurologic/sensory loss.  -lacks sphincter tone which doesn't help things  -is on gi prophylaxis  -decadron decreased to BID today.  -stopped fiber due to frequent stools.  -intermittent cath/voiding trial--use coude cath and increase flomax to bid bp permitting  -if he has persistent or increased bleeding will probably have to replace foley.    5.Steriod induced hyperglycemia-check blood sugars ac/hs and monitor as taper steroids. Cover with SSI and rx UTI.  Sugars are improving. Decrease cbg checks  6.Depression-celexa daily.Provide emotional support.   7.Dysphagia-  SLP so no major issues.  Work on positioning, pace, technique.  Will review breathing techniques with patient also.  8.Hyperlipidemia-zocor   20  SWARTZ,ZACHARY T 02/16/2011, 6:53 AM

## 2011-02-16 NOTE — Progress Notes (Signed)
Per State Regulation 482.30 This chart was reviewed for medical necessity with respect to the patient's Admission/Duration of stay. Foley d/c'd for voiding trial.  So far pt has felt pressure, but, has been unable to void & required I&O caths.  Work continues on Environmental consultant.  Pt is participating w/ therapies.  He is discouraged that he is not progressing faster.  Meeting scheduled for tomorrow afternoon w/ pt & daughter to finalize d/c plan [SNF vs d/c home].   Brock Ra                 Nurse Care Manager              Next Review Date: 02/21/11

## 2011-02-16 NOTE — Progress Notes (Signed)
Patient ID: Ricky Bradley, male   DOB: Sep 23, 1933, 78 y.o.   MRN: [id number] Subjective:  The patient is alert and pleasant. [name] called me yesterday and was concerned about his lower extremity weakness.  Objective: Vital signs in last 24 hours: Temp:  [97.5 F (36.4 C)-99.1 F (37.3 C)] 97.5 F (36.4 C) (01/03 0543) Pulse Rate:  [68-76] 76  (01/03 0543) Resp:  [18-21] 21  (01/03 0543) BP: (94-110)/(64-73) 110/73 mmHg (01/03 0543) SpO2:  [93 %-98 %] 98 % (01/03 0543)  Intake/Output from previous day: 01/02 0701 - 01/03 0700 In: 1700 [P.O.:1700] Out: 2100 [Urine:2100] Intake/Output this shift: Total I/O In: 840 [P.O.:840] Out: 2272 [Urine:2272]  Physical exam the patient is alert and oriented. His upper extremity motor strength is 4+ over 5. He has 4/5 bilateral quadriceps and gastrocnemius strength.  Lab Results: No results found for this basename: WBC:2,HGB:2,HCT:2,PLT:2 in the last 72 hours BMET No results found for this basename: NA:2,K:2,CL:2,CO2:2,GLUCOSE:2,BUN:2,CREATININE:2,CALCIUM:2 in the last 72 hours  Studies/Results: No results found.  Assessment/Plan: Cervical myelopathy/central cord syndrome/Quadraparesis: I discussed the situation with the patient as well as his prognosis for recovery. I think he will slowly improve with time and therapy.  LOS: 20 days     Ricky Bradley 02/16/2011, 2:41 PM

## 2011-02-17 DIAGNOSIS — M4712 Other spondylosis with myelopathy, cervical region: Secondary | ICD-10-CM

## 2011-02-17 DIAGNOSIS — G8250 Quadriplegia, unspecified: Secondary | ICD-10-CM

## 2011-02-17 DIAGNOSIS — N319 Neuromuscular dysfunction of bladder, unspecified: Secondary | ICD-10-CM

## 2011-02-17 DIAGNOSIS — Z5189 Encounter for other specified aftercare: Secondary | ICD-10-CM

## 2011-02-17 DIAGNOSIS — K592 Neurogenic bowel, not elsewhere classified: Secondary | ICD-10-CM

## 2011-02-17 LAB — GLUCOSE, CAPILLARY: Glucose-Capillary: 101 mg/dL — ABNORMAL HIGH (ref 70–99)

## 2011-02-17 NOTE — Progress Notes (Signed)
Occupational Therapy Note  Patient Details  Name: Ricky Bradley MRN: [id number] Date of Birth: Mar 05, 1933 Today's Date: 02/17/2011  Pt missed 30 mins skilled OT svcs secondary to multiple bowel movements.  Stool sample sent to Lab for possible CDiff.    [name] [facility] 02/17/2011, 1:20 PM

## 2011-02-17 NOTE — Progress Notes (Signed)
Nursing Note: Pt complained of feeling uncomfortable /abd/felt need to void but unable to A: I and O cathed for 425 cc.wbb

## 2011-02-17 NOTE — Progress Notes (Signed)
Physical Therapy Session Note  Patient Details  Name: Ricky Bradley MRN: [id number] Date of Birth: 14-Sep-1933  Today's Date: 02/17/2011 Time: 1105-1205 Time Calculation (min): 60 min  Precautions: Precautions Precautions: Fall Precaution Comments: Cervical precautions Required Braces or Orthoses: Yes Cervical Brace: Hard collar;Applied in supine position Restrictions Weight Bearing Restrictions: No  Short Term Goals: PT Short Term Goal 1: bed mobility mod assist  PT Short Term Goal 1 - Progress: Met PT Short Term Goal 2: sliding board transfer consistently max assist PT Short Term Goal 2 - Progress: Met PT Short Term Goal 3: wc mobiltiy supervision 150' controlled PT Short Term Goal 3 - Progress: Met PT Short Term Goal 4: pt will call for nursing to assist with position change for pressure relief PT Short Term Goal 4 - Progress: Progressing toward goal PT Short Term Goal 5: WC mobility Mod A x 50' PT Short Term Goal 5 - Progress: Progressing toward goal  Skilled Therapeutic Interventions/Progress Updates: Pt very uncomfortable with abdominal pain this treatment; he had BM this AM after suppository.  W/c mobility x 100' before fatiguing in UEs; after rest propelled 20' more, with supervision.  W/c> mat level SBT with mod VCs for pt to initiate directions to caregiver; VCs for brakes; min A for armrest management; total A for legrest management after pt removed his feet from footrests. Max> total A for SBT to L or R, for wt shifting and bringing hips forward, placing board, moving hips across board.  Static sitting balance on EOM with close S; mod A while pt drank from a straw after LOB backward.  Sit>< supine with mod A for LE and trunk management, using legstraps with VCs.  In supine, pt used legstraps with min A to flex LEs up as a component of circle sitting.  Pt reported that he had had a bowel accident.  W/C> bed SBT as above.  Rolling in flat bed using rails with min A after set-up of  LEs in flexion. Pt's skin over sacrum is very red, burning.  RN observed, and is aware.     General   Vital Signs Therapy Vitals Pulse Rate: 116  (with exertion) BP: 131/87 mmHg (with exertion) Patient Position, if appropriate: Sitting Oxygen Therapy O2 Device: None (Room air) FiO2 (%): 99 % (dyspnea 3/4 throughout session) Pain Pain Assessment Pain Assessment: 0-10 Pain Score:   6 (abdominal; premedicated) Pain Intervention(s): RN made aware         Therapy/Group: Individual Therapy  Mort Smelser 02/17/2011, 12:21 PM

## 2011-02-17 NOTE — Progress Notes (Signed)
Nursing Note: no void in 6 hrs A: scanned for 292 cc and cathed by Arlene,nt for 400 cc.Pt tolerated well.wbb

## 2011-02-17 NOTE — Progress Notes (Signed)
Patient ID: Ricky Bradley, male   DOB: [birth year], 78 y.o.   MRN: [id number] Patient ID: Ricky Bradley, male   DOB: 1933-02-27, 78 y.o.   MRN: [id number] Patient ID: Ricky Bradley, male   DOB: 1933/10/09, 78 y.o.   MRN: [id number] Patient ID: Ricky Bradley, male   DOB: 1933/03/29, 78 y.o.   MRN: [id number] Subjective/Complaints: Has urge to void but has required caths.  Tolerated caths a little better yesterday 1/4  Review of Systems  HENT: Positive for neck pain.   Musculoskeletal: Positive for back pain.  All other systems reviewed and are negative.   Objective: Vital Signs: Blood pressure 96/59, pulse 76, temperature 97.6 F (36.4 C), temperature source Oral, resp. rate 20, weight 73.5 kg (162 lb 0.6 oz), SpO2 95.00%. No results found. Results for orders placed during the hospital encounter of 01/27/11 (from the past 72 hour(s))  GLUCOSE, CAPILLARY     Status: Abnormal   Collection Time   02/14/11 11:56 AM      Component Value Range Comment   Glucose-Capillary 115 (*) 70 - 99 (mg/dL)    Comment 1 Notify RN     GLUCOSE, CAPILLARY     Status: Abnormal   Collection Time   02/14/11  3:58 PM      Component Value Range Comment   Glucose-Capillary 148 (*) 70 - 99 (mg/dL)    Comment 1 Notify RN     GLUCOSE, CAPILLARY     Status: Abnormal   Collection Time   02/14/11  9:09 PM      Component Value Range Comment   Glucose-Capillary 185 (*) 70 - 99 (mg/dL)    Comment 1 Notify RN     GLUCOSE, CAPILLARY     Status: Abnormal   Collection Time   02/15/11  6:37 AM      Component Value Range Comment   Glucose-Capillary 119 (*) 70 - 99 (mg/dL)    Comment 1 Notify RN     GLUCOSE, CAPILLARY     Status: Abnormal   Collection Time   02/15/11 11:42 AM      Component Value Range Comment   Glucose-Capillary 106 (*) 70 - 99 (mg/dL)    Comment 1 Notify RN     GLUCOSE, CAPILLARY     Status: Abnormal   Collection Time   02/15/11  4:29 PM      Component Value Range Comment   Glucose-Capillary 126 (*) 70 - 99  (mg/dL)    Comment 1 Notify RN     GLUCOSE, CAPILLARY     Status: Abnormal   Collection Time   02/15/11  8:28 PM      Component Value Range Comment   Glucose-Capillary 162 (*) 70 - 99 (mg/dL)   GLUCOSE, CAPILLARY     Status: Abnormal   Collection Time   02/16/11  6:15 AM      Component Value Range Comment   Glucose-Capillary 107 (*) 70 - 99 (mg/dL)   GLUCOSE, CAPILLARY     Status: Normal   Collection Time   02/16/11  7:08 AM      Component Value Range Comment   Glucose-Capillary 98  70 - 99 (mg/dL)    Comment 1 Notify RN     GLUCOSE, CAPILLARY     Status: Abnormal   Collection Time   02/16/11  4:51 PM      Component Value Range Comment   Glucose-Capillary 110 (*) 70 - 99 (mg/dL)    Comment 1  Notify RN     GLUCOSE, CAPILLARY     Status: Abnormal   Collection Time   02/16/11  9:13 PM      Component Value Range Comment   Glucose-Capillary 160 (*) 70 - 99 (mg/dL)    Comment 1 Notify RN     GLUCOSE, CAPILLARY     Status: Abnormal   Collection Time   02/17/11  6:22 AM      Component Value Range Comment   Glucose-Capillary 107 (*) 70 - 99 (mg/dL)    Comment 1 Notify RN      Physical Exam  Constitutional: He is oriented to person, place, and time. Appears fatigued HENT:  Head: Normocephalic.  Neck:  Collar in place  Cardiovascular: Normal rate.  Pulmonary/Chest: Breath sounds normal. He has no wheezes.  Abdominal: He exhibits no distension. There is no tenderness. Bowel sounds are active. Musculoskeletal: He exhibits no edema.  Neurological: He is alert and oriented to person, place, and time. A sensory deficit is present. Coordination reduced in BUEs Patient with 4/5 strength in R upper extremity, 3/5 on Left. HI are improving and now 3-3+/5. Marland Kitchen In the lower extremities strength was tr/5 proximally at the right hip flexors 1-tr at the right knee extensors and flexors and to tr-1/5 right. Left leg 1+to3/5. R ADF/APF tr-1/5. Prox RLE 1/5.  continued leg sensation at 1 plus out of 2. Fine  motor coordination of the upper extremities was fair. Cognitively he was alert and oriented x 3 with extra time, but was slow to process . CN exam unremarkable 2-12. DTR's 2+ in lower ext's and resting tone at knees and ankles of trace to tr/4.   Skin: Surgical site clean. C-collar fitting appropriately. Erythema and mild breakdown at sacrum with EPBC and stable.  Psychiatric: pleasant and very alert.  In good spirits. 1/4 exam    Assessment/Plan: 1. Functional deficits secondary to Cervical myelopathy causing tetraplegia C7 ASIA C which require 3+ hours per day of interdisciplinary therapy in a comprehensive inpatient rehab setting. Physiatrist is providing close team supervision and 24 hour management of active medical problems listed below. Physiatrist and rehab team continue to assess barriers to discharge/monitor patient progress toward functional and medical goals.  Mobility: Bed Mobility Bed Mobility: Yes Rolling Right: 3: Mod assist Rolling Right Details (indicate cue type and reason): vc on how to use leg loops Rolling Left: 3: Mod assist Rolling Left Details (indicate cue type and reason): Cues for UE use, A for bil LEs Left Sidelying to Sit: 1: +1 Total assist Left Sidelying to Sit Details (indicate cue type and reason): Pt able to A with bil UEs, but A needed for trunk and LEs Sitting - Scoot to Edge of Bed: 2: Max assist Sitting - Scoot to [employer] of Bed Details (indicate cue type and reason): Cues for hand placement and timing, A for anterior translation at hips Sit to Supine - Right: 1: +2 Total assist;HOB flat Scooting to HOB: 1: +2 Total assist Scooting to [facility] Details (indicate cue type and reason): A to position LEs in hooklying and cues to lift hips. A for scooting Transfers Transfers: Yes Sit to Stand: 1: +2 Total assist;With upper extremity assist;From bed Sit to Stand Details (indicate cue type and reason): A needed for lifting and lowering. Pt unable to clear hps from  bed without +2 A. Tried Sara Plus, but increased thoracic pain, so stopped.  Stand to Sit: 1: +2 Total assist;With upper extremity assist;To bed Ambulation/Gait Ambulation/Gait  Assistance: Not tested (comment) Stairs: No Corporate treasurer: Yes Wheelchair Assistance: 5: Investment banker, operational: Both upper extremities Wheelchair Parts Management: Needs assistance Distance: 150  ADL:    Cognition: Cognition Overall Cognitive Status: Appears within functional limits for tasks assessed Arousal/Alertness: Awake/alert Orientation Level: Oriented X4 Attention: Sustained Sustained Attention: Appears intact Memory: Appears intact Awareness: Appears intact Problem Solving: Appears intact Safety/Judgment: Appears intact Cognition Arousal/Alertness: Awake/alert Orientation Level: Oriented X4 Medical problem list  1.Cervical Myelopathy-S/Pcervical diskectomy with decompression 12/3.Cervical collar at all times.Decadron taper  2. DVT Prophylaxis/Anticoagulation: SCD/support hose.No current signs of DVT. Dopplers are negative.   3. Pain Management: Oxycodone/robaxin.Monitor with increased activity.    4. Neurogenic bladder and hematuria/BPH-.Foley tube in place with hematuria resolving.Continue flomax for now.  -ceftaz for pseudomonas in urine  5. Neurogenic bowel/bladdert: -am bowel  Program  -fibercon and dulcolax  -increased florastor  -explained to patient that some of pain is likely neurologic/sensory loss.  -lacks sphincter tone which doesn't help things  -is on gi prophylaxis  -decadron decreased to 0.5mg  bid  -stopped fiber due to frequent stools.  -intermittent cath/voiding trial--use coude cath and increase flomax to bid bp permitting. It's probably a overly optimistic to expect to him cath himself given his HI use and c-collar but i would like to give it a try at least.  -if he has andy urethral bleeding will probably have to replace  foley.    5.Steriod induced hyperglycemia-check blood sugars ac/hs and monitor as taper steroids. Cover with SSI and rx UTI.  Sugars are improving. Decrease cbg checks  6.Depression-celexa daily.Provide emotional support.   7.Dysphagia-  SLP so no major issues.  Work on positioning, pace, technique.  Will review breathing techniques with patient also.  8.Hyperlipidemia-zocor  21  Daire Okimoto T 02/17/2011, 7:39 AM

## 2011-02-17 NOTE — Progress Notes (Signed)
Physical Therapy Note  Patient Details  Name: Ricky Bradley MRN: [id number] Date of Birth: [birth year] Today's Date: 02/17/2011  Patient in bed c/o significant fatigue and shoulder pain.  Patient just had 4th BM; assisted nurse tech with rolling patient in bed for hygiene and brief change; repositioned patient on R side for pressure relief.  Patient also having increased difficultly forming sentences and recalling information.  Discussed patient's confusion with RN who states he was given Phenergran for his bowels and is likely very groggy from that medication.  Patient requested to remain in bed and rest secondary to pain and fatigue.      Edman Circle Faucette 02/17/2011, 2:30 PM

## 2011-02-17 NOTE — Progress Notes (Signed)
Occupational Therapy Session Note  Patient Details  Name: AMAD MAU MRN: [id number] Date of Birth: 08-01-1933  Today's Date: 02/17/2011 Time: 1000-1100 Time Calculation (min): 60 min  Precautions: Precautions Precautions: Fall Precaution Comments: Cervical precautions Required Braces or Orthoses: Yes Cervical Brace: Hard collar;Applied in supine position Restrictions Weight Bearing Restrictions: No   Skilled Therapeutic Interventions/Progress Updates:    ADL retraining with focus on drop arm BSC transfers with sliding board, toileting, bathing and dressing tasks.  See FIM for assist levels.  Discussion with patient regarding need for him to direct his care with caregivers.  Additional focus on bed mobility and LE positioning using leg loops.  Pt able to bring BLE over edge of bed using leg loops when supine in bed.  Pt continues to require max A for supine to sit EOB with min verbal cues for BUE positioning.  Pt completed UB bathing and dressing tasks seated in w/c at sink.  Pt able to bring shirt around back this morning but required assistance buttoning shirt.  Pt exhibited SOB several times throughout session but able to continue with activities.  Pain Pain Assessment Pain Assessment: 0-10 Pain Score:   6 Pain Type: Acute pain Pain Location: Back Pain Orientation: Right;Lower Pain Descriptors: Aching Patients Stated Pain Goal: 2 Pain Intervention(s): RN made aware      Therapy/Group: Individual Therapy  [name] 02/17/2011, 11:31 AM

## 2011-02-17 NOTE — Progress Notes (Signed)
Slide board transfer 2+ mod assist. Pt has poor body control to maintain upright posture. Aspen collar on at all times. Pt c/o severe bowel cramping with multiple bowel movements through out day. See I & O flow sheet. Buttocks/coccyx area becoming red due to frequent BMs. Protective cream liberally applied with each cleaning. Poor appetite today due to GI disrupt. Tested for C Diff with negative results. Isolation began and ended for enteric contact. Administered phenergan per Jesusita Oka, PA suggestion to ease cramping. Pt states cramping has lessened. Poor participation with therapy today due to GI disturbance. Continue plan of care.

## 2011-02-18 LAB — GLUCOSE, CAPILLARY: Glucose-Capillary: 105 mg/dL — ABNORMAL HIGH (ref 70–99)

## 2011-02-18 MED ORDER — SILVER SULFADIAZINE 1 % EX CREA: TOPICAL_CREAM | Freq: Two times a day (BID) | CUTANEOUS | Status: DC

## 2011-02-18 NOTE — Progress Notes (Signed)
Occupational Therapy Session Note  Patient Details  Name: Ricky Bradley MRN: [id number] Date of Birth: 1933-05-09  Today's Date: 02/18/2011 Time: 0805-0900 Time Calculation (min): 55 min  Precautions: Precautions Precautions: Fall Precaution Comments: Cervical precautions Required Braces or Orthoses: Yes Cervical Brace: Hard collar;Applied in supine position Restrictions Weight Bearing Restrictions: No  Short Term Goals: OT Short Term Goal 1: Pt. will be minimal assist with feeding self OT Short Term Goal 1 - Progress: Met OT Short Term Goal 2: Pt. will be be set up with UB bathing OT Short Term Goal 2 - Progress: Met OT Short Term Goal 3: Pt.  will verbalize strategies for pressur relief with minimal cues OT Short Term Goal 3 - Progress: Met OT Short Term Goal 4: Pt.  will sit up for 2 hours daily in chair OT Short Term Goal 4 - Progress: Met  Skilled Therapeutic Interventions/Progress Updates: self care retraining focus on bed mobility: including rolling side to side with increase lateral weight shift at hips, half circle sit with HOB rasised/ crossing legs, for bathing and attempting to thread pants, mod a to get to EOB, demonstrating increased ability to push from side lying to sitting, dizzy when came into sitting, able to sit with UE support with supervision, total A of one slide board transfer to w/c for UB bathing and dressing.  Tried a new way to don button up shirt: keeping the bottom of shirt buttoned and donned over head- then pt was able to button one button. Min A to lean forward for him to pull down shirt over trunk     Pain  no c/o pain  Therapy/Group: Individual Therapy  Melonie Florida 02/18/2011, 9:11 AM

## 2011-02-18 NOTE — Progress Notes (Addendum)
Refused suppository today, per patient he had several bowel movements yesterday and early this morning. Last bowel movement today. Scanned for 150 ml at 9.a.m.,coude  cath for 300 ml.transfer with slideboard, two helpers. Aspen collar in place. Tolerating therapies well. Scanned for >200, coude cath for 850 ml at 1600. Patient had athe urge to void but unable to.

## 2011-02-18 NOTE — Progress Notes (Signed)
Patient ID: Ricky Bradley, male   DOB: 1933/09/22, 78 y.o.   MRN: [id number] Patient ID: Ricky Bradley, male   DOB: [birth year], 78 y.o.   MRN: [id number] Patient ID: Ricky Bradley, male   DOB: [birth year], 78 y.o.   MRN: [id number] Patient ID: Ricky Bradley, male   DOB: March 29, 1933, 78 y.o.   MRN: [id number] Patient ID: Ricky Bradley, male   DOB: [birth year], 78 y.o.   MRN: [id number] Subjective/Complaints: Has urge to void but has required caths.  Tolerated caths a little better yesterday 1/4 He had diarrhea all day yesturday - no BMs this am; he was eating breakfast and had no complaints  Review of Systems  HENT: Positive for neck pain.   Musculoskeletal: Positive for back pain.  All other systems reviewed and are negative.   Objective: Vital Signs: Blood pressure 95/61, pulse 77, temperature 98 F (36.7 C), temperature source Oral, resp. rate 19, weight 162 lb 0.6 oz (73.5 kg), SpO2 93.00%. No results found. Results for orders placed during the hospital encounter of 01/27/11 (from the past 72 hour(s))  GLUCOSE, CAPILLARY     Status: Abnormal   Collection Time   02/15/11 11:42 AM      Component Value Range Comment   Glucose-Capillary 106 (*) 70 - 99 (mg/dL)    Comment 1 Notify RN     GLUCOSE, CAPILLARY     Status: Abnormal   Collection Time   02/15/11  4:29 PM      Component Value Range Comment   Glucose-Capillary 126 (*) 70 - 99 (mg/dL)    Comment 1 Notify RN     GLUCOSE, CAPILLARY     Status: Abnormal   Collection Time   02/15/11  8:28 PM      Component Value Range Comment   Glucose-Capillary 162 (*) 70 - 99 (mg/dL)   GLUCOSE, CAPILLARY     Status: Abnormal   Collection Time   02/16/11  6:15 AM      Component Value Range Comment   Glucose-Capillary 107 (*) 70 - 99 (mg/dL)   GLUCOSE, CAPILLARY     Status: Normal   Collection Time   02/16/11  7:08 AM      Component Value Range Comment   Glucose-Capillary 98  70 - 99 (mg/dL)    Comment 1 Notify RN     GLUCOSE, CAPILLARY     Status: Abnormal   Collection Time   02/16/11  4:51 PM      Component Value Range Comment   Glucose-Capillary 110 (*) 70 - 99 (mg/dL)    Comment 1 Notify RN     GLUCOSE, CAPILLARY     Status: Abnormal   Collection Time   02/16/11  9:13 PM      Component Value Range Comment   Glucose-Capillary 160 (*) 70 - 99 (mg/dL)    Comment 1 Notify RN     GLUCOSE, CAPILLARY     Status: Abnormal   Collection Time   02/17/11  6:22 AM      Component Value Range Comment   Glucose-Capillary 107 (*) 70 - 99 (mg/dL)    Comment 1 Notify RN     GLUCOSE, CAPILLARY     Status: Abnormal   Collection Time   02/17/11 11:32 AM      Component Value Range Comment   Glucose-Capillary 101 (*) 70 - 99 (mg/dL)    Comment 1 Notify RN     CLOSTRIDIUM DIFFICILE BY PCR  Status: Normal   Collection Time   02/17/11 12:30 PM      Component Value Range Comment   C difficile by pcr NEGATIVE  NEGATIVE    GLUCOSE, CAPILLARY     Status: Abnormal   Collection Time   02/18/11  6:32 AM      Component Value Range Comment   Glucose-Capillary 105 (*) 70 - 99 (mg/dL)    Comment 1 Notify RN     GLUCOSE, CAPILLARY     Status: Normal   Collection Time   02/18/11  7:40 AM      Component Value Range Comment   Glucose-Capillary 96  70 - 99 (mg/dL)    Comment 1 Notify RN      Physical Exam  Constitutional: He is oriented to person, place, and time. Appears fatigued HENT:  Head: Normocephalic.  Neck:  Collar in place  Cardiovascular: Normal rate.  Pulmonary/Chest: Breath sounds normal. He has no wheezes.  Abdominal: He exhibits no distension. There is no tenderness. Bowel sounds are active. Musculoskeletal: He exhibits no edema.  Neurological: He is alert and oriented to person, place, and time. A sensory deficit is present. Coordination reduced in BUEs Patient with 4/5 strength in R upper extremity, 3/5 on Left. HI are improving and now 3-3+/5. Marland Kitchen In the lower extremities strength was tr/5 proximally at the right hip flexors 1-tr at the right knee  extensors and flexors and to tr-1/5 right. Left leg 1+to3/5. R ADF/APF tr-1/5. Prox RLE 1/5.  continued leg sensation at 1 plus out of 2. Fine motor coordination of the upper extremities was fair. Cognitively he was alert and oriented x 3 with extra time, but was slow to process . CN exam unremarkable 2-12. DTR's 2+ in lower ext's and resting tone at knees and ankles of trace to tr/4.   Skin: Surgical site clean. C-collar fitting appropriately. Erythema and mild breakdown at sacrum with EPBC and stable.  Psychiatric: pleasant and very alert.  In good spirits. 1/4 exam    Assessment/Plan: 1. Functional deficits secondary to Cervical myelopathy causing tetraplegia C7 ASIA C which require 3+ hours per day of interdisciplinary therapy in a comprehensive inpatient rehab setting. Physiatrist is providing close team supervision and 24 hour management of active medical problems listed below. Physiatrist and rehab team continue to assess barriers to discharge/monitor patient progress toward functional and medical goals.  Mobility: Bed Mobility Bed Mobility: Yes Rolling Right: 3: Mod assist Rolling Right Details (indicate cue type and reason): vc on how to use leg loops Rolling Left: 3: Mod assist Rolling Left Details (indicate cue type and reason): Cues for UE use, A for bil LEs Left Sidelying to Sit: 1: +1 Total assist Left Sidelying to Sit Details (indicate cue type and reason): Pt able to A with bil UEs, but A needed for trunk and LEs Sitting - Scoot to Edge of Bed: 2: Max assist Sitting - Scoot to [employer] of Bed Details (indicate cue type and reason): Cues for hand placement and timing, A for anterior translation at hips Sit to Supine - Right: 1: +2 Total assist;HOB flat Scooting to HOB: 1: +2 Total assist Scooting to [facility] Details (indicate cue type and reason): A to position LEs in hooklying and cues to lift hips. A for scooting Transfers Transfers: Yes Sit to Stand: 1: +2 Total assist;With upper  extremity assist;From bed Sit to Stand Details (indicate cue type and reason): A needed for lifting and lowering. Pt unable to clear hps from bed  without +2 A. Tried Sara Plus, but increased thoracic pain, so stopped.  Stand to Sit: 1: +2 Total assist;With upper extremity assist;To bed Ambulation/Gait Ambulation/Gait Assistance: Not tested (comment) Stairs: No Wheelchair Mobility Wheelchair Mobility: Yes Wheelchair Assistance: 5: Investment banker, operational: Both upper extremities Wheelchair Parts Management: Needs assistance Distance: 150  ADL:    Cognition: Cognition Overall Cognitive Status: Appears within functional limits for tasks assessed Arousal/Alertness: Awake/alert Orientation Level: Oriented X4 Attention: Sustained Sustained Attention: Appears intact Memory: Appears intact Awareness: Appears intact Problem Solving: Appears intact Safety/Judgment: Appears intact Cognition Arousal/Alertness: Awake/alert Orientation Level: Oriented X4 Medical problem list  1.Cervical Myelopathy-S/Pcervical diskectomy with decompression 12/3.Cervical collar at all times.Decadron taper  2. DVT Prophylaxis/Anticoagulation: SCD/support hose.No current signs of DVT. Dopplers are negative.   3. Pain Management: Oxycodone/robaxin.Monitor with increased activity.    4. Neurogenic bladder and hematuria/BPH-.Foley tube in place with hematuria resolving.Continue flomax for now.  -ceftaz for pseudomonas in urine  5. Neurogenic bowel/bladdert: -am bowel  Program  -fibercon and dulcolax  -increased florastor  -explained to patient that some of pain is likely neurologic/sensory loss.  -lacks sphincter tone which doesn't help things  -is on gi prophylaxis  -decadron decreased to 0.5mg  bid  -stopped fiber due to frequent stools.  -intermittent cath/voiding trial--use coude cath and increase flomax to bid bp permitting. It's probably a overly optimistic to expect to him cath himself  given his HI use and c-collar but i would like to give it a try at least.  -if he has andy urethral bleeding will probably have to replace foley.    5.Steriod induced hyperglycemia-check blood sugars ac/hs and monitor as taper steroids. Cover with SSI and rx UTI.  Sugars are improving. Decrease cbg checks  6.Depression-celexa daily.Provide emotional support.   7.Dysphagia-  SLP so no major issues.  Work on positioning, pace, technique.  Will review breathing techniques with patient also.  8.Hyperlipidemia-zocor   9. Diarrhea on 1/4 of ?etiology - resolved. Will watch. 22  Alex Zianne Schubring 02/18/2011, 9:23 AM

## 2011-02-19 LAB — GLUCOSE, CAPILLARY
Glucose-Capillary: 111 mg/dL — ABNORMAL HIGH (ref 70–99)
Glucose-Capillary: 98 mg/dL (ref 70–99)
Glucose-Capillary: 130 mg/dL — ABNORMAL HIGH (ref 70–99)

## 2011-02-19 NOTE — Progress Notes (Signed)
Physical Therapy Note  Patient Details  Name: Ricky Bradley MRN: [id number] Date of Birth: July 22, 1933 Today's Date: 02/19/2011  1000-1055 (55 minutes) individual treatment Pain - no complaint of pain Focus of treatment : transfer training; therapeutic activities to improve sitting balance and tolerance to activity Bed mobility: supine to side to sit max assist +1 Treatment: W/C mobility -pt propelled wc 120 feet using bilateral UEs SBA on level surface with one rest break (X 2)  Transfers : bed to wc/wc to mat/ mat to wc max assist with sliding board - pt now attempting to assist with UEs with tactile and vcs, continuese to need assist for balance  Sitting: forward/backward leans to facilitate trunk ; sitting edge of mat with hands on small ball to challenge static sitting; pt able to maintain static position with hands on ball with close supervision < 3 minutes before fatigue. (3 reps) Ricky Bradley,JIM 02/19/2011, 11:56 AM

## 2011-02-19 NOTE — Progress Notes (Signed)
Patient ID: Ricky Bradley, male   DOB: 1933-12-08, 78 y.o.   MRN: [id number] Patient ID: Ricky Bradley, male   DOB: 07-05-1933, 78 y.o.   MRN: [id number] Patient ID: Ricky Bradley, male   DOB: [birth year], 78 y.o.   MRN: [id number] Patient ID: Ricky Bradley, male   DOB: 02-Jan-1934, 78 y.o.   MRN: [id number] Patient ID: Ricky Bradley, male   DOB: 13-Jan-1934, 78 y.o.   MRN: [id number] Patient ID: Ricky Bradley, male   DOB: 1933-11-02, 78 y.o.   MRN: [id number] Subjective/Complaints: Has urge to void but has required caths.  Tolerated caths a little better yesterday 1/4 He had diarrhea all day yesturday - no loose  BMs yesterday and  this am; he was eating breakfast and had no complaints  Review of Systems  HENT: Positive for neck pain.   Musculoskeletal: Positive for back pain.  All other systems reviewed and are negative.   Objective: Vital Signs: Blood pressure 104/65, pulse 82, temperature 98.1 F (36.7 C), temperature source Oral, resp. rate 20, weight 162 lb 0.6 oz (73.5 kg), SpO2 94.00%. No results found. Results for orders placed during the hospital encounter of 01/27/11 (from the past 72 hour(s))  GLUCOSE, CAPILLARY     Status: Abnormal   Collection Time   02/16/11  4:51 PM      Component Value Range Comment   Glucose-Capillary 110 (*) 70 - 99 (mg/dL)    Comment 1 Notify RN     GLUCOSE, CAPILLARY     Status: Abnormal   Collection Time   02/16/11  9:13 PM      Component Value Range Comment   Glucose-Capillary 160 (*) 70 - 99 (mg/dL)    Comment 1 Notify RN     GLUCOSE, CAPILLARY     Status: Abnormal   Collection Time   02/17/11  6:22 AM      Component Value Range Comment   Glucose-Capillary 107 (*) 70 - 99 (mg/dL)    Comment 1 Notify RN     GLUCOSE, CAPILLARY     Status: Abnormal   Collection Time   02/17/11 11:32 AM      Component Value Range Comment   Glucose-Capillary 101 (*) 70 - 99 (mg/dL)    Comment 1 Notify RN     CLOSTRIDIUM DIFFICILE BY PCR     Status: Normal   Collection Time   02/17/11 12:30 PM      Component Value Range Comment   C difficile by pcr NEGATIVE  NEGATIVE    GLUCOSE, CAPILLARY     Status: Abnormal   Collection Time   02/18/11  6:32 AM      Component Value Range Comment   Glucose-Capillary 105 (*) 70 - 99 (mg/dL)    Comment 1 Notify RN     GLUCOSE, CAPILLARY     Status: Normal   Collection Time   02/18/11  7:40 AM      Component Value Range Comment   Glucose-Capillary 96  70 - 99 (mg/dL)    Comment 1 Notify RN     GLUCOSE, CAPILLARY     Status: Abnormal   Collection Time   02/19/11  5:39 AM      Component Value Range Comment   Glucose-Capillary 111 (*) 70 - 99 (mg/dL)   GLUCOSE, CAPILLARY     Status: Normal   Collection Time   02/19/11  7:26 AM      Component Value Range Comment   Glucose-Capillary  98  70 - 99 (mg/dL)    Comment 1 Notify RN      Physical Exam  Constitutional: He is oriented to person, place, and time. Appears fatigued HENT:  Head: Normocephalic.  Neck:  Collar in place  Cardiovascular: Normal rate.  Pulmonary/Chest: Breath sounds normal. He has no wheezes.  Abdominal: He exhibits no distension. There is no tenderness. Bowel sounds are active. Musculoskeletal: He exhibits no edema.  Neurological: He is alert and oriented to person, place, and time. A sensory deficit is present. Coordination reduced in BUEs Patient with 4/5 strength in R upper extremity, 3/5 on Left. HI are improving and now 3-3+/5. Marland Kitchen In the lower extremities strength was tr/5 proximally at the right hip flexors 1-tr at the right knee extensors and flexors and to tr-1/5 right. Left leg 1+to3/5. R ADF/APF tr-1/5. Prox RLE 1/5.  continued leg sensation at 1 plus out of 2. Fine motor coordination of the upper extremities was fair. Cognitively he was alert and oriented x 3 with extra time, but was slow to process . CN exam unremarkable 2-12. DTR's 2+ in lower ext's and resting tone at knees and ankles of trace to tr/4.   Skin: Surgical site clean. C-collar fitting  appropriately. Erythema and mild breakdown at sacrum with EPBC and stable.  Psychiatric: pleasant and very alert.  In good spirits. 1/4 exam    Assessment/Plan: 1. Functional deficits secondary to Cervical myelopathy causing tetraplegia C7 ASIA C which require 3+ hours per day of interdisciplinary therapy in a comprehensive inpatient rehab setting. Physiatrist is providing close team supervision and 24 hour management of active medical problems listed below. Physiatrist and rehab team continue to assess barriers to discharge/monitor patient progress toward functional and medical goals.  Mobility: Bed Mobility Bed Mobility: Yes Rolling Right: 3: Mod assist Rolling Right Details (indicate cue type and reason): vc on how to use leg loops Rolling Left: 3: Mod assist Rolling Left Details (indicate cue type and reason): Cues for UE use, A for bil LEs Left Sidelying to Sit: 1: +1 Total assist Left Sidelying to Sit Details (indicate cue type and reason): Pt able to A with bil UEs, but A needed for trunk and LEs Sitting - Scoot to Edge of Bed: 2: Max assist Sitting - Scoot to [employer] of Bed Details (indicate cue type and reason): Cues for hand placement and timing, A for anterior translation at hips Sit to Supine - Right: 1: +2 Total assist;HOB flat Scooting to HOB: 1: +2 Total assist Scooting to [facility] Details (indicate cue type and reason): A to position LEs in hooklying and cues to lift hips. A for scooting Transfers Transfers: Yes Sit to Stand: 1: +2 Total assist;With upper extremity assist;From bed Sit to Stand Details (indicate cue type and reason): A needed for lifting and lowering. Pt unable to clear hps from bed without +2 A. Tried Sara Plus, but increased thoracic pain, so stopped.  Stand to Sit: 1: +2 Total assist;With upper extremity assist;To bed Ambulation/Gait Ambulation/Gait Assistance: Not tested (comment) Stairs: No Wheelchair Mobility Wheelchair Mobility: Yes Wheelchair  Assistance: 5: Investment banker, operational: Both upper extremities Wheelchair Parts Management: Needs assistance Distance: 150  ADL:    Cognition: Cognition Overall Cognitive Status: Appears within functional limits for tasks assessed Arousal/Alertness: Awake/alert Orientation Level: Oriented X4 Attention: Sustained Sustained Attention: Appears intact Memory: Appears intact Awareness: Appears intact Problem Solving: Appears intact Safety/Judgment: Appears intact Cognition Arousal/Alertness: Awake/alert Orientation Level: Oriented X4 Medical problem list  1.Cervical Myelopathy-S/Pcervical diskectomy  with decompression 12/3.Cervical collar at all times.Decadron taper  2. DVT Prophylaxis/Anticoagulation: SCD/support hose.No current signs of DVT. Dopplers are negative.   3. Pain Management: Oxycodone/robaxin.Monitor with increased activity.    4. Neurogenic bladder and hematuria/BPH-.Foley tube in place with hematuria resolving.Continue flomax for now.  -ceftaz for pseudomonas in urine  5. Neurogenic bowel/bladdert: -am bowel  Program  -fibercon and dulcolax  -increased florastor  -explained to patient that some of pain is likely neurologic/sensory loss.  -lacks sphincter tone which doesn't help things  -is on gi prophylaxis  -decadron decreased to 0.5mg  bid  -stopped fiber due to frequent stools.  -intermittent cath/voiding trial--use coude cath and increase flomax to bid bp permitting. It's probably a overly optimistic to expect to him cath himself given his HI use and c-collar but i would like to give it a try at least.  -if he has andy urethral bleeding will probably have to replace foley.    5.Steriod induced hyperglycemia-check blood sugars ac/hs and monitor as taper steroids. Cover with SSI and rx UTI.  Sugars are improving. Decrease cbg checks  6.Depression-celexa daily.Provide emotional support.   7.Dysphagia-  SLP so no major issues.  Work on positioning,  pace, technique.  Will review breathing techniques with patient also.  8.Hyperlipidemia-zocor   9. Diarrhea on 1/4 of ?etiology - resolved. Will watch. 23  Alex Gaile Allmon 02/19/2011, 8:54 AM

## 2011-02-19 NOTE — Progress Notes (Signed)
No void in 6 hours. Reports feeling urge to void, but unable. I &O cath=350cc's. Tolerated without complaint of. Ricky Bradley

## 2011-02-19 NOTE — Progress Notes (Addendum)
Patient scanned for 175 at 1300, cath for 500 ml. Maximum assistance for transfer with sliding board. Bottom redness,  blanchable, epc cream applied. Takes medication whole with applesauce. Suppository held today per order. Continue plan of care. At 1627, patient complaint of pressure in abdomen and felt the urge to void. Offered urinal, he was able to void about 5 ml of urine but complaint of severe burning. Bladder scanned for 750, I&O cath for 900 with yellowish clear urine.

## 2011-02-19 NOTE — Progress Notes (Signed)
Felt urge to void, but unable. I&O cath=450cc's. Tolerated cath without complaint of. No stools this shift thus far. Sacrum red with peeling skin, barrier cream applied & turned side to side. Scrotal area red- micro gaurd powder applied. SCD's on. C-collar in place. Tawanna Solo

## 2011-02-20 LAB — GLUCOSE, CAPILLARY: Glucose-Capillary: 103 mg/dL — ABNORMAL HIGH (ref 70–99)

## 2011-02-20 NOTE — Progress Notes (Signed)
2+ slideboard transfer. Requiring I&O cath q 6hrs with coude catheter. Bowel program with daily suppository. Up to bedside commode for bowel program with dig stimulation, resulting in medium, semiformed stool. Anusol hemorrhoid cream in use. EPBC to sacrum. Oxy IR 5mg  x 1 @1400 .

## 2011-02-20 NOTE — Progress Notes (Signed)
Subjective/Complaints: Has voided very small amounts.  Has bladder discomfort prior to cath when bladder is filling up. 1/7 Review of Systems  HENT: Positive for neck pain.   Musculoskeletal: Positive for back pain.  All other systems reviewed and are negative.   Objective: Vital Signs: Blood pressure 98/62, pulse 80, temperature 97.3 F (36.3 C), temperature source Oral, resp. rate 22, weight 73.5 kg (162 lb 0.6 oz), SpO2 94.00%. No results found. Results for orders placed during the hospital encounter of 01/27/11 (from the past 72 hour(s))  GLUCOSE, CAPILLARY     Status: Abnormal   Collection Time   02/17/11 11:32 AM      Component Value Range Comment   Glucose-Capillary 101 (*) 70 - 99 (mg/dL)    Comment 1 Notify RN     CLOSTRIDIUM DIFFICILE BY PCR     Status: Normal   Collection Time   02/17/11 12:30 PM      Component Value Range Comment   C difficile by pcr NEGATIVE  NEGATIVE    GLUCOSE, CAPILLARY     Status: Abnormal   Collection Time   02/18/11  6:32 AM      Component Value Range Comment   Glucose-Capillary 105 (*) 70 - 99 (mg/dL)    Comment 1 Notify RN     GLUCOSE, CAPILLARY     Status: Normal   Collection Time   02/18/11  7:40 AM      Component Value Range Comment   Glucose-Capillary 96  70 - 99 (mg/dL)    Comment 1 Notify RN     GLUCOSE, CAPILLARY     Status: Abnormal   Collection Time   02/19/11  5:39 AM      Component Value Range Comment   Glucose-Capillary 111 (*) 70 - 99 (mg/dL)   GLUCOSE, CAPILLARY     Status: Normal   Collection Time   02/19/11  7:26 AM      Component Value Range Comment   Glucose-Capillary 98  70 - 99 (mg/dL)    Comment 1 Notify RN     GLUCOSE, CAPILLARY     Status: Abnormal   Collection Time   02/19/11 11:32 AM      Component Value Range Comment   Glucose-Capillary 130 (*) 70 - 99 (mg/dL)    Comment 1 Notify RN     GLUCOSE, CAPILLARY     Status: Abnormal   Collection Time   02/20/11  6:28 AM      Component Value Range Comment   Glucose-Capillary 116 (*) 70 - 99 (mg/dL)    Physical Exam  Constitutional: He is oriented to person, place, and time. Appears fatigued HENT:  Head: Normocephalic.  Neck:  Collar in place  Cardiovascular: Normal rate.  Pulmonary/Chest: Breath sounds normal. He has no wheezes.  Abdominal: He exhibits no distension. There is no tenderness. Bowel sounds are active. Musculoskeletal: He exhibits no edema.  Neurological: He is alert and oriented to person, place, and time. A sensory deficit is present. Coordination reduced in BUEs Patient with 4/5 strength in R upper extremity, 3/5 on Left. HI are  3-3+/5.  In the lower extremities strength was tr/5 proximally at the right hip flexors 1-tr at the right knee extensors and flexors and to tr-1/5 right. Left leg 1+to3/5. R ADF/APF tr-1/5. Prox RLE 1/5.  continued leg sensation at 1 plus out of 2. Fine motor coordination of the upper extremities was fair. Cognitively he was alert and oriented x 3 with improved awareness and  insight. CN exam unremarkable 2-12. DTR's 2+ in lower ext's Skin: Surgical site clean. C-collar fitting appropriately. Erythema and mild breakdown at sacrum with EPBC and stable.  Psychiatric: pleasant and very alert.  In good spirits.   1/7 exam    Assessment/Plan: 1. Functional deficits secondary to Cervical myelopathy causing tetraplegia C7 ASIA C which require 3+ hours per day of interdisciplinary therapy in a comprehensive inpatient rehab setting. Physiatrist is providing close team supervision and 24 hour management of active medical problems listed below. Physiatrist and rehab team continue to assess barriers to discharge/monitor patient progress toward functional and medical goals.  Mobility: Bed Mobility Bed Mobility: Yes Rolling Right: 3: Mod assist Rolling Right Details (indicate cue type and reason): vc on how to use leg loops Rolling Left: 3: Mod assist Rolling Left Details (indicate cue type and reason): Cues  for UE use, A for bil LEs Left Sidelying to Sit: 1: +1 Total assist Left Sidelying to Sit Details (indicate cue type and reason): Pt able to A with bil UEs, but A needed for trunk and LEs Sitting - Scoot to Edge of Bed: 2: Max assist Sitting - Scoot to [employer] of Bed Details (indicate cue type and reason): Cues for hand placement and timing, A for anterior translation at hips Sit to Supine - Right: 1: +2 Total assist;HOB flat Scooting to HOB: 1: +2 Total assist Scooting to [facility] Details (indicate cue type and reason): A to position LEs in hooklying and cues to lift hips. A for scooting Transfers Transfers: Yes Sit to Stand: 1: +2 Total assist;With upper extremity assist;From bed Sit to Stand Details (indicate cue type and reason): A needed for lifting and lowering. Pt unable to clear hps from bed without +2 A. Tried Sara Plus, but increased thoracic pain, so stopped.  Stand to Sit: 1: +2 Total assist;With upper extremity assist;To bed Ambulation/Gait Ambulation/Gait Assistance: Not tested (comment) Stairs: No Wheelchair Mobility Wheelchair Mobility: Yes Wheelchair Assistance: 5: Investment banker, operational: Both upper extremities Wheelchair Parts Management: Needs assistance Distance: 150  ADL:    Cognition: Cognition Overall Cognitive Status: Appears within functional limits for tasks assessed Arousal/Alertness: Awake/alert Orientation Level: Oriented X4 Attention: Sustained Sustained Attention: Appears intact Memory: Appears intact Awareness: Appears intact Problem Solving: Appears intact Safety/Judgment: Appears intact Cognition Arousal/Alertness: Awake/alert Orientation Level: Oriented X4 Medical problem list  1.Cervical Myelopathy-S/Pcervical diskectomy with decompression 12/3.Cervical collar at all times.Decadron taper  2. DVT Prophylaxis/Anticoagulation: SCD/support hose.No current signs of DVT. Dopplers are negative.   3. Pain Management:  Oxycodone/robaxin.Monitor with increased activity.    4. Neurogenic bladder and hematuria/BPH-.Foley tube in place with hematuria resolving.Continue flomax for now.  -ceftaz for pseudomonas in urine completed.  5. Neurogenic bowel/bladder: -am bowel  Program  -fibercon and dulcolax  -increased florastor  -explained to patient that some of pain is likely neurologic/sensory loss.  -lacks sphincter tone which doesn't help things  -is on gi prophylaxis  -decadron decreased to 0.5mg  bid  -stopped fiber due to frequent stools.  -intermittent cath/voiding trial--use coude cath and increase flomax to bid bp permitting. It's probably a overly optimistic to expect to him cath himself given his HI use and c-collar but i would like to give it a try at least.  -if he has andy urethral bleeding will probably have to replace foley.  -pt wishes to continue voiding and cathing trial.  Need to keep volumes less than 450 cc  -could use ditropan for bladder pain/spasm but this would decrease voluntary ability to  void.  -i'm afraid that BPH is more than his bladder musculature can overcome.    5.Steriod induced hyperglycemia-check blood sugars ac/hs and monitor as taper steroids. Cover with SSI and rx UTI.  Sugars are improving. Decrease cbg checks  6.Depression-celexa daily.Provide emotional support. i see nice improvement here.    7.Dysphagia-  SLP so no major issues.  Work on positioning, pace, technique.  Will review breathing techniques with patient also.  8.Hyperlipidemia-zocor   24  Ricky Bradley,ZACHARY T 02/20/2011, 8:30 AM

## 2011-02-20 NOTE — Progress Notes (Addendum)
Occupational Therapy Session Note  Patient Details  Name: Ricky Bradley MRN: [id number] Date of Birth: [birth year]  Today's Date: 02/20/2011 Time: 1300-1330 Time Calculation (min): 30 min  Precautions: Precautions Precautions: Fall Precaution Comments: Cervical precautions Required Braces or Orthoses: Yes Cervical Brace: Hard collar;Applied in supine position Restrictions Weight Bearing Restrictions: No   Skilled Therapeutic Interventions/Progress Updates:  Pt engaged in bed mobility activities. Pt needed decreased assistance moving legs off EOB using leg loops. Pt required max assistance from side lying to sit but was able to sit on  EOB with moderate assistance. Total assistance required for bed to w/c transfer using [employer]. Focus on bed mobility, activity tolerance, self care, and dynamic sitting balance      Pain Pain Assessment Pain Assessment: 0-10 Pain Score:   6 Pain Type: Acute pain Pain Location: Back Pain Orientation: Lower Pain Descriptors: Aching Pain Onset: Progressive Pain Intervention(s): RN made aware  Therapy/Group: Individual Therapy  [name] 02/20/2011, 3:06 PM

## 2011-02-20 NOTE — Progress Notes (Signed)
Physical Therapy Note  Patient Details  Name: Ricky Bradley MRN: [id number] Date of Birth: Apr 09, 1933 Today's Date: 02/20/2011  11:25- 12:05 individual therapy pain unrated. Pt repositioned. Session focused on using leg loops to get legs off bed and for rolling with min assist x 2 sit to suupine pt hooked rt. Leg then used leg loop for other. Pt required assistance to hook rt, leg with elbow then could manage other leg supervision x 1 and mod assist x 1. Practiced elbow leans with transition to sitting with max vc needed for WB through rt. Hand when coming up on left side to keep balance forward both ways min to mod assist. Performed lateral scoots to [facility] with maxislide under bottom to aid transfers. Pt postion was rotated with hands on one side to lead with bottom.   [name] 02/20/2011, 12:35 PM

## 2011-02-20 NOTE — Progress Notes (Signed)
Occupational Therapy Session Note  Patient Details  Name: Ricky Bradley MRN: [id number] Date of Birth: 06/03/1933  Today's Date: 02/20/2011 Time: 1000-1100 Time Calculation (min): 60 min  Precautions: Precautions Precautions: Fall Precaution Comments: Cervical precautions Required Braces or Orthoses: Yes Cervical Brace: Hard collar;Applied in supine position Restrictions Weight Bearing Restrictions: No   Skilled Therapeutic Interventions/Progress Updates:    ADL retraining supine with HOB elevated and sitting unsupported EOB.   Pt required max assist to pull up with bed rails to sit in circle sitting to bathe lower legs.Pt able to bathe lower legs but required assistance to bathe feet thoroughly.  Pt pulled up pants while supine and rolling side to side.  Pt continues to require verbal cues to ask for assistance with BLE when rolling.  Pt completed UB bathing and dressing seated unsupported EOB.  Pt required min to max assist with unsupported sitting when engaging UE in tasks.  Pt supervision with static sitting unsupported with BUE support.  Focus on activity tolerance, bed mobility, unsupported static and dynamic sitting balance, and circle sitting to perform ADLs.    Pain Pain Assessment Pain Assessment: No/denies pain  Therapy/Group: Individual Therapy  [name] 02/20/2011, 12:00 PM

## 2011-02-20 NOTE — Progress Notes (Signed)
Physical Therapy Note  Patient Details  Name: Ricky Bradley MRN: [id number] Date of Birth: 1933-07-10 Today's Date: 02/20/2011  13:45- 14:30 individual therapy 5/10 pain in back. Meds. Brought during session.  wc mobility 2x 150' distant supervision. Practiced pulling himself forward in wc onto elbows for pressure relief and balance. vc needed to lock brakes. Pt able to lean all directions needed then return to sitting for pressure reilef in chair. pt encouraged to perform independently in room. Practiced using leg loops to get legs off and on leg rest with supervision and max vc for WB on elbows and using leverage to balance himself verses trying to pull his legs up with both UEs. Pt with 3 LOB with out WB through elbow. Pt able to manage leg rest with vc.  Sliding board transfer heavy mod assist with tacile cues for weight  Shift and hand placement.pt. Has poor push power with transfers.  [name] 02/20/2011, 2:59 PM

## 2011-02-20 NOTE — Progress Notes (Signed)
At 2410 patient unable to void. Reports feeling urge to void. I&O cath=350cc's. Turning every 2 hours. SCD's in place. Barrier cream applied to sacrum and microgaurd powder to scrotal area. At 0445 large incontinent void, but continued to complain of fullness. I&O cath =450cc's. Tawanna Solo

## 2011-02-21 DIAGNOSIS — N319 Neuromuscular dysfunction of bladder, unspecified: Secondary | ICD-10-CM

## 2011-02-21 DIAGNOSIS — G8250 Quadriplegia, unspecified: Secondary | ICD-10-CM

## 2011-02-21 DIAGNOSIS — K592 Neurogenic bowel, not elsewhere classified: Secondary | ICD-10-CM

## 2011-02-21 DIAGNOSIS — M4712 Other spondylosis with myelopathy, cervical region: Secondary | ICD-10-CM

## 2011-02-21 DIAGNOSIS — Z5189 Encounter for other specified aftercare: Secondary | ICD-10-CM

## 2011-02-21 LAB — GLUCOSE, CAPILLARY
Glucose-Capillary: 108 mg/dL — ABNORMAL HIGH (ref 70–99)
Glucose-Capillary: 83 mg/dL (ref 70–99)
Glucose-Capillary: 84 mg/dL (ref 70–99)

## 2011-02-21 MED ORDER — BETHANECHOL CHLORIDE 25 MG PO TABS
25.0000 mg | ORAL_TABLET | Freq: Three times a day (TID) | ORAL | Status: DC
  Administered 2011-02-21 – 2011-02-24 (×10): 25 mg via ORAL
  Filled 2011-02-21 (×13): qty 1

## 2011-02-21 MED ORDER — LIDOCAINE HCL 2 % EX GEL
CUTANEOUS | Status: DC | PRN
  Administered 2011-02-22: 5 via URETHRAL
  Filled 2011-02-21 (×5): qty 20

## 2011-02-21 NOTE — Progress Notes (Signed)
Physical Therapy Note  Patient Details  Name: Ricky Bradley MRN: [id number] Date of Birth: 04/27/1933 Today's Date: 02/21/2011  14:15- 14:45:  wc mobility supervision 2 x 150'. Standing frame x 2 to stretch LEs.   [name] 02/21/2011, 4:34 PM

## 2011-02-21 NOTE — Progress Notes (Signed)
Patient ID: Ricky Bradley, male   DOB: Mar 10, 1933, 78 y.o.   MRN: [id number] Subjective/Complaints: Reports a small voluntary void.  Has bladder discomfort prior to cath when bladder is filling up or when he's really forcing it to go. 1/8 Review of Systems  HENT: Positive for neck pain.   Musculoskeletal: Positive for back pain.  All other systems reviewed and are negative.   Objective: Vital Signs: Blood pressure 113/70, pulse 72, temperature 98.1 F (36.7 C), temperature source Oral, resp. rate 18, weight 73.5 kg (162 lb 0.6 oz), SpO2 98.00%. No results found. Results for orders placed during the hospital encounter of 01/27/11 (from the past 72 hour(s))  GLUCOSE, CAPILLARY     Status: Normal   Collection Time   02/18/11  7:40 AM      Component Value Range Comment   Glucose-Capillary 96  70 - 99 (mg/dL)    Comment 1 Notify RN     GLUCOSE, CAPILLARY     Status: Abnormal   Collection Time   02/19/11  5:39 AM      Component Value Range Comment   Glucose-Capillary 111 (*) 70 - 99 (mg/dL)   GLUCOSE, CAPILLARY     Status: Normal   Collection Time   02/19/11  7:26 AM      Component Value Range Comment   Glucose-Capillary 98  70 - 99 (mg/dL)    Comment 1 Notify RN     GLUCOSE, CAPILLARY     Status: Abnormal   Collection Time   02/19/11 11:32 AM      Component Value Range Comment   Glucose-Capillary 130 (*) 70 - 99 (mg/dL)    Comment 1 Notify RN     GLUCOSE, CAPILLARY     Status: Abnormal   Collection Time   02/20/11  6:28 AM      Component Value Range Comment   Glucose-Capillary 116 (*) 70 - 99 (mg/dL)   GLUCOSE, CAPILLARY     Status: Normal   Collection Time   02/20/11  7:24 AM      Component Value Range Comment   Glucose-Capillary 99  70 - 99 (mg/dL)   GLUCOSE, CAPILLARY     Status: Normal   Collection Time   02/20/11 11:08 AM      Component Value Range Comment   Glucose-Capillary 88  70 - 99 (mg/dL)    Comment 1 Notify RN     GLUCOSE, CAPILLARY     Status: Abnormal   Collection  Time   02/20/11  4:37 PM      Component Value Range Comment   Glucose-Capillary 133 (*) 70 - 99 (mg/dL)    Comment 1 Notify RN     GLUCOSE, CAPILLARY     Status: Abnormal   Collection Time   02/20/11  9:24 PM      Component Value Range Comment   Glucose-Capillary 103 (*) 70 - 99 (mg/dL)   GLUCOSE, CAPILLARY     Status: Abnormal   Collection Time   02/21/11  5:55 AM      Component Value Range Comment   Glucose-Capillary 108 (*) 70 - 99 (mg/dL)    Physical Exam  Constitutional: He is oriented to person, place, and time. Appears fatigued HENT:  Head: Normocephalic.  Neck:  Collar in place  Cardiovascular: Normal rate.  Pulmonary/Chest: Breath sounds normal. He has no wheezes.  Abdominal: He exhibits no distension. There is no tenderness. Bowel sounds are active. Musculoskeletal: He exhibits no edema.  Neurological: He  is alert and oriented to person, place, and time. A sensory deficit is present. Coordination reduced in BUEs Patient with 4/5 strength in R upper extremity, 3/5 on Left. HI are  3-3+/5.  In the lower extremities strength was tr/5 proximally at the right hip flexors 1-tr at the right knee extensors and flexors and to tr-1/5 right. Left leg 1+to3/5. R ADF/APF tr-1/5. Prox RLE 1/5.  continued leg sensation at 1 plus out of 2. Fine motor coordination of the upper extremities was fair. Cognitively he was alert and oriented x 3 with improved awareness and insight. CN exam unremarkable 2-12. DTR's 2+ in lower ext's Skin: Surgical site clean. C-collar fitting appropriately. Erythema and mild breakdown at sacrum with EPBC and stable.  Psychiatric: pleasant and very alert.  In good spirits.   1/8 exam    Assessment/Plan: 1. Functional deficits secondary to Cervical myelopathy causing tetraplegia C7 ASIA C which require 3+ hours per day of interdisciplinary therapy in a comprehensive inpatient rehab setting. Physiatrist is providing close team supervision and 24 hour management of  active medical problems listed below. Physiatrist and rehab team continue to assess barriers to discharge/monitor patient progress toward functional and medical goals.  Had an extensive telephone conversation with daughter yesterday.  Mobility: Bed Mobility Bed Mobility: Yes Rolling Right: 3: Mod assist Rolling Right Details (indicate cue type and reason): vc on how to use leg loops Rolling Left: 3: Mod assist Rolling Left Details (indicate cue type and reason): Cues for UE use, A for bil LEs Left Sidelying to Sit: 1: +1 Total assist Left Sidelying to Sit Details (indicate cue type and reason): Pt able to A with bil UEs, but A needed for trunk and LEs Sitting - Scoot to Edge of Bed: 2: Max assist Sitting - Scoot to [employer] of Bed Details (indicate cue type and reason): Cues for hand placement and timing, A for anterior translation at hips Sit to Supine - Right (DO NOT USE): 1: +2 Total assist;HOB flat Scooting to HOB: 1: +2 Total assist Scooting to [facility] Details (indicate cue type and reason): A to position LEs in hooklying and cues to lift hips. A for scooting Transfers Transfers: Yes Sit to Stand: 1: +2 Total assist;With upper extremity assist;From bed Sit to Stand Details (indicate cue type and reason): A needed for lifting and lowering. Pt unable to clear hps from bed without +2 A. Tried Sara Plus, but increased thoracic pain, so stopped.  Stand to Sit: 1: +2 Total assist;With upper extremity assist;To bed Ambulation/Gait Ambulation/Gait Assistance: Not tested (comment) Stairs: No Wheelchair Mobility Wheelchair Mobility: Yes Wheelchair Assistance: 5: Investment banker, operational: Both upper extremities Wheelchair Parts Management: Needs assistance Distance: 150  ADL:    Cognition: Cognition Overall Cognitive Status: Appears within functional limits for tasks assessed Arousal/Alertness: Awake/alert Orientation Level: Oriented X4 Attention: Sustained Sustained Attention:  Appears intact Memory: Appears intact Awareness: Appears intact Problem Solving: Appears intact Safety/Judgment: Appears intact Cognition Arousal/Alertness: Awake/alert Orientation Level: Oriented X4 Medical problem list  1.Cervical Myelopathy-S/Pcervical diskectomy with decompression 12/3.Cervical collar at all times.Decadron taper  2. DVT Prophylaxis/Anticoagulation: SCD/support hose.No current signs of DVT. Dopplers are negative.   3. Pain Management: Oxycodone/robaxin.Monitor with increased activity.    4. Neurogenic bladder and hematuria/BPH-.Continue flomax for now.  -ceftaz for pseudomonas in urine completed.  5. Neurogenic bowel/bladder: -am bowel  Program  -fibercon and dulcolax  -increased florastor  -explained to patient that some of pain is likely neurologic/sensory loss.  -lacks sphincter tone which doesn't help  things  -is on gi prophylaxis  -decadron decreased to 0.5mg  bid  -stopped fiber due to frequent stools.  -intermittent cath/voiding trial--use coude cath and increase flomax to bid bp permitting. It's probably a overly optimistic to expect to him cath himself given his HI use and c-collar but i would like to give it a try at least.  -if he has andy urethral bleeding will probably have to replace foley.  -pt wishes to continue voiding and cathing trial.  Need to keep volumes less than 450 cc  -will trial urecholine, but it may make spasms worse.  -could use ditropan for bladder pain/spasm but this would decrease voluntary ability to void.  -i'm afraid that BPH is more than his bladder musculature can overcome.    5.Steriod induced hyperglycemia-check blood sugars ac/hs and monitor as taper steroids. Cover with SSI and rx UTI.  Sugars are improving. Decrease cbg checks  6.Depression-celexa daily.Provide emotional support. i see nice improvement here.    7.Dysphagia-  SLP so no major issues.  Work on positioning, pace, technique.  Will review breathing  techniques with patient also.  8.Hyperlipidemia-zocor   9. FEN--eating well.  Pt wishes to stay on gluten free diet as it seems to be working for him.  25  Skyelyn Scruggs T 02/21/2011, 6:45 AM

## 2011-02-21 NOTE — Progress Notes (Signed)
Pt up with sliding board and 2 assist. Lower extremeties flaccid with no purposefull movement. While assisting with bowel program pt is to not be left alone on bedside commode. Assistance of bowels with suppository and digital stimulation in am. Pt complains of burning with urination, but has trouble voiding, is incontinent of urine at times, bladder scan every 6hours to be cath'd if over 400cc scan with coude cath per order.  Ordered lidocain for use with in and out cath. Pt complains of pain in neck and lower back, pain meds as ordered seems to help. Bottom is red applying cream and powder to area repositioning every 2 hours. Incision on anterior neck had steristrips and uses aspen collar at all times. Pt is on gluten free diet. Continue with plan of care.    Ricky Bradley, Ricky Bradley

## 2011-02-21 NOTE — Progress Notes (Signed)
Nursing Note : unable to void and feels full at 2300 02/20/11 pt I & O cathed for 575 cc abdominal tenderness 0645 I & O cathed for 200 cc. Pt tolerated well.wbb

## 2011-02-21 NOTE — Patient Care Conference (Signed)
Inpatient RehabilitationTeam Conference Note Date: 02/21/2011   Time: 4:54 PM    Patient Name: Ricky Bradley      Medical Record Number: [id number]  Date of Birth: [birth year] Sex: Male         Room/Bed: 4029/4029-01 Payor Info: Payor: MEDICARE  Plan: MEDICARE PART A AND B  Product Type: *No Product type*     Admitting Diagnosis: C6 C7 STENOSIS WITH MYELOPATHY  Admit Date/Time:  01/27/2011  3:38 PM Admission Comments: No comment available   Primary Diagnosis:  Myelopathy Principal Problem: Myelopathy  Patient Active Problem List  Diagnoses Date Noted  . Myelopathy 01/27/2011  . Gross hematuria 01/16/2011  . Cord compression 01/10/2011  . Irritable bowel syndrome (IBS) 01/10/2011  . HTN (hypertension) 01/09/2011  . Dyslipidemia 01/09/2011  . BPH (benign prostatic hyperplasia) 01/09/2011  . GERD (gastroesophageal reflux disease) 01/09/2011  . Lumbar stenosis with neurogenic claudication 01/09/2011    Expected Discharge Date: Expected Discharge Date:  (SNF is d/c plan-await bed offers)  Team Members Present: Physician: Dr. Faith Rogue Case Manager Present: Melanee Spry, RN Social Worker Present: Amada Jupiter, LCSW PT Present: Karolee Stamps, Judith Blonder, PTA OT Present: Edwin Cap, OT Other (Discipline and Name): Tora Duck, PPS Coordinator RN Present: Carlean Purl    Current Status/Progress Goal Weekly Team Focus  Medical   bladder spasms, retention, occasional kickoffs  lower filling volumes for bladder. decreased bladder spasms, improved bladder emptying  see above.  optimize po intake   Bowel/Bladder   Managed bowel program. Requires I&O cath q 6hrs  Managed bowel and bladder  Initiate time toileting q 4hrs   Swallow/Nutrition/ Hydration             ADL's   bathing mod A, UB dressing min A, LB dressing tot A, toilet transfers tot A sliding board  overall min A   sitting balance, activity tolerance   Mobility   max assist transfer with sliding board, min to mod  assist bed mobility with leg loops, supervision wc  max assist transfer consistently, mod A. bed mobillity, supervision wc  transition movements and sitting balance   Communication             Safety/Cognition/ Behavioral Observations            Pain   c/o of back discomfort  <3  Oxy IR 5mg  q 4hrs prn   Skin   Sacrum red  No additional skin breakdown  Continue to use epbc and assist with turn q 2hrs.      *See Interdisciplinary Assessment and Plan and progress notes for long and short-term goals  Barriers to Discharge: ongoing neurological deficits including neurogenic bladder.    Possible Resolutions to Barriers:  establishment of more frequent cathing schedule and voiding schedule    Discharge Planning/Teaching Needs:  plan changed to SNF - bed search underway      Team Discussion: Brighter affect.  Pt wants to keep trying voiding-MD plans trial of urecholine.  Probably pt will need foley for SNF.   Revisions to Treatment Plan: none    Continued Need for Acute Rehabilitation Level of Care: The patient requires daily medical management by a physician with specialized training in physical medicine and rehabilitation for the following conditions: Daily direction of a multidisciplinary physical rehabilitation program to ensure safe treatment while eliciting the highest outcome that is of practical value to the patient.: Yes Daily medical management of patient stability for increased activity during participation in an intensive rehabilitation regime.:  Yes Daily analysis of laboratory values and/or radiology reports with any subsequent need for medication adjustment of medical intervention for : Post surgical problems;Neurological problems  Pt agrees w/ plan to d/c SNF-"What else can I do?"   He acknowledges he would rather his family not see him in therapies.  He says "I am taking it a day at a time".  Brock Ra 02/21/2011, 4:54 PM

## 2011-02-21 NOTE — Progress Notes (Signed)
Occupational Therapy Session Note  Patient Details  Name: Ricky Bradley MRN: [id number] Date of Birth: 07-20-1933  Today's Date: 02/21/2011  1st Session Time: 1005-1100 Time Calculation (min): 55 min Therapy/Group: Individual Therapy  Precautions: Precautions Precautions: Fall Precaution Comments: Cervical precautions Required Braces or Orthoses: Yes Cervical Brace: Hard collar;Applied in supine position Restrictions Weight Bearing Restrictions: No   Skilled Therapeutic Interventions/Progress Updates:    ADL retraining including LB bathing and dressing supine with HOB elevated.  Pt able to assist with pulling pants up and rolling side to side using bed rails.  Pt continues to require min verbal cues for assistance with positioning BLE prior to rolling.  Pt uses leg loops to move BLE over edge of bed in preparation for sitting EOB.  Pt continues to required min verbal cues for BUE positioning during side lying to sitting EOB.  Pt completed UB bathing and dressing seated in w/c at sink.  Pt required steady assist when leaning forward in w/c to complete UB dressing tasks.  Focus on bed mobility, safety awareness, activity tolerance, and LB dsg tasks.   Pain Pain Assessment Pain Assessment: No/denies pain  2nd Session Time: 4098-1191 Individual Therapy Pt denied pain Focus on dynamic sitting balance activities sitting edge of mat with max assist and side lying to sit activities with emphasis on pushing up with BUE and BUE positioning to complete task.  Pt required min assist for pushing up from elbow to sitting position.     [name] [facility] 02/21/2011, 3:38 PM

## 2011-02-21 NOTE — Progress Notes (Signed)
Physical Therapy Note  Patient Details  Name: Ricky Bradley MRN: [id number] Date of Birth: 1933/11/01 Today's Date: 02/21/2011  11:15- 11:40 individual therapy a5/10 pain in back and neck. Pt able to perform wc set up with supervision and mod vc for technique. Sliding board transfer max assist with vc for technique. Sit to supine with min assist for rt. Leg using leg loops  And vc for technique.    [name] 02/21/2011, 12:20 PM

## 2011-02-22 LAB — GLUCOSE, CAPILLARY
Glucose-Capillary: 100 mg/dL — ABNORMAL HIGH (ref 70–99)
Glucose-Capillary: 100 mg/dL — ABNORMAL HIGH (ref 70–99)
Glucose-Capillary: 99 mg/dL (ref 70–99)
Glucose-Capillary: 121 mg/dL — ABNORMAL HIGH (ref 70–99)

## 2011-02-22 LAB — URINALYSIS, ROUTINE W REFLEX MICROSCOPIC
Bilirubin Urine: NEGATIVE
Ketones, ur: NEGATIVE mg/dL
Nitrite: NEGATIVE
Protein, ur: NEGATIVE mg/dL
Urobilinogen, UA: 0.2 mg/dL (ref 0.0–1.0)
pH: 6 (ref 5.0–8.0)

## 2011-02-22 LAB — URINE MICROSCOPIC-ADD ON

## 2011-02-22 MED ORDER — WHITE PETROLATUM GEL
Status: AC
  Filled 2011-02-22: qty 5

## 2011-02-22 MED ORDER — INSULIN GLARGINE 100 UNIT/ML ~~LOC~~ SOLN
10.0000 [IU] | SUBCUTANEOUS | Status: DC
  Administered 2011-02-23: 10 [IU] via SUBCUTANEOUS
  Filled 2011-02-22: qty 3

## 2011-02-22 NOTE — Progress Notes (Signed)
Patient voided >248mL urine into the urinal and some into the brief.  Patient bladder scanned for .  Patient complaining of pain in the bladder.  Notified Deatra Ina, PA of the pain and was advised to in and out catheterize the patient to alleviate the pain.  Patient in and out catheterized for .  Will continue to monitor.

## 2011-02-22 NOTE — Progress Notes (Signed)
Patient voided >159mL urine, some urine spilled out of urinal.  PVR=375mL.  Patient complaining of urgency and discomfort in bladder; in and out catheterized using a coude catheter for .  Will continue to monitor.

## 2011-02-22 NOTE — Progress Notes (Signed)
Physical Therapy Note  Patient Details  Name: Ricky Bradley MRN: [id number] Date of Birth: 1933/09/25 Today's Date: 02/22/2011  10:00-11:15 individual therapy pt denied pain. Supine to sit supervision with leg loops, rails, and HOB slightly raised. vc needed for problem solving and technique. Sitting balance supervision with 2-1 UE support with 1 LOB when letting go. Sliding board transfer with mod assist with vc to twist hips. wc mobility 150' mod I. Pt required min assist to manage leg rest and getting legs on legrest with leg loops.   [name] 02/22/2011, 12:48 PM

## 2011-02-22 NOTE — Progress Notes (Signed)
Occupational Therapy Note  Patient Details  Name: Ricky Bradley MRN: [id number] Date of Birth: 1933-05-24 Today's Date: 02/22/2011  1400-1445 - 45 Minutes Individual Therapy No complaints of pain  Patient found supine in bed. Engaged in bed mobility; supine -> sit with minimal assist and patient using leg loops to sit edge of bed. Also engaged in edge of bed -> w/c slide board transfer with moderate assist from therapist (50%/50%). Patient then propelled self -> therapy gym for therapeutic exercise focusing on UE/UB strengthening, overall activity tolerance/endurance, and core/trunk strengthening/control.   Ticia Virgo 02/22/2011, 3:01 PM

## 2011-02-22 NOTE — Progress Notes (Signed)
Physical Therapy Note  Patient Details  Name: Ricky Bradley MRN: [id number] Date of Birth: 10-13-1933 Today's Date: 02/22/2011  Individual therapy 1500-1530 (30 minutes) Premedicated for pain. W/c propulsion on unit > 150' for general upper extremity strengthening and activity tolerance. Transfer training with slideboard, pt directing placement and technique, overall modA and practiced bed mobility with use of leg loops overall minA and extra time. Bilateral lower extremity stretching and ROM in supine. Pt noted with ability to initiate left dorsiflexion against gravity and knee extension against gravity through limited range. 1+/5 quad contraction noted right and left. Able to wiggle toes and initiate dorsiflexion on right.  Karolee Stamps [facility] 02/22/2011, 4:27 PM

## 2011-02-22 NOTE — Progress Notes (Cosign Needed Addendum)
Occupational Therapy Session Note  Patient Details  Name: Ricky Bradley MRN: [id number] Date of Birth: 02-19-1933  Today's Date: 02/22/2011  Session Note: Time: 1115-1200 Time Calculation (min): 45 min    Therapy Documentation Precautions: Precautions Precautions: Fall Precaution Comments: Cervical precautions Required Braces or Orthoses: Yes Cervical Brace: Hard collar;Applied in supine position Restrictions Weight Bearing Restrictions: No  Skilled Therapeutic Interventions/Progress Updates:  Upon arrival, pt in w/c. Pt completed UB bathing and dressing seated in w/c at sink.  Pt performs all UB tasks with setup and supervision.  Pt completed w/c to bed sliding board transfer at max A with pt initiating trunk rotation to facilitate movement of hips during transfer.  Pt declined LB bathing and dressing.  Pt assisted therapist with scooting up in bed using BUE to pull self up with head board.  Focus on bed mobility, activity tolerance, and sitting balance in w/c and EOB.  Pain Pain Assessment Pain Assessment: No/denies pain  Therapy/Group: Individual Therapy   Weekly Note Pt continues to exhibit steady progress with BADLs, BUE strength, FM skills,and bed mobility.Pt is supervision for UB bathing and dressing with supported sitting in w/c, mod A for LB bathing supine with HOB elevated, and tot A for LB dressing supine with HOB elevated. Pt is total A for sliding board transfer to drop arm BSC and toileting.  Pt is able to bathe bilateral lower legs after assistance to cross legs.  Pt is able to pull pants up with rolling side to side in bed after pants have been threaded over feet and pulled up to knees.  Pt is able to position legs off side of bed using leg loops in preparation for supine to sit and transfers with sliding board.  [name] [facility] 02/22/2011, 2:41 PM

## 2011-02-22 NOTE — Progress Notes (Signed)
Patient ID: Ricky Bradley, male   DOB: 1933/09/01, 78 y.o.   MRN: [id number] Patient ID: Ricky Bradley, male   DOB: 1933-03-16, 78 y.o.   MRN: [id number] Subjective/Complaints: Voided more on his own , but there were definite, associated spasms as well. 1/9 Review of Systems  HENT: Positive for neck pain.   Musculoskeletal: Positive for back pain.  All other systems reviewed and are negative.   Objective: Vital Signs: Blood pressure 100/65, pulse 80, temperature 97.4 F (36.3 C), temperature source Oral, resp. rate 18, weight 73.5 kg (162 lb 0.6 oz), SpO2 99.00%. No results found. Results for orders placed during the hospital encounter of 01/27/11 (from the past 72 hour(s))  GLUCOSE, CAPILLARY     Status: Abnormal   Collection Time   02/19/11 11:32 AM      Component Value Range Comment   Glucose-Capillary 130 (*) 70 - 99 (mg/dL)    Comment 1 Notify RN     GLUCOSE, CAPILLARY     Status: Abnormal   Collection Time   02/20/11  6:28 AM      Component Value Range Comment   Glucose-Capillary 116 (*) 70 - 99 (mg/dL)   GLUCOSE, CAPILLARY     Status: Normal   Collection Time   02/20/11  7:24 AM      Component Value Range Comment   Glucose-Capillary 99  70 - 99 (mg/dL)   GLUCOSE, CAPILLARY     Status: Normal   Collection Time   02/20/11 11:08 AM      Component Value Range Comment   Glucose-Capillary 88  70 - 99 (mg/dL)    Comment 1 Notify RN     GLUCOSE, CAPILLARY     Status: Abnormal   Collection Time   02/20/11  4:37 PM      Component Value Range Comment   Glucose-Capillary 133 (*) 70 - 99 (mg/dL)    Comment 1 Notify RN     GLUCOSE, CAPILLARY     Status: Abnormal   Collection Time   02/20/11  9:24 PM      Component Value Range Comment   Glucose-Capillary 103 (*) 70 - 99 (mg/dL)   GLUCOSE, CAPILLARY     Status: Abnormal   Collection Time   02/21/11  5:55 AM      Component Value Range Comment   Glucose-Capillary 108 (*) 70 - 99 (mg/dL)   GLUCOSE, CAPILLARY     Status: Normal   Collection  Time   02/21/11  7:21 AM      Component Value Range Comment   Glucose-Capillary 83  70 - 99 (mg/dL)    Comment 1 Notify RN     GLUCOSE, CAPILLARY     Status: Normal   Collection Time   02/21/11 11:42 AM      Component Value Range Comment   Glucose-Capillary 84  70 - 99 (mg/dL)    Comment 1 Notify RN     GLUCOSE, CAPILLARY     Status: Abnormal   Collection Time   02/21/11  4:24 PM      Component Value Range Comment   Glucose-Capillary 170 (*) 70 - 99 (mg/dL)    Comment 1 Notify RN     GLUCOSE, CAPILLARY     Status: Abnormal   Collection Time   02/21/11  8:47 PM      Component Value Range Comment   Glucose-Capillary 170 (*) 70 - 99 (mg/dL)    Comment 1 Notify RN  GLUCOSE, CAPILLARY     Status: Abnormal   Collection Time   02/22/11  6:20 AM      Component Value Range Comment   Glucose-Capillary 100 (*) 70 - 99 (mg/dL)   GLUCOSE, CAPILLARY     Status: Abnormal   Collection Time   02/22/11  7:18 AM      Component Value Range Comment   Glucose-Capillary 100 (*) 70 - 99 (mg/dL)    Comment 1 Notify RN      Physical Exam  Constitutional: He is oriented to person, place, and time. Appears fatigued HENT:  Head: Normocephalic.  Neck:  Collar in place  Cardiovascular: Normal rate.  Pulmonary/Chest: Breath sounds normal. He has no wheezes.  Abdominal: He exhibits no distension. There is no tenderness. Bowel sounds are active. Musculoskeletal: He exhibits no edema.  Neurological: He is alert and oriented to person, place, and time. A sensory deficit is present. Coordination reduced in BUEs Patient with 4/5 strength in R upper extremity, 3/5 on Left. HI are  3+/5.  In the lower extremities strength was tr/5 proximally at the right hip flexors 1-tr at the right knee extensors and flexors and to tr-1/5 right. Left leg 1+to3/5. R ADF/APF tr-1/5. Prox RLE 1/5.  continued leg sensation at 1 plus out of 2. Fine motor coordination of the upper extremities was fair. Cognitively he was alert and oriented  x 3 with improved awareness and insight. CN exam unremarkable 2-12. DTR's 2+ in lower ext's Skin: Surgical site clean. C-collar fitting appropriately. Erythema and mild breakdown at sacrum with EPBC and stable.  Psychiatric: pleasant and very alert.  In good spirits.   1/9 exam    Assessment/Plan: 1. Functional deficits secondary to Cervical myelopathy causing tetraplegia C7 ASIA C which require 3+ hours per day of interdisciplinary therapy in a comprehensive inpatient rehab setting. Physiatrist is providing close team supervision and 24 hour management of active medical problems listed below. Physiatrist and rehab team continue to assess barriers to discharge/monitor patient progress toward functional and medical goals.  Mobility: Bed Mobility Bed Mobility: Yes Rolling Right: 3: Mod assist Rolling Right Details (indicate cue type and reason): vc on how to use leg loops Rolling Left: 3: Mod assist Rolling Left Details (indicate cue type and reason): Cues for UE use, A for bil LEs Left Sidelying to Sit: 1: +1 Total assist Left Sidelying to Sit Details (indicate cue type and reason): Pt able to A with bil UEs, but A needed for trunk and LEs Sitting - Scoot to Edge of Bed: 2: Max assist Sitting - Scoot to [employer] of Bed Details (indicate cue type and reason): Cues for hand placement and timing, A for anterior translation at hips Sit to Supine - Right (DO NOT USE): 1: +2 Total assist;HOB flat Scooting to HOB: 1: +2 Total assist Scooting to [facility] Details (indicate cue type and reason): A to position LEs in hooklying and cues to lift hips. A for scooting Transfers Transfers: Yes Sit to Stand: 1: +2 Total assist;With upper extremity assist;From bed Sit to Stand Details (indicate cue type and reason): A needed for lifting and lowering. Pt unable to clear hps from bed without +2 A. Tried Sara Plus, but increased thoracic pain, so stopped.  Stand to Sit: 1: +2 Total assist;With upper extremity  assist;To bed Ambulation/Gait Ambulation/Gait Assistance: Not tested (comment) Stairs: No Wheelchair Mobility Wheelchair Mobility: Yes Wheelchair Assistance: 5: Investment banker, operational: Both upper extremities Wheelchair Parts Management: Needs assistance Distance: 150  ADL:  Cognition: Cognition Overall Cognitive Status: Appears within functional limits for tasks assessed Arousal/Alertness: Awake/alert Orientation Level: Oriented X4 Attention: Sustained Sustained Attention: Appears intact Memory: Appears intact Awareness: Appears intact Problem Solving: Appears intact Safety/Judgment: Appears intact Cognition Arousal/Alertness: Awake/alert Orientation Level: Oriented X4 Medical problem list  1.Cervical Myelopathy-S/Pcervical diskectomy with decompression 12/3.Cervical collar at all times.Decadron taper  2. DVT Prophylaxis/Anticoagulation: SCD/support hose.No current signs of DVT. Dopplers are negative.   3. Pain Management: Oxycodone/robaxin.Monitor with increased activity.    4. Neurogenic bladder and hematuria/BPH-.Continue flomax for now.  -had some encouraging voluntary voiding yesterday.  -pt wishes to continue voiding and cathing trial.  Need to keep volumes less than 450 cc  -will trial urecholine, but it may make spasms worse and may excessively increased bladder pressures.  -could use ditropan for bladder pain/spasm but this would decrease voluntary ability to void.  -i'm afraid that BPH is more than his bladder musculature can overcome.   5. Neurogenic bowel/bladder: -am bowel  Program  -fibercon and dulcolax  -increased florastor  -explained to patient that some of pain is likely neurologic/sensory loss.  -lacks sphincter tone which doesn't help things  -is on gi prophylaxis  -decadron decreased to 0.5mg  bid---will stop today.  -stopped fiber due to frequent stools.   5.Steriod induced hyperglycemia-check blood sugars ac/hs and monitor as  taper steroids. Cover with SSI and rx UTI.  Sugars are improving. Decrease cbg checks  6.Depression-celexa daily.Provide emotional support. i see nice improvement here.    7.Dysphagia-  SLP so no major issues.  Work on positioning, pace, technique.   8.Hyperlipidemia-zocor   9. FEN--eating well.  Pt wishes to stay on gluten free diet as it seems to be working for him.  26  Mescal Flinchbaugh T 02/22/2011, 8:02 AM

## 2011-02-22 NOTE — Progress Notes (Addendum)
INITIAL ADULT NUTRITION ASSESSMENT Date: 02/22/2011   Time: 3:43 PM  Reason for Assessment: MD consult  ASSESSMENT: Male 78 y.o.  Dx: Myelopathy  Hx:  Past Medical History  Diagnosis Date  . Hypertension   . GERD (gastroesophageal reflux disease)   . High cholesterol   . Enlarged prostate   . High cholesterol    Related Meds:     . bethanechol  25 mg Oral TID  . bisacodyl  10 mg Rectal Daily  . citalopram  10 mg Oral Daily  . hydrocortisone   Rectal TID  . insulin aspart  0-9 Units Subcutaneous Q0600  . insulin glargine  10 Units Subcutaneous Q0700  . loratadine  10 mg Oral Daily  . pantoprazole  40 mg Oral BID AC  . saccharomyces boulardii  500 mg Oral BID  . simethicone  80 mg Oral TID AC & HS  . simvastatin  20 mg Oral q1800  . Tamsulosin HCl  0.4 mg Oral BID  . DISCONTD: dexamethasone  0.5 mg Oral Q12H  . DISCONTD: insulin glargine  20 Units Subcutaneous Q0700   Ht:  6' (182.9 cm)  Wt: 158 lb 1.1 oz (71.7 kg)  Ideal Wt:    80.9 kg % Ideal Wt: 89%  Usual Wt: 189 lb 2 oz (85.8 kg) per 11/30/10 office visit % Usual Wt: 84%  BMI 21.4, WNL  Food/Nutrition Related Hx: pt with self-restricting diet PTA per staff  Labs:  CMP     Component Value Date/Time   NA 133* 02/10/2011 0650   K 3.4* 02/10/2011 0650   CL 98 02/10/2011 0650   CO2 24 02/10/2011 0650   GLUCOSE 106* 02/10/2011 0650   BUN 18 02/10/2011 0650   CREATININE 0.58 02/10/2011 0650   CALCIUM 8.4 02/10/2011 0650   PROT 5.2* 01/30/2011 0600   ALBUMIN 2.6* 01/30/2011 0600   AST 10 01/30/2011 0600   ALT 20 01/30/2011 0600   ALKPHOS 67 01/30/2011 0600   BILITOT 0.5 01/30/2011 0600   GFRNONAA >90 02/10/2011 0650   GFRAA >90 02/10/2011 0650   Intake/Output: I/O last 3 completed shifts: In: 840 [P.O.:840] Out: 2700 [Urine:2700] Total I/O In: 840 [P.O.:840] Out: 300 [Urine:300]  Diet Order:   Gluten - Free  Supplements/Tube Feeding: none  IVF:    Estimated Nutritional Needs:   Kcal:  1700 - 1800 Protein:  90 - 105 g Fluid:  1.7 - 1.8 L/d  Pt with good appetite. Eating 80-100%. RD contacted by social work regarding patient's specific dietary needs for discharge purposes. Per staff, pt was following Gluten- and Lactose-Free diet PTA, and pt self-restricts diet, may not be medically warranted. These diet needs have been met during inpatient rehab stay. Pt appears to be tolerating diet well. Per chart review, pt with 16% wt loss PTA, however since rehab admission weights have been stable and intake appropriate.  NUTRITION DIAGNOSIS: None at this time  MONITORING/EVALUATION(Goals): Goal: Pt to consume >/= 75% of meals. Goal met. Monitor: PO intake, weights, labs, I/O's, dietary needs  EDUCATION NEEDS: -Education needs addressed  INTERVENTION: 1. Provided Gluten- and Lactose-Free Nutrition Therapy handouts for family members.  2. Intake is appropriate at this time and per chart, weights have been stable. No supplements warranted at this time.  Dietitian #: [phone]  DOCUMENTATION CODES Per approved criteria  -Not Applicable    Adair Laundry 02/22/2011, 3:43 PM

## 2011-02-22 NOTE — Progress Notes (Signed)
Occupational Therapy Weekly Progress Note  Patient Details  Name: Ricky Bradley MRN: [id number] Date of Birth: [birth year]  Today's Date: 02/22/2011  Weekly Note  Pt continues to exhibit steady progress with BADLs, BUE strength, FM skills,and bed mobility. Pt is supervision for UB bathing and dressing with supported sitting in w/c, mod A for LB bathing supine with HOB elevated, and tot A for LB dressing supine with HOB elevated. Pt is total A for sliding board transfer to drop arm BSC and toileting. Pt is able to bathe bilateral lower legs after assistance to cross legs. Pt is able to pull pants up with rolling side to side in bed after pants have been threaded over feet and pulled up to knees. Pt is able to position legs off side of bed using leg loops in preparation for supine to sit and transfers with sliding board.  Patient progressing toward LTGs. Plan is for patient to discharge -> SNF when bed becomes available.    Diane Hanel 02/22/2011, 3:53 PM

## 2011-02-23 LAB — GLUCOSE, CAPILLARY: Glucose-Capillary: 86 mg/dL (ref 70–99)

## 2011-02-23 NOTE — Discharge Summary (Signed)
Ricky Bradley, Ricky Bradley NO.:  [id number]  MEDICAL RECORD NO.:  [id number]  LOCATION:  4029                         FACILITY:  [facility]  PHYSICIAN:  Ricky Bradley, M.D.DATE OF BIRTH:  08-Jan-1934  DATE OF ADMISSION:  01/27/2011 DATE OF DISCHARGE:  02/24/2011                              DISCHARGE SUMMARY   DISCHARGE DIAGNOSES: 1. Cervical stenosis with myelopathy. 2. Sequential compression devices for deep vein thrombosis     prophylaxis, pain management, neurogenic bladder as well as     neurogenic bowel, hypertension, depression, hyperlipidemia.  This is a 78 year old right-handed white male admitted November 25 with bilateral lower extremity weakness that progressively worsened.  The patient with similar episode in May 2012 when he was seen by Neurosurgery and underwent lumbar laminectomy for spinal stenosis.  MRI of cervical spine showed severe stenosis with cervical C6, cervical C7 with myelopathy.  The patient underwent cervical C6-7 anterior cervical diskectomy with decompression on December 3 by Dr. Tressie Bradley of Neurosurgery.  He was fitted with a cervical collar to be worn at all times.  Decadron protocol initiated.  Postoperative urinary retention with some hematuria.  He was seen by urology services, a Foley catheter tube was inserted and placed on Flomax.  He had recently been placed on Keflex for empiric coverage for suspect of urinary tract infection. Noted December 10 the patient to be quadriparetic with severe weakness in his hands.  The patient had been scheduled to go to inpatient rehab services but due to with increased weakness, Neurosurgery was again reconsulted, underwent MRI of cervical spine showing cervical epidural hematoma.  Underwent evacuation of cervical epidural hematoma on December 10 by Dr. Lovell Bradley.  He remained on Decadron therapy.  Close monitoring of blood sugars felt to be induced by steroids with low-dose Lantus insulin  added.  HOSPITAL COURSE:  Foley catheter tube removed for a voiding trial with noted elevated postvoid residuals.  Tube again was reinserted at that time.  He was currently required total assist for mobility.  He was admitted for comprehensive rehab program.  PAST MEDICAL HISTORY:  See discharge diagnoses.  ALLERGIES:  VICODIN.  SOCIAL HISTORY:  Lives with spouse, 1 level home, 3 steps to entry.  Functional history prior to admission.  He was independent with basic activities of daily living and homemaking requiring assistive device for independence.  Functional status upon admission to rehab services was +2 total assist to roll, +2 total assist sit to supine, +2 total assist for transfers.  PHYSICAL EXAMINATION:  VITAL SIGNS:  Blood pressure 129/82, pulse 78, temperature 97.4, respirations 18. GENERAL:  This is an alert male, in no acute distress, oriented x3, well developed. LUNGS:  Clear to auscultation. CARDIAC:  Regular rate and rhythm. ABDOMEN:  Soft, nontender.  Good bowel sounds. EXTREMITIES:  He was hyporeflexic.  He was 2/5 with wrist extension, 1/5 for hand intrinsic muscle, 4/5 biceps and triceps as well as shoulders. Lower extremities are notable for 0 to trace motor function. SKIN:  Surgical site was clean and dry.  REHABILITATION HOSPITAL COURSE:  The patient was admitted to inpatient rehab services with therapies initiated on a 3-hour daily basis  consisting of physical therapy, occupational therapy, and rehabilitation nursing.  The following issues were addressed during the patient's rehabilitation stay.  Pertaining to Ricky Bradley's cervical stenosis with myelopathy, he had undergone cervical diskectomy with decompression as well as postoperative epidural hematoma with evacuation of hematoma on December 10 per Dr. Lovell Bradley.  His cervical collar remained in place.  He had been weaned from his Decadron.  Lower extremity Dopplers completed showing no signs of deep  vein thrombosis.  He was wearing sequential compression devices for deep vein thrombosis prophylaxis.  Pain management with the use of oxycodone and good results with the use on limited basis.  Noted neurogenic bowel and bladder.  Foley catheter tube had been removed.  He was having some voluntary voiding, still on demand of intermittent catheterizations, trying to keep volumes less than 450. Urecholine was added to his regimen as well as also continuing on Flomax 0.4 mg twice daily.  Blood pressures were well controlled without the use of antihypertensive medications at this time.  Close monitoring for orthostasis.  Follow up with therapies.  Initially had some elevated blood sugars.  Felt to be induced from his steroids since been tapered and blood sugars normalized.  He had been placed on Celexa for depression.  Long, complicated hospital course with emotional support provided.  The patient received weekly collaborative interdisciplinary team conferences to discuss estimated length of stay, family teaching, and any barriers to discharge.  He was overall total assist for activities of daily living, +2 person for sliding board transfers.  The patient with noted slow overall progressive gains during his rehab stay. Family did not feel they could provide the necessary support at home. All issues in regards to skilled nursing facility were discussed with the patient and family both being accepting of and bed becoming available on January 11, discharge taking place at that time.  DISCHARGE MEDICATIONS: 1. Urecholine 25 mg p.o. t.i.d. 2. Celexa 10 mg p.o. daily. 3. Valium 5 mg every 6 hours as needed, spasms. 4. Anusol cream 3 times daily. 5. Claritin 10 mg p.o. daily. 6. Robaxin 500 mg every 6 hours as needed for spasms. 7. Oxycodone immediate release 5 mg 1 tablet every 4 hours as needed     for pain. 8. Protonix 40 mg p.o. b.i.d. 9. MiraLax 17 g daily p.o. with 8 ounce of water as  needed,     constipation. 10.Florastor 500 mg p.o. b.i.d. 11.Mylicon 80 mg 3 times daily. 12.Zocor 20 mg p.o. daily. 13.Flomax 0.4 mg p.o. b.i.d.  SPECIAL INSTRUCTIONS:  Continue cervical collar at all times.  His diet was lactose-free low-fat gluten free.  The patient should follow up Dr. Tressie Bradley, neurosurgery, [phone].  Call for appointment Dr. Faith Rogue, the outpatient rehab service office as directed.     Mariam Dollar, P.A.   ______________________________ Ricky Bradley, M.D.    DA/MEDQ  D:  02/23/2011  T:  02/23/2011  Job:  [id number]  cc:   Ricky Bradley, M.D. Cristi Loron, M.D.

## 2011-02-23 NOTE — Progress Notes (Signed)
Physical Therapy Note  Patient Details  Name: Ricky Bradley MRN: [id number] Date of Birth: January 23, 1934 Today's Date: 02/23/2011  1445-1600 (75 minutes) individual treatment session Pain : no complaint of pain this PM Focus of treatment; Bed mobility training with bilateral leg loops; therapeutic activities to improve dynamic sitting balance Treatment: Bed mobility: supine to sit with bilateral leg loops SBA hospital bed with head of bed raised to maximum level Sitting balance - pt maintains static sitting balance with close supervision with unilateral UE support on firm surface; side to sit progression using wedges- pt able to push up from flat surface X 3 from rt or lt side with difficulty; pt performed unilateral arm raises with close supervision to min assist; lliftling ball from lap min assist Other: bridging mod assist (scooting side to side); hooklying hip ER/IR   Garrison Michie,JIM 02/23/2011, 3:50 PM

## 2011-02-23 NOTE — Progress Notes (Signed)
Social Work   Have received SNF offer from [facility] and can accept pt tomorrow.  Pt and daughter are aware and agreeable.  Both tearful about leaving CIR and the treatment team.  Pt states, "but I know I need to go and just need to take it one day at a time". Provided emotional support to both and will follow up again in the a.m.  Ricky Bradley

## 2011-02-23 NOTE — Progress Notes (Signed)
Patient ID: Ricky Bradley, male   DOB: [birth year], 78 y.o.   MRN: [id number] Patient ID: Ricky Bradley, male   DOB: 1933-06-10, 78 y.o.   MRN: [id number] Patient ID: Ricky Bradley, male   DOB: 1933-03-21, 78 y.o.   MRN: [id number] Subjective/Complaints: Review of Systems  HENT: Positive for neck pain.   Musculoskeletal: Positive for back pain.  All other systems reviewed and are negative.  voided on his own.  Still some spasms.  i don't see that urine volumes were consistently recorded. 1/11 Objective: Vital Signs: Blood pressure 107/70, pulse 78, temperature 98.3 F (36.8 C), temperature source Oral, resp. rate 18, weight 71.7 kg (158 lb 1.1 oz), SpO2 96.00%. No results found. Results for orders placed during the hospital encounter of 01/27/11 (from the past 72 hour(s))  GLUCOSE, CAPILLARY     Status: Normal   Collection Time   02/20/11 11:08 AM      Component Value Range Comment   Glucose-Capillary 88  70 - 99 (mg/dL)    Comment 1 Notify RN     GLUCOSE, CAPILLARY     Status: Abnormal   Collection Time   02/20/11  4:37 PM      Component Value Range Comment   Glucose-Capillary 133 (*) 70 - 99 (mg/dL)    Comment 1 Notify RN     GLUCOSE, CAPILLARY     Status: Abnormal   Collection Time   02/20/11  9:24 PM      Component Value Range Comment   Glucose-Capillary 103 (*) 70 - 99 (mg/dL)   GLUCOSE, CAPILLARY     Status: Abnormal   Collection Time   02/21/11  5:55 AM      Component Value Range Comment   Glucose-Capillary 108 (*) 70 - 99 (mg/dL)   GLUCOSE, CAPILLARY     Status: Normal   Collection Time   02/21/11  7:21 AM      Component Value Range Comment   Glucose-Capillary 83  70 - 99 (mg/dL)    Comment 1 Notify RN     GLUCOSE, CAPILLARY     Status: Normal   Collection Time   02/21/11 11:42 AM      Component Value Range Comment   Glucose-Capillary 84  70 - 99 (mg/dL)    Comment 1 Notify RN     GLUCOSE, CAPILLARY     Status: Abnormal   Collection Time   02/21/11  4:24 PM      Component Value  Range Comment   Glucose-Capillary 170 (*) 70 - 99 (mg/dL)    Comment 1 Notify RN     GLUCOSE, CAPILLARY     Status: Abnormal   Collection Time   02/21/11  8:47 PM      Component Value Range Comment   Glucose-Capillary 170 (*) 70 - 99 (mg/dL)    Comment 1 Notify RN     GLUCOSE, CAPILLARY     Status: Abnormal   Collection Time   02/22/11  6:20 AM      Component Value Range Comment   Glucose-Capillary 100 (*) 70 - 99 (mg/dL)   GLUCOSE, CAPILLARY     Status: Abnormal   Collection Time   02/22/11  7:18 AM      Component Value Range Comment   Glucose-Capillary 100 (*) 70 - 99 (mg/dL)    Comment 1 Notify RN     GLUCOSE, CAPILLARY     Status: Normal   Collection Time   02/22/11 11:19 AM  Component Value Range Comment   Glucose-Capillary 99  70 - 99 (mg/dL)    Comment 1 Notify RN     URINALYSIS, ROUTINE W REFLEX MICROSCOPIC     Status: Abnormal   Collection Time   02/22/11  1:38 PM      Component Value Range Comment   Color, Urine YELLOW  YELLOW     APPearance CLEAR  CLEAR     Specific Gravity, Urine 1.008  1.005 - 1.030     pH 6.0  5.0 - 8.0     Glucose, UA NEGATIVE  NEGATIVE (mg/dL)    Hgb urine dipstick TRACE (*) NEGATIVE     Bilirubin Urine NEGATIVE  NEGATIVE     Ketones, ur NEGATIVE  NEGATIVE (mg/dL)    Protein, ur NEGATIVE  NEGATIVE (mg/dL)    Urobilinogen, UA 0.2  0.0 - 1.0 (mg/dL)    Nitrite NEGATIVE  NEGATIVE     Leukocytes, UA NEGATIVE  NEGATIVE    URINE MICROSCOPIC-ADD ON     Status: Abnormal   Collection Time   02/22/11  1:38 PM      Component Value Range Comment   WBC, UA 0-2  <3 (WBC/hpf)    RBC / HPF 0-2  <3 (RBC/hpf)    Bacteria, UA FEW (*) RARE    GLUCOSE, CAPILLARY     Status: Abnormal   Collection Time   02/22/11  4:38 PM      Component Value Range Comment   Glucose-Capillary 147 (*) 70 - 99 (mg/dL)    Comment 1 Notify RN     GLUCOSE, CAPILLARY     Status: Abnormal   Collection Time   02/22/11 10:10 PM      Component Value Range Comment   Glucose-Capillary 121  (*) 70 - 99 (mg/dL)   GLUCOSE, CAPILLARY     Status: Normal   Collection Time   02/23/11  5:36 AM      Component Value Range Comment   Glucose-Capillary 86  70 - 99 (mg/dL)    Physical Exam  Constitutional: He is oriented to person, place, and time. Appears fatigued HENT:  Head: Normocephalic.  Neck:  Collar in place  Cardiovascular: Normal rate.  Pulmonary/Chest: Breath sounds normal. He has no wheezes.  Abdominal: He exhibits no distension. There is no tenderness. Bowel sounds are active. Musculoskeletal: He exhibits no edema.  Neurological: He is alert and oriented to person, place, and time. A sensory deficit is present. Coordination reduced in BUEs Patient with 4/5 strength in R upper extremity, 3/5 on Left. HI are  3+/5.  In the lower extremities strength was tr/5 proximally at the right hip flexors 1-tr at the right knee extensors and flexors and to tr-1/5 right. Left leg 1+to3/5. R ADF/APF tr-1/5. Prox RLE 1/5.  continued leg sensation at 1 plus out of 2. Fine motor coordination of the upper extremities was fair. Cognitively he was alert and oriented x 3 with improved awareness and insight. CN exam unremarkable 2-12. DTR's 2+ in lower ext's.  Has difficulty utilizing UE's for txfr to bedside commode Skin: Surgical site clean. C-collar fitting appropriately. Erythema and mild breakdown at sacrum with EPBC and stable.  Psychiatric: pleasant and very alert.  In good spirits.   1/10 exam updated    Assessment/Plan: 1. Functional deficits secondary to Cervical myelopathy causing tetraplegia C7 ASIA C which require 3+ hours per day of interdisciplinary therapy in a comprehensive inpatient rehab setting. Physiatrist is providing close team supervision and 24 hour management  of active medical problems listed below. Physiatrist and rehab team continue to assess barriers to discharge/monitor patient progress toward functional and medical goals.  Mobility: Bed Mobility Bed Mobility:  Yes Rolling Right: 3: Mod assist Rolling Right Details (indicate cue type and reason): vc on how to use leg loops Rolling Left: 3: Mod assist Rolling Left Details (indicate cue type and reason): Cues for UE use, A for bil LEs Left Sidelying to Sit: 1: +1 Total assist Left Sidelying to Sit Details (indicate cue type and reason): Pt able to A with bil UEs, but A needed for trunk and LEs Sitting - Scoot to Edge of Bed: 2: Max assist Sitting - Scoot to [employer] of Bed Details (indicate cue type and reason): Cues for hand placement and timing, A for anterior translation at hips Sit to Supine - Right (DO NOT USE): 1: +2 Total assist;HOB flat Scooting to HOB: 1: +2 Total assist Scooting to [facility] Details (indicate cue type and reason): A to position LEs in hooklying and cues to lift hips. A for scooting Transfers Transfers: Yes Sit to Stand: 1: +2 Total assist;With upper extremity assist;From bed Sit to Stand Details (indicate cue type and reason): A needed for lifting and lowering. Pt unable to clear hps from bed without +2 A. Tried Sara Plus, but increased thoracic pain, so stopped.  Stand to Sit: 1: +2 Total assist;With upper extremity assist;To bed Ambulation/Gait Ambulation/Gait Assistance: Not tested (comment) Stairs: No Wheelchair Mobility Wheelchair Mobility: Yes Wheelchair Assistance: 5: Investment banker, operational: Both upper extremities Wheelchair Parts Management: Needs assistance Distance: 150  ADL:    Cognition: Cognition Overall Cognitive Status: Appears within functional limits for tasks assessed Arousal/Alertness: Awake/alert Orientation Level: Oriented X4 Attention: Sustained Sustained Attention: Appears intact Memory: Appears intact Awareness: Appears intact Problem Solving: Appears intact Safety/Judgment: Appears intact Cognition Arousal/Alertness: Awake/alert Orientation Level: Oriented X4 Medical problem list  1.Cervical Myelopathy-S/Pcervical diskectomy  with decompression 12/3.Cervical collar at all times.Decadron taper  2. DVT Prophylaxis/Anticoagulation: SCD/support hose.No current signs of DVT. Dopplers are negative.   3. Pain Management: Oxycodone/robaxin.Monitor with increased activity.    4. Neurogenic bladder and hematuria/BPH-.Continue flomax for now.  -had some encouraging voluntary voiding yesterday.  -pt wishes to continue voiding and cathing trial.  Need to keep volumes less than 450 cc. NEED TO RECORD  -will trial urecholine, but it may make spasms worse and may excessively increased bladder pressures given BPH  -could use ditropan for bladder pain/spasm but this would decrease voluntary ability to void.     5. Neurogenic bowel/bladder: -am bowel  Program  -fibercon and dulcolax  -increased florastor  -explained to patient that some of pain is likely neurologic/sensory loss.  -lacks sphincter tone which doesn't help things  -is on gi prophylaxis  -decadron decreased to 0.5mg  bid---will stop today.  -stopped fiber due to frequent stools.   5.Steriod induced hyperglycemia-check blood sugars ac/hs and monitor as taper steroids. Cover with SSI and rx UTI.  Sugars are improving. Decrease cbg checks  6.Depression-celexa daily.Provide emotional support. i see nice improvement here.    7.Dysphagia-  SLP so no major issues.  Work on positioning, pace, technique.   8.Hyperlipidemia-zocor   9. FEN--eating well.  Pt wishes to stay on gluten free diet as it seems to be working for him.  27  SWARTZ,ZACHARY T 02/23/2011, 8:59 AM

## 2011-02-23 NOTE — Progress Notes (Signed)
Physical Therapy Note  Patient Details  Name: Ricky Bradley MRN: [id number] Date of Birth: February 10, 1934 Today's Date: 02/23/2011  1100-1155 (55 minutes) individual treatment session Pain- reports intermittent pain neck or low back with movement/ premedicated Focus of treatment: bed mobility/transfer training with leg loops; standing tolerance in standing frame to improve trunk control Treatment: Pt in bed with leg loops; supine to sit with leg loops SBA to remove legs from bed; min assist side to sit with raised head of bed; Transfer - mod/max assist with sliding board Wc mobility- 150 feet SBA with no rest break X 2 Standing (standing frame): With initial c/o of mild dizziness 2 - 3 minutes X 3 with assist to attain full trunk extension; standing limited by c/o of fatigue; during standing pt instructed in glut/quad isometrics  [name],JIM 02/23/2011, 11:26 AM

## 2011-02-23 NOTE — Progress Notes (Signed)
Occupational Therapy Session Note  Patient Details  Name: Ricky Bradley MRN: [id number] Date of Birth: [birth year]  Today's Date: 02/23/2011 Time: 1000-1100 Precautions: Precautions Precautions: Fall Precaution Comments: Cervical precautions Required Braces or Orthoses: Yes Cervical Brace: Hard collar;Applied in supine position Restrictions Weight Bearing Restrictions: No  Short Term Goals: OT Short Term Goal 1: Pt. will be minimal assist with feeding self OT Short Term Goal 2: Pt. will be be set up with UB bathing OT Short Term Goal 3: Pt.  will verbalize strategies for pressur relief with minimal cues OT Short Term Goal 4: Pt.  will sit up for 2 hours daily in chair  Skilled Therapeutic Interventions/Progress Updates:   ADL retraining including LB bathing and dressing supine in bed. Pt able to bathe LE's/buttocks and thread pants after instruction from therapist on technique to assist with positioning of  legs/feet. Pt able to assist with pulling pants up and rolling side to side using bed rails. Pt continues to require min verbal cues for assistance with positioning BLE prior to rolling. Pt completed UB bathing supine in bed and dressing supine with HOB elevated. Focus on bed mobility, activity tolerance, and LB bathing/dressing.     Pain Pain Assessment Pain Assessment: 0-10 Pain Score:  (pt did not rate) Pain Type: Acute pain Pain Location: Neck Pain Orientation: Posterior Multiple Pain Sites: Yes 2nd Pain Site Pain Score:  (did not rate) Pain Location: Back    Therapy/Group: Individual Therapy  Loleta Chance 02/23/2011, 11:20 AM

## 2011-02-24 LAB — GLUCOSE, CAPILLARY: Glucose-Capillary: 107 mg/dL — ABNORMAL HIGH (ref 70–99)

## 2011-02-24 MED ORDER — OXYBUTYNIN CHLORIDE 5 MG PO TABS
2.5000 mg | ORAL_TABLET | Freq: Two times a day (BID) | ORAL | Status: DC
  Administered 2011-02-24: 2.5 mg via ORAL
  Filled 2011-02-24 (×3): qty 0.5

## 2011-02-24 NOTE — Progress Notes (Signed)
Social Work  Discharge Note  The overall goal for the admission was met for:   Discharge location: NO - plan changed to SNF due to pt's extensive care needs and benefit of longer term daily therapy availability at SNF  Length of Stay: Yes -   Discharge activity level: Yes  Home/community participation: No as plan changed to SNF  Services provided included: MD, RD, PT, OT, RN, CM, TR, Pharmacy and SW  Financial Services: Medicare and Private Insurance: [facility]  Follow-up services arranged: Other: SNF at [facility]  Comments (or additional information):  Patient/Family verbalized understanding of follow-up arrangements: Yes  Individual responsible for coordination of the follow-up plan: patient and daughter  Confirmed correct DME delivered: NA   Ricky Bradley

## 2011-02-24 NOTE — Progress Notes (Signed)
Patient ID: Ricky Bradley, male   DOB: February 24, 1933, 78 y.o.   MRN: [id number] Subjective/Complaints: Review of Systems  HENT: Positive for neck pain.   Musculoskeletal: Positive for back pain.  All other systems reviewed and are negative.  1/11 no real changes with bladder. Spasms and pain at times. Objective: Vital Signs: Blood pressure 100/63, pulse 89, temperature 97.5 F (36.4 C), temperature source Oral, resp. rate 18, weight 71.7 kg (158 lb 1.1 oz), SpO2 99.00%. No results found. Results for orders placed during the hospital encounter of 01/27/11 (from the past 72 hour(s))  GLUCOSE, CAPILLARY     Status: Normal   Collection Time   02/21/11 11:42 AM      Component Value Range Comment   Glucose-Capillary 84  70 - 99 (mg/dL)    Comment 1 Notify RN     GLUCOSE, CAPILLARY     Status: Abnormal   Collection Time   02/21/11  4:24 PM      Component Value Range Comment   Glucose-Capillary 170 (*) 70 - 99 (mg/dL)    Comment 1 Notify RN     GLUCOSE, CAPILLARY     Status: Abnormal   Collection Time   02/21/11  8:47 PM      Component Value Range Comment   Glucose-Capillary 170 (*) 70 - 99 (mg/dL)    Comment 1 Notify RN     GLUCOSE, CAPILLARY     Status: Abnormal   Collection Time   02/22/11  6:20 AM      Component Value Range Comment   Glucose-Capillary 100 (*) 70 - 99 (mg/dL)   GLUCOSE, CAPILLARY     Status: Abnormal   Collection Time   02/22/11  7:18 AM      Component Value Range Comment   Glucose-Capillary 100 (*) 70 - 99 (mg/dL)    Comment 1 Notify RN     GLUCOSE, CAPILLARY     Status: Normal   Collection Time   02/22/11 11:19 AM      Component Value Range Comment   Glucose-Capillary 99  70 - 99 (mg/dL)    Comment 1 Notify RN     URINALYSIS, ROUTINE W REFLEX MICROSCOPIC     Status: Abnormal   Collection Time   02/22/11  1:38 PM      Component Value Range Comment   Color, Urine YELLOW  YELLOW     APPearance CLEAR  CLEAR     Specific Gravity, Urine 1.008  1.005 - 1.030     pH 6.0  5.0  - 8.0     Glucose, UA NEGATIVE  NEGATIVE (mg/dL)    Hgb urine dipstick TRACE (*) NEGATIVE     Bilirubin Urine NEGATIVE  NEGATIVE     Ketones, ur NEGATIVE  NEGATIVE (mg/dL)    Protein, ur NEGATIVE  NEGATIVE (mg/dL)    Urobilinogen, UA 0.2  0.0 - 1.0 (mg/dL)    Nitrite NEGATIVE  NEGATIVE     Leukocytes, UA NEGATIVE  NEGATIVE    URINE MICROSCOPIC-ADD ON     Status: Abnormal   Collection Time   02/22/11  1:38 PM      Component Value Range Comment   WBC, UA 0-2  <3 (WBC/hpf)    RBC / HPF 0-2  <3 (RBC/hpf)    Bacteria, UA FEW (*) RARE    GLUCOSE, CAPILLARY     Status: Abnormal   Collection Time   02/22/11  4:38 PM      Component Value Range Comment  Glucose-Capillary 147 (*) 70 - 99 (mg/dL)    Comment 1 Notify RN     GLUCOSE, CAPILLARY     Status: Abnormal   Collection Time   02/22/11 10:10 PM      Component Value Range Comment   Glucose-Capillary 121 (*) 70 - 99 (mg/dL)   GLUCOSE, CAPILLARY     Status: Normal   Collection Time   02/23/11  5:36 AM      Component Value Range Comment   Glucose-Capillary 86  70 - 99 (mg/dL)   GLUCOSE, CAPILLARY     Status: Abnormal   Collection Time   02/24/11  6:29 AM      Component Value Range Comment   Glucose-Capillary 107 (*) 70 - 99 (mg/dL)    Comment 1 Notify RN      Physical Exam  Constitutional: He is oriented to person, place, and time. Appears fatigued HENT:  Head: Normocephalic.  Neck:  Collar in place  Cardiovascular: Normal rate.  Pulmonary/Chest: Breath sounds normal. He has no wheezes.  Abdominal: He exhibits no distension. There is no tenderness. Bowel sounds are active. Musculoskeletal: He exhibits no edema.  Neurological: He is alert and oriented to person, place, and time. A sensory deficit is present. Coordination reduced in BUEs Patient with 4/5 strength in R upper extremity, 3/5 on Left. HI are  3+/5.  In the lower extremities strength was tr/5 proximally at the right hip flexors 1-tr at the right knee extensors and flexors  and to tr-1/5 right. Left leg 1+to3/5. R ADF/APF tr-1/5. Prox RLE 1/5.  continued leg sensation at 1 plus out of 2. Fine motor coordination of the upper extremities was fair. Cognitively he was alert and oriented x 3 with improved awareness and insight. CN exam unremarkable 2-12. DTR's 2+ in lower ext's.  Has difficulty utilizing UE's for txfr to bedside commode.  I see no motor progress in legs. Skin: Surgical site clean. C-collar fitting appropriately. Erythema  at sacrum with [facility]: pleasant and very alert.  In good spirits.   1/11 exam updated    Assessment/Plan: 1. Functional deficits secondary to Cervical myelopathy causing tetraplegia C7 ASIA C which require 3+ hours per day of interdisciplinary therapy in a comprehensive inpatient rehab setting. Physiatrist is providing close team supervision and 24 hour management of active medical problems listed below. Physiatrist and rehab team continue to assess barriers to discharge/monitor patient progress toward functional and medical goals.   To SNF today.  Needs NS, URO, and PM&R f/u   Mobility: Bed Mobility Bed Mobility: Yes Rolling Right: 3: Mod assist Rolling Right Details (indicate cue type and reason): vc on how to use leg loops Rolling Left: 3: Mod assist Rolling Left Details (indicate cue type and reason): Cues for UE use, A for bil LEs Left Sidelying to Sit: 1: +1 Total assist Left Sidelying to Sit Details (indicate cue type and reason): Pt able to A with bil UEs, but A needed for trunk and LEs Sitting - Scoot to Edge of Bed: 2: Max assist Sitting - Scoot to [place] of Bed Details (indicate cue type and reason): Cues for hand placement and timing, A for anterior translation at hips Sit to Supine - Right (DO NOT USE): 1: +2 Total assist;HOB flat Scooting to HOB: 1: +2 Total assist Scooting to [facility] Details (indicate cue type and reason): A to position LEs in hooklying and cues to lift hips. A for  scooting Transfers Transfers: Yes Sit to Stand: 1: +2 Total  assist;With upper extremity assist;From bed Sit to Stand Details (indicate cue type and reason): A needed for lifting and lowering. Pt unable to clear hps from bed without +2 A. Tried Sara Plus, but increased thoracic pain, so stopped.  Stand to Sit: 1: +2 Total assist;With upper extremity assist;To bed Ambulation/Gait Ambulation/Gait Assistance: Not tested (comment) Stairs: No Wheelchair Mobility Wheelchair Mobility: Yes Wheelchair Assistance: 5: Investment banker, operational: Both upper extremities Wheelchair Parts Management: Needs assistance Distance: 150  ADL:    Cognition: Cognition Overall Cognitive Status: Appears within functional limits for tasks assessed Arousal/Alertness: Awake/alert Orientation Level: Oriented X4 Attention: Sustained Sustained Attention: Appears intact Memory: Appears intact Awareness: Appears intact Problem Solving: Appears intact Safety/Judgment: Appears intact Cognition Arousal/Alertness: Awake/alert Orientation Level: Oriented X4 Medical problem list  1.Cervical Myelopathy-S/Pcervical diskectomy with decompression 12/3.Cervical collar at all times.Decadron taper  2. DVT Prophylaxis/Anticoagulation: SCD/support hose.No current signs of DVT. Dopplers are negative.   3. Pain Management: Oxycodone/robaxin.Monitor with increased activity.    4. Neurogenic bladder and hematuria/BPH-.Continue flomax for now.  -had some encouraging voluntary voiding yesterday.  -will replace foley as he's going to snf today.  The facility won'Bradley be able to keep up with volumes, caths, etc as we are doing here  -needs outpt urology followup  -will start ditropan for spasms while cathether is in.   5. Neurogenic bowel/bladder: -am bowel  Program  -fibercon and dulcolax  -increased florastor  -explained to patient that some of pain is likely neurologic/sensory loss.  -lacks sphincter tone which  doesn'Bradley help things  -is on gi prophylaxis  -decadron completed.  -stopped fiber due to frequent stools.   5.Steriod induced hyperglycemia-check blood sugars ac/hs and monitor as taper steroids. Cover with SSI and rx UTI.  Sugars are improving. Decrease cbg checks  6.Depression-celexa daily.Provide emotional support. i see nice improvement here.    7.Dysphagia-  SLP so no major issues.  Work on positioning, pace, technique.   8.Hyperlipidemia-zocor   9. FEN--eating well.  Pt wishes to stay on gluten free diet as it seems to be working for him.  28  Ricky Bradley 02/24/2011, 8:34 AM

## 2011-02-24 NOTE — Progress Notes (Signed)
Physical Therapy Discharge Summary  Patient Details  Name: ZAIDYN CLAIRE MRN: [id number] Date of Birth: 1933/12/02 Today's Date: 02/24/2011  11:00- 12:15 individual therapy pt denied pain. Family education with pt's daughter for overall education on pt's progression and level of care. She observed bed mobility and sliding board transfer and was taught LE ROM. Bed mobility supervision with leg loops and vc for technique, side to sit supervision dynamic sitting balance min assist x 1 with vc for hand placement. Sliding board transfer with min assist with visual cue for thoracic rotation.    Pt initially required 2 person assist for all mobility and did not demonstrate hand grip for the ability to use leg loops or wheelchair propulsion. Currently pt has bil. Hand strength and is performing wc mobility at mod I level, bed mobility at supervision with leg loops and sliding board transfers at min to mod assist to lateral surface. Pt is performing pressure relief in the bed and wheelchair. Patient has met 5 of 5 long term goals due to improved postural control.  Patient to discharge at a wheelchair level Min Assist.   Patient's care partner requires assistance to provide the necessary physical assistance at discharge.  Recommendation:  Patient will benefit from ongoing skilled PT services in skilled nursing facility setting to continue to advance safe functional mobility, address ongoing impairments in balance, motor control, tone, activity tolerance, functional mobility, and minimize fall risk.  Equipment: equipment to be provided at [facility]  Patient/family agrees with progress made and goals achieved: Yes  [name] 02/24/2011, 9:07 AM  Tedd Sias 4:22 PM

## 2011-02-24 NOTE — Progress Notes (Signed)
Late entry for 02/23/11:   Slideboard transfe6red from bed to bedside commode with 2+ mod assist for bowel program. Extra large soft stool after dig stimulation. Incontinent of bladder x2., requiring change of clothing and soiled linen. Able to void intermittently in urinal. Pt reports inability to determine if he is voiding in urinal. States he just places it and hope that he's successful. C/o of neck discomfort. Pain med effective. Groin and scrotum red, applying microguard powder to area. Sacrum red with dry peeling skin. Extra protective barrier cream applied, and turning pt q 2hrs.

## 2011-03-24 ENCOUNTER — Other Ambulatory Visit (HOSPITAL_COMMUNITY): Payer: Self-pay | Admitting: Neurosurgery

## 2011-03-24 DIAGNOSIS — M542 Cervicalgia: Secondary | ICD-10-CM

## 2011-03-27 ENCOUNTER — Other Ambulatory Visit: Payer: Self-pay | Admitting: Neurosurgery

## 2011-03-27 ENCOUNTER — Other Ambulatory Visit (HOSPITAL_COMMUNITY): Payer: Medicare Other

## 2011-03-27 DIAGNOSIS — M542 Cervicalgia: Secondary | ICD-10-CM

## 2011-03-27 DIAGNOSIS — M4802 Spinal stenosis, cervical region: Secondary | ICD-10-CM

## 2011-04-05 ENCOUNTER — Ambulatory Visit
Admission: RE | Admit: 2011-04-05 | Discharge: 2011-04-05 | Disposition: A | Discharge status: Home / Self Care | Payer: Medicare Other | Source: Ambulatory Visit | Attending: Neurosurgery | Admitting: Neurosurgery

## 2011-04-05 DIAGNOSIS — M542 Cervicalgia: Secondary | ICD-10-CM

## 2011-04-05 DIAGNOSIS — M4802 Spinal stenosis, cervical region: Secondary | ICD-10-CM

## 2011-09-26 ENCOUNTER — Emergency Department (HOSPITAL_COMMUNITY): Payer: Medicare Other

## 2011-09-26 ENCOUNTER — Encounter (HOSPITAL_COMMUNITY): Payer: Self-pay | Admitting: Emergency Medicine

## 2011-09-26 ENCOUNTER — Emergency Department (HOSPITAL_COMMUNITY)
Admission: EM | Admit: 2011-09-26 | Discharge: 2011-09-26 | Disposition: A | Discharge status: Home / Self Care | Payer: Medicare Other | Attending: Emergency Medicine | Admitting: Emergency Medicine

## 2011-09-26 DIAGNOSIS — M48061 Spinal stenosis, lumbar region without neurogenic claudication: Secondary | ICD-10-CM | POA: Insufficient documentation

## 2011-09-26 DIAGNOSIS — I10 Essential (primary) hypertension: Secondary | ICD-10-CM | POA: Insufficient documentation

## 2011-09-26 DIAGNOSIS — Z96659 Presence of unspecified artificial knee joint: Secondary | ICD-10-CM | POA: Insufficient documentation

## 2011-09-26 DIAGNOSIS — Z79899 Other long term (current) drug therapy: Secondary | ICD-10-CM | POA: Insufficient documentation

## 2011-09-26 DIAGNOSIS — R29898 Other symptoms and signs involving the musculoskeletal system: Secondary | ICD-10-CM | POA: Insufficient documentation

## 2011-09-26 DIAGNOSIS — R209 Unspecified disturbances of skin sensation: Secondary | ICD-10-CM | POA: Insufficient documentation

## 2011-09-26 DIAGNOSIS — N400 Enlarged prostate without lower urinary tract symptoms: Secondary | ICD-10-CM | POA: Insufficient documentation

## 2011-09-26 DIAGNOSIS — K219 Gastro-esophageal reflux disease without esophagitis: Secondary | ICD-10-CM | POA: Insufficient documentation

## 2011-09-26 DIAGNOSIS — M549 Dorsalgia, unspecified: Secondary | ICD-10-CM | POA: Insufficient documentation

## 2011-09-26 DIAGNOSIS — M25559 Pain in unspecified hip: Secondary | ICD-10-CM | POA: Insufficient documentation

## 2011-09-26 DIAGNOSIS — M546 Pain in thoracic spine: Secondary | ICD-10-CM | POA: Insufficient documentation

## 2011-09-26 DIAGNOSIS — M79609 Pain in unspecified limb: Secondary | ICD-10-CM | POA: Insufficient documentation

## 2011-09-26 LAB — POCT I-STAT, CHEM 8
Calcium, Ion: 1.22 mmol/L (ref 1.13–1.30)
Chloride: 107 mEq/L (ref 96–112)
Glucose, Bld: 84 mg/dL (ref 70–99)
HCT: 42 % (ref 39.0–52.0)
Hemoglobin: 14.3 g/dL (ref 13.0–17.0)
TCO2: 24 mmol/L (ref 0–100)

## 2011-09-26 MED ORDER — PREDNISONE 20 MG PO TABS
60.0000 mg | ORAL_TABLET | Freq: Once | ORAL | Status: AC
  Administered 2011-09-26: 60 mg via ORAL
  Filled 2011-09-26: qty 3

## 2011-09-26 MED ORDER — LORAZEPAM 1 MG PO TABS
1.0000 mg | ORAL_TABLET | Freq: Once | ORAL | Status: AC
  Administered 2011-09-26: 1 mg via ORAL
  Filled 2011-09-26 (×2): qty 2

## 2011-09-26 MED ORDER — PREDNISONE (PAK) 10 MG PO TABS: ORAL_TABLET | ORAL | Status: DC

## 2011-09-26 NOTE — ED Provider Notes (Signed)
History    This chart was scribed for Rolan Bucco, MD, MD by Smitty Pluck. The patient was seen in room TR09C and the patient's care was started at 2:39PM.   CSN: [id number]  Arrival date & time 09/26/11  1205   First MD Initiated Contact with Patient 09/26/11 1416      Chief Complaint  Patient presents with  . Back Pain  . Leg Pain    (Consider location/radiation/quality/duration/timing/severity/associated sxs/prior treatment) The history is provided by the patient.   Ricky Bradley is a 78 y.o. male who presents to the Emergency Department BIB EMS complaining of constant, weakness and numbness in bilateral legs, mild middle back pain and right hip pain onset 2 days ago. Pt fell while in the bathroom 4 days ago. Reports falling backwards. Pt reports having lower back surgery by Dr. Lovell Sheehan 1 year ago. He reports hx of blood clot in right leg. Pt denies incontinence. Pt reports having tingling in bilateral hands.   Past Medical History  Diagnosis Date  . Hypertension   . GERD (gastroesophageal reflux disease)   . High cholesterol   . Enlarged prostate   . High cholesterol     Past Surgical History  Procedure Date  . Joint replacement 2001  and 2002    both knees  . Laminectomy 08/2010  . Back surgery   . Replacement total knee   . Cholecystectomy 11/01/10  . Anterior cervical decomp/discectomy fusion 01/16/2011    Procedure: ANTERIOR CERVICAL DECOMPRESSION/DISCECTOMY FUSION 1 LEVEL;  Surgeon: Cristi Loron;  Location: [facility];  Service: Neurosurgery;  Laterality: N/A;  Cervical six-seven  Anterior Cervical Decomp[ression Fusion  . Anterior cervical decomp/discectomy fusion 01/23/2011    Procedure: ANTERIOR CERVICAL DECOMPRESSION/DISCECTOMY FUSION 1 LEVEL/HARDWARE REMOVAL;  Surgeon: Cristi Loron;  Location: [facility];  Service: Neurosurgery;  Laterality: N/A;  Evacuation of Cervical Six-Seven Hematoma    History reviewed. No pertinent family  history.  History  Substance Use Topics  . Smoking status: Former Games developer  . Smokeless tobacco: Not on file  . Alcohol Use: No      Review of Systems  Constitutional: Negative for fever.  HENT: Negative for neck pain.   Respiratory: Negative.   Cardiovascular: Negative.   Gastrointestinal: Negative for nausea and vomiting.  Musculoskeletal: Negative for back pain and joint swelling.  Skin: Negative for wound.  Neurological: Positive for weakness. Negative for syncope and headaches.    Allergies  Vicodin  Home Medications   Current Outpatient Rx  Name Route Sig Dispense Refill  . ASPIRIN 81 MG PO TBEC Oral Take 81 mg by mouth daily.     Marland Kitchen ZYRTEC PO Oral Take 5 mg by mouth daily.     Marland Kitchen CITALOPRAM HYDROBROMIDE 20 MG PO TABS Oral Take 40 mg by mouth daily.    . ACIDOPHILUS PO Oral Take 1 capsule by mouth daily.    Marland Kitchen OVER THE COUNTER MEDICATION Oral Take 1 tablet by mouth at bedtime. Stool softener    . PANTOPRAZOLE SODIUM 40 MG PO TBEC Oral Take 40 mg by mouth 2 (two) times daily.    . SENNA 8.6 MG PO TABS Oral Take 1 tablet by mouth 2 (two) times daily.    Marland Kitchen SIMETHICONE 80 MG PO CHEW Oral Chew 80 mg by mouth 3 (three) times daily.    Marland Kitchen TAMSULOSIN HCL 0.4 MG PO CAPS Oral Take 0.4 mg by mouth daily.     Marland Kitchen PREDNISONE (PAK) 10 MG PO TABS  Take 5 tabs day 1, 4 tabs day 2, 3 tabs day 3, 2 tabs day 4, 1 tab day 5 15 tablet 0    BP 157/88  Pulse 68  Temp 98.4 F (36.9 C) (Oral)  Resp 16  SpO2 97%  Physical Exam  Nursing note and vitals reviewed. Constitutional: He is oriented to person, place, and time. He appears well-developed and well-nourished.  HENT:  Head: Normocephalic and atraumatic.  Neck: Normal range of motion. Neck supple.       No pain to neck or back  Musculoskeletal: He exhibits no edema.       Mild tenderness to paraspinal muscles of cervical Mild tenderness to lumbar sacral Mild decreased sensation to left foot compared to right Mild weakness to legs  bilaterally compared to arms bilaterally   Neurological: He is alert and oriented to person, place, and time.    ED Course  Procedures (including critical care time) DIAGNOSTIC STUDIES: Oxygen Saturation is 96% on room air, normal by my interpretation.    COORDINATION OF CARE: 2:45PM EDP discusses pt ED treatment with pt      Labs Reviewed  POCT I-STAT, CHEM 8   Ct Cervical Spine Wo Contrast  09/26/2011  *RADIOLOGY REPORT*  Clinical Data: Fall, with neck pain.  Lower extremity weakness.  CT CERVICAL SPINE WITHOUT CONTRAST  Technique:  Multidetector CT imaging of the cervical spine was performed. Multiplanar CT image reconstructions were also generated.  Comparison: MRI cervical spine 04/05/2011.  Cervical spine plain films (intraoperative) dated 01/16/2011.  Findings: There is no visible fracture or traumatic subluxation.  I see no prevertebral soft tissue swelling or intraspinal hematoma. The odontoid is intact with moderate degenerative change anteriorly at C1-C2.  The patient has previously undergone C6-7 ACDF with anterior plating.  Fusion appears solid.  Moderate stenosis remains at this level due to advanced facet arthropathy and ligamentum flavum calcification.  Large flowing osteophytes anteriorly (? D I S H) yield a functional fusion between the C4, C5 and C6 vertebrae, as well as incomplete bony bridging across C2-3 and C3-4.  The   posterior longitudinal ligament from C4-C6 appears solidly fused.  There is a small central protrusion at C3-C4 associated with right greater than left uncinate spurring.  Mild annular bulging with left-sided neural foraminal narrowing is seen at C2-C3.  Multilevel facet arthropathy is observed.  There is 1 mm facet mediated anterolisthesis C7-T1. There is a small area of ossification of the nuchal ligament posterior to C5. Lung apices are clear.  No neck masses.  Mild carotid calcification.  Compared to prior plain films, as well as prior MRI scans, the  appearance is similar.  IMPRESSION: No visible cervical spine fracture or traumatic subluxation.  Prior C6-7 cervical fusion appears solid.  Probable DISH.  Multilevel degenerative changes as described.  Original Report Authenticated By: Elsie Stain, M.D.   Mr Cervical Spine Wo Contrast  09/26/2011  *RADIOLOGY REPORT*  Clinical Data:  Bilateral hand tingling.  Weakness and numbness in both legs.  Mild middle back pain and right hip pain 2 days. Larey Seat backwards 4 days ago.  MRI CERVICAL AND LUMBAR SPINE WITHOUT CONTRAST  Technique:  Multiplanar and multiecho pulse sequences of the cervical spine, to include the craniocervical junction and cervicothoracic junction, and lumbar spine, were obtained without intravenous contrast.  Comparison:  CT of the cervical spine 09/26/2011.  MR of the cervical spine 04/05/2011.  MRI of the lumbar spine 01/08/2011.  MRI CERVICAL SPINE  Findings:  Nodules within the thyroid gland measure up to 2.8 cm. Ultrasound recommended for further delineation.  Cervical medullary junction unremarkable.  Flowing anterior osteophytes C2 through C6. Nonsurgical fusion of C4-5 and C5-6.  Surgical fusion C6-7 with anterior plate causing metallic artifact. Redundancy of the posterior ligaments at the C6-7 level appear calcified on CT contributing to the spinal stenosis.  Within the adjacent cord there is altered signal intensity which has progressed slightly since prior examination and may represent progressive myelomalacia from chronic compression of the cord although superimposed edema from recent fall not excluded.  No definitive findings of acute surrounding  soft tissue injury.  Vertebral arteries are patent.  C2-3:  Mild bulge.  Facet joint degenerative changes greater on the left.  Mild left foraminal narrowing.  C3-4:  Broad-based disc osteophyte greater to the right.  Mild cord flattening greater on the right.  Uncinate bony overgrowth with mild to moderate right-sided and mild left-sided  foraminal narrowing.  C4-5:  Mild spur.  Mild spinal stenosis.  C5-6:  Spur slightly greater to the left.  Mild spinal stenosis. Mild right-sided and minimal left-sided foraminal narrowing.  C6-7:  Fusion as above.  Prominent facet joint degenerative changes.  C7-T1:  Prominent facet joint degenerative changes.  Bony overgrowth greater on the right with moderate right-sided and mild left-sided foraminal narrowing.  Bulge with mild spinal stenosis.  T1-2:  Bulge with mild spinal stenosis.  T2-3:  No significant spinal stenosis or foraminal narrowing.  IMPRESSION: Flowing anterior osteophytes C2 through C6. Nonsurgical fusion of C4-5 and C5-6.  Surgical fusion C6-7 with anterior plate causing metallic artifact. Redundancy of the posterior ligaments at the C6-7 level appear calcified on CT contributing to the spinal stenosis.  Within the adjacent cord there is altered signal intensity which has progressed slightly since prior examination and may represent progressive myelomalacia from chronic compression of the cord although superimposed edema from recent fall not excluded.  C3-4 broad-based disc osteophyte greater to the right.  Mild cord flattening greater on the right.  Uncinate bony overgrowth with mild to moderate right-sided and mild left-sided foraminal narrowing.  C7-T1 prominent facet joint degenerative changes.  Bony overgrowth greater on the right with moderate right-sided and mild left-sided foraminal narrowing.  Bulge with mild spinal stenosis.   Nodules within the thyroid gland measure up to 2.8 cm. Ultrasound recommended for further delineation.  MRI LUMBAR SPINE  Findings: Last fully open disc space is labeled L5-S1.  Present examination incorporates from T11 through the S2 level.  Conus L1-2 level.  Visualized paravertebral structures unremarkable.  T11-12:  Mild facet joint degenerative changes.  T12-L1:  Minimal to mild bulge lateral position greater on the right without nerve root compression.  Mild  facet joint degenerative changes.  L1-2:  Facet joint degenerative changes and ligamentum flavum hypertrophy greater on the left.  Very mild bulge.  L2-3:  Facet joint degenerative changes.  Mild bulge laterally without nerve root compression.  L3-4:  Moderate facet joint degenerative changes and ligamentum flavum hypertrophy.  Short pedicles.  Mild bulge.  Mild spinal stenosis.  Minimal bilateral foraminal narrowing.  L4-5:  Prior surgery.  No complication noted.  Facet joint degenerative changes.  Short pedicles.  Mild bilateral foraminal narrowing  L5-S1:  Prominent disc degeneration with broad-based disc osteophyte with lateral extension greater on the right with mass effect upon the exiting L5 nerve roots greater on the right. Bilateral facet joint degenerative changes.  IMPRESSION: No significant change since prior examination without interval  development of a right-sided disc herniation.  Most notable spinal stenosis L3-4 level.  L5-S1 lateral extension of disc/osteophyte greater on the right with encroachment upon the exiting L5 nerve roots.  Original Report Authenticated By: Fuller Canada, M.D.   Mr Lumbar Spine Wo Contrast  09/26/2011  *RADIOLOGY REPORT*  Clinical Data:  Bilateral hand tingling.  Weakness and numbness in both legs.  Mild middle back pain and right hip pain 2 days. Larey Seat backwards 4 days ago.  MRI CERVICAL AND LUMBAR SPINE WITHOUT CONTRAST  Technique:  Multiplanar and multiecho pulse sequences of the cervical spine, to include the craniocervical junction and cervicothoracic junction, and lumbar spine, were obtained without intravenous contrast.  Comparison:  CT of the cervical spine 09/26/2011.  MR of the cervical spine 04/05/2011.  MRI of the lumbar spine 01/08/2011.  MRI CERVICAL SPINE  Findings:  Nodules within the thyroid gland measure up to 2.8 cm. Ultrasound recommended for further delineation.  Cervical medullary junction unremarkable.  Flowing anterior osteophytes C2 through C6.  Nonsurgical fusion of C4-5 and C5-6.  Surgical fusion C6-7 with anterior plate causing metallic artifact. Redundancy of the posterior ligaments at the C6-7 level appear calcified on CT contributing to the spinal stenosis.  Within the adjacent cord there is altered signal intensity which has progressed slightly since prior examination and may represent progressive myelomalacia from chronic compression of the cord although superimposed edema from recent fall not excluded.  No definitive findings of acute surrounding  soft tissue injury.  Vertebral arteries are patent.  C2-3:  Mild bulge.  Facet joint degenerative changes greater on the left.  Mild left foraminal narrowing.  C3-4:  Broad-based disc osteophyte greater to the right.  Mild cord flattening greater on the right.  Uncinate bony overgrowth with mild to moderate right-sided and mild left-sided foraminal narrowing.  C4-5:  Mild spur.  Mild spinal stenosis.  C5-6:  Spur slightly greater to the left.  Mild spinal stenosis. Mild right-sided and minimal left-sided foraminal narrowing.  C6-7:  Fusion as above.  Prominent facet joint degenerative changes.  C7-T1:  Prominent facet joint degenerative changes.  Bony overgrowth greater on the right with moderate right-sided and mild left-sided foraminal narrowing.  Bulge with mild spinal stenosis.  T1-2:  Bulge with mild spinal stenosis.  T2-3:  No significant spinal stenosis or foraminal narrowing.  IMPRESSION: Flowing anterior osteophytes C2 through C6. Nonsurgical fusion of C4-5 and C5-6.  Surgical fusion C6-7 with anterior plate causing metallic artifact. Redundancy of the posterior ligaments at the C6-7 level appear calcified on CT contributing to the spinal stenosis.  Within the adjacent cord there is altered signal intensity which has progressed slightly since prior examination and may represent progressive myelomalacia from chronic compression of the cord although superimposed edema from recent fall not excluded.   C3-4 broad-based disc osteophyte greater to the right.  Mild cord flattening greater on the right.  Uncinate bony overgrowth with mild to moderate right-sided and mild left-sided foraminal narrowing.  C7-T1 prominent facet joint degenerative changes.  Bony overgrowth greater on the right with moderate right-sided and mild left-sided foraminal narrowing.  Bulge with mild spinal stenosis.   Nodules within the thyroid gland measure up to 2.8 cm. Ultrasound recommended for further delineation.  MRI LUMBAR SPINE  Findings: Last fully open disc space is labeled L5-S1.  Present examination incorporates from T11 through the S2 level.  Conus L1-2 level.  Visualized paravertebral structures unremarkable.  T11-12:  Mild facet joint degenerative changes.  T12-L1:  Minimal to mild bulge lateral position greater on the right without nerve root compression.  Mild facet joint degenerative changes.  L1-2:  Facet joint degenerative changes and ligamentum flavum hypertrophy greater on the left.  Very mild bulge.  L2-3:  Facet joint degenerative changes.  Mild bulge laterally without nerve root compression.  L3-4:  Moderate facet joint degenerative changes and ligamentum flavum hypertrophy.  Short pedicles.  Mild bulge.  Mild spinal stenosis.  Minimal bilateral foraminal narrowing.  L4-5:  Prior surgery.  No complication noted.  Facet joint degenerative changes.  Short pedicles.  Mild bilateral foraminal narrowing  L5-S1:  Prominent disc degeneration with broad-based disc osteophyte with lateral extension greater on the right with mass effect upon the exiting L5 nerve roots greater on the right. Bilateral facet joint degenerative changes.  IMPRESSION: No significant change since prior examination without interval development of a right-sided disc herniation.  Most notable spinal stenosis L3-4 level.  L5-S1 lateral extension of disc/osteophyte greater on the right with encroachment upon the exiting L5 nerve roots.  Original Report  Authenticated By: Fuller Canada, M.D.     1. Back pain       MDM  I discussed MRI report with Dr. Gerlene Fee who recommends placing pt on steroid pack and f/u with Dr. Lovell Sheehan      I personally performed the services described in this documentation, which was scribed in my presence.  The recorded information has been reviewed and considered.    Rolan Bucco, MD 09/26/11 2021

## 2011-09-26 NOTE — ED Notes (Signed)
Pt. Came back from MRI - uneventful

## 2011-09-26 NOTE — ED Provider Notes (Signed)
MRI results reviewed and discussed with Dr. Fredderick Phenix.  Dr. Fredderick Phenix consulted with Dr. Gerlene Fee (on-call for Dr. Lovell Sheehan), who recommends steroid dose pack and follow-up with Dr. Lovell Sheehan next week.  Treatment plan and results discussed with patient and family.  Jimmye Norman, NP 09/26/11 2145

## 2011-09-26 NOTE — ED Notes (Signed)
Pt asking if I was gonna start a IV to give her the pain med. I explained to her that med was ordered IM.  Was requesting to know what it was and was I going to put it in her vein.

## 2011-09-26 NOTE — ED Notes (Signed)
Per EMS: pt c/o pain and heaviness in legs since Sunday after falling in bathroom on Friday; pt sts last back sx was in December

## 2011-09-27 NOTE — ED Provider Notes (Signed)
Medical screening examination/treatment/procedure(s) were conducted as a shared visit with non-physician practitioner(s) and myself.  I personally evaluated the patient during the encounter   Rolan Bucco, MD 09/27/11 [phone]

## 2011-10-03 ENCOUNTER — Encounter (HOSPITAL_COMMUNITY): Payer: Self-pay | Admitting: Emergency Medicine

## 2011-10-03 ENCOUNTER — Emergency Department (HOSPITAL_COMMUNITY): Payer: Medicare Other

## 2011-10-03 ENCOUNTER — Emergency Department (HOSPITAL_COMMUNITY)
Admission: EM | Admit: 2011-10-03 | Discharge: 2011-10-03 | Disposition: A | Discharge status: Home / Self Care | Payer: Medicare Other | Attending: Emergency Medicine | Admitting: Emergency Medicine

## 2011-10-03 DIAGNOSIS — Z79899 Other long term (current) drug therapy: Secondary | ICD-10-CM | POA: Insufficient documentation

## 2011-10-03 DIAGNOSIS — M545 Low back pain, unspecified: Secondary | ICD-10-CM | POA: Insufficient documentation

## 2011-10-03 DIAGNOSIS — N400 Enlarged prostate without lower urinary tract symptoms: Secondary | ICD-10-CM | POA: Insufficient documentation

## 2011-10-03 DIAGNOSIS — I10 Essential (primary) hypertension: Secondary | ICD-10-CM | POA: Insufficient documentation

## 2011-10-03 DIAGNOSIS — K219 Gastro-esophageal reflux disease without esophagitis: Secondary | ICD-10-CM | POA: Insufficient documentation

## 2011-10-03 DIAGNOSIS — M549 Dorsalgia, unspecified: Secondary | ICD-10-CM

## 2011-10-03 DIAGNOSIS — R209 Unspecified disturbances of skin sensation: Secondary | ICD-10-CM | POA: Insufficient documentation

## 2011-10-03 DIAGNOSIS — E7800 Pure hypercholesterolemia, unspecified: Secondary | ICD-10-CM | POA: Insufficient documentation

## 2011-10-03 DIAGNOSIS — Z96659 Presence of unspecified artificial knee joint: Secondary | ICD-10-CM | POA: Insufficient documentation

## 2011-10-03 DIAGNOSIS — R29898 Other symptoms and signs involving the musculoskeletal system: Secondary | ICD-10-CM | POA: Insufficient documentation

## 2011-10-03 LAB — CBC WITH DIFFERENTIAL/PLATELET
Basophils Absolute: 0 10*3/uL (ref 0.0–0.1)
Basophils Relative: 0 % (ref 0–1)
Lymphocytes Relative: 30 % (ref 12–46)
MCHC: 34.3 g/dL (ref 30.0–36.0)
Neutro Abs: 5.6 10*3/uL (ref 1.7–7.7)
Platelets: 178 10*3/uL (ref 150–400)
RDW: 14.1 % (ref 11.5–15.5)
WBC: 9.3 10*3/uL (ref 4.0–10.5)

## 2011-10-03 LAB — BASIC METABOLIC PANEL
CO2: 26 mEq/L (ref 19–32)
Calcium: 9.8 mg/dL (ref 8.4–10.5)
Chloride: 102 mEq/L (ref 96–112)
Creatinine, Ser: 0.94 mg/dL (ref 0.50–1.35)
GFR calc Af Amer: >90 mL/min (ref >90)
Sodium: 140 mEq/L (ref 135–145)

## 2011-10-03 MED ORDER — PREDNISONE 20 MG PO TABS
60.0000 mg | ORAL_TABLET | Freq: Once | ORAL | Status: AC
  Administered 2011-10-03: 60 mg via ORAL
  Filled 2011-10-03: qty 3

## 2011-10-03 MED ORDER — SODIUM CHLORIDE 0.9 % IV SOLN: INTRAVENOUS | Status: DC

## 2011-10-03 MED ORDER — SODIUM CHLORIDE 0.9 % IV BOLUS (SEPSIS)
250.0000 mL | Freq: Once | INTRAVENOUS | Status: AC
  Administered 2011-10-03: 250 mL via INTRAVENOUS

## 2011-10-03 MED ORDER — LORAZEPAM 2 MG/ML IJ SOLN
1.0000 mg | Freq: Once | INTRAMUSCULAR | Status: AC
  Administered 2011-10-03: 1 mg via INTRAVENOUS
  Filled 2011-10-03: qty 1

## 2011-10-03 MED ORDER — ONDANSETRON HCL 4 MG/2ML IJ SOLN
4.0000 mg | Freq: Once | INTRAMUSCULAR | Status: AC
  Administered 2011-10-03: 4 mg via INTRAVENOUS
  Filled 2011-10-03: qty 2

## 2011-10-03 MED ORDER — PREDNISONE 10 MG PO TABS: ORAL_TABLET | ORAL | Status: DC

## 2011-10-03 MED ORDER — HYDROMORPHONE HCL PF 1 MG/ML IJ SOLN
1.0000 mg | Freq: Once | INTRAMUSCULAR | Status: AC
Start: 2011-10-03 — End: 2011-10-03
  Administered 2011-10-03: 1 mg via INTRAVENOUS
  Filled 2011-10-03: qty 1

## 2011-10-03 NOTE — ED Provider Notes (Signed)
Medical screening examination/treatment/procedure(s) were conducted as a shared visit with non-physician practitioner(s) and myself.  I personally evaluated the patient during the encounter   Shelda Jakes, MD 10/03/11 1640

## 2011-10-03 NOTE — ED Provider Notes (Signed)
MRI results reviewed and discussed with Dr. Deretha Emory.  Spoke with Dr. Lovell Sheehan, who has reviewed films--he recommends another round of steroids, and will see the patient in the office on Thursday at 430 pm.  Plan discussed with patient and family, are agreeable.  Jimmye Norman, NP 10/03/11 [phone]

## 2011-10-03 NOTE — ED Provider Notes (Signed)
History     CSN: [id number]  Arrival date & time 10/03/11  1126   First MD Initiated Contact with Patient 10/03/11 1150      Chief Complaint  Patient presents with  . Numbness    (Consider location/radiation/quality/duration/timing/severity/associated sxs/prior treatment) HPI  Past Medical History  Diagnosis Date  . Hypertension   . GERD (gastroesophageal reflux disease)   . High cholesterol   . Enlarged prostate   . High cholesterol     Past Surgical History  Procedure Date  . Joint replacement 2001  and 2002    both knees  . Laminectomy 08/2010  . Back surgery   . Replacement total knee   . Cholecystectomy 11/01/10  . Anterior cervical decomp/discectomy fusion 01/16/2011    Procedure: ANTERIOR CERVICAL DECOMPRESSION/DISCECTOMY FUSION 1 LEVEL;  Surgeon: Cristi Loron;  Location: [facility];  Service: Neurosurgery;  Laterality: N/A;  Cervical six-seven  Anterior Cervical Decomp[ression Fusion  . Anterior cervical decomp/discectomy fusion 01/23/2011    Procedure: ANTERIOR CERVICAL DECOMPRESSION/DISCECTOMY FUSION 1 LEVEL/HARDWARE REMOVAL;  Surgeon: Cristi Loron;  Location: [facility];  Service: Neurosurgery;  Laterality: N/A;  Evacuation of Cervical Six-Seven Hematoma    History reviewed. No pertinent family history.  History  Substance Use Topics  . Smoking status: Former Games developer  . Smokeless tobacco: Not on file  . Alcohol Use: No      Review of Systems  Allergies  Vicodin  Home Medications   Current Outpatient Rx  Name Route Sig Dispense Refill  . ASPIRIN 81 MG PO TBEC Oral Take 81 mg by mouth daily.     Marland Kitchen ZYRTEC PO Oral Take 2.5 mg by mouth 2 (two) times daily.     Marland Kitchen CITALOPRAM HYDROBROMIDE 40 MG PO TABS Oral Take 40 mg by mouth daily.    . ACIDOPHILUS PO Oral Take 1 capsule by mouth daily.    . ADULT MULTIVITAMIN W/MINERALS CH Oral Take 1 tablet by mouth daily.    Marland Kitchen OVER THE COUNTER MEDICATION Oral Take 1 tablet by mouth at bedtime. Stool  softener    . PANTOPRAZOLE SODIUM 40 MG PO TBEC Oral Take 40 mg by mouth 2 (two) times daily.    . SENNA 8.6 MG PO TABS Oral Take 1 tablet by mouth 2 (two) times daily.    Marland Kitchen SIMETHICONE 80 MG PO CHEW Oral Chew 80 mg by mouth 3 (three) times daily.    Marland Kitchen TAMSULOSIN HCL 0.4 MG PO CAPS Oral Take 0.4 mg by mouth daily.       BP 120/91  Pulse 81  Temp 97.9 F (36.6 C) (Oral)  Resp 17  SpO2 99%  Physical Exam  ED Course  Procedures (including critical care time)  Labs Reviewed  BASIC METABOLIC PANEL - Abnormal; Notable for the following:    GFR calc non Af Amer 79 (*)     All other components within normal limits  CBC WITH DIFFERENTIAL   No results found.   1. Back pain    2:16 PM Handoff from Dr. Deretha Emory. Worsening LE symptoms of numbness. History of c-spine and l-spine problems. Repeating MRI. Will speak with neurosurg after results return.   Vital signs reviewed and are as follows: Filed Vitals:   10/03/11 1130  BP: 120/91  Pulse: 81  Temp: 97.9 F (36.6 C)  Resp: 17   Exam: Gen NAD; Heart RRR, nml S1,S2, no m/r/g; Lungs CTAB; Abd soft, NT, no rebound or guarding; Ext 2+ pedal pulses bilaterally, 2+  popliteal pulses bilaterally, no edema. No gross strength or sensory deficits noted bilaterally.   3:30 PM Handoff to [facility] NP.   MDM  Pending MRI for eval of LE weakness.         Renne Crigler, [place] 10/03/11 [phone]

## 2011-10-03 NOTE — ED Provider Notes (Signed)
History     CSN: [id number]  Arrival date & time 10/03/11  1126   First MD Initiated Contact with Patient 10/03/11 1150      Chief Complaint  Patient presents with  . Numbness    (Consider location/radiation/quality/duration/timing/severity/associated sxs/prior treatment) The history is provided by the patient and the EMS personnel.   patient is a 78 year old male presents with worsening bilateral lower trimming the discomfort and numbness and weakness. Patient was seen August 13 for the same complaint had MRI of his neck and back without significant findings was started on a prednisone course and was to followup with his neurosurgeon Dr. Lovell Sheehan. Dr. Lovell Sheehan is out of town so patient not able to followup. Patient completed the prednisone and feels it is been getting worse since then. No new symptoms just worse. Patient is also status post fusion of his neck cervical vertebrae that was done in December. No upper extremity symptoms. No history of incontinence.  Past Medical History  Diagnosis Date  . Hypertension   . GERD (gastroesophageal reflux disease)   . High cholesterol   . Enlarged prostate   . High cholesterol     Past Surgical History  Procedure Date  . Joint replacement 2001  and 2002    both knees  . Laminectomy 08/2010  . Back surgery   . Replacement total knee   . Cholecystectomy 11/01/10  . Anterior cervical decomp/discectomy fusion 01/16/2011    Procedure: ANTERIOR CERVICAL DECOMPRESSION/DISCECTOMY FUSION 1 LEVEL;  Surgeon: Cristi Loron;  Location: [facility];  Service: Neurosurgery;  Laterality: N/A;  Cervical six-seven  Anterior Cervical Decomp[ression Fusion  . Anterior cervical decomp/discectomy fusion 01/23/2011    Procedure: ANTERIOR CERVICAL DECOMPRESSION/DISCECTOMY FUSION 1 LEVEL/HARDWARE REMOVAL;  Surgeon: Cristi Loron;  Location: [facility];  Service: Neurosurgery;  Laterality: N/A;  Evacuation of Cervical Six-Seven Hematoma    History  reviewed. No pertinent family history.  History  Substance Use Topics  . Smoking status: Former Games developer  . Smokeless tobacco: Not on file  . Alcohol Use: No      Review of Systems  Constitutional: Negative for fever.  HENT: Negative for neck pain.   Eyes: Negative for visual disturbance.  Respiratory: Negative for shortness of breath.   Cardiovascular: Negative for chest pain and leg swelling.  Gastrointestinal: Negative for nausea, vomiting and abdominal pain.  Genitourinary: Negative for dysuria and difficulty urinating.  Musculoskeletal: Positive for back pain.  Skin: Negative for rash.  Neurological: Positive for weakness and numbness.    Allergies  Vicodin  Home Medications   Current Outpatient Rx  Name Route Sig Dispense Refill  . ASPIRIN 81 MG PO TBEC Oral Take 81 mg by mouth daily.     Marland Kitchen ZYRTEC PO Oral Take 2.5 mg by mouth 2 (two) times daily.     Marland Kitchen CITALOPRAM HYDROBROMIDE 40 MG PO TABS Oral Take 40 mg by mouth daily.    . ACIDOPHILUS PO Oral Take 1 capsule by mouth daily.    . ADULT MULTIVITAMIN W/MINERALS CH Oral Take 1 tablet by mouth daily.    Marland Kitchen OVER THE COUNTER MEDICATION Oral Take 1 tablet by mouth at bedtime. Stool softener    . PANTOPRAZOLE SODIUM 40 MG PO TBEC Oral Take 40 mg by mouth 2 (two) times daily.    . SENNA 8.6 MG PO TABS Oral Take 1 tablet by mouth 2 (two) times daily.    Marland Kitchen SIMETHICONE 80 MG PO CHEW Oral Chew 80 mg by mouth  3 (three) times daily.    Marland Kitchen TAMSULOSIN HCL 0.4 MG PO CAPS Oral Take 0.4 mg by mouth daily.       BP 120/91  Pulse 81  Temp 97.9 F (36.6 C) (Oral)  Resp 17  SpO2 99%  Physical Exam  Nursing note and vitals reviewed. Constitutional: He is oriented to person, place, and time. He appears well-developed and well-nourished. No distress.  HENT:  Head: Normocephalic and atraumatic.  Mouth/Throat: Oropharynx is clear and moist.  Eyes: Conjunctivae and EOM are normal. Pupils are equal, round, and reactive to light.  Neck:  Normal range of motion. Neck supple.  Cardiovascular: Normal rate, regular rhythm and normal heart sounds.   No murmur heard. Pulmonary/Chest: Effort normal and breath sounds normal.  Abdominal: Soft. Bowel sounds are normal. There is no tenderness.  Musculoskeletal: He exhibits no edema.  Neurological: He is alert and oriented to person, place, and time. No cranial nerve deficit. He exhibits normal muscle tone. Coordination normal.       Except for subjective numbness to both lower extremities she has good range of motion of toes good motor strength good cap refill.  Skin: Skin is warm. No rash noted.    ED Course  Procedures (including critical care time)  Labs Reviewed  BASIC METABOLIC PANEL - Abnormal; Notable for the following:    GFR calc non Af Amer 79 (*)     All other components within normal limits  CBC WITH DIFFERENTIAL   Mr Lumbar Spine Wo Contrast  10/03/2011  *RADIOLOGY REPORT*  Clinical Data: Worsening bilateral leg heaviness, weakness and numbness.  MRI LUMBAR SPINE WITHOUT CONTRAST  Technique:  Multiplanar and multiecho pulse sequences of the lumbar spine were obtained without intravenous contrast.  Comparison: Lumbar MRI 09/26/2011 and 01/08/2011.  Findings: The lumbar alignment is stable.  There is stable chronic disc space loss and endplate degeneration at L5-S1.  There is no evidence of endplate destruction, discal hyperintensity or pars defect. The lumbar pedicles are relatively short on a congenital basis.  The conus medullaris extends to the L1-L2 level and appears normal. There are no paraspinal abnormalities.  From T11-T12 through L2-L3, there is stable mild disc bulging and facet hypertrophy without resulting spinal stenosis or nerve root encroachment.  L3-L4:  Stable disc bulging with moderate facet and ligamentous hypertrophy.  There is mild central and right lateral recess stenosis which appears unchanged.  The foramina are sufficiently patent.  L4-L5:  Stable  postsurgical changes status post laminectomy.  Disc bulging and facet hypertrophy contribute to mild chronic biforaminal stenosis which appears unchanged.  L5-S1:  Stable chronic degenerative disc disease with annular disc bulging and osteophytes.  There is mild facet and ligamentous hypertrophy.  Biforaminal stenosis appears unchanged.  IMPRESSION:  1.  Stable lumbar MRI compared with recent study of 1 week prior. No acute findings or explanation for lower extremity weakness identified. 2.  Chronic degenerative disc disease at L5-S1 with endplate degeneration and biforaminal stenosis. 3.  Stable postsurgical changes at L4-L5 status post laminectomy. Mild biforaminal stenosis appears stable. 4.  Stable mild multifactorial spinal stenosis at L3-L4.   Original Report Authenticated By: Gerrianne Scale, M.D.      1. Back pain       MDM  The patient with difficulty with low lumbar area of back pain since the August 9 after a fall in the bathroom where he hit his back along the door frame and then slid down. He was seen [facility] on the 13th 4 pain numbness or in his legs both legs. At that time CT was done of his neck and he had MRI of neck and low back without significant findings there is little bit of narrowing in DL 3 area and also Lopid of the osteophyte at the L5 area as noted above on the studies. Patient presented today stating that the heaviness in the legs in the numb feeling was worse he was feeling better after being treated with prednisone for the past week and was to follow up with his neurosurgeon Dr. Lovell Sheehan but he was out of town on vacation. Patient felt that he needed to be admitted.  The plan was to move patient to CDU repeat the MRI to make sure there was no interval change compared to the MRI on the 13th. Neurosurgery that would be recontacted to discuss the findings and workup followup. If the findings showed a sniffing interval changes that warrant admission  that would be done. If no significant or will change in the MRI the patient to be discharged home with close followup with neurosurgery after discussing the case with them.   Medical screening examination/treatment/procedure(s) were conducted as a shared visit with non-physician practitioner(s) and myself.  I personally evaluated the patient during the encounter This will be a shared visit with the mid-level and CDU.  Shelda Jakes, MD 10/03/11 [phone]

## 2011-10-03 NOTE — ED Notes (Signed)
Per PT[facility]: Pt hx thoracic spine surgery x3. Larey Seat 09/22/11 and was seen 4 days later at [facility] because he was experiencing weakness in his legs. Did CT and MRI and told pt he had inflammation and was sent home with Rx Prednisone. Completed prednisone but didn't feel any better. Still feeling numb and tingling in legs.

## 2011-10-04 NOTE — ED Provider Notes (Signed)
Medical screening examination/treatment/procedure(s) were conducted as a shared visit with non-physician practitioner(s) and myself.  I personally evaluated the patient during the encounter  Shelda Jakes, MD 10/04/11 2023

## 2011-10-09 ENCOUNTER — Other Ambulatory Visit: Payer: Self-pay | Admitting: Neurosurgery

## 2011-10-09 DIAGNOSIS — M549 Dorsalgia, unspecified: Secondary | ICD-10-CM

## 2011-10-14 ENCOUNTER — Ambulatory Visit
Admission: RE | Admit: 2011-10-14 | Discharge: 2011-10-14 | Disposition: A | Discharge status: Home / Self Care | Payer: Medicare Other | Source: Ambulatory Visit | Attending: Neurosurgery | Admitting: Neurosurgery

## 2011-10-14 DIAGNOSIS — M549 Dorsalgia, unspecified: Secondary | ICD-10-CM

## 2011-10-26 ENCOUNTER — Other Ambulatory Visit: Payer: Self-pay | Admitting: Neurosurgery

## 2011-10-27 ENCOUNTER — Encounter (HOSPITAL_COMMUNITY): Payer: Self-pay | Admitting: Pharmacy Technician

## 2011-11-01 ENCOUNTER — Inpatient Hospital Stay (HOSPITAL_COMMUNITY): Admission: RE | Admit: 2011-11-01 | Payer: Medicare Other | Source: Ambulatory Visit

## 2011-11-02 ENCOUNTER — Encounter (HOSPITAL_COMMUNITY): Payer: Self-pay

## 2011-11-02 ENCOUNTER — Encounter (HOSPITAL_COMMUNITY)
Admission: RE | Admit: 2011-11-02 | Discharge: 2011-11-02 | Disposition: A | Discharge status: Home / Self Care | Payer: Medicare Other | Source: Ambulatory Visit | Attending: Neurosurgery | Admitting: Neurosurgery

## 2011-11-02 LAB — CBC
Hemoglobin: 14.7 g/dL (ref 13.0–17.0)
MCH: 28.3 pg (ref 26.0–34.0)
MCHC: 33.9 g/dL (ref 30.0–36.0)
MCV: 83.5 fL (ref 78.0–100.0)

## 2011-11-02 LAB — BASIC METABOLIC PANEL
BUN: 16 mg/dL (ref 6–23)
Calcium: 9.7 mg/dL (ref 8.4–10.5)
Creatinine, Ser: 1.09 mg/dL (ref 0.50–1.35)
GFR calc non Af Amer: 64 mL/min — ABNORMAL LOW (ref >90)
Glucose, Bld: 91 mg/dL (ref 70–99)
Sodium: 138 mEq/L (ref 135–145)

## 2011-11-02 NOTE — Progress Notes (Signed)
Pt here for preadmission.  Denies having any heart studies and/or sleep apnea.Pt's PCP: Dr. Alver Sorrow, [facility] office@ [place](#[phone]).//L. Lyonel Morejon,RN

## 2011-11-02 NOTE — Pre-Procedure Instructions (Addendum)
20 Ricky Bradley  11/02/2011   Your procedure is scheduled on:  Monday, 11/06/2011@11 :15AM.  Report to [facility] at 8:15 AM.  Call this number if you have problems the morning of surgery: [phone]   Remember:   Do not eat food and drink liquids:After Midnight.    Take these medicines the morning of surgery with A SIP OF WATER: Zyrtec, Celexa, Protonix, Sulfamethoxazole-trimethoprim(Bactrim-DS), Flomax.                         ** Discontinue aspirin, Coumadin, Plavix, Effient, and herbal medications                 Do not wear jewelry, make-up or nail polish.  Do not wear lotions, powders, or perfumes. You may not wear deodorant.  Do not shave 48 hours prior to surgery. Men may shave face and neck.  Do not bring valuables to the hospital.  Contacts, dentures or bridgework may not be worn into surgery.  Leave suitcase in the car. After surgery it may be brought to your room.  For patients admitted to the hospital, checkout time is 11:00 AM the day of discharge.   Patients discharged the day of surgery will not be allowed to drive home.  Name and phone number of your driver: Daughter, Ricky Bradley.  Special Instructions: Shower using CHG 2 nights before surgery and the night before surgery.  If you shower the day of surgery use CHG.  Use special wash - you have one bottle of CHG for all showers.  You should use approximately 1/3 of the bottle for each shower.   Please read over the following fact sheets that you were given: Pain Booklet, Coughing and Deep Breathing and Surgical Site Infection Prevention

## 2011-11-05 MED ORDER — CEFAZOLIN SODIUM-DEXTROSE 2-3 GM-% IV SOLR
2.0000 g | INTRAVENOUS | Status: AC
  Administered 2011-11-06: 2 g via INTRAVENOUS
  Filled 2011-11-05: qty 50

## 2011-11-06 ENCOUNTER — Encounter (HOSPITAL_COMMUNITY): Payer: Self-pay | Admitting: Anesthesiology

## 2011-11-06 ENCOUNTER — Inpatient Hospital Stay (HOSPITAL_COMMUNITY): Payer: Medicare Other | Admitting: Anesthesiology

## 2011-11-06 ENCOUNTER — Encounter (HOSPITAL_COMMUNITY)
Admission: RE | Disposition: A | Discharge status: Skilled Nursing Facility | Payer: Self-pay | Source: Ambulatory Visit | Attending: Neurosurgery

## 2011-11-06 ENCOUNTER — Encounter (HOSPITAL_COMMUNITY): Payer: Self-pay | Admitting: *Deleted

## 2011-11-06 ENCOUNTER — Inpatient Hospital Stay (HOSPITAL_COMMUNITY)
Admission: RE | Admit: 2011-11-06 | Discharge: 2011-11-10 | DRG: 473 | Disposition: A | Discharge status: Skilled Nursing Facility | Payer: Medicare Other | Source: Ambulatory Visit | Attending: Neurosurgery | Admitting: Neurosurgery

## 2011-11-06 ENCOUNTER — Inpatient Hospital Stay (HOSPITAL_COMMUNITY): Payer: Medicare Other

## 2011-11-06 DIAGNOSIS — Z87891 Personal history of nicotine dependence: Secondary | ICD-10-CM

## 2011-11-06 DIAGNOSIS — Z7982 Long term (current) use of aspirin: Secondary | ICD-10-CM

## 2011-11-06 DIAGNOSIS — M4712 Other spondylosis with myelopathy, cervical region: Principal | ICD-10-CM | POA: Diagnosis present

## 2011-11-06 DIAGNOSIS — G959 Disease of spinal cord, unspecified: Secondary | ICD-10-CM

## 2011-11-06 DIAGNOSIS — R339 Retention of urine, unspecified: Secondary | ICD-10-CM | POA: Diagnosis present

## 2011-11-06 DIAGNOSIS — Z01812 Encounter for preprocedural laboratory examination: Secondary | ICD-10-CM

## 2011-11-06 DIAGNOSIS — Z96659 Presence of unspecified artificial knee joint: Secondary | ICD-10-CM

## 2011-11-06 HISTORY — PX: POSTERIOR CERVICAL FUSION/FORAMINOTOMY: SHX5038

## 2011-11-06 SURGERY — POSTERIOR CERVICAL FUSION/FORAMINOTOMY LEVEL 1
Anesthesia: General

## 2011-11-06 MED ORDER — BACITRACIN ZINC 500 UNIT/GM EX OINT
TOPICAL_OINTMENT | CUTANEOUS | Status: DC | PRN
  Administered 2011-11-06: 1 via TOPICAL

## 2011-11-06 MED ORDER — PHENOL 1.4 % MT LIQD
1.0000 | OROMUCOSAL | Status: DC | PRN
  Administered 2011-11-07: 1 via OROMUCOSAL
  Filled 2011-11-06: qty 177

## 2011-11-06 MED ORDER — LORATADINE 10 MG PO TABS
10.0000 mg | ORAL_TABLET | Freq: Every day | ORAL | Status: DC
  Administered 2011-11-07 – 2011-11-10 (×4): 10 mg via ORAL
  Filled 2011-11-06 (×4): qty 1

## 2011-11-06 MED ORDER — SODIUM CHLORIDE 0.9 % IR SOLN
Status: DC | PRN
  Administered 2011-11-06: 10:00:00

## 2011-11-06 MED ORDER — CEFAZOLIN SODIUM-DEXTROSE 2-3 GM-% IV SOLR
2.0000 g | Freq: Three times a day (TID) | INTRAVENOUS | Status: AC
  Administered 2011-11-06 – 2011-11-07 (×2): 2 g via INTRAVENOUS
  Filled 2011-11-06 (×2): qty 50

## 2011-11-06 MED ORDER — MENTHOL 3 MG MT LOZG
1.0000 | LOZENGE | OROMUCOSAL | Status: DC | PRN
  Administered 2011-11-06: 3 mg via ORAL
  Filled 2011-11-06: qty 9

## 2011-11-06 MED ORDER — BUPIVACAINE LIPOSOME 1.3 % IJ SUSP
INTRAMUSCULAR | Status: DC | PRN
  Administered 2011-11-06: 20 mL

## 2011-11-06 MED ORDER — HYDROMORPHONE HCL PF 1 MG/ML IJ SOLN
INTRAMUSCULAR | Status: AC
  Administered 2011-11-06 (×2): 0.5 mg
  Filled 2011-11-06: qty 1

## 2011-11-06 MED ORDER — MORPHINE SULFATE 2 MG/ML IJ SOLN
1.0000 mg | INTRAMUSCULAR | Status: DC | PRN
  Administered 2011-11-06 – 2011-11-08 (×3): 2 mg via INTRAVENOUS
  Administered 2011-11-08: 1 mg via INTRAVENOUS
  Filled 2011-11-06 (×4): qty 1

## 2011-11-06 MED ORDER — ROCURONIUM BROMIDE 100 MG/10ML IV SOLN
INTRAVENOUS | Status: DC | PRN
  Administered 2011-11-06: 10 mg via INTRAVENOUS
  Administered 2011-11-06: 50 mg via INTRAVENOUS

## 2011-11-06 MED ORDER — BUPIVACAINE LIPOSOME 1.3 % IJ SUSP
20.0000 mL | Freq: Once | INTRAMUSCULAR | Status: DC
  Filled 2011-11-06: qty 20

## 2011-11-06 MED ORDER — ONDANSETRON HCL 4 MG/2ML IJ SOLN
4.0000 mg | INTRAMUSCULAR | Status: DC | PRN
  Administered 2011-11-07: 4 mg via INTRAVENOUS
  Filled 2011-11-06: qty 2

## 2011-11-06 MED ORDER — HEMOSTATIC AGENTS (NO CHARGE) OPTIME
TOPICAL | Status: DC | PRN
  Administered 2011-11-06: 1 via TOPICAL

## 2011-11-06 MED ORDER — TAMSULOSIN HCL 0.4 MG PO CAPS
0.4000 mg | ORAL_CAPSULE | Freq: Every day | ORAL | Status: DC
  Administered 2011-11-07 – 2011-11-10 (×4): 0.4 mg via ORAL
  Filled 2011-11-06 (×5): qty 1

## 2011-11-06 MED ORDER — ACETAMINOPHEN 650 MG RE SUPP: 650.0000 mg | RECTAL | Status: DC | PRN

## 2011-11-06 MED ORDER — CITALOPRAM HYDROBROMIDE 40 MG PO TABS
40.0000 mg | ORAL_TABLET | Freq: Every day | ORAL | Status: DC
  Administered 2011-11-07 – 2011-11-10 (×4): 40 mg via ORAL
  Filled 2011-11-06 (×4): qty 1

## 2011-11-06 MED ORDER — BACITRACIN 50000 UNITS IM SOLR
INTRAMUSCULAR | Status: AC
  Filled 2011-11-06: qty 1

## 2011-11-06 MED ORDER — LACTATED RINGERS IV SOLN
INTRAVENOUS | Status: DC | PRN
  Administered 2011-11-06 (×3): via INTRAVENOUS

## 2011-11-06 MED ORDER — MIDAZOLAM HCL 5 MG/5ML IJ SOLN
INTRAMUSCULAR | Status: DC | PRN
  Administered 2011-11-06: 2 mg via INTRAVENOUS

## 2011-11-06 MED ORDER — ACETAMINOPHEN 325 MG PO TABS
650.0000 mg | ORAL_TABLET | ORAL | Status: DC | PRN
  Administered 2011-11-07 – 2011-11-10 (×6): 650 mg via ORAL
  Filled 2011-11-06 (×4): qty 2
  Filled 2011-11-06: qty 1
  Filled 2011-11-06: qty 2

## 2011-11-06 MED ORDER — FENTANYL CITRATE 0.05 MG/ML IJ SOLN
INTRAMUSCULAR | Status: DC | PRN
  Administered 2011-11-06: 150 ug via INTRAVENOUS

## 2011-11-06 MED ORDER — DIAZEPAM 2 MG PO TABS
2.0000 mg | ORAL_TABLET | Freq: Four times a day (QID) | ORAL | Status: DC | PRN
  Administered 2011-11-06 – 2011-11-07 (×2): 2 mg via ORAL
  Filled 2011-11-06 (×3): qty 1

## 2011-11-06 MED ORDER — BIOTENE DRY MOUTH MT LIQD
15.0000 mL | Freq: Two times a day (BID) | OROMUCOSAL | Status: DC
  Administered 2011-11-07 – 2011-11-09 (×5): 15 mL via OROMUCOSAL

## 2011-11-06 MED ORDER — PROPOFOL 10 MG/ML IV BOLUS
INTRAVENOUS | Status: DC | PRN
  Administered 2011-11-06: 150 mg via INTRAVENOUS

## 2011-11-06 MED ORDER — BUPIVACAINE-EPINEPHRINE PF 0.5-1:200000 % IJ SOLN
INTRAMUSCULAR | Status: DC | PRN
  Administered 2011-11-06: 10 mL

## 2011-11-06 MED ORDER — SIMETHICONE 80 MG PO CHEW
80.0000 mg | CHEWABLE_TABLET | Freq: Three times a day (TID) | ORAL | Status: DC
  Administered 2011-11-06 – 2011-11-10 (×12): 80 mg via ORAL
  Filled 2011-11-06 (×14): qty 1

## 2011-11-06 MED ORDER — SENNA 8.6 MG PO TABS
1.0000 | ORAL_TABLET | Freq: Two times a day (BID) | ORAL | Status: DC | PRN
  Administered 2011-11-07 – 2011-11-09 (×3): 8.6 mg via ORAL
  Filled 2011-11-06 (×3): qty 1

## 2011-11-06 MED ORDER — 0.9 % SODIUM CHLORIDE (POUR BTL) OPTIME
TOPICAL | Status: DC | PRN
  Administered 2011-11-06: 1000 mL

## 2011-11-06 MED ORDER — HYDROMORPHONE HCL 2 MG PO TABS
4.0000 mg | ORAL_TABLET | ORAL | Status: DC | PRN
  Administered 2011-11-06 (×2): 4 mg via ORAL
  Filled 2011-11-06 (×2): qty 2

## 2011-11-06 MED ORDER — ADULT MULTIVITAMIN W/MINERALS CH
1.0000 | ORAL_TABLET | Freq: Every day | ORAL | Status: DC
  Administered 2011-11-06 – 2011-11-10 (×5): 1 via ORAL
  Filled 2011-11-06 (×5): qty 1

## 2011-11-06 MED ORDER — LIDOCAINE HCL (CARDIAC) 20 MG/ML IV SOLN
INTRAVENOUS | Status: DC | PRN
  Administered 2011-11-06: 75 mg via INTRAVENOUS

## 2011-11-06 MED ORDER — GLYCOPYRROLATE 0.2 MG/ML IJ SOLN
INTRAMUSCULAR | Status: DC | PRN
  Administered 2011-11-06: 0.6 mg via INTRAVENOUS

## 2011-11-06 MED ORDER — LACTATED RINGERS IV SOLN
INTRAVENOUS | Status: DC
  Administered 2011-11-06 – 2011-11-07 (×2): via INTRAVENOUS

## 2011-11-06 MED ORDER — SODIUM CHLORIDE 0.9 % IV SOLN
INTRAVENOUS | Status: AC
  Filled 2011-11-06: qty 500

## 2011-11-06 MED ORDER — PANTOPRAZOLE SODIUM 40 MG PO TBEC
40.0000 mg | DELAYED_RELEASE_TABLET | Freq: Two times a day (BID) | ORAL | Status: DC
  Administered 2011-11-06 – 2011-11-10 (×9): 40 mg via ORAL
  Filled 2011-11-06 (×8): qty 1

## 2011-11-06 MED ORDER — PHENYLEPHRINE HCL 10 MG/ML IJ SOLN
INTRAMUSCULAR | Status: DC | PRN
  Administered 2011-11-06: 80 ug via INTRAVENOUS

## 2011-11-06 MED ORDER — NEOSTIGMINE METHYLSULFATE 1 MG/ML IJ SOLN
INTRAMUSCULAR | Status: DC | PRN
  Administered 2011-11-06: 3 mg via INTRAVENOUS

## 2011-11-06 MED ORDER — DOCUSATE SODIUM 100 MG PO CAPS
100.0000 mg | ORAL_CAPSULE | Freq: Two times a day (BID) | ORAL | Status: DC
  Administered 2011-11-06 – 2011-11-10 (×8): 100 mg via ORAL
  Filled 2011-11-06 (×9): qty 1

## 2011-11-06 MED ORDER — PHENYLEPHRINE HCL 10 MG/ML IJ SOLN
10.0000 mg | INTRAVENOUS | Status: DC | PRN
  Administered 2011-11-06: 15 ug/min via INTRAVENOUS

## 2011-11-06 MED ORDER — THROMBIN 5000 UNITS EX SOLR
CUTANEOUS | Status: DC | PRN
  Administered 2011-11-06 (×2): 5000 [IU] via TOPICAL

## 2011-11-06 SURGICAL SUPPLY — 80 items
ADH SKN CLS APL DERMABOND .7 (GAUZE/BANDAGES/DRESSINGS)
APL SKNCLS STERI-STRIP NONHPOA (GAUZE/BANDAGES/DRESSINGS) ×2
BAG DECANTER FOR FLEXI CONT (MISCELLANEOUS) ×2 IMPLANT
BENZOIN TINCTURE PRP APPL 2/3 (GAUZE/BANDAGES/DRESSINGS) ×2 IMPLANT
BIT DRILL ANGLD TIP SCRW 3.5 (BIT) ×1 IMPLANT
BIT DRILL NEURO 2X3.1 SFT TUCH (MISCELLANEOUS) ×1 IMPLANT
BLADE SURG 11 STRL SS (BLADE) ×1 IMPLANT
BLADE SURG ROTATE 9660 (MISCELLANEOUS) IMPLANT
BLADE ULTRA TIP 2M (BLADE) IMPLANT
BRUSH SCRUB EZ 1% IODOPHOR (MISCELLANEOUS) IMPLANT
BRUSH SCRUB EZ PLAIN DRY (MISCELLANEOUS) ×1 IMPLANT
BUR MATCHSTICK NEURO 3.0 LAGG (BURR) ×1 IMPLANT
CANISTER SUCTION 2500CC (MISCELLANEOUS) ×2 IMPLANT
CAP ELLIPSE LOCKING (Cap) ×4 IMPLANT
CLOTH BEACON ORANGE TIMEOUT ST (SAFETY) ×2 IMPLANT
CONT SPEC 4OZ CLIKSEAL STRL BL (MISCELLANEOUS) ×3 IMPLANT
DECANTER SPIKE VIAL GLASS SM (MISCELLANEOUS) ×2 IMPLANT
DERMABOND ADVANCED (GAUZE/BANDAGES/DRESSINGS)
DERMABOND ADVANCED .7 DNX12 (GAUZE/BANDAGES/DRESSINGS) IMPLANT
DRAPE C-ARM 42X72 X-RAY (DRAPES) ×4 IMPLANT
DRAPE LAPAROTOMY 100X72 PEDS (DRAPES) ×2 IMPLANT
DRAPE MICROSCOPE LEICA (MISCELLANEOUS) IMPLANT
DRAPE POUCH INSTRU U-SHP 10X18 (DRAPES) ×2 IMPLANT
DRAPE SURG 17X23 STRL (DRAPES) ×3 IMPLANT
DRILL NEURO 2X3.1 SOFT TOUCH (MISCELLANEOUS) ×2
ELECT BLADE 4.0 EZ CLEAN MEGAD (MISCELLANEOUS) ×2
ELECT REM PT RETURN 9FT ADLT (ELECTROSURGICAL) ×2
ELECTRODE BLDE 4.0 EZ CLN MEGD (MISCELLANEOUS) IMPLANT
ELECTRODE REM PT RTRN 9FT ADLT (ELECTROSURGICAL) ×1 IMPLANT
GAUZE SPONGE 4X4 16PLY XRAY LF (GAUZE/BANDAGES/DRESSINGS) IMPLANT
GLOVE BIO SURGEON STRL SZ8 (GLOVE) ×1 IMPLANT
GLOVE BIO SURGEON STRL SZ8.5 (GLOVE) ×2 IMPLANT
GLOVE BIOGEL PI IND STRL 7.0 (GLOVE) IMPLANT
GLOVE BIOGEL PI INDICATOR 7.0 (GLOVE) ×1
GLOVE EXAM NITRILE LRG STRL (GLOVE) ×1 IMPLANT
GLOVE EXAM NITRILE MD LF STRL (GLOVE) IMPLANT
GLOVE EXAM NITRILE XL STR (GLOVE) IMPLANT
GLOVE EXAM NITRILE XS STR PU (GLOVE) IMPLANT
GLOVE INDICATOR 8.5 STRL (GLOVE) ×1 IMPLANT
GLOVE SS BIOGEL STRL SZ 8 (GLOVE) ×1 IMPLANT
GLOVE SUPERSENSE BIOGEL SZ 8 (GLOVE) ×1
GLOVE SURG SS PI 6.5 STRL IVOR (GLOVE) ×3 IMPLANT
GOWN BRE IMP SLV AUR LG STRL (GOWN DISPOSABLE) ×1 IMPLANT
GOWN BRE IMP SLV AUR XL STRL (GOWN DISPOSABLE) ×3 IMPLANT
GOWN STRL REIN 2XL LVL4 (GOWN DISPOSABLE) ×1 IMPLANT
HEMOSTAT SURGICEL 2X14 (HEMOSTASIS) ×1 IMPLANT
KIT BASIN OR (CUSTOM PROCEDURE TRAY) ×2 IMPLANT
KIT ROOM TURNOVER OR (KITS) ×2 IMPLANT
MARKER PEN SURG W/LABELS BLK (STERILIZATION PRODUCTS) ×1 IMPLANT
NDL HYPO 21X1.5 SAFETY (NEEDLE) IMPLANT
NDL SPNL 18GX3.5 QUINCKE PK (NEEDLE) IMPLANT
NEEDLE HYPO 21X1.5 SAFETY (NEEDLE) ×2 IMPLANT
NEEDLE HYPO 22GX1.5 SAFETY (NEEDLE) ×2 IMPLANT
NEEDLE SPNL 18GX3.5 QUINCKE PK (NEEDLE) IMPLANT
NS IRRIG 1000ML POUR BTL (IV SOLUTION) ×2 IMPLANT
PACK LAMINECTOMY NEURO (CUSTOM PROCEDURE TRAY) ×2 IMPLANT
PAD ARMBOARD 7.5X6 YLW CONV (MISCELLANEOUS) ×6 IMPLANT
PATTIES SURGICAL .25X.25 (GAUZE/BANDAGES/DRESSINGS) IMPLANT
PIN MAYFIELD SKULL DISP (PIN) ×2 IMPLANT
PUTTY 5ML ACTIFUSE ABX (Putty) ×1 IMPLANT
ROD SPINE POST 3.5X80 (Rod) ×1 IMPLANT
RUBBERBAND STERILE (MISCELLANEOUS) IMPLANT
SCREW ELLIPSE 14X3.5MM (Screw) ×4 IMPLANT
SPONGE GAUZE 4X4 12PLY (GAUZE/BANDAGES/DRESSINGS) ×1 IMPLANT
SPONGE LAP 4X18 X RAY DECT (DISPOSABLE) IMPLANT
SPONGE NEURO XRAY DETECT 1X3 (DISPOSABLE) IMPLANT
SPONGE SURGIFOAM ABS GEL SZ50 (HEMOSTASIS) ×1 IMPLANT
STAPLER SKIN PROX WIDE 3.9 (STAPLE) ×1 IMPLANT
STRIP CLOSURE SKIN 1/2X4 (GAUZE/BANDAGES/DRESSINGS) ×2 IMPLANT
SUT ETHILON 2 0 FS 18 (SUTURE) IMPLANT
SUT VIC AB 0 CT1 18XCR BRD8 (SUTURE) ×1 IMPLANT
SUT VIC AB 0 CT1 8-18 (SUTURE) ×2
SUT VIC AB 2-0 CP2 18 (SUTURE) ×2 IMPLANT
SYR 20CC LL (SYRINGE) ×1 IMPLANT
SYR 20ML ECCENTRIC (SYRINGE) ×2 IMPLANT
TAPE CLOTH SURG 4X10 WHT LF (GAUZE/BANDAGES/DRESSINGS) ×1 IMPLANT
TOWEL OR 17X24 6PK STRL BLUE (TOWEL DISPOSABLE) ×2 IMPLANT
TOWEL OR 17X26 10 PK STRL BLUE (TOWEL DISPOSABLE) ×2 IMPLANT
TRAY FOLEY CATH 14FRSI W/METER (CATHETERS) IMPLANT
WATER STERILE IRR 1000ML POUR (IV SOLUTION) ×2 IMPLANT

## 2011-11-06 NOTE — Plan of Care (Signed)
Problem: Consults Goal: Diagnosis - Spinal Surgery Outcome: Progressing Microdiscectomy     

## 2011-11-06 NOTE — Op Note (Signed)
Brief history: The patient is a 78 year old white male who I performed a C6-7 anterior cervicectomy fusion and plating on back in December 2012 for a cervical myelopathy. The patient has taken a few falls and was worked up with a cervical MRI. It demonstrated continued posterior compression of the spinal cord at C6-7. I discussed the various treatment options with the patient and his family. I recommended he undergo a C6-7 laminectomy fusion and plating to further decompress his spinal cord. The patient has weighed the risks, benefits, and alternatives surgery and decided proceed with the operation.  Preop diagnosis: C6-7 spinal stenosis, cervical myelopathy, cervicalgia  Postop diagnosis: The same  Procedure: C6 and C7 decompressive laminectomy using microdissection, C6-7 posterior lateral arthrodesis with local morselized autograft bone and Actifuse bone graft extender, C6-7 posterior is rotation with globus titanium lateral mass screws and rods.  Surgeon: Dr. Delma Officer  Assistant: Dr. Jillyn Hidden cram  Anesthesia: General endotracheal  Estimated blood loss: 75 cc  Specimens: None  Drains: None  Complications: None  Description of procedure: The patient was brought to the operating room by the anesthesia team. General endotracheal anesthesia was induced. I applied the [place] 3 point headrest the patient's calvarium. The patient was then turned to the prone position on the chest rolls. His posterior cervical and upper thoracic region was then prepared with Betadine scrub and Betadine solution. Sterile drapes were applied. I then injected the area to size with Marcaine with epinephrine solution. I used a scalpel to make a linear midline incision over the patient's cervical thoracic junction. I used a cautery to perform a bilateral subperiosteal dissection exposing the spinous process and lamina of C5 C6-C7 and T1. We obtained intraoperative radiograph to confirm our location. I then inserted the  [facility] retractor for exposure.  We began the decompression by incising the interspinous ligament at C7-T1 C6-7 C5-6 with a scalpel. I then used Leksell nodule were to remove the spinous process of C6 and C7. I then used the high-speed drill to drill away the lamina at C6 and C7. We then brought the operative microscope into the field and under its medication and illumination I completed the microdissection/decompression. I used Accurso punch removed the ligament flavum at C5-6, C6-7 and C7-T1 decompressing the thecal sac. I widen the laminectomies with Kerrison punch further decompressing the spinal cord.  Having completed the decompression we now turned attention to the instrumentation. We were not able to visualize C67 with the fluoroscope because the patient's shoulders. We therefore used standard anatomic landmarks. Identified the Center of the lateral mass of C6 and C7 bilaterally I then drilled a 14 number hold in a cephalad and lateral traction. We then removed the drill and I probed inside each drill hole with a ball probe to rule out cortical breaches. I then inserted a 14 mm screw in the lateral masses bilaterally at C6 and C7. We got good bony purchase. We then cut a rod appropriate length and secured the unilateral screws with a rod. We placed the caps and tightened appropriately. This completed the instrumentation.  We now turned attention to the posterior lateral arthrodesis. I used a high-speed drill to decorticate the remainder of the lateral masses and lateral lamina of C6 and 7 bilaterally. I then laid a combination of local morselized autograft bone that we obtained during decompression as well as Actifuse bone graft extender over these decorticated posterior lateral structures. This completed the posterior lateral arthrodesis.  We then obtained hemostasis using bipolar cautery. We  then irrigated with bacitracin solution. We removed the retractors. We then reapproximated patient's  cervical thoracic fascia with interrupted #1 Vicryl suture. We reapproximated the subcutaneous suture up to a rectal suture. We then reapproximated the skin was Steri-Strips and benzoin. The wound was then coated with bacitracin ointment. A sterile dressing was applied. The drapes were removed.  The patient was subtotally returned to the supine position where I removed the [place] 3 point headrest from his calvarium. The patient was subtotally extubated and transported to the post anesthesia care unit in stable condition. All sponge instrument and needle counts were reportedly correct at the end this case.

## 2011-11-06 NOTE — H&P (Signed)
Subjective: The patient is a 78 year old white male who I performed a C6-7 anterior cervicectomy fusion and plating on 01/16/2011. Postoperatively the patient developed a epidural hematoma and had this evacuated on 01/23/2011. The patient went to rehabilitation and made a good recovery.  More recently the patient has taken several falls. He has had some generalized weakness. He was worked up with a cervical MRI which demonstrated some posterior compression at C6-7. I discussed the various treatment option with the patient and his family including surgery. Patient has weighed the risks, benefits, and alternatives surgery and decided proceed with a cervical laminectomy fusion and instrumentation.  Past Medical History  Diagnosis Date  . GERD (gastroesophageal reflux disease)   . High cholesterol   . Enlarged prostate   . High cholesterol   . Hypertension     no longer takes bp med-resolved.    Past Surgical History  Procedure Date  . Joint replacement 2001  and 2002    both knees  . Laminectomy 08/2010  . Back surgery   . Replacement total knee   . Cholecystectomy 11/01/10  . Anterior cervical decomp/discectomy fusion 01/16/2011    Procedure: ANTERIOR CERVICAL DECOMPRESSION/DISCECTOMY FUSION 1 LEVEL;  Surgeon: Cristi Loron;  Location: [facility];  Service: Neurosurgery;  Laterality: N/A;  Cervical six-seven  Anterior Cervical Decomp[ression Fusion  . Anterior cervical decomp/discectomy fusion 01/23/2011    Procedure: ANTERIOR CERVICAL DECOMPRESSION/DISCECTOMY FUSION 1 LEVEL/HARDWARE REMOVAL;  Surgeon: Cristi Loron;  Location: [facility];  Service: Neurosurgery;  Laterality: N/A;  Evacuation of Cervical Six-Seven Hematoma  . Eye surgery 2008    Allergies  Allergen Reactions  . Vicodin (Hydrocodone-Acetaminophen) Hives    On neck and chest.    History  Substance Use Topics  . Smoking status: Former Smoker -- 0.5 packs/day for 2 years    Types: Cigarettes    Quit date:  02/14/1971  . Smokeless tobacco: Not on file  . Alcohol Use: No    History reviewed. No pertinent family history. Prior to Admission medications   Medication Sig Start Date End Date Taking? Authorizing Provider  aspirin 81 MG EC tablet Take 81 mg by mouth daily.    Yes Historical Provider, MD  cetirizine (ZYRTEC) 10 MG tablet Take 5 mg by mouth 2 (two) times daily.   Yes Historical Provider, MD  citalopram (CELEXA) 40 MG tablet Take 40 mg by mouth daily.   Yes Historical Provider, MD  Docusate Calcium (STOOL SOFTENER PO) Take 1 tablet by mouth at bedtime.   Yes Historical Provider, MD  Multiple Vitamin (MULTIVITAMIN WITH MINERALS) TABS Take 1 tablet by mouth daily.   Yes Historical Provider, MD  pantoprazole (PROTONIX) 40 MG tablet Take 40 mg by mouth 2 (two) times daily.   Yes Historical Provider, MD  Probiotic Product (PROBIOTIC DAILY PO) Take 1 tablet by mouth daily.   Yes Historical Provider, MD  senna (SENOKOT) 8.6 MG TABS Take 1 tablet by mouth 2 (two) times daily as needed. For constipation.   Yes Historical Provider, MD  simethicone (MYLICON) 80 MG chewable tablet Chew 80 mg by mouth 3 (three) times daily.   Yes Historical Provider, MD  sulfamethoxazole-trimethoprim (BACTRIM DS) 800-160 MG per tablet Take 1 tablet by mouth 2 (two) times daily.   Yes Historical Provider, MD  Tamsulosin HCl (FLOMAX) 0.4 MG CAPS Take 0.4 mg by mouth daily.    Yes Historical Provider, MD     Review of Systems  Positive ROS: As above  All  other systems have been reviewed and were otherwise negative with the exception of those mentioned in the HPI and as above.  Objective: Vital signs in last 24 hours: Temp:  [98.5 F (36.9 C)] 98.5 F (36.9 C) (09/23 0812) Pulse Rate:  [81] 81  (09/23 0812) Resp:  [20] 20  (09/23 0812) BP: (128)/(73) 128/73 mmHg (09/23 0812) SpO2:  [98 %] 98 % (09/23 0812)  General Appearance: Alert, cooperative, no distress, appears stated age Head: Normocephalic, without  obvious abnormality, atraumatic Eyes: PERRL, conjunctiva/corneas clear, EOM's intact, fundi benign, both eyes      Ears: Normal TM's and external ear canals, both ears Throat: Lips, mucosa, and tongue normal; teeth and gums normal Neck: Supple, symmetrical, trachea midline, no adenopathy; thyroid: No enlargement/tenderness/nodules; no carotid bruit or JVD the patient's anterior cervical incision is well-healed. Back: Symmetric, no curvature, ROM normal, no CVA tenderness Lungs: Clear to auscultation bilaterally, respirations unlabored Heart: Regular rate and rhythm, S1 and S2 normal, no murmur, rub or gallop Abdomen: Soft, non-tender, bowel sounds active all four quadrants, no masses, no organomegaly Extremities: Extremities normal, atraumatic, no cyanosis or edema Pulses: 2+ and symmetric all extremities Skin: Skin color, texture, turgor normal, no rashes or lesions  NEUROLOGIC:   Mental status: alert and oriented, no aphasia, good attention span, Fund of knowledge/ memory ok Motor Exam - grossly normal the patient has some slight weakness in his hands bilaterally and in his left dorsiflexor.. Sensory Exam - grossly normal Reflexes: The patient is hyperreflexic in his lower extremities. Coordination - grossly normal Gait - grossly normal Balance - grossly normal Cranial Nerves: I: smell Not tested  II: visual acuity  OS: Normal    OD: Normal   II: visual fields Full to confrontation  II: pupils Equal, round, reactive to light  III,VII: ptosis None  III,IV,VI: extraocular muscles  Full ROM  V: mastication Normal  V: facial light touch sensation  Normal  V,VII: corneal reflex  Present  VII: facial muscle function - upper  Normal  VII: facial muscle function - lower Normal  VIII: hearing Not tested  IX: soft palate elevation  Normal  IX,X: gag reflex Present  XI: trapezius strength  5/5  XI: sternocleidomastoid strength 5/5  XI: neck flexion strength  5/5  XII: tongue strength   Normal    Data Review Lab Results  Component Value Date   WBC 7.0 11/02/2011   HGB 14.7 11/02/2011   HCT 43.4 11/02/2011   MCV 83.5 11/02/2011   PLT 215 11/02/2011   Lab Results  Component Value Date   NA 138 11/02/2011   K 3.7 11/02/2011   CL 102 11/02/2011   CO2 26 11/02/2011   BUN 16 11/02/2011   CREATININE 1.09 11/02/2011   GLUCOSE 91 11/02/2011   Lab Results  Component Value Date   INR 1.03 01/25/2011    Assessment/Plan: C6-7 stenosis, cervical myelopathy, cervicalgia: I discussed situation with the patient. I reviewed his MR scan with them and pointed out the abnormalities. We have discussed the various treatment options including surgery. I described the surgical option of a C6 and C7 laminectomy with instrumentation and fusion. I've shown him surgical models. We have discussed the risks, benefits, alternatives and likelihood of achieving our goals with surgery. The patient has decided to proceed with the operation.   Ismahan Lippman D 11/06/2011 9:08 AM

## 2011-11-06 NOTE — Anesthesia Postprocedure Evaluation (Signed)
  Anesthesia Post-op Note  Patient: Ricky Bradley  Procedure(s) Performed: Procedure(s) (LRB) with comments: POSTERIOR CERVICAL FUSION/FORAMINOTOMY LEVEL 1 (N/A) - C67 laminectomy with posterior cervical fusion with instrumentation  Patient Location: PACU  Anesthesia Type: General  Level of Consciousness: awake  Airway and Oxygen Therapy: Patient Spontanous Breathing  Post-op Pain: mild  Post-op Assessment: Post-op Vital signs reviewed  Post-op Vital Signs: Reviewed  Complications: No apparent anesthesia complications

## 2011-11-06 NOTE — Transfer of Care (Signed)
Immediate Anesthesia Transfer of Care Note  Patient: Ricky Bradley  Procedure(s) Performed: Procedure(s) (LRB) with comments: POSTERIOR CERVICAL FUSION/FORAMINOTOMY LEVEL 1 (N/A) - C67 laminectomy with posterior cervical fusion with instrumentation  Patient Location: PACU  Anesthesia Type: General  Level of Consciousness: awake and alert   Airway & Oxygen Therapy: Patient Spontanous Breathing and Patient connected to nasal cannula oxygen  Post-op Assessment: Report given to PACU RN and Post -op Vital signs reviewed and stable  Post vital signs: Reviewed and stable  Complications: No apparent anesthesia complications

## 2011-11-06 NOTE — Preoperative (Signed)
Beta Blockers   Reason not to administer Beta Blockers:Not Applicable 

## 2011-11-06 NOTE — Progress Notes (Signed)
Subjective:  The patient is alert and pleasant. He looks well. He has no complaints.  Objective: Vital signs in last 24 hours: Temp:  [97.2 F (36.2 C)-98.5 F (36.9 C)] 97.6 F (36.4 C) (09/23 1600) Pulse Rate:  [64-91] 88  (09/23 1600) Resp:  [14-27] 14  (09/23 1600) BP: (124-164)/(60-88) 146/76 mmHg (09/23 1600) SpO2:  [98 %-100 %] 100 % (09/23 1600) Weight:  [78.8 kg (173 lb 11.6 oz)] 78.8 kg (173 lb 11.6 oz) (09/23 1527)  Intake/Output from previous day:   Intake/Output this shift: Total I/O In: 1550 [I.V.:1550] Out: 900 [Urine:900]  Physical exam the patient is alert and oriented. He is moving all 4 extremities well without any obvious weakness. His dressing is clean and dry.  Lab Results: No results found for this basename: WBC:2,HGB:2,HCT:2,PLT:2 in the last 72 hours BMET No results found for this basename: NA:2,K:2,CL:2,CO2:2,GLUCOSE:2,BUN:2,CREATININE:2,CALCIUM:2 in the last 72 hours  Studies/Results: Dg Cervical Spine 1 View  11/06/2011  *RADIOLOGY REPORT*  Clinical Data: C6-7 posterior cervical fusion  DG CERVICAL SPINE - 1 VIEW  Comparison: CT cervical spine dated 09/26/2011  Findings: Cervical spine is visualized to the bottom of C5 on this single lateral view.  Surgical instruments posterior to C5-6.  Moderate multilevel degenerate changes with bridging osteophytes anteriorly.  IMPRESSION: Surgical instruments posterior to C5-6.   Original Report Authenticated By: Charline Bills, M.D.     Assessment/Plan: The patient is doing well.  LOS: 0 days     Dillin Lofgren D 11/06/2011, 4:41 PM

## 2011-11-06 NOTE — Anesthesia Preprocedure Evaluation (Addendum)
Anesthesia Evaluation  Patient identified by MRN, date of birth, ID band Patient awake    Reviewed: Allergy & Precautions, H&P , NPO status , Patient's Chart, lab work & pertinent test results  Airway Mallampati: II      Dental   Pulmonary neg pulmonary ROS,  breath sounds clear to auscultation        Cardiovascular hypertension, Pt. on medications Rhythm:Regular Rate:Normal     Neuro/Psych negative neurological ROS     GI/Hepatic Neg liver ROS, GERD-  Controlled,  Endo/Other  negative endocrine ROS  Renal/GU negative Renal ROS     Musculoskeletal   Abdominal   Peds  Hematology   Anesthesia Other Findings   Reproductive/Obstetrics                          Anesthesia Physical Anesthesia Plan  ASA: III  Anesthesia Plan: General   Post-op Pain Management:    Induction: Intravenous  Airway Management Planned: Oral ETT  Additional Equipment:   Intra-op Plan:   Post-operative Plan: Extubation in OR  Informed Consent: I have reviewed the patients History and Physical, chart, labs and discussed the procedure including the risks, benefits and alternatives for the proposed anesthesia with the patient or authorized representative who has indicated his/her understanding and acceptance.     Plan Discussed with: CRNA, Anesthesiologist and Surgeon  Anesthesia Plan Comments:         Anesthesia Quick Evaluation

## 2011-11-06 NOTE — Progress Notes (Signed)
Subjective:  The patient is alert and pleasant. He looks well. He is in no apparent distress.  Objective: Vital signs in last 24 hours: Temp:  [97.6 F (36.4 C)-98.5 F (36.9 C)] 97.6 F (36.4 C) (09/23 1221) Pulse Rate:  [64-81] 64  (09/23 1230) Resp:  [20-25] 24  (09/23 1230) BP: (128-151)/(73-80) 151/80 mmHg (09/23 1221) SpO2:  [98 %-100 %] 99 % (09/23 1230)  Intake/Output from previous day:   Intake/Output this shift: Total I/O In: 1000 [I.V.:1000] Out: -   Physical exam the patient is alert and moving all 4 extremities well. I note no weakness. The patient's dressing is clean and dry.  Lab Results: No results found for this basename: WBC:2,HGB:2,HCT:2,PLT:2 in the last 72 hours BMET No results found for this basename: NA:2,K:2,CL:2,CO2:2,GLUCOSE:2,BUN:2,CREATININE:2,CALCIUM:2 in the last 72 hours  Studies/Results: No results found.  Assessment/Plan: The patient is doing well.  LOS: 0 days     Avrey Hyser D 11/06/2011, 12:55 PM

## 2011-11-06 NOTE — Anesthesia Procedure Notes (Signed)
Procedure Name: Intubation Date/Time: 11/06/2011 9:30 AM Performed by: Gwenyth Allegra Pre-anesthesia Checklist: Patient identified, Timeout performed, Emergency Drugs available, Suction available and Patient being monitored Patient Re-evaluated:Patient Re-evaluated prior to inductionOxygen Delivery Method: Circle system utilized Preoxygenation: Pre-oxygenation with 100% oxygen Intubation Type: IV induction Ventilation: Mask ventilation without difficulty and Oral airway inserted - appropriate to patient size Laryngoscope Size: Mac and 3 Grade View: Grade III Tube type: Oral Tube size: 8.0 mm Number of attempts: 1 Airway Equipment and Method: Stylet Placement Confirmation: ETT inserted through vocal cords under direct vision,  breath sounds checked- equal and bilateral and positive ETCO2 Secured at: 22 cm Tube secured with: Tape Dental Injury: Teeth and Oropharynx as per pre-operative assessment

## 2011-11-07 ENCOUNTER — Encounter (HOSPITAL_COMMUNITY): Payer: Self-pay | Admitting: Neurosurgery

## 2011-11-07 NOTE — Evaluation (Signed)
Physical Therapy Evaluation Patient Details Name: Ricky Bradley MRN: [id number] DOB: 13-Jul-1933 Today's Date: 11/07/2011 Time: 7829-5621 PT Time Calculation (min): 27 min  PT Assessment / Plan / Recommendation Clinical Impression  pt presents with C6-7 Lami.  pt generally weak and debilitated.  pt needs A for all aspects of mobility and would benefit from continued therapy at D/C.      PT Assessment  Patient needs continued PT services    Follow Up Recommendations  Inpatient Rehab    Barriers to Discharge None      Equipment Recommendations  None recommended by PT    Recommendations for Other Services Rehab consult   Frequency Min 5X/week    Precautions / Restrictions Precautions Precautions: Fall;Cervical Required Braces or Orthoses: Cervical Brace Cervical Brace: Hard collar Restrictions Weight Bearing Restrictions: No   Pertinent Vitals/Pain 4/10 Neck and shoulders.        Mobility  Bed Mobility Bed Mobility: Supine to Sit;Sitting - Scoot to Edge of Bed Supine to Sit: 3: Mod assist;HOB elevated Sitting - Scoot to Edge of Bed: 3: Mod assist Details for Bed Mobility Assistance: cues for sequencing Transfers Transfers: Sit to Stand;Stand to Sit;Stand Pivot Transfers Sit to Stand: 1: +2 Total assist;With upper extremity assist;From bed;From chair/3-in-1;With armrests Sit to Stand: Patient Percentage: 60% Stand to Sit: 1: +2 Total assist;With upper extremity assist;With armrests;To chair/3-in-1 Stand to Sit: Patient Percentage: 60% Stand Pivot Transfers: 1: +2 Total assist Stand Pivot Transfers: Patient Percentage: 60% Details for Transfer Assistance: cues for use of UEs, anterior wt shift with sit to stand, pt able to complete 70% when standing from chair.   Ambulation/Gait Ambulation/Gait Assistance: Not tested (comment) Stairs: No Wheelchair Mobility Wheelchair Mobility: No    Exercises     PT Diagnosis: Difficulty walking;Acute pain;Generalized weakness  PT  Problem List: Decreased strength;Decreased activity tolerance;Decreased balance;Decreased mobility;Decreased knowledge of use of DME;Decreased coordination;Pain PT Treatment Interventions: DME instruction;Gait training;Stair training;Functional mobility training;Therapeutic activities;Therapeutic exercise;Balance training;Neuromuscular re-education;Patient/family education   PT Goals Acute Rehab PT Goals PT Goal Formulation: With patient Time For Goal Achievement: 11/21/11 Potential to Achieve Goals: Good Pt will go Supine/Side to Sit: with modified independence PT Goal: Supine/Side to Sit - Progress: Goal set today Pt will go Sit to Supine/Side: with modified independence PT Goal: Sit to Supine/Side - Progress: Goal set today Pt will go Sit to Stand: with modified independence PT Goal: Sit to Stand - Progress: Goal set today Pt will go Stand to Sit: with modified independence PT Goal: Stand to Sit - Progress: Goal set today Pt will Ambulate: >150 feet;with supervision;with rolling walker PT Goal: Ambulate - Progress: Goal set today Pt will Go Up / Down Stairs: 3-5 stairs;with supervision;with rail(s) PT Goal: Up/Down Stairs - Progress: Goal set today  Visit Information  Last PT Received On: 11/07/11 Assistance Needed: +2 PT/OT Co-Evaluation/Treatment: Yes    Subjective Data  Subjective: Last time I had a leg bag for 3 weeks.   Patient Stated Goal: Home   Prior Functioning  Home Living Lives With: Spouse Available Help at Discharge: Family;Other (Comment) (wife is in able to assist minimally) Type of Home: House Home Access: Stairs to enter [employer] of Steps: 3-4 Entrance Stairs-Rails: Can reach both;Right;Left Home Layout: Two level;Able to live on main level with bedroom/bathroom Bathroom Shower/Tub: Tub/shower unit;Curtain Bathroom Toilet: Standard Bathroom Accessibility: No Home Adaptive Equipment: Bedside commode/3-in-1;Tub transfer bench;Walker -  rolling;Straight cane;Grab bars in shower Prior Function Level of Independence: Independent with assistive device(s) Able  to Take Stairs?: Yes Driving: Yes Vocation: Retired Musician: No difficulties Dominant Hand: Right    Cognition  Overall Cognitive Status: Appears within functional limits for tasks assessed/performed Arousal/Alertness: Awake/alert Orientation Level: Appears intact for tasks assessed Behavior During Session: [facility] for tasks performed    Extremity/Trunk Assessment Right Lower Extremity Assessment RLE ROM/Strength/Tone: Deficits RLE ROM/Strength/Tone Deficits: Grossly 4/5 RLE Sensation: WFL - Light Touch Left Lower Extremity Assessment LLE ROM/Strength/Tone: Deficits LLE ROM/Strength/Tone Deficits: Grossly 4/5 LLE Sensation: WFL - Light Touch Trunk Assessment Trunk Assessment: Kyphotic   Balance Balance Balance Assessed: Yes Static Standing Balance Static Standing - Balance Support: Bilateral upper extremity supported;During functional activity Static Standing - Level of Assistance: 4: Min assist;3: Mod assist Static Standing - Comment/# of Minutes: pt fluctuates between MinA and ModA for standing balance while trying to use the urinal.    End of Session PT - End of Session Equipment Utilized During Treatment: Gait belt;Cervical collar Activity Tolerance: Patient tolerated treatment well Patient left: in chair;with call bell/phone within reach Nurse Communication: Mobility status  GP     RitenourAlison Murray, [place] [phone] 11/07/2011, 9:33 AM

## 2011-11-07 NOTE — Evaluation (Signed)
Occupational Therapy Evaluation Patient Details Name: Ricky Bradley MRN: [id number] DOB: 1933-11-19 Today's Date: 11/07/2011 Time: 4098-1191 OT Time Calculation (min): 36 min  OT Assessment / Plan / Recommendation Clinical Impression  Pt admitted for C6-7 fusion.  Pt currently requires 2 person assist for mobility, but anticipate he will progress steadily.  Will follow acutely to address ADL and transfers fro ADL.  Pt will likely need post acute rehab.    OT Assessment  Patient needs continued OT Services    Follow Up Recommendations  Inpatient Rehab    Barriers to Discharge      Equipment Recommendations  None recommended by PT    Recommendations for Other Services Rehab consult  Frequency  Min 2X/week    Precautions / Restrictions Precautions Precautions: Fall;Cervical Required Braces or Orthoses: Cervical Brace Cervical Brace: Hard collar Restrictions Weight Bearing Restrictions: No   Pertinent Vitals/Pain 4/10 neck, premedicated    ADL  Eating/Feeding: Performed;Set up Where Assessed - Eating/Feeding: Bed level Grooming: Performed;Teeth care;Wash/dry face;Set up Where Assessed - Grooming: Supported sitting Upper Body Bathing: Simulated;Minimal assistance Where Assessed - Upper Body Bathing: Supported sitting Lower Body Bathing: Simulated;+2 Total assistance Lower Body Bathing: Patient Percentage: 50% Where Assessed - Lower Body Bathing: Supported sit to stand Upper Body Dressing: Simulated;Minimal assistance Where Assessed - Upper Body Dressing: Unsupported sitting Lower Body Dressing: Performed;+2 Total assistance Lower Body Dressing: Patient Percentage: 50% Where Assessed - Lower Body Dressing: Unsupported sitting;Supported sit to stand Equipment Used: Gait belt Transfers/Ambulation Related to ADLs: Transferred to chair with +2 total assist, pt60 % ADL Comments: Pt stood to urinate in urinal x 2.    OT Diagnosis: Generalized weakness;Acute pain  OT Problem  List: Decreased activity tolerance;Impaired balance (sitting and/or standing);Cardiopulmonary status limiting activity;Pain OT Treatment Interventions: Self-care/ADL training;Therapeutic activities;Patient/family education;Balance training   OT Goals Acute Rehab OT Goals OT Goal Formulation: With patient Time For Goal Achievement: 11/21/11 Potential to Achieve Goals: Good ADL Goals Pt Will Perform Grooming: with supervision;Standing at sink ADL Goal: Grooming - Progress: Goal set today Pt Will Perform Upper Body Bathing: with supervision;Sitting, edge of bed ADL Goal: Upper Body Bathing - Progress: Goal set today Pt Will Perform Lower Body Bathing: with min assist;Sit to stand from bed ADL Goal: Lower Body Bathing - Progress: Goal set today Pt Will Perform Upper Body Dressing: with set-up;Sitting, bed ADL Goal: Upper Body Dressing - Progress: Goal set today Pt Will Perform Lower Body Dressing: with min assist;Sit to stand from bed ADL Goal: Lower Body Dressing - Progress: Goal set today Pt Will Transfer to Toilet: with supervision;Ambulation;with DME;3-in-1 ADL Goal: Toilet Transfer - Progress: Goal set today Pt Will Perform Toileting - Clothing Manipulation: with supervision;Standing ADL Goal: Toileting - Clothing Manipulation - Progress: Goal set today Pt Will Perform Toileting - Hygiene: with supervision;Leaning right and/or left on 3-in-1/toilet ADL Goal: Toileting - Hygiene - Progress: Goal set today Pt Will Perform Tub/Shower Transfer: Tub transfer;Ambulation;with DME;Transfer tub bench;with supervision ADL Goal: Tub/Shower Transfer - Progress: Goal set today  Visit Information  Last OT Received On: 11/07/11 Assistance Needed: +2 PT/OT Co-Evaluation/Treatment: Yes    Subjective Data  Subjective: "I fell once." Patient Stated Goal: Eventually return home with wife.   Prior Functioning  Vision/Perception  Home Living Lives With: Spouse Available Help at Discharge:  Family;Other (Comment) (wife is in able to assist minimally) Type of Home: House Home Access: Stairs to enter [employer] of Steps: 3-4 Entrance Stairs-Rails: Can reach both;Right;Left Home Layout: Two level;Able to  live on main level with bedroom/bathroom Bathroom Shower/Tub: Tub/shower unit;Curtain Firefighter: Standard Bathroom Accessibility: No Home Adaptive Equipment: Bedside commode/3-in-1;Tub transfer bench;Walker - rolling;Straight cane;Grab bars in shower Prior Function Level of Independence: Independent with assistive device(s) Able to Take Stairs?: Yes Driving: Yes Vocation: Retired Comments: Pt was going to [employer] prior to surgery. Communication Communication: No difficulties Dominant Hand: Right      Cognition  Overall Cognitive Status: Appears within functional limits for tasks assessed/performed Arousal/Alertness: Awake/alert Orientation Level: Appears intact for tasks assessed Behavior During Session: [facility] for tasks performed    Extremity/Trunk Assessment Right Upper Extremity Assessment RUE ROM/Strength/Tone: [facility] for tasks assessed;Unable to fully assess;Due to precautions (pt describes hand as feeling tight) Left Upper Extremity Assessment LUE ROM/Strength/Tone: [facility] for tasks assessed;Unable to fully assess;Due to precautions (pt describes hand as feeling tight) Right Lower Extremity Assessment RLE ROM/Strength/Tone: Deficits RLE ROM/Strength/Tone Deficits: Grossly 4/5 RLE Sensation: WFL - Light Touch Left Lower Extremity Assessment LLE ROM/Strength/Tone: Deficits LLE ROM/Strength/Tone Deficits: Grossly 4/5 LLE Sensation: WFL - Light Touch Trunk Assessment Trunk Assessment: Kyphotic   Mobility  Shoulder Instructions  Bed Mobility Bed Mobility: Supine to Sit;Sitting - Scoot to Edge of Bed Supine to Sit: 3: Mod assist;HOB elevated Sitting - Scoot to Edge of Bed: 3: Mod assist Details for Bed Mobility Assistance: cues for  sequencing Transfers Transfers: Sit to Stand;Stand to Sit Sit to Stand: 1: +2 Total assist;With upper extremity assist;From bed;From chair/3-in-1;With armrests Sit to Stand: Patient Percentage: 60% Stand to Sit: 1: +2 Total assist;With upper extremity assist;With armrests;To chair/3-in-1 Stand to Sit: Patient Percentage: 60% Details for Transfer Assistance: cues for use of UEs, anterior wt shift with sit to stand, pt able to complete 70% when standing from chair.         Exercise     Balance Balance Balance Assessed: Yes Static Sitting Balance Static Sitting - Balance Support: Feet supported Static Sitting - Level of Assistance: 5: Stand by assistance Static Standing Balance Static Standing - Balance Support: Bilateral upper extremity supported;During functional activity Static Standing - Level of Assistance: 4: Min assist;3: Mod assist Static Standing - Comment/# of Minutes: pt fluctuates between MinA and ModA for standing balance while trying to use the urinal.     End of Session OT - End of Session Activity Tolerance: Treatment limited secondary to medical complications (Comment) (tachy with activity, RN in room) Patient left: in chair;with call bell/phone within reach;with nursing in room Nurse Communication: Other (comment);Mobility status (heart rate)  GO     Evern Bio 11/07/2011, 10:48 AM [phone]

## 2011-11-07 NOTE — Care Management Note (Signed)
    Page 1 of 1   11/10/2011     2:32:49 PM   CARE MANAGEMENT NOTE 11/10/2011  Patient:  Ricky Bradley, Ricky Bradley   Account Number:  [id number]  Date Initiated:  11/07/2011  Documentation initiated by:  [facility]  Subjective/Objective Assessment:   post op lam.  has spouse.     Action/Plan:   Anticipated DC Date:  11/09/2011   Anticipated DC Plan:  IP REHAB FACILITY      DC Planning Services  CM consult      Choice offered to / List presented to:             Status of service:  In process, will continue to follow Medicare Important Message given?   (If response is "NO", the following Medicare IM given date fields will be blank) Date Medicare IM given:   Date Additional Medicare IM given:    Discharge Disposition:    Per UR Regulation:  Reviewed for med. necessity/level of care/duration of stay  If discussed at Long Length of Stay Meetings, dates discussed:    Comments:  ContactWilloughby, [place]ell [phone]                 [facility] Daughter [phone]                 Desiree, Fleming [phone]   [phone]  11/10/11 Onnie Boer, RN, BSN 1432 PT IS AWAITING SNF PLACEMENT  11-07-11 1:15pm Avie Arenas, RNBSN [phone] PT/OT recommending in house rehab - need rehab consult ordered.

## 2011-11-07 NOTE — Progress Notes (Signed)
Patient ID: Ricky Bradley, male   DOB: 1933-05-04, 78 y.o.   MRN: [id number] Subjective:  The patient is alert and pleasant. His neck is appropriately sore. He looks well.  Objective: Vital signs in last 24 hours: Temp:  [97.2 F (36.2 C)-98.8 F (37.1 C)] 98 F (36.7 C) (09/24 0330) Pulse Rate:  [64-96] 87  (09/24 0700) Resp:  [13-27] 17  (09/24 0700) BP: (109-164)/(48-88) 131/67 mmHg (09/24 0700) SpO2:  [98 %-100 %] 99 % (09/24 0700) Weight:  [78.8 kg (173 lb 11.6 oz)] 78.8 kg (173 lb 11.6 oz) (09/23 1527)  Intake/Output from previous day: 09/23 0701 - 09/24 0700 In: 2609 [I.V.:2507; IV Piggyback:102] Out: 2430 [Urine:2430] Intake/Output this shift:    Physical exam the patient is alert and oriented. He is moving all 4 extremities well with no obvious weakness.  Lab Results: No results found for this basename: WBC:2,HGB:2,HCT:2,PLT:2 in the last 72 hours BMET No results found for this basename: NA:2,K:2,CL:2,CO2:2,GLUCOSE:2,BUN:2,CREATININE:2,CALCIUM:2 in the last 72 hours  Studies/Results: Dg Cervical Spine 1 View  11/06/2011  *RADIOLOGY REPORT*  Clinical Data: C6-7 posterior cervical fusion  DG CERVICAL SPINE - 1 VIEW  Comparison: CT cervical spine dated 09/26/2011  Findings: Cervical spine is visualized to the bottom of C5 on this single lateral view.  Surgical instruments posterior to C5-6.  Moderate multilevel degenerate changes with bridging osteophytes anteriorly.  IMPRESSION: Surgical instruments posterior to C5-6.   Original Report Authenticated By: Charline Bills, M.D.     Assessment/Plan: Postop day #1: The patient is doing well. I will transfer him to the floor. We will have PT and OT see the patient. He will likely go home in a few days.  LOS: 1 day     Elgar Scoggins D 11/07/2011, 8:18 AM

## 2011-11-07 NOTE — Clinical Social Work Psychosocial (Signed)
     Clinical Social Work Department BRIEF PSYCHOSOCIAL ASSESSMENT 11/07/2011  Patient:  Ricky Bradley, Ricky Bradley     Account Number:  [id number]     Admit date:  11/06/2011  Clinical Social Worker:  Margaree Mackintosh  Date/Time:  11/07/2011 03:02 PM  Referred by:  Physician  Date Referred:  11/07/2011 Referred for  SNF Placement   Other Referral:   Interview type:  Patient Other interview type:   with dtr present.    PSYCHOSOCIAL DATA Living Status:  FAMILY Admitted from facility:   Level of care:   Primary support name:  Jill:[phone] Primary support relationship to patient:  CHILD, ADULT Degree of support available:   Ade    CURRENT CONCERNS Current Concerns  Post-Acute Placement   Other Concerns:    SOCIAL WORK ASSESSMENT / PLAN Clinical Social Worker recieved request from pt and pt's dtr to meet with pt at bedside. CSW reviewed chart and met with pt and pt dtr at bedside. CSW introduced self, explained role, and provided support.  CSW provided opporuntiy for pt to process thoughts/feelings related to hospitalization.  CSW validated feelings.  Pt inquired about CIR & SNF process.  CSW reviewed process.  CSW to continue to follow and assist as needed.   Assessment/plan status:  Information/Referral to [facility] Other assessment/ plan:   Information/referral to community resources:   CIR & SNF.    PATIENTS/FAMILYS RESPONSE TO PLAN OF CARE: Pt was pleasant and engaged in conversation.  Pt thanked CSW for intervention.

## 2011-11-08 MED ORDER — INFLUENZA VIRUS VACC SPLIT PF IM SUSP
0.5000 mL | INTRAMUSCULAR | Status: AC
  Administered 2011-11-09: 0.5 mL via INTRAMUSCULAR
  Filled 2011-11-08: qty 0.5

## 2011-11-08 NOTE — Progress Notes (Signed)
Physical Therapy Treatment Patient Details Name: Ricky Bradley MRN: [id number] DOB: 11/24/1933 Today's Date: 11/08/2011 Time: 4098-1191 PT Time Calculation (min): 24 min  PT Assessment / Plan / Recommendation Comments on Treatment Session  pt presents with C6-7 Lami.  pt showing improvement today, despite feeling sore.  pt would benefit from CIR at D/C.      Follow Up Recommendations  Inpatient Rehab    Barriers to Discharge        Equipment Recommendations  None recommended by PT    Recommendations for Other Services Rehab consult  Frequency Min 5X/week   Plan Discharge plan remains appropriate;Frequency remains appropriate    Precautions / Restrictions Precautions Precautions: Fall;Cervical Required Braces or Orthoses: Cervical Brace Cervical Brace: Hard collar (Per pt MD notes he can wear collar for comfort.  ) Restrictions Weight Bearing Restrictions: No   Pertinent Vitals/Pain Indicates sore in Bil shoulders and neck, but does not rate.      Mobility  Bed Mobility Bed Mobility: Not assessed Transfers Transfers: Sit to Stand;Stand to Sit Sit to Stand: 1: +2 Total assist;With upper extremity assist;From chair/3-in-1;With armrests Sit to Stand: Patient Percentage: 70% Stand to Sit: 4: Min assist;With upper extremity assist;To chair/3-in-1;With armrests Details for Transfer Assistance: cues to scoot to edge of chair and UE use.  Needs A for anterior wt shifting.   Ambulation/Gait Ambulation/Gait Assistance: 4: Min assist Ambulation Distance (Feet): 75 Feet Assistive device: Rolling walker Ambulation/Gait Assistance Details: cues for RW use, upright posture, encouragement.   Gait Pattern: Step-through pattern;Decreased stride length;Trunk flexed Stairs: No Wheelchair Mobility Wheelchair Mobility: No    Exercises     PT Diagnosis:    PT Problem List:   PT Treatment Interventions:     PT Goals Acute Rehab PT Goals Time For Goal Achievement: 11/21/11 PT Goal:  Sit to Stand - Progress: Progressing toward goal PT Goal: Stand to Sit - Progress: Progressing toward goal PT Goal: Ambulate - Progress: Progressing toward goal  Visit Information  Last PT Received On: 11/08/11 Assistance Needed: +2 (for lines and chair follow.  )    Subjective Data  Subjective: I'm good, just sore today.     Cognition  Overall Cognitive Status: Appears within functional limits for tasks assessed/performed Arousal/Alertness: Awake/alert Orientation Level: Appears intact for tasks assessed Behavior During Session: [facility] for tasks performed    Balance  Balance Balance Assessed: No  End of Session PT - End of Session Equipment Utilized During Treatment: Gait belt;Cervical collar Activity Tolerance: Patient tolerated treatment well Patient left: in chair;with call bell/phone within reach Nurse Communication: Mobility status   GP     Sunny Schlein, [place] [phone] 11/08/2011, 12:13 PM

## 2011-11-08 NOTE — Clinical Social Work Placement (Addendum)
    Clinical Social Work Department CLINICAL SOCIAL WORK PLACEMENT NOTE 11/08/2011  Patient:  Ricky Bradley, Ricky Bradley  Account Number:  [id number] Admit date:  11/06/2011  Clinical Social Worker:  Margaree Mackintosh  Date/time:  11/08/2011 11:57 AM  Clinical Social Work is seeking post-discharge placement for this patient at the following level of care:   SKILLED NURSING   (*CSW will update this form in Epic as items are completed)   11/08/2011  Patient/family provided with [facility] Department of Clinical Social Work's list of facilities offering this level of care within the geographic area requested by the patient (or if unable, by the patient's family).  11/08/2011  Patient/family informed of their freedom to choose among providers that offer the needed level of care, that participate in Medicare, Medicaid or managed care program needed by the patient, have an available bed and are willing to accept the patient.  11/08/2011  Patient/family informed of [facility]' ownership interest in [facility], as well as of the fact that they are under no obligation to receive care at this facility.  PASARR submitted to EDS on 11/08/2011 PASARR number received from EDS on 11/08/2011  FL2 transmitted to all facilities in geographic area requested by pt/family on  11/08/2011 FL2 transmitted to all facilities within larger geographic area on   Patient informed that his/her managed care company has contracts with or will negotiate with  certain facilities, including the following:     Patient/family informed of bed offers received:  11/10/2011 Patient chooses bed at [facility] Physician recommends and patient chooses bed at  [facility]  Patient to be transferred to [facility] on 11/10/2011 Patient to be transferred to facility by [facility]  The following physician request were entered in Epic:   Additional Comments:

## 2011-11-08 NOTE — Progress Notes (Signed)
Patient ID: Ricky Bradley, male   DOB: [birth year], 78 y.o.   MRN: [id number] Subjective:  The patient is alert and pleasant. He looks well.  Objective: Vital signs in last 24 hours: Temp:  [98.3 F (36.8 C)-99.5 F (37.5 C)] 98.3 F (36.8 C) (09/25 0833) Pulse Rate:  [87-111] 93  (09/25 0600) Resp:  [11-25] 11  (09/25 0000) BP: (101-148)/(54-95) 116/61 mmHg (09/25 0600) SpO2:  [92 %-97 %] 93 % (09/25 0700)  Intake/Output from previous day: 09/24 0701 - 09/25 0700 In: 1260 [P.O.:1240; I.V.:20] Out: 3750 [Urine:3750] Intake/Output this shift: Total I/O In: 50 [P.O.:50] Out: 75 [Urine:75]  Physical exam the patient is alert and oriented x3. His strength is grossly normal in all 4 extremities.  Lab Results: No results found for this basename: WBC:2,HGB:2,HCT:2,PLT:2 in the last 72 hours BMET No results found for this basename: NA:2,K:2,CL:2,CO2:2,GLUCOSE:2,BUN:2,CREATININE:2,CALCIUM:2 in the last 72 hours  Studies/Results: Dg Cervical Spine 1 View  11/06/2011  *RADIOLOGY REPORT*  Clinical Data: C6-7 posterior cervical fusion  DG CERVICAL SPINE - 1 VIEW  Comparison: CT cervical spine dated 09/26/2011  Findings: Cervical spine is visualized to the bottom of C5 on this single lateral view.  Surgical instruments posterior to C5-6.  Moderate multilevel degenerate changes with bridging osteophytes anteriorly.  IMPRESSION: Surgical instruments posterior to C5-6.   Original Report Authenticated By: Charline Bills, M.D.     Assessment/Plan: Postop day #2: The patient is doing well neurologically. We will continue to mobilize him with PT and OT. He can be transferred to the floor.  Urinary retention: This is a chronic issue for the patient. He is seen Dr. Lynnae Sandhoff in the past. We will continue the Flomax in attempt to discontinue the Foley tomorrow.  LOS: 2 days     Ta Fair D 11/08/2011, 9:16 AM

## 2011-11-09 MED ORDER — POLYETHYLENE GLYCOL 3350 17 G PO PACK
17.0000 g | PACK | Freq: Every day | ORAL | Status: DC | PRN
  Administered 2011-11-09: 17 g via ORAL
  Filled 2011-11-09 (×2): qty 1

## 2011-11-09 MED ORDER — FLEET ENEMA 7-19 GM/118ML RE ENEM
1.0000 | ENEMA | Freq: Every day | RECTAL | Status: DC | PRN
  Administered 2011-11-09: 1 via RECTAL
  Filled 2011-11-09: qty 1

## 2011-11-09 NOTE — Progress Notes (Signed)
Physical Therapy Treatment Patient Details Name: Ricky Bradley MRN: [id number] DOB: [birth year] Today's Date: 11/09/2011 Time: 4098-1191 PT Time Calculation (min): 26 min  PT Assessment / Plan / Recommendation Comments on Treatment Session  pt rpesents with C6-7 Lami.  pt continuing to make progress and notes he is still feeling sore, but is trying to minimize the amount of pain medication he is taking.  pt would still benefit from CIR at D/C.      Follow Up Recommendations  Inpatient Rehab    Barriers to Discharge        Equipment Recommendations  None recommended by PT    Recommendations for Other Services Rehab consult  Frequency Min 5X/week   Plan Discharge plan remains appropriate;Frequency remains appropriate    Precautions / Restrictions Precautions Precautions: Fall;Cervical Required Braces or Orthoses: Cervical Brace Cervical Brace: Hard collar (Per pt MD notes he can wear collar for comfort.  ) Restrictions Weight Bearing Restrictions: No   Pertinent Vitals/Pain Indicates Bil shoulders and neck are sore, but improving.      Mobility  Bed Mobility Bed Mobility: Rolling Left;Left Sidelying to Sit;Sitting - Scoot to [place] of Bed Rolling Left: 4: Min assist;With rail Left Sidelying to Sit: 3: Mod assist Sitting - Scoot to Edge of Bed: 3: Mod assist Details for Bed Mobility Assistance: cues for use of UEs, sequencing Transfers Transfers: Sit to Stand;Stand to Sit Sit to Stand: 4: Min assist;With upper extremity assist;From bed Stand to Sit: 4: Min assist;With upper extremity assist;To bed Details for Transfer Assistance: cues for anterior wt shift with sit to stand, cues for UE use and to control descent to chair.   Ambulation/Gait Ambulation/Gait Assistance: 4: Min assist Ambulation Distance (Feet): 100 Feet Assistive device: Rolling walker Ambulation/Gait Assistance Details: cues to stay closer to RW, upright posture, encouragement.   Gait Pattern: Step-through  pattern;Decreased stride length;Trunk flexed Stairs: No Wheelchair Mobility Wheelchair Mobility: No    Exercises     PT Diagnosis:    PT Problem List:   PT Treatment Interventions:     PT Goals Acute Rehab PT Goals Time For Goal Achievement: 11/21/11 PT Goal: Supine/Side to Sit - Progress: Progressing toward goal PT Goal: Sit to Stand - Progress: Progressing toward goal PT Goal: Stand to Sit - Progress: Progressing toward goal PT Goal: Ambulate - Progress: Progressing toward goal  Visit Information  Last PT Received On: 11/09/11 Assistance Needed: +1    Subjective Data  Subjective: I'm trying not to take too much pain meds if I don't have to.     Cognition  Overall Cognitive Status: Appears within functional limits for tasks assessed/performed Arousal/Alertness: Awake/alert Orientation Level: Appears intact for tasks assessed Behavior During Session: [facility] for tasks performed    Balance  Balance Balance Assessed: Yes Static Standing Balance Static Standing - Balance Support: Bilateral upper extremity supported;During functional activity Static Standing - Level of Assistance: 4: Min assist Static Standing - Comment/# of Minutes: pt MinA to maintain balance with Bil UEs supported.  pt with mild sway and unsteadiness.    End of Session PT - End of Session Equipment Utilized During Treatment: Gait belt;Cervical collar Activity Tolerance: Patient tolerated treatment well Patient left: in chair;with call bell/phone within reach Nurse Communication: Mobility status   GP     Sunny Schlein, [place] [phone] 11/09/2011, 10:00 AM

## 2011-11-09 NOTE — Progress Notes (Signed)
Pt  Passed moderate amount of gas and small amount of loose stool after enema.  Washed up, and repositioned, states he is feeling better.  Refused dinner tray tonight.  Abdomen softly distended.

## 2011-11-09 NOTE — Progress Notes (Signed)
Patient ID: Ricky Bradley, male   DOB: 05-Jul-1933, 78 y.o.   MRN: [id number] Subjective:  The patient is alert and pleasant. His neck is appropriately sore. He looks well.  Objective: Vital signs in last 24 hours: Temp:  [98 F (36.7 C)-99 F (37.2 C)] 98.1 F (36.7 C) (09/26 1400) Pulse Rate:  [83-92] 85  (09/26 1400) Resp:  [13-19] 18  (09/26 1400) BP: (111-123)/(51-92) 115/70 mmHg (09/26 1400) SpO2:  [94 %-99 %] 99 % (09/26 1400)  Intake/Output from previous day: 09/25 0701 - 09/26 0700 In: 300 [P.O.:300] Out: 2070 [Urine:2070] Intake/Output this shift: Total I/O In: -  Out: 725 [Urine:725]  Physical exam the patient is alert and oriented. His strength is grossly normal in all 4 extremities. His wound is healing well without signs of infection.  Lab Results: No results found for this basename: WBC:2,HGB:2,HCT:2,PLT:2 in the last 72 hours BMET No results found for this basename: NA:2,K:2,CL:2,CO2:2,GLUCOSE:2,BUN:2,CREATININE:2,CALCIUM:2 in the last 72 hours  Studies/Results: No results found.  Assessment/Plan: Postop day #3: The patient is doing well and ready for rehabilitation versus skillednursing facility placement.  LOS: 3 days     Resha Filippone D 11/09/2011, 5:43 PM

## 2011-11-10 MED ORDER — HYDROMORPHONE HCL 4 MG PO TABS: 4.0000 mg | ORAL_TABLET | ORAL | Status: DC | PRN

## 2011-11-10 MED ORDER — DIAZEPAM 2 MG PO TABS: 2.0000 mg | ORAL_TABLET | Freq: Four times a day (QID) | ORAL | Status: DC | PRN

## 2011-11-10 NOTE — Progress Notes (Signed)
Physical Therapy Treatment Patient Details Name: Ricky Bradley MRN: [id number] DOB: [birth year] Today's Date: 11/10/2011 Time: 6578-4696 PT Time Calculation (min): 34 min  PT Assessment / Plan / Recommendation Comments on Treatment Session  Mr. Weinheimer is 78 y/o male s/p C6-7 laminectomy. Continues to make progress however still generally weak with limited activity tolerance and instability during gait. Continue to rec. CIR however noted limited beds available. CSW in to speak with patient following our session.     Follow Up Recommendations    Inpatient Rehab   Barriers to Discharge        Equipment Recommendations  None recommended by PT    Recommendations for Other Services    Frequency     Plan Discharge plan remains appropriate;Frequency remains appropriate    Precautions / Restrictions Precautions Precautions: Fall;Cervical Cervical Brace: Hard collar (per notes hard collar for comfort)       Mobility  Bed Mobility Bed Mobility: Rolling Right;Right Sidelying to Sit;Rolling Left Rolling Right: 4: Min assist Rolling Left: 4: Min assist Right Sidelying to Sit: 4: Min assist Details for Bed Mobility Assistance: min tactile cues for efficiency of movement and sequencing for technique Transfers Transfers: Stand to Sit;Sit to Stand (x5 for strengthening) Sit to Stand: 4: Min assist;With upper extremity assist;From bed;From chair/3-in-1;With armrests Stand to Sit: With upper extremity assist;To chair/3-in-1;With armrests;4: Min guard Details for Transfer Assistance: min facilitation at sacrum for anterior weight shift and power up for tall standing, cues for hand placement and controlled descent to chair Ambulation/Gait Ambulation/Gait Assistance: 4: Min assist Ambulation Distance (Feet): 150 Feet Assistive device: Rolling walker Ambulation/Gait Assistance Details: cues for tall posture and to step into RW, cues for increased step length Gait Pattern: Decreased step length -  right;Decreased step length - left;Decreased dorsiflexion - left;Decreased dorsiflexion - right;Right flexed knee in stance;Left flexed knee in stance;Trunk flexed (forward head) General Gait Details: difficulty extending knees for full swing phase shortening pt's stride and step length bilaterally    Exercises General Exercises - Lower Extremity Long Arc Quad: AROM;Both;20 reps;Seated Toe Raises: AROM;Both;20 reps;Seated Heel Raises: AROM;Both;20 reps;Seated      PT Goals Acute Rehab PT Goals PT Goal: Supine/Side to Sit - Progress: Progressing toward goal PT Goal: Sit to Stand - Progress: Progressing toward goal PT Goal: Stand to Sit - Progress: Progressing toward goal PT Goal: Ambulate - Progress: Progressing toward goal  Visit Information  Last PT Received On: 11/10/11 Assistance Needed: +1    Subjective Data  Subjective: I really want to go to rehab here at the hospital.    Cognition  Overall Cognitive Status: Appears within functional limits for tasks assessed/performed Arousal/Alertness: Awake/alert Orientation Level: Appears intact for tasks assessed Behavior During Session: [facility] for tasks performed    Balance  Static Standing Balance Static Standing - Balance Support: Bilateral upper extremity supported Static Standing - Level of Assistance: 5: Stand by assistance Static Standing - Comment/# of Minutes: min facilitation for tall posture, poor tolerance for standing needing to sit after approx a minute secondary to fatigue  End of Session PT - End of Session Equipment Utilized During Treatment: Gait belt Activity Tolerance: Patient tolerated treatment well Patient left: in chair;with call bell/phone within reach Nurse Communication: Mobility status   GP     [facility] HELEN 11/10/2011, 12:07 PM

## 2011-11-10 NOTE — Progress Notes (Signed)
Pt. Has voided twice since foley removed, ambulated in hallway and tolerated well, abdomen soft,  Passing flatus. No other complaints voiced , unhappy because can't go to rehab here.

## 2011-11-10 NOTE — Clinical Social Work Note (Signed)
Clinical Social Work  Pt is ready for discharge to [facility]. Facility has received information and is ready to admit pt. Pt and daughter are agreeable with discharge plan. [facility] will provide transportation. CSW is signing off as no further needs identified.   Dede Query, MSW, [facility] [phone]

## 2011-11-10 NOTE — Progress Notes (Signed)
[facility] here to transport patient to [facility].

## 2011-11-10 NOTE — Discharge Summary (Signed)
Physician Discharge Summary  Patient ID: Ricky Bradley MRN: [id number] DOB/AGE: 1933/08/28 78 y.o.  Admit date: 11/06/2011 Discharge date: 11/10/2011  Admission Diagnoses: Cervical spinal stenosis, cervical myelopathy  Discharge Diagnoses: The same Active Problems:  * No active hospital problems. *    Discharged Condition: good  Hospital Course: I admitted the patient to [facility] [place] on 11/06/2011. On that day I performed a C6 and C7 decompressive laminectomy instrumentation and fusion. The surgery went well.  Patient's postoperative course was remarkable for urinary retention. This has been a chronic problem for the patient and he has seen Dr. Lynnae Sandhoff for this. We discontinue his Foley catheter 11/10/2011.  Consults: Rehabilitation Significant Diagnostic Studies: None Treatments: C6-7 laminectomy, agitation, and fusion. Discharge Exam: Blood pressure 115/58, pulse 84, temperature 98.1 F (36.7 C), temperature source Oral, resp. rate 18, height 6' (1.829 m), weight 78.8 kg (173 lb 11.6 oz), SpO2 98.00%. The patient is alert and oriented. His wound is healing well without signs of infection. His strength is normal.  Disposition: Rehabilitation or skilled nursing facility.  Discharge Orders    Future Orders Please Complete By Expires   Diet - low sodium heart healthy      Increase activity slowly      Discharge instructions      Comments:   Call [phone] for a followup appointment.   No dressing needed      Call MD for:  temperature >100.4      Call MD for:  persistant nausea and vomiting      Call MD for:  severe uncontrolled pain      Call MD for:  redness, tenderness, or signs of infection (pain, swelling, redness, odor or green/yellow discharge around incision site)      Call MD for:  difficulty breathing, headache or visual disturbances      Call MD for:  hives      Call MD for:  persistant dizziness or light-headedness      Call MD for:  extreme fatigue            Medication List     As of 11/10/2011  7:42 AM    STOP taking these medications         sulfamethoxazole-trimethoprim 800-160 MG per tablet   Commonly known as: BACTRIM DS      TAKE these medications         aspirin 81 MG EC tablet   Take 81 mg by mouth daily.      cetirizine 10 MG tablet   Commonly known as: ZYRTEC   Take 5 mg by mouth 2 (two) times daily.      citalopram 40 MG tablet   Commonly known as: CELEXA   Take 40 mg by mouth daily.      diazepam 2 MG tablet   Commonly known as: VALIUM   Take 1 tablet (2 mg total) by mouth every 6 (six) hours as needed.      HYDROmorphone 4 MG tablet   Commonly known as: DILAUDID   Take 1 tablet (4 mg total) by mouth every 4 (four) hours as needed.      multivitamin with minerals Tabs   Take 1 tablet by mouth daily.      pantoprazole 40 MG tablet   Commonly known as: PROTONIX   Take 40 mg by mouth 2 (two) times daily.      PROBIOTIC DAILY PO   Take 1 tablet by mouth daily.  senna 8.6 MG Tabs   Commonly known as: SENOKOT   Take 1 tablet by mouth 2 (two) times daily as needed. For constipation.      simethicone 80 MG chewable tablet   Commonly known as: MYLICON   Chew 80 mg by mouth 3 (three) times daily.      STOOL SOFTENER PO   Take 1 tablet by mouth at bedtime.      Tamsulosin HCl 0.4 MG Caps   Commonly known as: FLOMAX   Take 0.4 mg by mouth daily.         SignedCristi Loron 11/10/2011, 7:42 AM

## 2012-07-22 IMAGING — CR DG ABDOMEN ACUTE W/ 1V CHEST
3 series · 3 of 3 positions shown · non-contrast
Comparison: None.

CLINICAL DATA: Right upper quadrant abdominal pain.
Cholecystectomy 11/11/2010.

ACUTE ABDOMEN SERIES (ABDOMEN 2 VIEW & CHEST 1 VIEW)

[w chest pa]
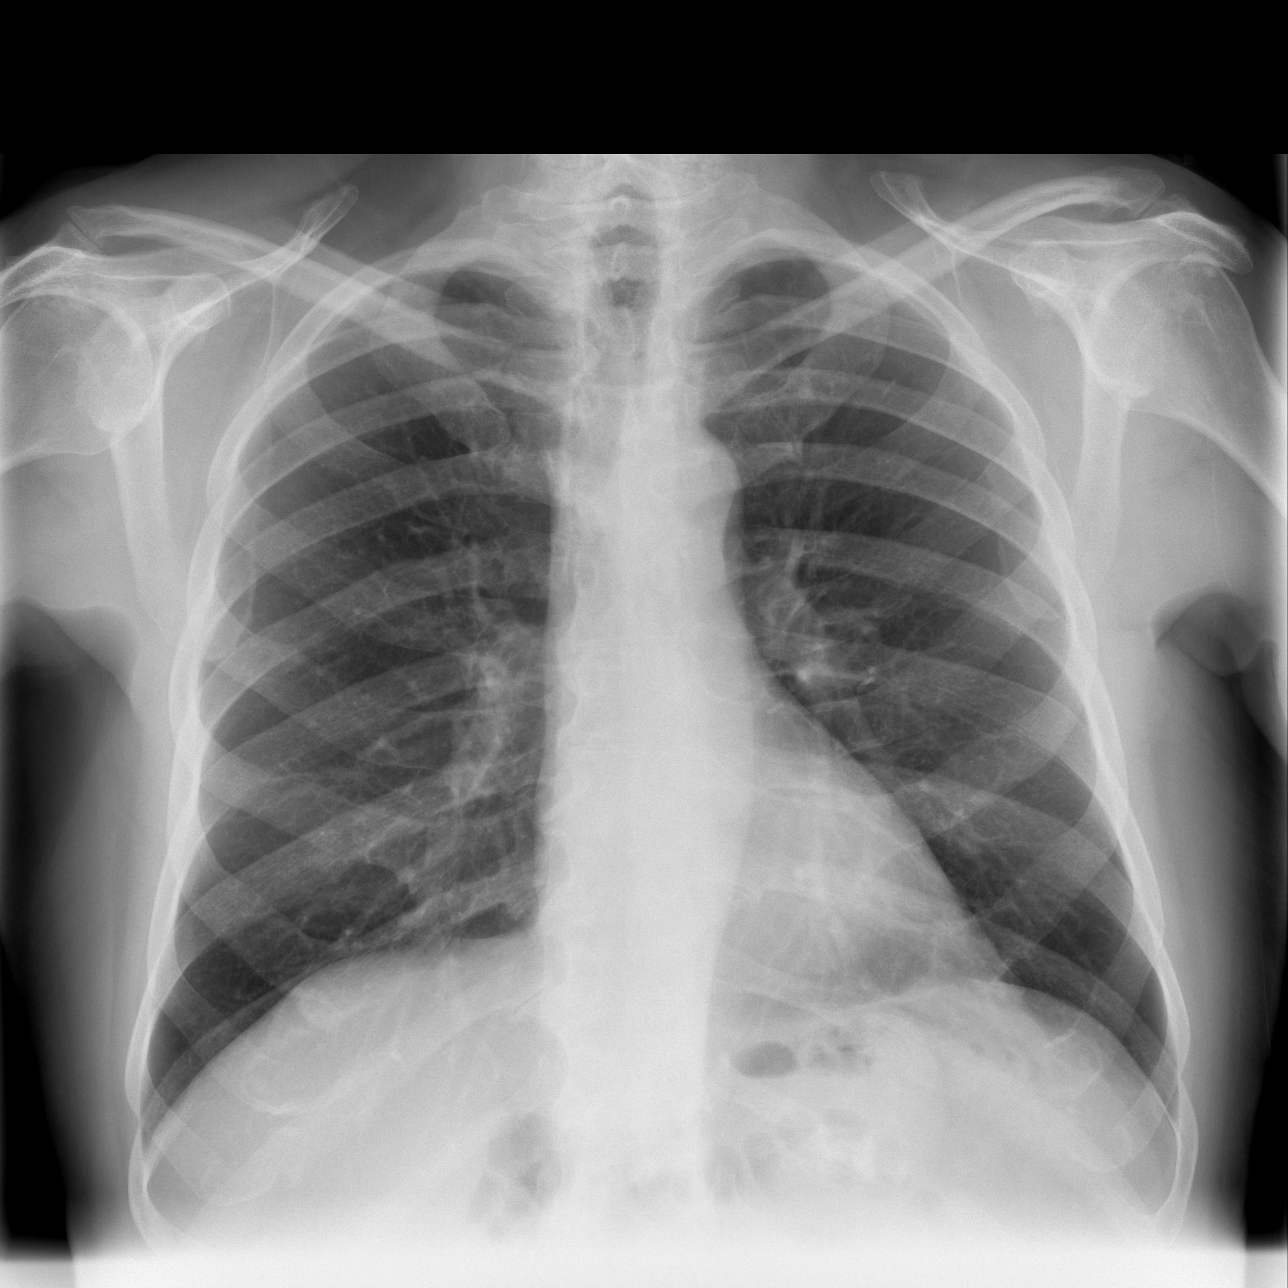

[w abdomen upright]
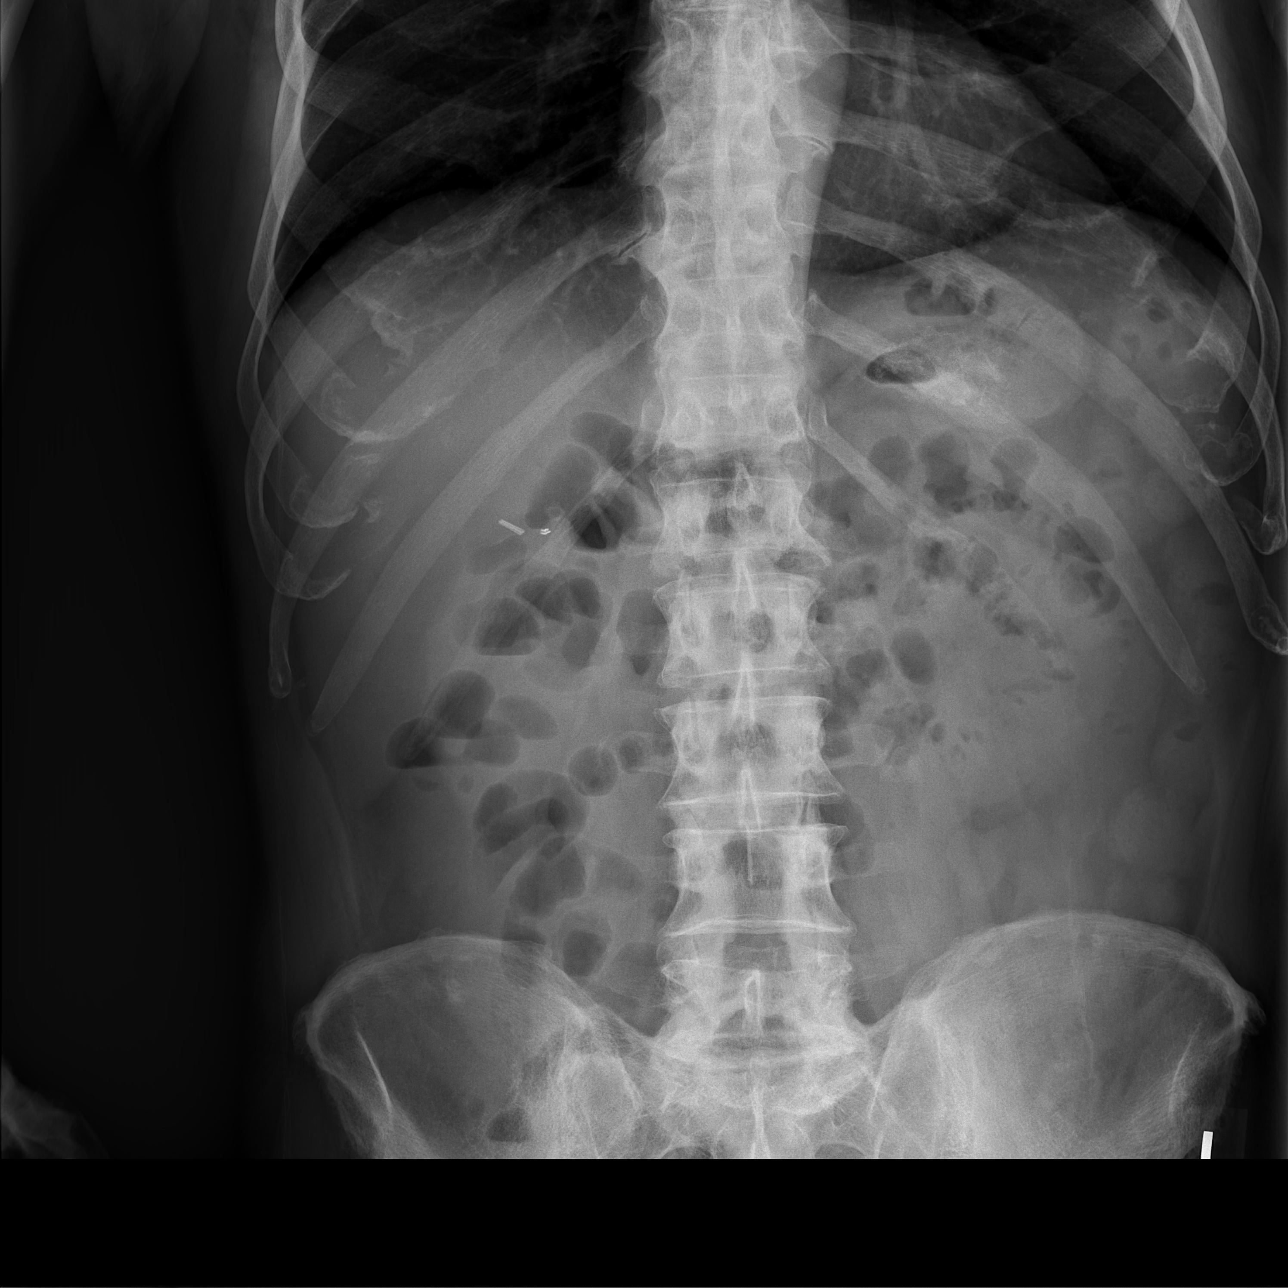

[t abdomen supine]
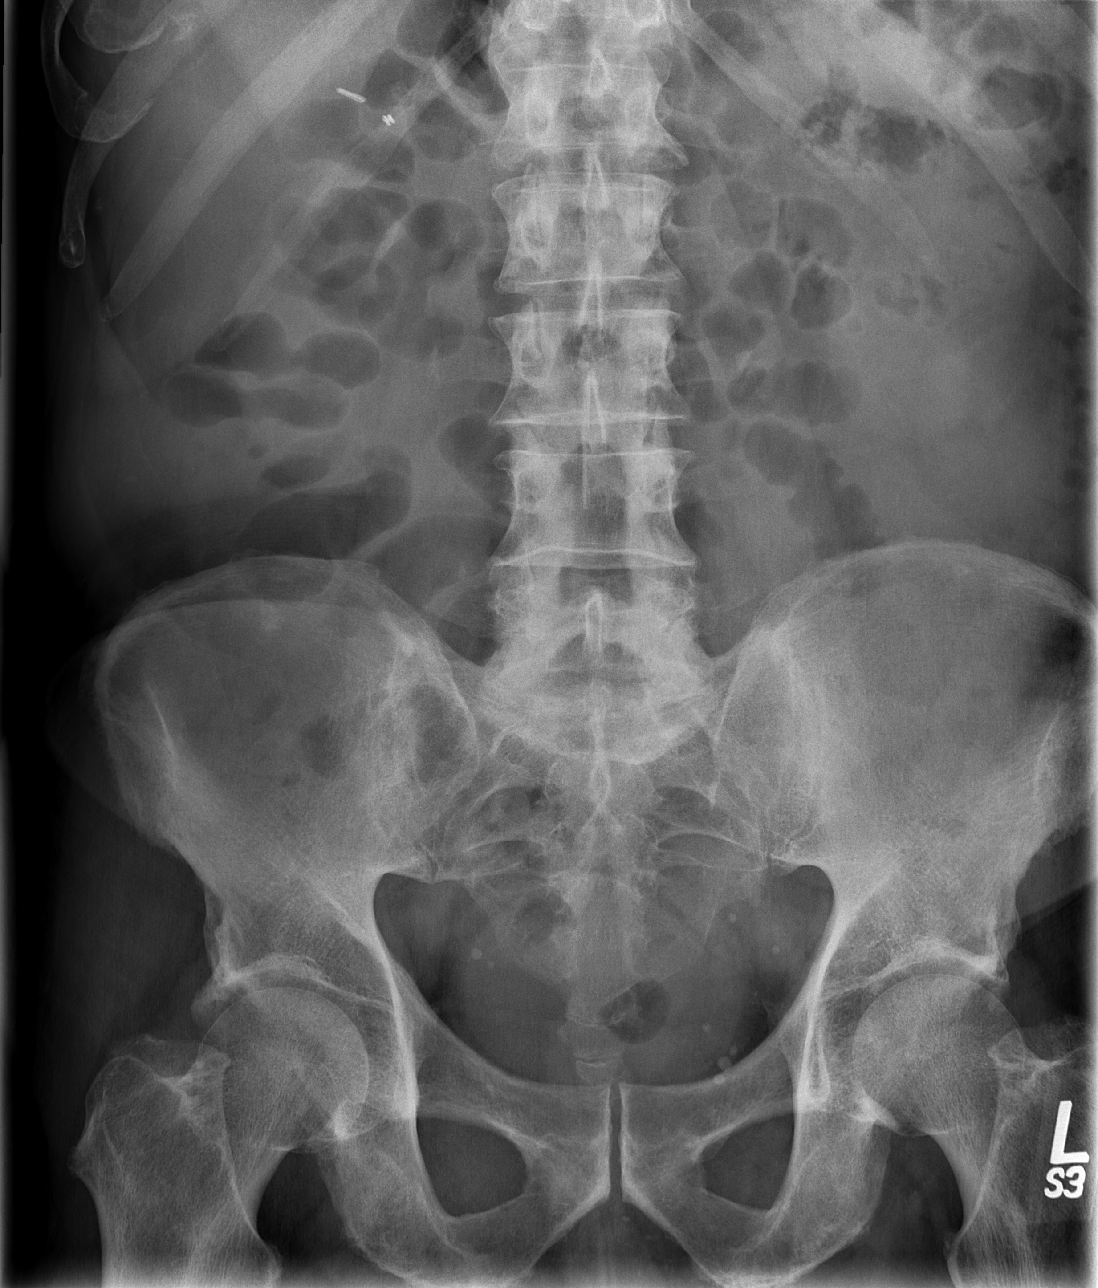

[3 of 3 positions shown; findings below may reference images not displayed]

FINDINGS: Lungs clear.  Cardiopericardial silhouette within normal
limits.  Lateral downsloping of the acromion bilaterally
incidentally noted, which can be associated with rotator cuff
impingement.

No free air is present underneath the hemidiaphragms.
Cholecystectomy clips are present in the right upper quadrant.
High density ingested contents are present within the stomach.
There are no dilated loops of large or small bowel.  Bowel gas
pattern is nonobstructive.  There are some scattered air fluid
levels within the ascending colon which is abnormal but
nonspecific, most frequently associated with enteric infection
including C difficile colitis.
IMPRESSION: No acute abnormality.  Cholecystectomy. Nonobstructive bowel gas
pattern.  Nonspecific air-fluid levels as described above.

## 2012-07-22 IMAGING — CT CT ABD-PELV W/ CM
2 of 5 series · 17 of 46 positions shown, 19 images · IV contrast (APPLIED)
Comparison: 12/04/2010

CLINICAL DATA: Abdominal pain

CT ABDOMEN AND PELVIS WITH CONTRAST
TECHNIQUE: Multidetector CT imaging of the abdomen and pelvis was
performed following the standard protocol during bolus
administration of intravenous contrast.
Contrast: 100mL OMNIPAQUE IOHEXOL 300 MG/ML IV SOLN

[Series 2: abd/pelv with 5.0 b31f st · axial · 0.80mm/px · z∈[-464,-54]mm · 14 of 94 slices shown, 16 images]
[im 6/94  soft-tissue]
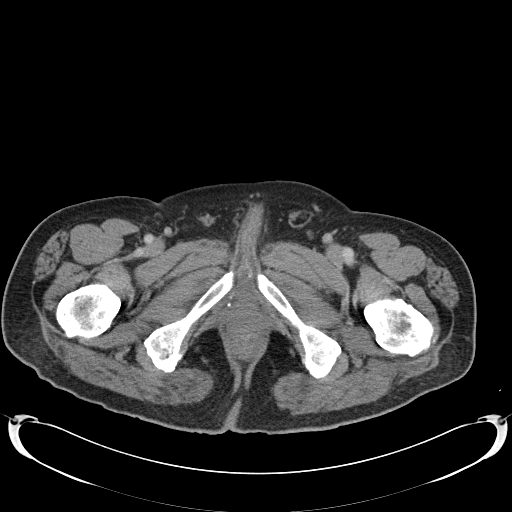
[im 6/94  bone]
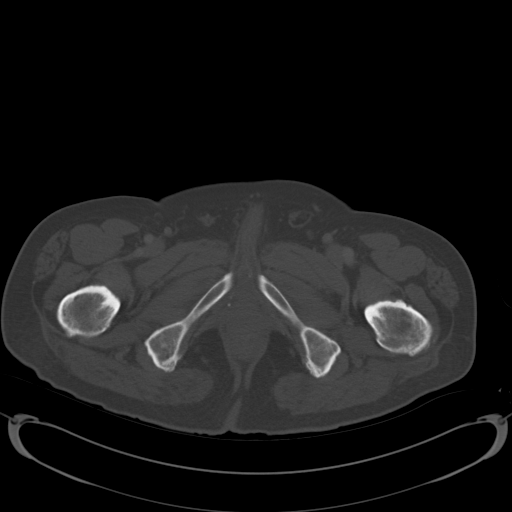
[im 11/94  soft-tissue]
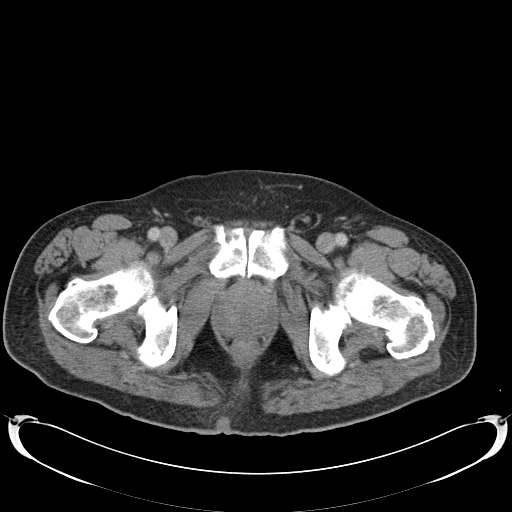
[im 21/94  soft-tissue]
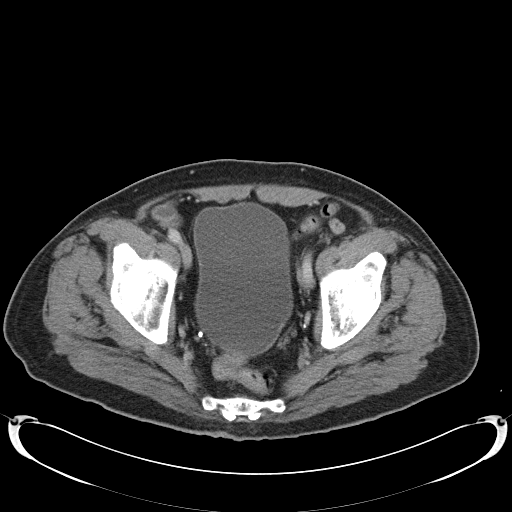
[im 26/94  soft-tissue]
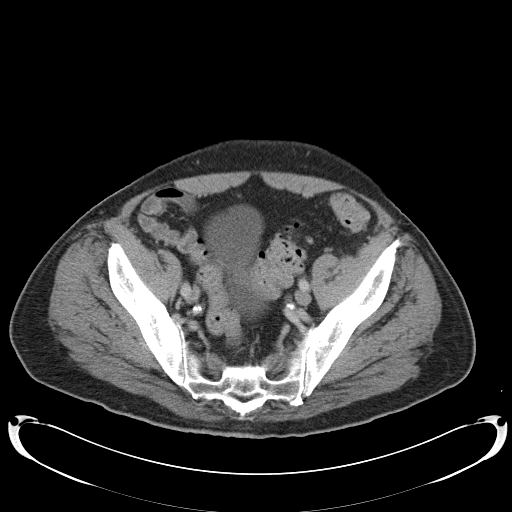
[im 32/94  soft-tissue]
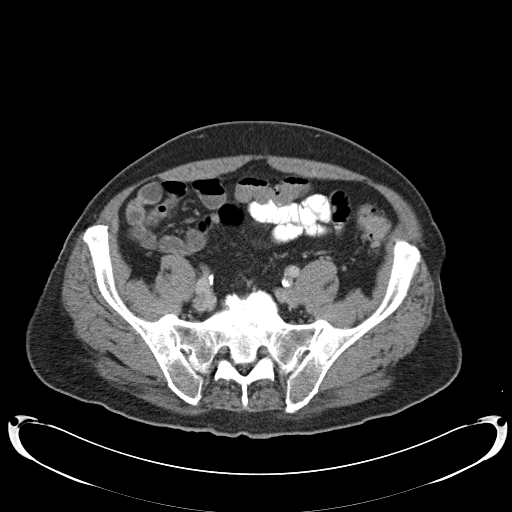
[im 37/94  soft-tissue]
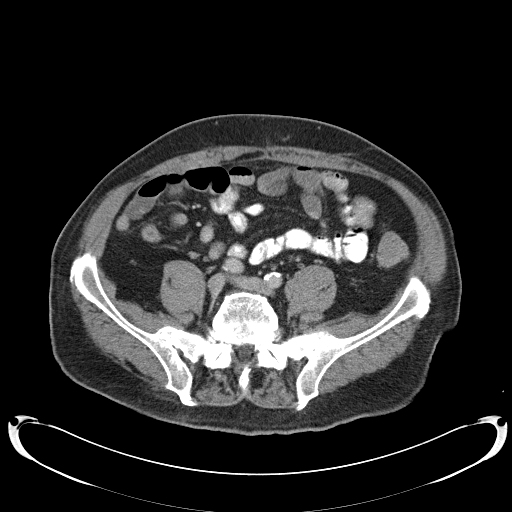
[im 42/94  soft-tissue]
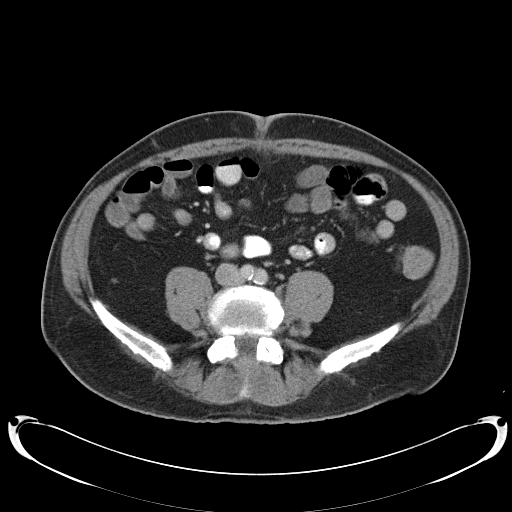
[im 52/94  soft-tissue]
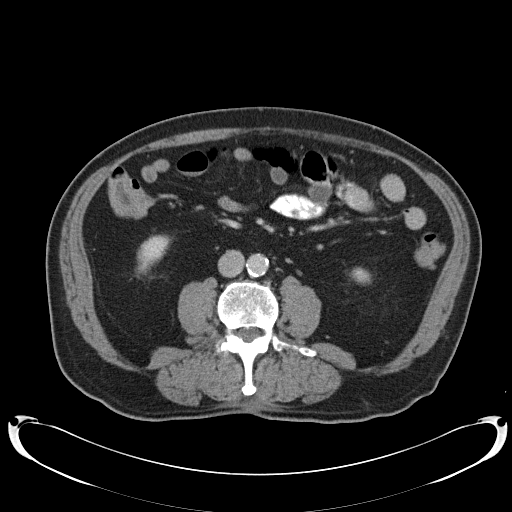
[im 57/94  soft-tissue]
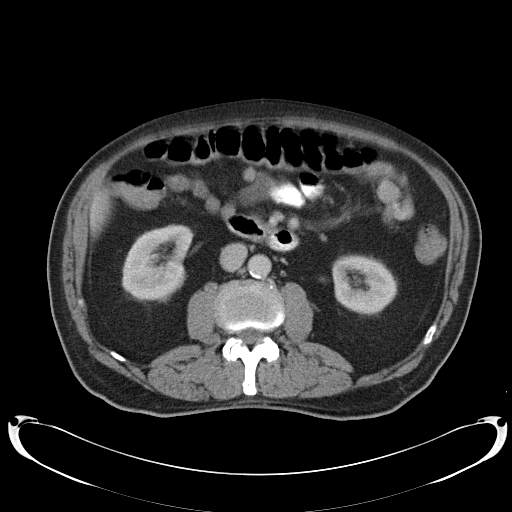
[im 57/94  bone]
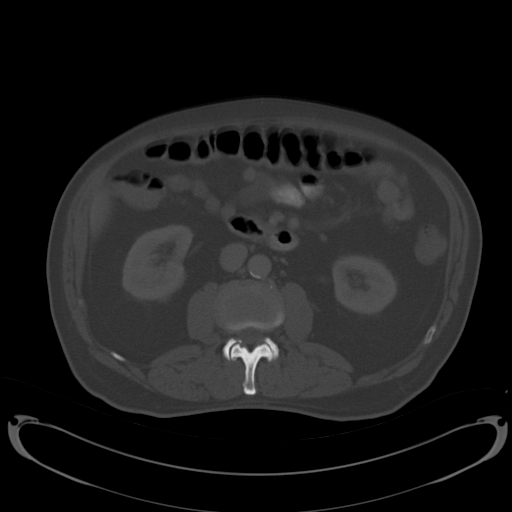
[im 63/94  soft-tissue]
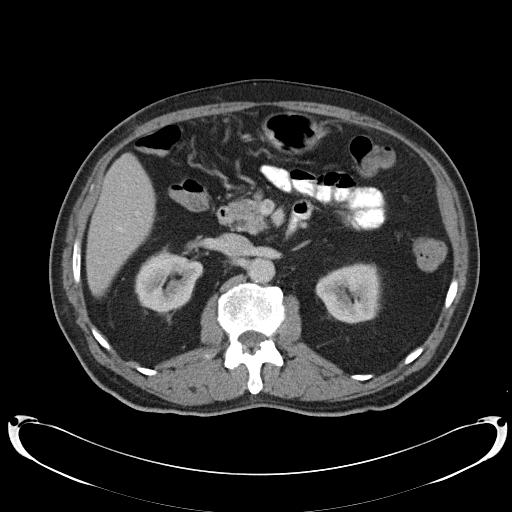
[im 68/94  soft-tissue]
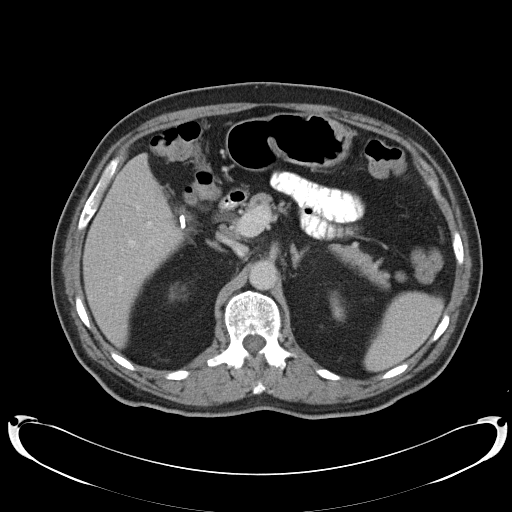
[im 73/94  soft-tissue]
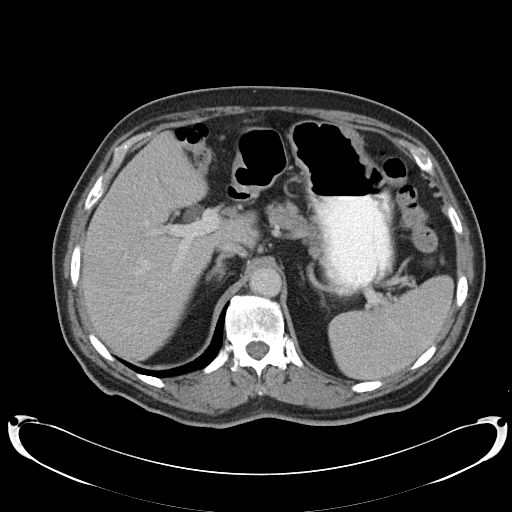
[im 83/94  soft-tissue]
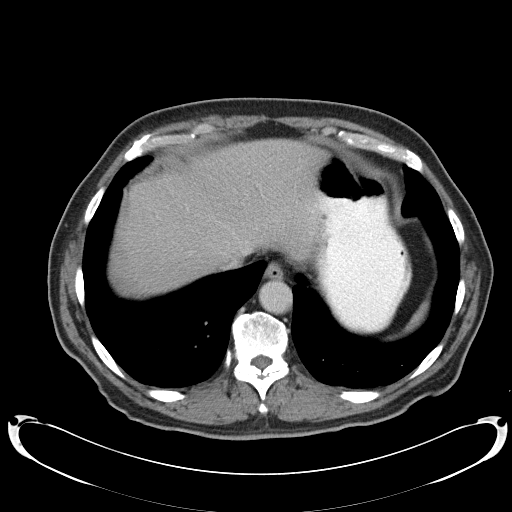
[im 88/94  soft-tissue]
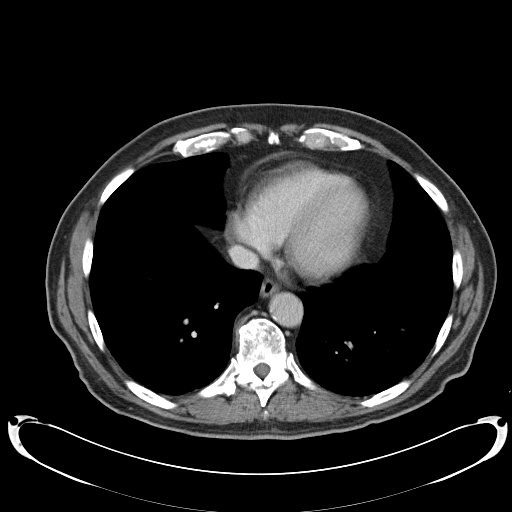

[Series 5: abd/pelv with 2.0 spo st · coronal · 0.91mm/px · 3 of 116 slices shown]
[im 39/116  soft-tissue]
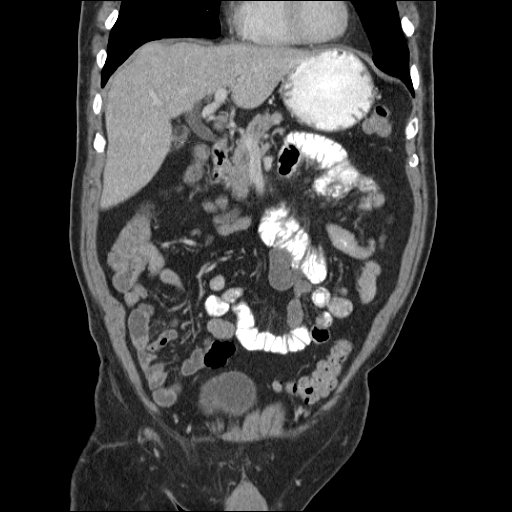
[im 52/116  soft-tissue]
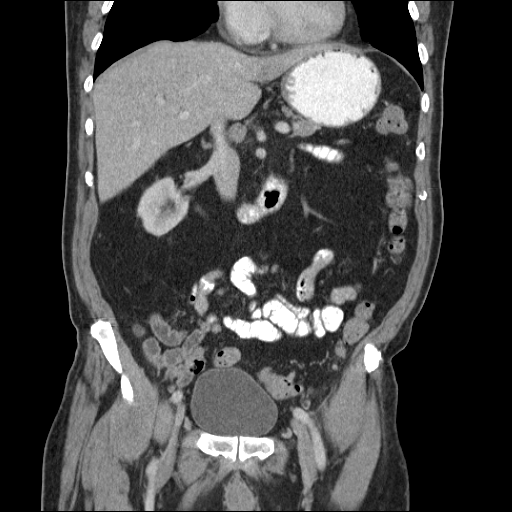
[im 64/116  soft-tissue]
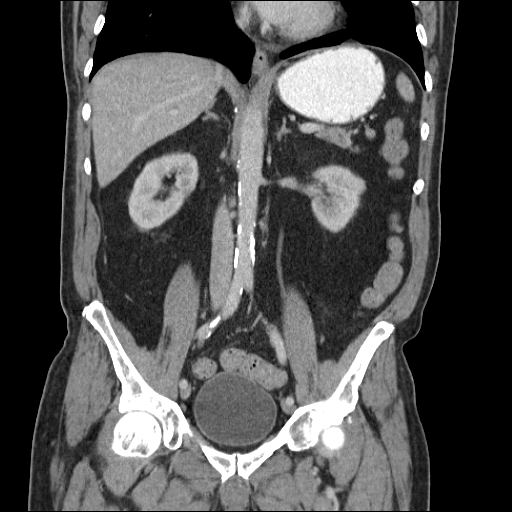

[17 of 46 positions shown; findings below may reference images not displayed]

FINDINGS: Visualized lung bases clear.  Vascular clips in
gallbladder fossa.  Stable sub centimeter   cyst in the lateral
left hepatic segment near the dome.  Unremarkable spleen and
accessory splenule, adrenal glands, pancreas, kidneys.  Stomach is
physiologically distended.  Small bowel decompressed.  Normal
appendix.  The colon is nondilated, with scattered sigmoid
diverticula; no adjacent inflammatory/edematous change. Rectum is
decompressed.  Urinary bladder physiologically distended.  Moderate
prostatic enlargement.  Bilateral pelvic phleboliths.  No pelvic,
retroperitoneal, or mesenteric adenopathy.  Portal vein patent.
Atheromatous nondilated abdominal aorta.  No hydronephrosis.
Spondylitic changes in the lumbar spine with postop changes L4-5,
advanced disc narrowing L5-S1.
IMPRESSION: 1.  No acute abdominal process.
2.  Stable degenerative and postoperative changes as above.

## 2012-09-04 IMAGING — CR DG CERVICAL SPINE 1V
1 series · 1 of 1 positions shown · non-contrast
Comparison: 01/16/2011

CLINICAL DATA: Hematoma.

DG SPINE PORTABLE - 1 VIEW

[view not recorded]
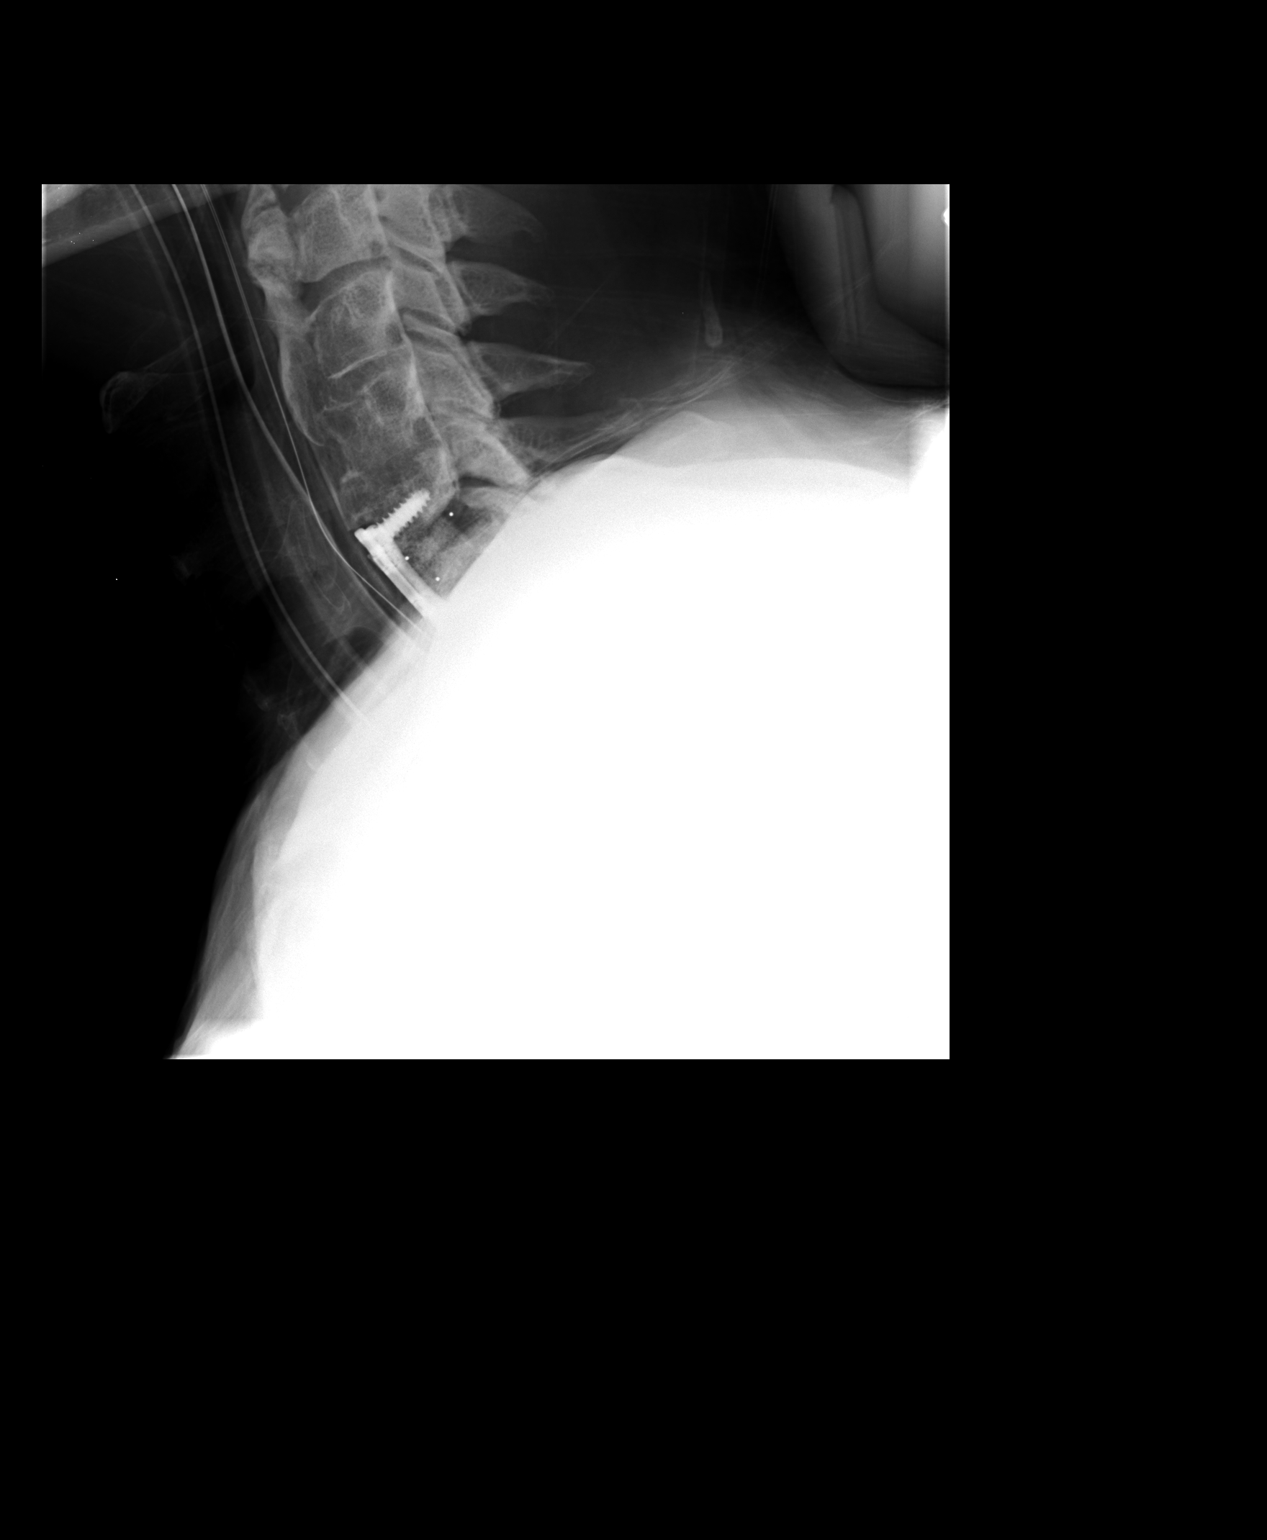

[1 of 1 positions shown; findings below may reference images not displayed]

FINDINGS: Cross-table lateral view of the cervical spine obtained.
C7 and T1 are not visualized due to soft tissue attenuation.
Postoperative changes with anterior plate and screw fixation with
intervertebral fusion at C6-7, incompletely visualized on this
film.  This has been placed since the previous intraoperative
study.  As visualized, the cervical vertebra appear to be in normal
alignment.  Prominent degenerative changes throughout cervical
spine with prominent bridging anterior osteophytes and probable
ankylosis or  fusion at C4-5 and C5-6 levels. NG and endotracheal
tubes are in place.
IMPRESSION: Postoperative changes at C6-7, incompletely visualized.  Diffuse
degenerative changes.  Normal alignment of visualized vertebrae.

## 2012-09-13 IMAGING — CR DG ABDOMEN 1V
1 series · 1 of 1 positions shown · non-contrast
Comparison: Radiographs dated 12/10/2010

CLINICAL DATA: Abdominal distention.

ABDOMEN - 1 VIEW

[t abdomen supine]
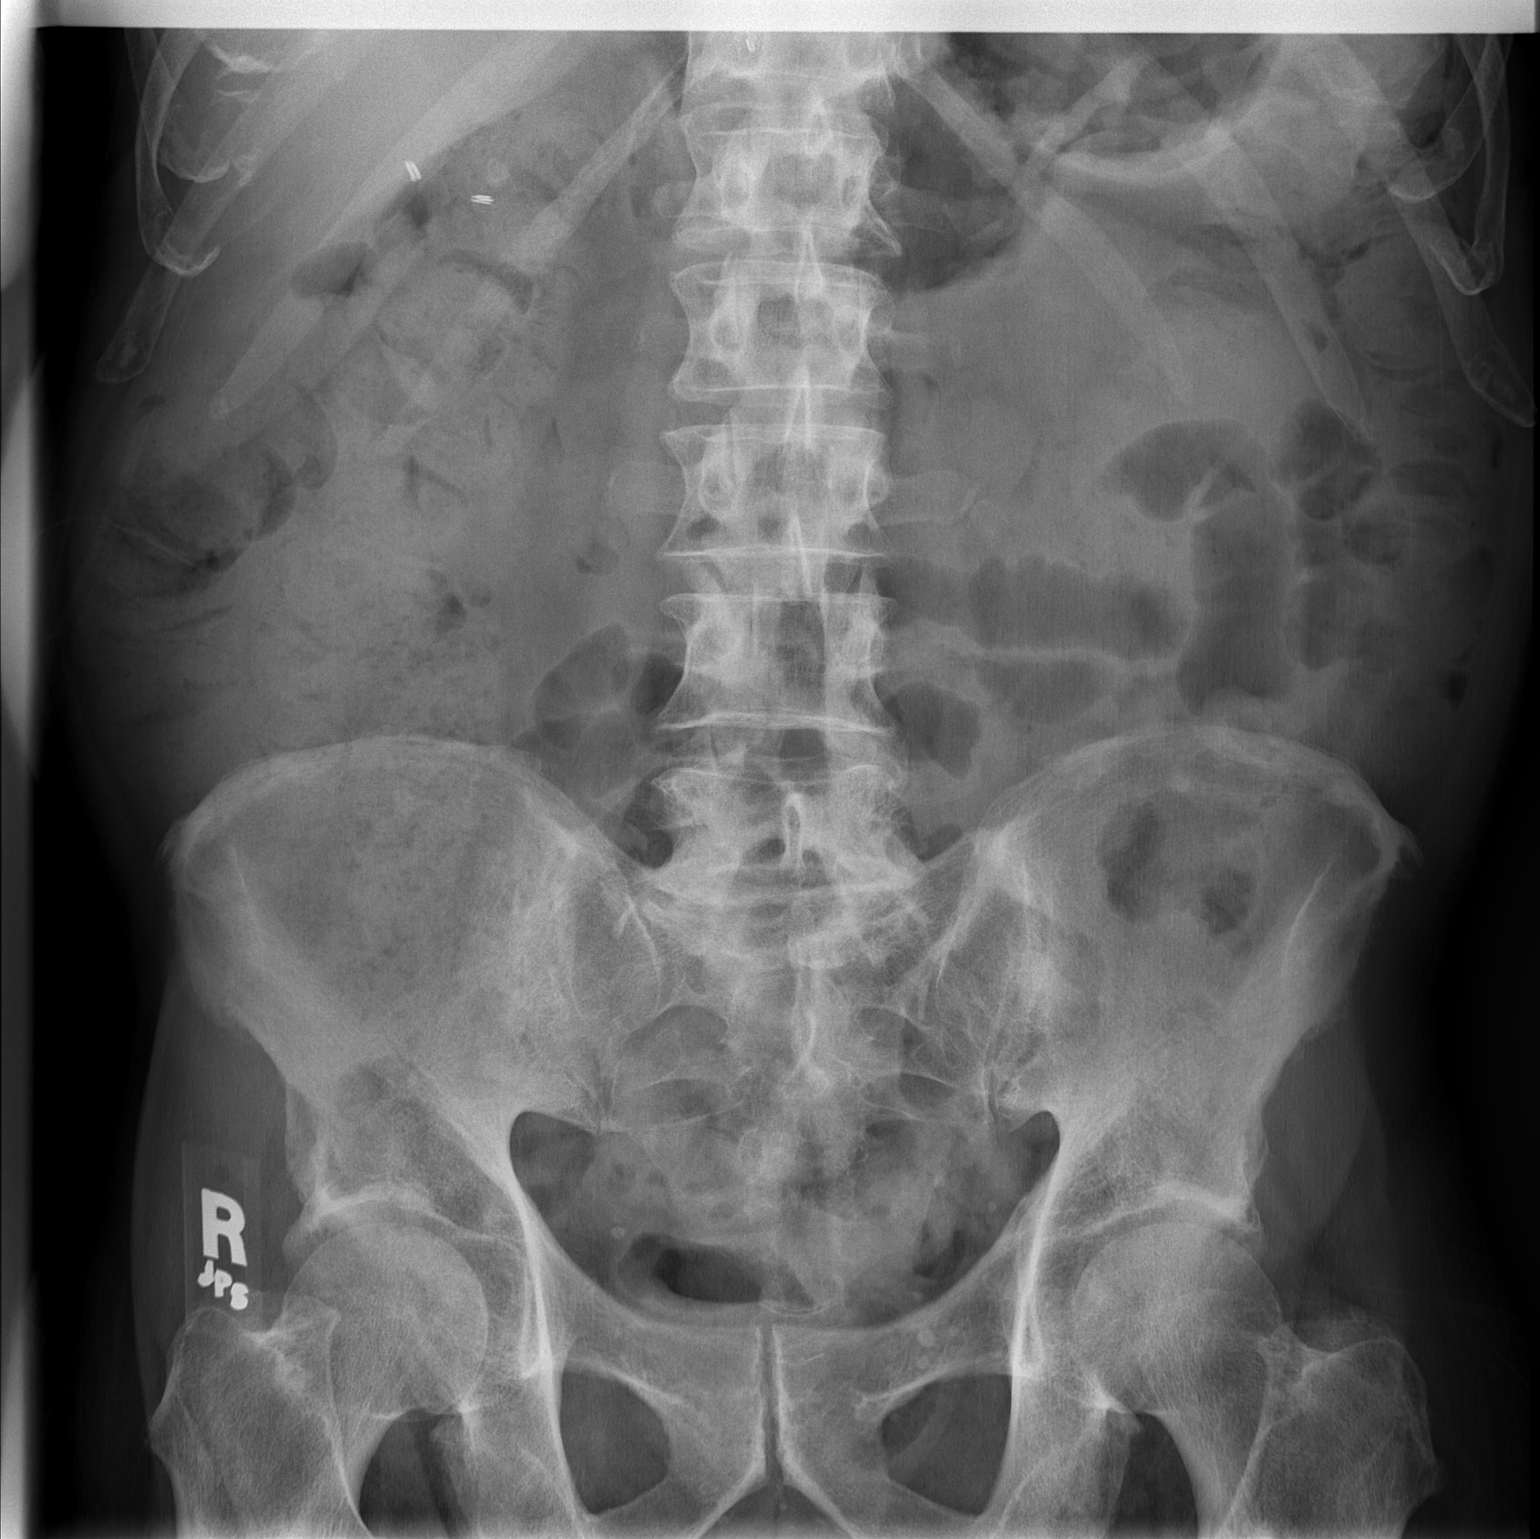

[1 of 1 positions shown; findings below may reference images not displayed]

FINDINGS: There are no dilated loops of large or small bowel. There
is moderate stool in the right side of the colon.  No fecal
impaction.  No acute osseous abnormality.  Evidence of previous
laminectomies at L4.  Evidence of previous gallbladder surgery.
IMPRESSION: Benign-appearing abdomen.  No dilated bowel.

## 2015-03-29 ENCOUNTER — Emergency Department (HOSPITAL_BASED_OUTPATIENT_CLINIC_OR_DEPARTMENT_OTHER): Payer: Medicare Other

## 2015-03-29 ENCOUNTER — Emergency Department (HOSPITAL_BASED_OUTPATIENT_CLINIC_OR_DEPARTMENT_OTHER)
Admission: EM | Admit: 2015-03-29 | Discharge: 2015-03-29 | Disposition: A | Discharge status: Home / Self Care | Payer: Medicare Other | Attending: Emergency Medicine | Admitting: Emergency Medicine

## 2015-03-29 ENCOUNTER — Encounter (HOSPITAL_BASED_OUTPATIENT_CLINIC_OR_DEPARTMENT_OTHER): Payer: Self-pay | Admitting: *Deleted

## 2015-03-29 DIAGNOSIS — Z79899 Other long term (current) drug therapy: Secondary | ICD-10-CM | POA: Diagnosis not present

## 2015-03-29 DIAGNOSIS — R109 Unspecified abdominal pain: Secondary | ICD-10-CM | POA: Insufficient documentation

## 2015-03-29 DIAGNOSIS — Z7982 Long term (current) use of aspirin: Secondary | ICD-10-CM | POA: Diagnosis not present

## 2015-03-29 DIAGNOSIS — N400 Enlarged prostate without lower urinary tract symptoms: Secondary | ICD-10-CM | POA: Diagnosis not present

## 2015-03-29 DIAGNOSIS — R0602 Shortness of breath: Secondary | ICD-10-CM | POA: Insufficient documentation

## 2015-03-29 DIAGNOSIS — Z8639 Personal history of other endocrine, nutritional and metabolic disease: Secondary | ICD-10-CM | POA: Insufficient documentation

## 2015-03-29 DIAGNOSIS — K219 Gastro-esophageal reflux disease without esophagitis: Secondary | ICD-10-CM | POA: Insufficient documentation

## 2015-03-29 DIAGNOSIS — I10 Essential (primary) hypertension: Secondary | ICD-10-CM | POA: Insufficient documentation

## 2015-03-29 DIAGNOSIS — M545 Low back pain: Secondary | ICD-10-CM | POA: Diagnosis present

## 2015-03-29 LAB — CBC WITH DIFFERENTIAL/PLATELET
BASOS ABS: 0 10*3/uL (ref 0.0–0.1)
Basophils Relative: 0 %
Eosinophils Absolute: 0.2 10*3/uL (ref 0.0–0.7)
Eosinophils Relative: 3 %
HEMATOCRIT: 44.3 % (ref 39.0–52.0)
Hemoglobin: 15 g/dL (ref 13.0–17.0)
LYMPHS PCT: 31 %
Lymphs Abs: 2.1 10*3/uL (ref 0.7–4.0)
MCH: 29.1 pg (ref 26.0–34.0)
MCHC: 33.9 g/dL (ref 30.0–36.0)
MCV: 85.9 fL (ref 78.0–100.0)
Monocytes Absolute: 0.6 10*3/uL (ref 0.1–1.0)
Monocytes Relative: 9 %
NEUTROS ABS: 3.8 10*3/uL (ref 1.7–7.7)
NEUTROS PCT: 57 %
Platelets: 169 10*3/uL (ref 150–400)
RBC: 5.16 MIL/uL (ref 4.22–5.81)
RDW: 14 % (ref 11.5–15.5)
WBC: 6.7 10*3/uL (ref 4.0–10.5)

## 2015-03-29 LAB — BASIC METABOLIC PANEL
ANION GAP: 8 (ref 5–15)
BUN: 17 mg/dL (ref 6–20)
CO2: 27 mmol/L (ref 22–32)
Calcium: 8.9 mg/dL (ref 8.9–10.3)
Chloride: 107 mmol/L (ref 101–111)
Creatinine, Ser: 1.24 mg/dL (ref 0.61–1.24)
GFR calc Af Amer: >60 mL/min (ref >60)
GFR, EST NON AFRICAN AMERICAN: 54 mL/min — AB (ref >60)
GLUCOSE: 104 mg/dL — AB (ref 65–99)
POTASSIUM: 4.2 mmol/L (ref 3.5–5.1)
SODIUM: 142 mmol/L (ref 135–145)

## 2015-03-29 LAB — URINALYSIS, ROUTINE W REFLEX MICROSCOPIC
Bilirubin Urine: NEGATIVE
GLUCOSE, UA: NEGATIVE mg/dL
Hgb urine dipstick: NEGATIVE
Ketones, ur: NEGATIVE mg/dL
LEUKOCYTES UA: NEGATIVE
NITRITE: NEGATIVE
PH: 6.5 (ref 5.0–8.0)
PROTEIN: NEGATIVE mg/dL
Specific Gravity, Urine: 1.01 (ref 1.005–1.030)

## 2015-03-29 MED ORDER — MORPHINE SULFATE (PF) 4 MG/ML IV SOLN
4.0000 mg | Freq: Once | INTRAVENOUS | Status: AC
  Administered 2015-03-29: 4 mg via INTRAVENOUS
  Filled 2015-03-29: qty 1

## 2015-03-29 MED ORDER — OXYCODONE-ACETAMINOPHEN 5-325 MG PO TABS: 1.0000 | ORAL_TABLET | Freq: Four times a day (QID) | ORAL | Status: DC | PRN

## 2015-03-29 NOTE — ED Notes (Signed)
Patient transported to X-ray 

## 2015-03-29 NOTE — Discharge Instructions (Signed)
Percocet as prescribed as needed for pain.  Follow-up with your primary Dr. if not improving in the next few days, and return to the ER if symptoms significantly worsen or change.   Flank Pain Flank pain refers to pain that is located on the side of the body between the upper abdomen and the back. The pain may occur over a short period of time (acute) or may be long-term or reoccurring (chronic). It may be mild or severe. Flank pain can be caused by many things. CAUSES  Some of the more common causes of flank pain include:  Muscle strains.   Muscle spasms.   A disease of your spine (vertebral disk disease).   A lung infection (pneumonia).   Fluid around your lungs (pulmonary edema).   A kidney infection.   Kidney stones.   A very painful skin rash caused by the chickenpox virus (shingles).   Gallbladder disease.  [place] care will depend on the cause of your pain. In general,  Rest as directed by your caregiver.  Drink enough fluids to keep your urine clear or pale yellow.  Only take over-the-counter or prescription medicines as directed by your caregiver. Some medicines may help relieve the pain.  Tell your caregiver about any changes in your pain.  Follow up with your caregiver as directed. SEEK IMMEDIATE MEDICAL CARE IF:   Your pain is not controlled with medicine.   You have new or worsening symptoms.  Your pain increases.   You have abdominal pain.   You have shortness of breath.   You have persistent nausea or vomiting.   You have swelling in your abdomen.   You feel faint or pass out.   You have blood in your urine.  You have a fever or persistent symptoms for more than 2-3 days.  You have a fever and your symptoms suddenly get worse. MAKE SURE YOU:   Understand these instructions.  Will watch your condition.  Will get help right away if you are not doing well or get worse.   This information is not  intended to replace advice given to you by your health care provider. Make sure you discuss any questions you have with your health care provider.   Document Released: 03/23/2005 Document Revised: 10/25/2011 Document Reviewed: 09/14/2011 Elsevier Interactive Patient Education [employer].

## 2015-03-29 NOTE — ED Notes (Signed)
Pt in room getting in gown

## 2015-03-29 NOTE — ED Notes (Signed)
Right flank pain since yesterday. States the pain comes and goes.

## 2015-03-29 NOTE — ED Provider Notes (Signed)
CSN: [id number]     Arrival date & time 03/29/15  1605 History  By signing my name below, I, Doran Stabler, attest that this documentation has been prepared under the direction and in the presence of Veryl Speak, MD. Electronically Signed: Doran Stabler, ED Scribe. 03/29/2015. 5:04 PM.  Chief Complaint  Patient presents with  . Back Pain   The history is provided by the patient. No language interpreter was used.   HPI Comments: Ricky Bradley is a 82 y.o. male with a PMHx of HTN, HLD, and GERD, who presents to the Emergency Department complaining of intermittent lower back pain for the past 4 days but constant pain since this morning. Pt also reports SOB secondary to pain. Usually his pain last a few minutes but today his pain has been persistent. Pt denies any aggravating or alleviating factors. Pt denies any radiation. Pt denies any hematuria, dysuria, fever, or any other sx at this time. Pt has a h/o a blood clot s/p surgery in his back.   Past Medical History  Diagnosis Date  . GERD (gastroesophageal reflux disease)   . High cholesterol   . Enlarged prostate   . High cholesterol   . Hypertension     no longer takes bp med-resolved.   Past Surgical History  Procedure Laterality Date  . Joint replacement  2001  and 2002    both knees  . Laminectomy  08/2010  . Back surgery    . Replacement total knee    . Cholecystectomy  11/01/10  . Anterior cervical decomp/discectomy fusion  01/16/2011    Procedure: ANTERIOR CERVICAL DECOMPRESSION/DISCECTOMY FUSION 1 LEVEL;  Surgeon: Ophelia Charter;  Location: [facility];  Service: Neurosurgery;  Laterality: N/A;  Cervical six-seven  Anterior Cervical Decomp[ression Fusion  . Anterior cervical decomp/discectomy fusion  01/23/2011    Procedure: ANTERIOR CERVICAL DECOMPRESSION/DISCECTOMY FUSION 1 LEVEL/HARDWARE REMOVAL;  Surgeon: Ophelia Charter;  Location: [place];  Service: Neurosurgery;  Laterality: N/A;  Evacuation of Cervical  Six-Seven Hematoma  . Eye surgery  2008  . Posterior cervical fusion/foraminotomy  11/06/2011    Procedure: POSTERIOR CERVICAL FUSION/FORAMINOTOMY LEVEL 1;  Surgeon: Ophelia Charter, MD;  Location: [place];  Service: Neurosurgery;  Laterality: N/A;  C67 laminectomy with posterior cervical fusion with instrumentation   No family history on file. Social History  Substance Use Topics  . Smoking status: Former Smoker -- 0.50 packs/day for 2 years    Types: Cigarettes    Quit date: 02/14/1971  . Smokeless tobacco: None  . Alcohol Use: No    Review of Systems  Constitutional: Negative for fever.  Respiratory: Positive for shortness of breath.   Genitourinary: Negative for dysuria and hematuria.  Musculoskeletal: Positive for back pain.  All other systems reviewed and are negative.   Allergies  Vicodin  Home Medications   Prior to Admission medications   Medication Sig Start Date End Date Taking? Authorizing Provider  aspirin 81 MG EC tablet Take 81 mg by mouth daily.     Historical Provider, MD  cetirizine (ZYRTEC) 10 MG tablet Take 5 mg by mouth 2 (two) times daily.    Historical Provider, MD  citalopram (CELEXA) 40 MG tablet Take 40 mg by mouth daily.    Historical Provider, MD  diazepam (VALIUM) 2 MG tablet Take 1 tablet (2 mg total) by mouth every 6 (six) hours as needed. 11/10/11   Newman Pies, MD  Docusate Calcium (STOOL SOFTENER PO) Take 1 tablet by  mouth at bedtime.    Historical Provider, MD  HYDROmorphone (DILAUDID) 4 MG tablet Take 1 tablet (4 mg total) by mouth every 4 (four) hours as needed. 11/10/11   Newman Pies, MD  Multiple Vitamin (MULTIVITAMIN WITH MINERALS) TABS Take 1 tablet by mouth daily.    Historical Provider, MD  pantoprazole (PROTONIX) 40 MG tablet Take 40 mg by mouth 2 (two) times daily.    Historical Provider, MD  Probiotic Product (PROBIOTIC DAILY PO) Take 1 tablet by mouth daily.    Historical Provider, MD  senna (SENOKOT) 8.6 MG TABS Take 1  tablet by mouth 2 (two) times daily as needed. For constipation.    Historical Provider, MD  simethicone (MYLICON) 80 MG chewable tablet Chew 80 mg by mouth 3 (three) times daily.    Historical Provider, MD  Tamsulosin HCl (FLOMAX) 0.4 MG CAPS Take 0.4 mg by mouth daily.     Historical Provider, MD   BP 163/111 mmHg  Pulse 96  Temp(Src) 98.2 F (36.8 C) (Oral)  Resp 18  Wt 173 lb (78.472 kg)  SpO2 100%   Physical Exam  Constitutional: He is oriented to person, place, and time. He appears well-developed and well-nourished. No distress.  HENT:  Head: Normocephalic and atraumatic.  Mouth/Throat: Oropharynx is clear and moist. No oropharyngeal exudate.  Eyes: Conjunctivae and EOM are normal. Pupils are equal, round, and reactive to light.  Neck: Normal range of motion. Neck supple.  No meningismus.  Cardiovascular: Normal rate, regular rhythm, normal heart sounds and intact distal pulses.   No murmur heard. Pulmonary/Chest: Effort normal and breath sounds normal. No respiratory distress.  Abdominal: Soft. Bowel sounds are normal. He exhibits no distension and no mass. There is no tenderness. There is no rebound and no guarding.  No pulsatile masses.   Musculoskeletal: Normal range of motion. He exhibits no edema or tenderness.  TTP in the right flank  Neurological: He is alert and oriented to person, place, and time. No cranial nerve deficit. He exhibits normal muscle tone. Coordination normal.  No ataxia on finger to nose bilaterally. No pronator drift. 5/5 strength throughout. CN 2-12 intact.Equal grip strength. Sensation intact.   Skin: Skin is warm.  Psychiatric: He has a normal mood and affect. His behavior is normal.  Nursing note and vitals reviewed.   ED Course  Procedures   DIAGNOSTIC STUDIES: Oxygen Saturation is 100% on room air, normal by my interpretation.    COORDINATION OF CARE: 4:54 PM Will give morphine. Will order CT renal stone study and blood work. Discussed  treatment plan with pt at bedside and pt agreed to plan.   Labs Review Labs Reviewed  BASIC METABOLIC PANEL  CBC WITH DIFFERENTIAL/PLATELET  URINALYSIS, ROUTINE W REFLEX MICROSCOPIC (NOT AT [facility])    Imaging Review No results found. I have personally reviewed and evaluated these images and lab results as part of my medical decision-making.   MDM   Final diagnoses:  None   Patient presents here with complaints of right flank pain. This is been ongoing for several days in the absence of any injury or trauma. His exam reveals mild tenderness in the right CVA, however is otherwise unremarkable. His urine is clear and CT scan reveals no evidence for a renal calculus. His pain became worse when he bent over to pick up a piece of paper he dropped on the floor. I highly suspect a musculoskeletal etiology. He is feeling somewhat better after morphine. He will be discharged home with  Percocet and when necessary follow-up if not improving.  I personally performed the services described in this documentation, which was scribed in my presence. The recorded information has been reviewed and is accurate.      Veryl Speak, MD 03/29/15 1901

## 2016-06-13 ENCOUNTER — Ambulatory Visit (INDEPENDENT_AMBULATORY_CARE_PROVIDER_SITE_OTHER): Payer: Medicare Other

## 2016-06-13 ENCOUNTER — Encounter (INDEPENDENT_AMBULATORY_CARE_PROVIDER_SITE_OTHER): Payer: Self-pay | Admitting: Orthopaedic Surgery

## 2016-06-13 ENCOUNTER — Ambulatory Visit (INDEPENDENT_AMBULATORY_CARE_PROVIDER_SITE_OTHER): Payer: Medicare Other | Admitting: Orthopaedic Surgery

## 2016-06-13 VITALS — BP 150/85 | HR 88 | Resp 12 | Ht 72.0 in | Wt 173.0 lb

## 2016-06-13 DIAGNOSIS — M25561 Pain in right knee: Secondary | ICD-10-CM | POA: Diagnosis not present

## 2016-06-13 DIAGNOSIS — M5417 Radiculopathy, lumbosacral region: Secondary | ICD-10-CM

## 2016-06-13 DIAGNOSIS — M5416 Radiculopathy, lumbar region: Secondary | ICD-10-CM

## 2016-06-13 DIAGNOSIS — Z96651 Presence of right artificial knee joint: Secondary | ICD-10-CM | POA: Diagnosis not present

## 2016-06-13 NOTE — Progress Notes (Signed)
Office Visit Note   Patient: Ricky Bradley           Date of Birth: [birth year]           MRN: [id number] Visit Date: 06/13/2016              Requested by: No referring provider defined for this encounter. PCP: Lynne Logan, MD (Inactive)   Assessment & Plan: Visit Diagnoses:  1. Presence of right artificial knee joint   2. Lumbar back pain with radiculopathy affecting right lower extremity   3. Acute pain of right knee     Plan: #1: Since he has no pain we will stay with conservative treatment. #2: Discussed with him the calcification in his aorta and needs to check with his medical doctor for further evaluation #3: Return if symptoms recur.  Follow-Up Instructions: Return if symptoms worsen or fail to improve.   Orders:  Orders Placed This Encounter  Procedures  . XR Lumbar Spine 2-3 Views  . XR KNEE 3 VIEW RIGHT   No orders of the defined types were placed in this encounter.     Procedures: No procedures performed   Clinical Data: No additional findings.   Subjective: Chief Complaint  Patient presents with  . Right Knee - Pain    Ricky Bradley is an 83 y o that presents with right knee pain x 1 week.  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 Hx of L TKA in 2000 and R TKA in 2001 and spinal surgery 2012 and 2013.    Ricky Bradley is a very robust 83 year old white male who is seen today for evaluation of his right knee and some low back pain. He states about 3 weeks ago he had several trees in his front yard that he was going to cut down. 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 He initially started having some low back pain and that it started to improved and then started having some right lateral proximal fibula type pain in that area. Then he started having more at the joint space laterally in the total knee replacement.  However today he has no pain at all in either area. Numbness or tingling. Denies any bowel or bladder symptoms. Again today he does not have any pain.    Review of Systems  Cardiovascular: Negative.        History of hypertension   Genitourinary: Urgency: hypertension.  Psychiatric/Behavioral:       History of anxiety  All other systems reviewed and are negative.    Objective: Vital Signs: BP (!) 150/85   Pulse 88   Resp 12   Ht 6' (1.829 m)   Wt 173 lb (78.5 kg)   BMI 23.46 kg/m   Physical Exam  Constitutional: He is oriented to person, place, and time. He appears well-developed and well-nourished.  HENT:  Head: Normocephalic and atraumatic.  Eyes: EOM are normal. Pupils are equal, round, and reactive to light.  Pulmonary/Chest: Effort normal.  Neurological: He is alert and oriented to person, place, and time.  Skin: Skin is warm and dry.  Psychiatric: He has a normal mood and affect. His behavior is normal. Judgment and thought content normal.    Right Knee Exam   Range of Motion  Right knee extension: 3.  Right  knee flexion: 105.   Tests  Varus: negative Valgus: negative  Other  Scars: present (Total knee) Sensation: normal Right knee pulse absent: Trace posterior.   Back Exam   Muscle Strength  Right Quadriceps:  4/5  Left Quadriceps:  4/5  Right Hamstrings:  4/5  Left Hamstrings:  4/5   Tests  Straight leg raise right: negative Straight leg raise left: negative  Reflexes  Patellar: 3/4 (Bilateral) Achilles: 1/4 (Bilateral)  Other  Sensation: normal      Specialty Comments:  No specialty comments available.  Imaging: Xr Knee 3 View Right  Result Date: 06/13/2016 Three-view x-ray of the right knee reveals good position alignment of the prosthesis. A little bit of osteopenia in the proximal tibia at the proximal tibia and cement interface. Stem appears to be well fixed. Patella femoral appears to be normal he is hyperflexed on the  exam.  Xr Lumbar Spine 2-3 Views  Result Date: 06/13/2016 Two-view lumbar spine reveals L5-S1 obliteration of the disc space with degenerative changes. 45 may have some narrowing also noted with maybe a grade 1 spinal listhesis more of a retrograde L4 on 5. Calcification the aorta without aneurysmal picture. Certainly has foraminal narrowing noted in the lower L4-S1.    PMFS History: Patient Active Problem List   Diagnosis Date Noted  . Myelopathy ([place]) 01/27/2011  . Gross hematuria 01/16/2011  . Cord compression ([place]) 01/10/2011  . Irritable bowel syndrome (IBS) 01/10/2011  . HTN (hypertension) 01/09/2011  . Dyslipidemia 01/09/2011  . BPH (benign prostatic hyperplasia) 01/09/2011  . GERD (gastroesophageal reflux disease) 01/09/2011  . Lumbar stenosis with neurogenic claudication 01/09/2011   Past Medical History:  Diagnosis Date  . Enlarged prostate   . GERD (gastroesophageal reflux disease)   . High cholesterol   . High cholesterol   . Hypertension    no longer takes bp med-resolved.    History reviewed. No pertinent family history.  Past Surgical History:  Procedure Laterality Date  . ANTERIOR CERVICAL DECOMP/DISCECTOMY FUSION  01/16/2011   Procedure: ANTERIOR CERVICAL DECOMPRESSION/DISCECTOMY FUSION 1 LEVEL;  Surgeon: Ophelia Charter;  Location: [facility];  Service: Neurosurgery;  Laterality: N/A;  Cervical six-seven  Anterior Cervical Decomp[ression Fusion  . ANTERIOR CERVICAL DECOMP/DISCECTOMY FUSION  01/23/2011   Procedure: ANTERIOR CERVICAL DECOMPRESSION/DISCECTOMY FUSION 1 LEVEL/HARDWARE REMOVAL;  Surgeon: Ophelia Charter;  Location: [place];  Service: Neurosurgery;  Laterality: N/A;  Evacuation of Cervical Six-Seven Hematoma  . BACK SURGERY    . CHOLECYSTECTOMY  11/01/10  . EYE SURGERY  2008  . JOINT REPLACEMENT  2001  and 2002   both knees  . laminectomy  08/2010  . POSTERIOR CERVICAL FUSION/FORAMINOTOMY  11/06/2011   Procedure: POSTERIOR CERVICAL  FUSION/FORAMINOTOMY LEVEL 1;  Surgeon: Ophelia Charter, MD;  Location: [place] [facility];  Service: Neurosurgery;  Laterality: N/A;  C67 laminectomy with posterior cervical fusion with instrumentation  . REPLACEMENT TOTAL KNEE     Social History   Occupational History  . Not on file.   Social History Main Topics  . Smoking status: Former Smoker    Packs/day: 0.50    Years: 2.00    Types: Cigarettes    Quit date: 02/14/1971  . Smokeless tobacco: Never Used  . Alcohol use No  . Drug use: No  . Sexual activity: No

## 2017-01-18 ENCOUNTER — Ambulatory Visit
Admission: RE | Admit: 2017-01-18 | Discharge: 2017-01-18 | Disposition: A | Discharge status: Home / Self Care | Payer: Medicare Other | Source: Ambulatory Visit | Attending: Family Medicine | Admitting: Family Medicine

## 2017-01-18 ENCOUNTER — Other Ambulatory Visit: Payer: Self-pay | Admitting: Family Medicine

## 2017-01-18 DIAGNOSIS — M25572 Pain in left ankle and joints of left foot: Secondary | ICD-10-CM

## 2017-07-10 ENCOUNTER — Other Ambulatory Visit: Payer: Self-pay | Admitting: Family Medicine

## 2017-07-10 ENCOUNTER — Ambulatory Visit
Admission: RE | Admit: 2017-07-10 | Discharge: 2017-07-10 | Disposition: A | Discharge status: Home / Self Care | Payer: Medicare Other | Source: Ambulatory Visit | Attending: Family Medicine | Admitting: Family Medicine

## 2017-07-10 DIAGNOSIS — M79605 Pain in left leg: Secondary | ICD-10-CM

## 2017-07-11 ENCOUNTER — Other Ambulatory Visit (HOSPITAL_COMMUNITY): Payer: Self-pay | Admitting: Family Medicine

## 2017-07-11 ENCOUNTER — Other Ambulatory Visit: Payer: Self-pay | Admitting: Family Medicine

## 2017-07-11 DIAGNOSIS — M79605 Pain in left leg: Secondary | ICD-10-CM

## 2017-07-11 DIAGNOSIS — M7989 Other specified soft tissue disorders: Principal | ICD-10-CM

## 2017-07-12 ENCOUNTER — Ambulatory Visit (HOSPITAL_COMMUNITY)
Admission: RE | Admit: 2017-07-12 | Discharge: 2017-07-12 | Disposition: A | Discharge status: Home / Self Care | Payer: Medicare Other | Source: Ambulatory Visit | Attending: Family Medicine | Admitting: Family Medicine

## 2017-07-12 DIAGNOSIS — M7989 Other specified soft tissue disorders: Secondary | ICD-10-CM | POA: Diagnosis not present

## 2017-07-12 DIAGNOSIS — M79605 Pain in left leg: Secondary | ICD-10-CM

## 2017-07-12 DIAGNOSIS — X58XXXS Exposure to other specified factors, sequela: Secondary | ICD-10-CM | POA: Insufficient documentation

## 2017-07-12 DIAGNOSIS — S8012XS Contusion of left lower leg, sequela: Secondary | ICD-10-CM | POA: Diagnosis present

## 2017-07-12 NOTE — Progress Notes (Signed)
Preliminary results by tech - Venous Duplex Left Leg Completed. Negative for deep and superficial vein thrombosis. There is a hematoma is noted in the area of concern. Oda Cogan, BS, RDMS, RVT

## 2019-03-13 ENCOUNTER — Ambulatory Visit: Payer: Medicare Other

## 2019-03-21 ENCOUNTER — Ambulatory Visit: Payer: Medicare Other | Attending: Internal Medicine

## 2019-03-21 DIAGNOSIS — Z23 Encounter for immunization: Secondary | ICD-10-CM | POA: Insufficient documentation

## 2019-03-21 NOTE — Progress Notes (Signed)
   Covid-19 Vaccination Clinic  Name:  Ricky Bradley    MRN: [id number] DOB: [birth year]  03/21/2019  [name] was observed post Covid-19 immunization for 15 minutes without incidence. He was provided with Vaccine Information Sheet and instruction to access the V-Safe system.   [name] was instructed to call 911 with any severe reactions post vaccine: Marland Kitchen Difficulty breathing  . Swelling of your face and throat  . A fast heartbeat  . A bad rash all over your body  . Dizziness and weakness    Immunizations Administered    Name Date Dose VIS Date Route   Pfizer COVID-19 Vaccine 03/21/2019  3:56 PM 0.3 mL 01/24/2019 Intramuscular   Manufacturer: [place]   Lot: [id number]   [place]: [id number]

## 2019-03-31 ENCOUNTER — Ambulatory Visit: Payer: Medicare Other

## 2019-04-01 ENCOUNTER — Ambulatory Visit: Payer: Medicare Other

## 2019-04-15 ENCOUNTER — Ambulatory Visit: Payer: Medicare Other | Attending: Internal Medicine

## 2019-04-15 DIAGNOSIS — Z23 Encounter for immunization: Secondary | ICD-10-CM

## 2019-04-15 NOTE — Progress Notes (Signed)
   Covid-19 Vaccination Clinic  Name:  Ricky Bradley    MRN: [id number] DOB: Jan 24, 1934  04/15/2019  Ricky Bradley was observed post Covid-19 immunization for 15 minutes without incident. He was provided with Vaccine Information Sheet and instruction to access the V-Safe system.   Ricky Bradley was instructed to call 911 with any severe reactions post vaccine: Marland Kitchen Difficulty breathing  . Swelling of face and throat  . A fast heartbeat  . A bad rash all over body  . Dizziness and weakness   Immunizations Administered    Name Date Dose VIS Date Route   Pfizer COVID-19 Vaccine 04/15/2019  8:53 AM 0.3 mL 01/24/2019 Intramuscular   Manufacturer: [place]   Lot: [id number]   [place]: [id number]

## 2021-03-01 ENCOUNTER — Other Ambulatory Visit: Payer: Self-pay

## 2021-03-01 ENCOUNTER — Encounter: Payer: Self-pay | Admitting: Cardiology

## 2021-03-01 ENCOUNTER — Ambulatory Visit: Payer: Medicare Other | Admitting: Cardiology

## 2021-03-01 ENCOUNTER — Inpatient Hospital Stay: Payer: Medicare Other

## 2021-03-01 VITALS — BP 131/73 | HR 97 | Temp 98.2°F | Ht 72.0 in | Wt 180.0 lb

## 2021-03-01 DIAGNOSIS — I4510 Unspecified right bundle-branch block: Secondary | ICD-10-CM

## 2021-03-01 DIAGNOSIS — N400 Enlarged prostate without lower urinary tract symptoms: Secondary | ICD-10-CM

## 2021-03-01 DIAGNOSIS — R0609 Other forms of dyspnea: Secondary | ICD-10-CM

## 2021-03-01 DIAGNOSIS — I10 Essential (primary) hypertension: Secondary | ICD-10-CM

## 2021-03-01 DIAGNOSIS — Z01810 Encounter for preprocedural cardiovascular examination: Secondary | ICD-10-CM

## 2021-03-01 DIAGNOSIS — E782 Mixed hyperlipidemia: Secondary | ICD-10-CM

## 2021-03-01 NOTE — Progress Notes (Signed)
Date:  03/01/2021   ID:  Ricky Bradley, DOB 03/28/1933, MRN [id number]  PCP:  Aletha Halim., PA-C  Cardiologist:  Rex Kras, DO, [facility] (established care 03/01/2021) Former Cardiology Providers: Dr. Caryl Comes (2012)  REASON FOR CONSULT: Fatigue, tachycardia, right bundle branch block  REQUESTING PHYSICIAN:  Pieter Partridge, [address] [place] [address] [place],  [place] [address]  Chief Complaint  Patient presents with   Right bundle branch block    New Patient (Initial Visit)    HPI  Ricky Bradley is a 88 y.o. Caucasian male who presents to the office with a chief complaint of "fatigue and RBBB." Patient's past medical history and cardiovascular risk factors include: Hypertension, hyperlipidemia, right bundle branch block, GERD, neurogenic claudication status postlaminectomy July 2012, BPH, advance age.   He is referred to the office at the request of Pieter Partridge, PA for evaluation of fatigue, tachycardia, right bundle branch block.  This patient is accompanied in the office by his child, Sharee Pimple. [place]UM [place] provides verbal consent with regards to having her present during today's encounter.  Majority of history of present illness is obtained by discussing his care with his daughter at today's visit.  It appears that during Christmas of 2022 he had a urinary tract infection which was treated with oral antibiotics.  It appears that he is following with urology being worked up for BPH.  He has a chronic indwelling Foley catheter since October 2022.  There have been discussions with regards to proceeding with TURP however due to the recent urinary tract infections, feeling tired, fatigue, and recent EKG noting a right bundle branch block patient is referred to cardiology for further evaluation and management.  His overall functional status remains very limited to his activities of daily living and ambulating within the house.  He does not do any over exertional activities.  No chest pain at  rest or with effort related activities.  He does complain of shortness of breath at times with exertion.  He denies orthopnea, paroxysmal nocturnal dyspnea lower extremity swelling.  Since treating the urinary tract infection his overall tired, fatigue, malaise has improved.  His heart rate has also improved.  FUNCTIONAL STATUS: Does his ADLs; however, no structured exercise program or daily routine due to back surgeries.  ALLERGIES: Allergies  Allergen Reactions   Vicodin [Hydrocodone-Acetaminophen] Hives    On neck and chest.    MEDICATION LIST PRIOR TO VISIT: Current Meds  Medication Sig   amLODipine (NORVASC) 5 MG tablet Take 1 tablet by mouth every evening.   aspirin 81 MG EC tablet Take 81 mg by mouth daily.    atorvastatin (LIPITOR) 10 MG tablet Take 10 mg by mouth daily.   benazepril (LOTENSIN) 10 MG tablet Take 10 mg by mouth daily.   busPIRone (BUSPAR) 7.5 MG tablet Take 7.5 mg by mouth 2 (two) times daily.   cetirizine (ZYRTEC) 10 MG tablet Take 1 tablet by mouth daily.   citalopram (CELEXA) 40 MG tablet Take 40 mg by mouth daily.   Docusate Calcium (STOOL SOFTENER PO) Take 1 tablet by mouth at bedtime.   finasteride (PROSCAR) 5 MG tablet Take 5 mg by mouth daily.   meloxicam (MOBIC) 7.5 MG tablet Take 7.5 mg by mouth daily.   Multiple Vitamin (MULTIVITAMIN WITH MINERALS) TABS Take 1 tablet by mouth daily.   NON FORMULARY Take 1 tablet by mouth daily. insta flex   Olopatadine HCl 0.2 % SOLN Place 1 drop into both  eyes daily.   Tamsulosin HCl (FLOMAX) 0.4 MG CAPS Take 0.4 mg by mouth daily.      PAST MEDICAL HISTORY: Past Medical History:  Diagnosis Date   Enlarged prostate    GERD (gastroesophageal reflux disease)    High cholesterol    High cholesterol    Hypertension    no longer takes bp med-resolved.   RBBB     PAST SURGICAL HISTORY: Past Surgical History:  Procedure Laterality Date   ANTERIOR CERVICAL DECOMP/DISCECTOMY FUSION  01/16/2011   Procedure:  ANTERIOR CERVICAL DECOMPRESSION/DISCECTOMY FUSION 1 LEVEL;  Surgeon: Ophelia Charter;  Location: [facility];  Service: Neurosurgery;  Laterality: N/A;  Cervical six-seven  Anterior Cervical Decomp[ression Fusion   ANTERIOR CERVICAL DECOMP/DISCECTOMY FUSION  01/23/2011   Procedure: ANTERIOR CERVICAL DECOMPRESSION/DISCECTOMY FUSION 1 LEVEL/HARDWARE REMOVAL;  Surgeon: Ophelia Charter;  Location: [facility];  Service: Neurosurgery;  Laterality: N/A;  Evacuation of Cervical Six-Seven Hematoma   BACK SURGERY     CHOLECYSTECTOMY  11/01/10   EYE SURGERY  2008   JOINT REPLACEMENT  2001  and 2002   both knees   laminectomy  08/2010   POSTERIOR CERVICAL FUSION/FORAMINOTOMY  11/06/2011   Procedure: POSTERIOR CERVICAL FUSION/FORAMINOTOMY LEVEL 1;  Surgeon: Ophelia Charter, MD;  Location: [facility];  Service: Neurosurgery;  Laterality: N/A;  C67 laminectomy with posterior cervical fusion with instrumentation   REPLACEMENT TOTAL KNEE      FAMILY HISTORY: No family history of premature coronary disease or sudden cardiac death.  SOCIAL HISTORY:  The patient  reports that he quit smoking about 50 years ago. His smoking use included cigarettes. He has a 1.00 pack-year smoking history. He has never used smokeless tobacco. He reports that he does not drink alcohol and does not use drugs.  REVIEW OF SYSTEMS: Review of Systems  Constitutional: Negative for chills and fever.  HENT:  Negative for hoarse voice and nosebleeds.        Hard of hearing  Eyes:  Negative for discharge, double vision and pain.  Cardiovascular:  Positive for dyspnea on exertion. Negative for chest pain, claudication, leg swelling, near-syncope, orthopnea, palpitations, paroxysmal nocturnal dyspnea and syncope.  Respiratory:  Negative for hemoptysis and shortness of breath.   Musculoskeletal:  Negative for muscle cramps and myalgias.  Gastrointestinal:  Negative for abdominal pain, constipation, diarrhea, hematemesis,  hematochezia, melena, nausea and vomiting.  Neurological:  Negative for dizziness and light-headedness.   PHYSICAL EXAM: Vitals with BMI 03/01/2021 06/13/2016 03/29/2015  Height 6\' 0"  6\' 0"  -  Weight 180 lbs 173 lbs -  BMI 42.87 68.1 -  Systolic 157 262 035  Diastolic 73 85 94  Pulse 97 88 74    CONSTITUTIONAL: Age-appropriate, hemodynamically stable, walks with a cane, well-developed and well-nourished. No acute distress.  SKIN: Skin is warm and dry. No rash noted. No cyanosis. No pallor. No jaundice HEAD: Normocephalic and atraumatic.  EYES: No scleral icterus MOUTH/THROAT: Moist oral membranes.  NECK: No JVD present. No thyromegaly noted. No carotid bruits  LYMPHATIC: No visible cervical adenopathy.  CHEST Normal respiratory effort. No intercostal retractions  LUNGS: Clear to auscultation bilaterally.  No stridor. No wheezes. No rales.  CARDIOVASCULAR: Regular rate and rhythm, positive S1-S2, no murmurs rubs or gallops appreciated. ABDOMINAL: Soft, nontender, nondistended, positive bowel sounds in all 4 quadrants, no apparent ascites.  EXTREMITIES: No peripheral edema, warm to touch, 2+ bilateral DP and PT pulses, poor nail hygiene, dry skin HEMATOLOGIC: No significant bruising NEUROLOGIC: Oriented to  person, place, and time. Nonfocal. Normal muscle tone.  PSYCHIATRIC: Normal mood and affect. Normal behavior. Cooperative  CARDIAC DATABASE: EKG: 03/01/2021: NSR, 92 bpm, RBBB.  Echocardiogram: No results found for this or any previous visit from the past 1095 days.    Stress Testing: No results found for this or any previous visit from the past 1095 days.   Heart Catheterization: None  LABORATORY DATA: CBC Latest Ref Rng & Units 03/29/2015 11/02/2011 10/03/2011  WBC 4.0 - 10.5 K/uL 6.7 7.0 9.3  Hemoglobin 13.0 - 17.0 g/dL 15.0 14.7 15.4  Hematocrit 39.0 - 52.0 % 44.3 43.4 44.9  Platelets 150 - 400 K/uL 169 215 178    CMP Latest Ref Rng & Units 03/29/2015 11/02/2011  10/03/2011  Glucose 65 - 99 mg/dL 104(H) 91 84  BUN 6 - 20 mg/dL 17 16 17   Creatinine 0.61 - 1.24 mg/dL 1.24 1.09 0.94  Sodium 135 - 145 mmol/L 142 138 140  Potassium 3.5 - 5.1 mmol/L 4.2 3.7 3.7  Chloride 101 - 111 mmol/L 107 102 102  CO2 22 - 32 mmol/L 27 26 26   Calcium 8.9 - 10.3 mg/dL 8.9 9.7 9.8  Total Protein 6.0 - 8.3 g/dL - - -  Total Bilirubin 0.3 - 1.2 mg/dL - - -  Alkaline Phos 39 - 117 U/L - - -  AST 0 - 37 U/L - - -  ALT 0 - 53 U/L - - -    Lipid Panel  No results found for: CHOL, TRIG, HDL, CHOLHDL, VLDL, LDLCALC, LDLDIRECT, LABVLDL  No components found for: NTPROBNP No results for input(s): PROBNP in the last 8760 hours. No results for input(s): TSH in the last 8760 hours.  BMP No results for input(s): NA, K, CL, CO2, GLUCOSE, BUN, CREATININE, CALCIUM, GFRNONAA, GFRAA in the last 8760 hours.  HEMOGLOBIN A1C Lab Results  Component Value Date   HGBA1C 6.4 (H) 01/19/2011   MPG 137 (H) 01/19/2011    IMPRESSION:    ICD-10-CM   1. RBBB (right bundle branch block)  I45.10 EKG 12-Lead    PCV ECHOCARDIOGRAM COMPLETE    LONG TERM MONITOR (3-14 DAYS)    PCV MYOCARDIAL PERFUSION WITH LEXISCAN    2. Dyspnea on exertion  R06.09 PCV ECHOCARDIOGRAM COMPLETE    LONG TERM MONITOR (3-14 DAYS)    3. Preop cardiovascular exam  Z01.810     4. Benign hypertension  I10 PCV ECHOCARDIOGRAM COMPLETE    LONG TERM MONITOR (3-14 DAYS)    5. Mixed hyperlipidemia  E78.2     6. Benign prostatic hyperplasia, unspecified whether lower urinary tract symptoms present  N40.0        RECOMMENDATIONS: SHERMAINE BRIGHAM is a 88 y.o. Caucasian male whose past medical history and cardiac risk factors include: Hypertension, hyperlipidemia, right bundle branch block, GERD, neurogenic claudication status postlaminectomy July 2012, BPH, advance age.   Patient is referred to the office for evaluation of tachycardia, fatigue, right bundle branch block.  I educated the patient's daughter that the  tachycardia was likely physiological and he was being treated for urinary tract infection.  Since the resolution of the urinary tract infection patient's heart rate has improved.  Review of electronic medical records from 2012 notes that he has had episodes of NSVT.  To better understand underlying rhythm recommend 7-day extended Holter monitor for further evaluation.  Further recommendations to follow.  Echocardiogram will be ordered to evaluate for structural heart disease and left ventricular systolic function.  Patient is also being  considered for TURP by his urologist.  His overall functional status is limited as he walks with a cane and no structured exercise program on daily routine.  Given his inability to exercise, unknown functional status, underlying RBBB, and upcoming surgery the shared decision was to proceed with Lexiscan stress test for further evaluation and management.  Further recommendations to follow.  Plan of care discussed with the patient and his daughter Sharee Pimple at today's office visit.  The questions and concerns were addressed to their satisfaction.  FINAL MEDICATION LIST END OF ENCOUNTER: No orders of the defined types were placed in this encounter.   Medications Discontinued During This Encounter  Medication Reason   cetirizine (ZYRTEC) 10 MG tablet Duplicate   diazepam (VALIUM) 2 MG tablet    HYDROmorphone (DILAUDID) 4 MG tablet    oxyCODONE-acetaminophen (PERCOCET) 5-325 MG tablet    simethicone (MYLICON) 80 MG chewable tablet    senna (SENOKOT) 8.6 MG TABS    Probiotic Product (PROBIOTIC DAILY PO)    pantoprazole (PROTONIX) 40 MG tablet      Current Outpatient Medications:    amLODipine (NORVASC) 5 MG tablet, Take 1 tablet by mouth every evening., Disp: , Rfl:    aspirin 81 MG EC tablet, Take 81 mg by mouth daily. , Disp: , Rfl:    atorvastatin (LIPITOR) 10 MG tablet, Take 10 mg by mouth daily., Disp: , Rfl:    benazepril (LOTENSIN) 10 MG tablet, Take 10 mg  by mouth daily., Disp: , Rfl:    busPIRone (BUSPAR) 7.5 MG tablet, Take 7.5 mg by mouth 2 (two) times daily., Disp: , Rfl:    cetirizine (ZYRTEC) 10 MG tablet, Take 1 tablet by mouth daily., Disp: , Rfl:    citalopram (CELEXA) 40 MG tablet, Take 40 mg by mouth daily., Disp: , Rfl:    Docusate Calcium (STOOL SOFTENER PO), Take 1 tablet by mouth at bedtime., Disp: , Rfl:    finasteride (PROSCAR) 5 MG tablet, Take 5 mg by mouth daily., Disp: , Rfl:    meloxicam (MOBIC) 7.5 MG tablet, Take 7.5 mg by mouth daily., Disp: , Rfl:    Multiple Vitamin (MULTIVITAMIN WITH MINERALS) TABS, Take 1 tablet by mouth daily., Disp: , Rfl:    NON FORMULARY, Take 1 tablet by mouth daily. insta flex, Disp: , Rfl:    Olopatadine HCl 0.2 % SOLN, Place 1 drop into both eyes daily., Disp: , Rfl:    Tamsulosin HCl (FLOMAX) 0.4 MG CAPS, Take 0.4 mg by mouth daily. , Disp: , Rfl:   Orders Placed This Encounter  Procedures   LONG TERM MONITOR (3-14 DAYS)   PCV MYOCARDIAL PERFUSION WITH LEXISCAN   EKG 12-Lead   PCV ECHOCARDIOGRAM COMPLETE    There are no Patient Instructions on file for this visit.   --Continue cardiac medications as reconciled in final medication list. --Return in about 4 weeks (around 03/29/2021) for Follow up . Or sooner if needed. --Continue follow-up with your primary care physician regarding the management of your other chronic comorbid conditions.  This note was created using a voice recognition software as a result there may be grammatical errors inadvertently enclosed that do not reflect the nature of this encounter. Every attempt is made to correct such errors.  Rex Kras, [place], [facility]  Pager: [phone] Office: [phone]

## 2021-03-10 ENCOUNTER — Other Ambulatory Visit: Payer: Medicare Other

## 2021-03-28 ENCOUNTER — Other Ambulatory Visit: Payer: Medicare Other

## 2021-03-28 NOTE — Progress Notes (Signed)
Pts daughter stated that the pt was not feeling well after wearing the monitor. Pt has decided not to do his prostate surgery and does not want to do the stress test or echo. He also canceled his follow-up. Pt HR has been elevated and pt has not been sleeping. Pt feels very anxious. PCP wants him to follow-up with [place]. They are unsure of what to do. Please advise. Pt would like a response through MyChart.

## 2021-04-01 ENCOUNTER — Telehealth: Payer: Self-pay | Admitting: Cardiology

## 2021-04-01 NOTE — Telephone Encounter (Signed)
Patient's daughter Sharee Pimple says she spoke with Amber on Monday in regards to her father, the patient. She says Museum/gallery conservator was going to send a note or message to Dr. Terri Skains so he could be in contact with Sharee Pimple, but she has not heard back yet and she's concerned. Can call Sharee Pimple on her cell, but she is asking to have Dr. Terri Skains reach out on MyChart as she will be able to check this today while she is at work. Thank you.

## 2021-04-01 NOTE — Telephone Encounter (Signed)
I sent you the following message on pts monitor results 03/28/2021:   Pts daughter stated that the pt was not feeling well after wearing the monitor. Pt has decided not to do his prostate surgery and does not want to do the stress test or echo. He also canceled his follow-up. Pt HR has been elevated and pt has not been sleeping. Pt feels very anxious. PCP wants him to follow-up with [place]. They are unsure of what to do. Please advise. Pt would like a response through MyChart.

## 2021-04-03 ENCOUNTER — Emergency Department (HOSPITAL_BASED_OUTPATIENT_CLINIC_OR_DEPARTMENT_OTHER)
Admission: EM | Admit: 2021-04-03 | Discharge: 2021-04-03 | Disposition: A | Discharge status: Home / Self Care | Payer: Medicare Other | Attending: Emergency Medicine | Admitting: Emergency Medicine

## 2021-04-03 ENCOUNTER — Other Ambulatory Visit: Payer: Self-pay

## 2021-04-03 ENCOUNTER — Encounter (HOSPITAL_BASED_OUTPATIENT_CLINIC_OR_DEPARTMENT_OTHER): Payer: Self-pay | Admitting: Urology

## 2021-04-03 DIAGNOSIS — T83031A Leakage of indwelling urethral catheter, initial encounter: Secondary | ICD-10-CM | POA: Diagnosis present

## 2021-04-03 DIAGNOSIS — Y846 Urinary catheterization as the cause of abnormal reaction of the patient, or of later complication, without mention of misadventure at the time of the procedure: Secondary | ICD-10-CM | POA: Diagnosis not present

## 2021-04-03 DIAGNOSIS — Z7982 Long term (current) use of aspirin: Secondary | ICD-10-CM | POA: Insufficient documentation

## 2021-04-03 DIAGNOSIS — T839XXA Unspecified complication of genitourinary prosthetic device, implant and graft, initial encounter: Secondary | ICD-10-CM

## 2021-04-03 NOTE — ED Provider Notes (Signed)
[facility] Provider Note   CSN: [id number] Arrival date & time: 04/03/21  1837     History  Chief Complaint  Patient presents with   Catheter bag leaking    Ricky Bradley is a 88 y.o. male.  HPI  Patient with c catheter bag leaking.  Started a few hours ago, not having any pain, fevers, nausea, vomiting.  States he just needs a new bag, he is followed by urology and has replacements monthly.  Home Medications Prior to Admission medications   Medication Sig Start Date End Date Taking? Authorizing Provider  amLODipine (NORVASC) 5 MG tablet Take 1 tablet by mouth every evening. 09/20/20   [provider]  aspirin 81 MG EC tablet Take 81 mg by mouth daily.     [provider]  atorvastatin (LIPITOR) 10 MG tablet Take 10 mg by mouth daily. 01/07/21   [provider]  benazepril (LOTENSIN) 10 MG tablet Take 10 mg by mouth daily. 12/18/20   [provider]  busPIRone (BUSPAR) 7.5 MG tablet Take 7.5 mg by mouth 2 (two) times daily. 02/09/21   [provider]  cetirizine (ZYRTEC) 10 MG tablet Take 1 tablet by mouth daily. 11/17/20   [provider]  citalopram (CELEXA) 40 MG tablet Take 40 mg by mouth daily.    [provider]  Docusate Calcium (STOOL SOFTENER PO) Take 1 tablet by mouth at bedtime.    [provider]  finasteride (PROSCAR) 5 MG tablet Take 5 mg by mouth daily. 02/10/21   [provider]  meloxicam (MOBIC) 7.5 MG tablet Take 7.5 mg by mouth daily. 02/24/21   [provider]  Multiple Vitamin (MULTIVITAMIN WITH MINERALS) TABS Take 1 tablet by mouth daily.    [provider]  NON FORMULARY Take 1 tablet by mouth daily. insta flex    [provider]  Olopatadine HCl 0.2 % SOLN Place 1 drop into both eyes daily. 11/26/18   [provider]  Tamsulosin HCl (FLOMAX) 0.4 MG CAPS Take 0.4 mg by mouth daily.     [provider]       Allergies    Vicodin [hydrocodone-acetaminophen]    Review of Systems   Review of Systems  Physical Exam Updated Vital Signs BP 139/83 (BP Location: Right Arm)    Pulse 92    Resp 18    Ht 6' (1.829 m)    Wt 81.6 kg    SpO2 98%    BMI 24.40 kg/m  Physical Exam Vitals and nursing note reviewed. Exam conducted with a chaperone present.  Constitutional:      General: He is not in acute distress.    Appearance: Normal appearance.  HENT:     Head: Normocephalic and atraumatic.  Eyes:     General: No scleral icterus.    Extraocular Movements: Extraocular movements intact.     Pupils: Pupils are equal, round, and reactive to light.  Abdominal:     General: Abdomen is flat.     Palpations: Abdomen is soft.  Skin:    Coloration: Skin is not jaundiced.  Neurological:     Mental Status: He is alert. Mental status is at baseline.     Coordination: Coordination normal.    ED Results / Procedures / Treatments   Labs (all labs ordered are listed, but only abnormal results are displayed) Labs Reviewed - No data to display  EKG None  Radiology No results found.  Procedures Procedures  Medications Ordered in ED Medications - No data to display  ED Course/ Medical Decision Making/ A&P                           Medical Decision Making   Patient is asymptomatic just requiring catheter replacement.  Abdomen is soft, vitals stable.  No systemic symptoms, consider further work-up but based on this presentation do not think it is warranted.  Also considered urine culture but given he is chronically catheterized do not think this would be particularly helpful.    Catheter bag replaced, discharged in stable condition.        Final Clinical Impression(s) / ED Diagnoses Final diagnoses:  None    Rx / DC Orders ED Discharge Orders     None         Sherrill Raring, Hershal Coria 04/03/21 2251    Drenda Freeze, MD 04/04/21 1550

## 2021-04-03 NOTE — Discharge Instructions (Signed)
Follow up with urology

## 2021-04-03 NOTE — ED Triage Notes (Signed)
States catheter bag leaking at port  No pain  Just needs new bag

## 2021-04-03 NOTE — ED Notes (Signed)
Leg bag changed per provider. No other symptoms noted

## 2021-04-04 ENCOUNTER — Encounter: Payer: Self-pay | Admitting: Cardiology

## 2021-04-04 ENCOUNTER — Ambulatory Visit: Payer: Medicare Other | Admitting: Cardiology

## 2021-04-04 NOTE — Telephone Encounter (Signed)
From patient.

## 2021-04-05 NOTE — Telephone Encounter (Signed)
Spoke to the patient's daughter Sharee Pimple at length regarding her questions and concerns.  I also apologized for calling her back couple days later, she was understanding.  Patient has been started on trazodone for his restless leg syndrome/anxiety and since then he is sleeping much better at night.  For now patient has decided not to undergo TURP and therefore will hold off on stress testing at this time.  I would still advocate for an echocardiogram which is reasonable given his age and comorbid conditions.  This will also help to figure out which AV nodal blocking agents to use if needed.  I have also encouraged the patient's daughter to discuss possible sleep study as the patient is experiencing fragmented sleep and is on multiple pharmacological agents.  Will defer to primary team for now.  Front staff: Please reach out to the patient's daughter to schedule an echocardiogram.  Rex Kras, DO, [facility]

## 2021-04-06 NOTE — Telephone Encounter (Signed)
Echo is scheduled 04/14/2021 at 11:30 am.

## 2021-04-14 ENCOUNTER — Other Ambulatory Visit: Payer: Medicare Other

## 2021-04-21 ENCOUNTER — Other Ambulatory Visit: Payer: Self-pay

## 2021-04-21 ENCOUNTER — Ambulatory Visit: Payer: Medicare Other

## 2021-04-21 DIAGNOSIS — I10 Essential (primary) hypertension: Secondary | ICD-10-CM

## 2021-04-21 DIAGNOSIS — I4510 Unspecified right bundle-branch block: Secondary | ICD-10-CM

## 2021-04-21 DIAGNOSIS — R0609 Other forms of dyspnea: Secondary | ICD-10-CM

## 2021-04-28 NOTE — Progress Notes (Signed)
Called and spoke with patient daughter regarding patients' echocardiogram results.

## 2021-06-06 ENCOUNTER — Other Ambulatory Visit: Payer: Self-pay | Admitting: Family Medicine

## 2021-06-06 DIAGNOSIS — M79661 Pain in right lower leg: Secondary | ICD-10-CM

## 2021-06-08 ENCOUNTER — Ambulatory Visit
Admission: RE | Admit: 2021-06-08 | Discharge: 2021-06-08 | Disposition: A | Discharge status: Home / Self Care | Payer: Medicare Other | Source: Ambulatory Visit | Attending: Family Medicine | Admitting: Family Medicine

## 2021-06-08 DIAGNOSIS — M79661 Pain in right lower leg: Secondary | ICD-10-CM

## 2021-07-21 ENCOUNTER — Emergency Department (HOSPITAL_COMMUNITY)
Admission: EM | Admit: 2021-07-21 | Discharge: 2021-07-22 | Disposition: A | Discharge status: Home / Self Care | Payer: Medicare Other | Attending: Emergency Medicine | Admitting: Emergency Medicine

## 2021-07-21 ENCOUNTER — Encounter (HOSPITAL_COMMUNITY): Payer: Self-pay

## 2021-07-21 DIAGNOSIS — C679 Malignant neoplasm of bladder, unspecified: Secondary | ICD-10-CM | POA: Insufficient documentation

## 2021-07-21 DIAGNOSIS — R310 Gross hematuria: Secondary | ICD-10-CM | POA: Diagnosis present

## 2021-07-21 DIAGNOSIS — Z7982 Long term (current) use of aspirin: Secondary | ICD-10-CM | POA: Insufficient documentation

## 2021-07-21 DIAGNOSIS — N329 Bladder disorder, unspecified: Secondary | ICD-10-CM

## 2021-07-21 NOTE — ED Triage Notes (Signed)
Pt bib by [facility] from home. Pt has had continuous bleeding from catheter. Ems reports pt has loss at least 1 L of blood. Upon arrival pt catheter filled with 300 ml of blood. Pt reports bleeding starting 3 hours ago.

## 2021-07-22 ENCOUNTER — Encounter (HOSPITAL_COMMUNITY): Payer: Self-pay | Admitting: Emergency Medicine

## 2021-07-22 ENCOUNTER — Emergency Department (HOSPITAL_COMMUNITY): Payer: Medicare Other

## 2021-07-22 LAB — URINALYSIS, ROUTINE W REFLEX MICROSCOPIC

## 2021-07-22 LAB — URINALYSIS, MICROSCOPIC (REFLEX)
RBC / HPF: >50 RBC/hpf (ref 0–5)
Squamous Epithelial / HPF: NONE SEEN (ref 0–5)

## 2021-07-22 LAB — CBC WITH DIFFERENTIAL/PLATELET
Abs Immature Granulocytes: 0.08 10*3/uL — ABNORMAL HIGH (ref 0.00–0.07)
Basophils Absolute: 0 10*3/uL (ref 0.0–0.1)
Basophils Relative: 1 %
Eosinophils Absolute: 0.2 10*3/uL (ref 0.0–0.5)
Eosinophils Relative: 3 %
HCT: 40.3 % (ref 39.0–52.0)
Hemoglobin: 14 g/dL (ref 13.0–17.0)
Immature Granulocytes: 1 %
Lymphocytes Relative: 27 %
Lymphs Abs: 2.1 10*3/uL (ref 0.7–4.0)
MCH: 30.5 pg (ref 26.0–34.0)
MCHC: 34.7 g/dL (ref 30.0–36.0)
MCV: 87.8 fL (ref 80.0–100.0)
Monocytes Absolute: 0.8 10*3/uL (ref 0.1–1.0)
Monocytes Relative: 10 %
Neutro Abs: 4.5 10*3/uL (ref 1.7–7.7)
Neutrophils Relative %: 58 %
Platelets: 176 10*3/uL (ref 150–400)
RBC: 4.59 MIL/uL (ref 4.22–5.81)
RDW: 12.6 % (ref 11.5–15.5)
WBC: 7.7 10*3/uL (ref 4.0–10.5)
nRBC: 0 % (ref 0.0–0.2)

## 2021-07-22 LAB — COMPREHENSIVE METABOLIC PANEL
ALT: 15 U/L (ref 0–44)
AST: 15 U/L (ref 15–41)
Albumin: 3.7 g/dL (ref 3.5–5.0)
Alkaline Phosphatase: 73 U/L (ref 38–126)
Anion gap: 9 (ref 5–15)
BUN: 22 mg/dL (ref 8–23)
CO2: 23 mmol/L (ref 22–32)
Calcium: 9.2 mg/dL (ref 8.9–10.3)
Chloride: 100 mmol/L (ref 98–111)
Creatinine, Ser: 0.97 mg/dL (ref 0.61–1.24)
GFR, Estimated: >60 mL/min (ref >60)
Glucose, Bld: 108 mg/dL — ABNORMAL HIGH (ref 70–99)
Potassium: 4 mmol/L (ref 3.5–5.1)
Sodium: 132 mmol/L — ABNORMAL LOW (ref 135–145)
Total Bilirubin: 0.8 mg/dL (ref 0.3–1.2)
Total Protein: 6.4 g/dL — ABNORMAL LOW (ref 6.5–8.1)

## 2021-07-22 LAB — I-STAT CHEM 8, ED
BUN: 22 mg/dL (ref 8–23)
Calcium, Ion: 1.1 mmol/L — ABNORMAL LOW (ref 1.15–1.40)
Chloride: 98 mmol/L (ref 98–111)
Creatinine, Ser: 1 mg/dL (ref 0.61–1.24)
Glucose, Bld: 108 mg/dL — ABNORMAL HIGH (ref 70–99)
HCT: 40 % (ref 39.0–52.0)
Hemoglobin: 13.6 g/dL (ref 13.0–17.0)
Potassium: 4.1 mmol/L (ref 3.5–5.1)
Sodium: 130 mmol/L — ABNORMAL LOW (ref 135–145)
TCO2: 23 mmol/L (ref 22–32)

## 2021-07-22 LAB — TYPE AND SCREEN
ABO/RH(D): O NEG
Antibody Screen: NEGATIVE

## 2021-07-22 LAB — PROTIME-INR
INR: 1 (ref 0.8–1.2)
Prothrombin Time: 13.5 seconds (ref 11.4–15.2)

## 2021-07-22 MED ORDER — SODIUM CHLORIDE 0.9 % IV BOLUS
500.0000 mL | Freq: Once | INTRAVENOUS | Status: AC
  Administered 2021-07-22: 500 mL via INTRAVENOUS

## 2021-07-22 MED ORDER — FENTANYL CITRATE PF 50 MCG/ML IJ SOSY
25.0000 ug | PREFILLED_SYRINGE | Freq: Once | INTRAMUSCULAR | Status: AC
  Administered 2021-07-22: 25 ug via INTRAVENOUS
  Filled 2021-07-22: qty 1

## 2021-07-22 MED ORDER — CEPHALEXIN 500 MG PO CAPS: 500.0000 mg | ORAL_CAPSULE | Freq: Three times a day (TID) | ORAL | 0 refills | Status: DC

## 2021-07-22 MED ORDER — TRANEXAMIC ACID-NACL 1000-0.7 MG/100ML-% IV SOLN: 1000.0000 mg | INTRAVENOUS | Status: DC

## 2021-07-22 MED ORDER — SODIUM CHLORIDE 0.9 % IV SOLN
1.0000 g | Freq: Once | INTRAVENOUS | Status: AC
  Administered 2021-07-22: 1 g via INTRAVENOUS
  Filled 2021-07-22: qty 10

## 2021-07-22 NOTE — ED Provider Notes (Signed)
[facility] DEPT Provider Note   CSN: [id number] Arrival date & time: 07/21/21  2335     History  Chief Complaint  Patient presents with   Coagulation Disorder    Ricky Bradley is a 88 y.o. male.  The history is provided by the patient.  Hematuria This is a recurrent problem. The current episode started more than 2 days ago. The problem occurs constantly. The problem has not changed since onset.Pertinent negatives include no chest pain, no abdominal pain, no headaches and no shortness of breath. Nothing aggravates the symptoms. Nothing relieves the symptoms. He has tried nothing for the symptoms. The treatment provided no relief.  Patient with gross hematuria for 4 days.  Had foley changed yesterday and patient's urologist was aware of symptoms.  They have continued and patient was brought in for evaluation.       Home Medications Prior to Admission medications   Medication Sig Start Date End Date Taking? Authorizing Provider  amLODipine (NORVASC) 5 MG tablet Take 5 mg by mouth every evening. 09/20/20  Yes [provider]  aspirin 81 MG EC tablet Take 81 mg by mouth daily.    Yes [provider]  atorvastatin (LIPITOR) 10 MG tablet Take 10 mg by mouth daily. 01/07/21  Yes [provider]  benazepril (LOTENSIN) 10 MG tablet Take 10 mg by mouth daily. 12/18/20  Yes [provider]  cephALEXin (KEFLEX) 500 MG capsule Take 1 capsule (500 mg total) by mouth 3 (three) times daily. 07/22/21  Yes Ly Bacchi, MD  cetirizine (ZYRTEC) 10 MG tablet Take 10 mg by mouth at bedtime. 11/17/20  Yes [provider]  citalopram (CELEXA) 20 MG tablet Take 20 mg by mouth daily.   Yes [provider]  Docusate Calcium (STOOL SOFTENER PO) Take 1 capsule by mouth at bedtime.   Yes [provider]  doxycycline (VIBRA-TABS) 100 MG tablet Take 100 mg by mouth 2 (two) times daily. 07/15/21  Yes [provider]   finasteride (PROSCAR) 5 MG tablet Take 5 mg by mouth daily. 02/10/21  Yes [provider]  meloxicam (MOBIC) 7.5 MG tablet Take 7.5 mg by mouth daily. 02/24/21  Yes [provider]  NON FORMULARY Take 1 tablet by mouth daily. insta flex   Yes [provider]  Olopatadine HCl 0.2 % SOLN Place 1 drop into both eyes daily as needed (for allergies). 11/26/18  Yes [provider]  polyethylene glycol (MIRALAX / GLYCOLAX) 17 g packet Take 17 g by mouth daily.   Yes [provider]  Tamsulosin HCl (FLOMAX) 0.4 MG CAPS Take 0.4 mg by mouth 2 (two) times daily.   Yes [provider]  traZODone (DESYREL) 50 MG tablet Take 75 mg by mouth at bedtime. 07/12/21  Yes [provider]      Allergies    Vicodin [hydrocodone-acetaminophen]    Review of Systems   Review of Systems  Constitutional:  Negative for fever.  HENT:  Negative for facial swelling.   Respiratory:  Negative for shortness of breath.   Cardiovascular:  Negative for chest pain.  Gastrointestinal:  Negative for abdominal pain.  Genitourinary:  Positive for hematuria.  Neurological:  Negative for headaches.  All other systems reviewed and are negative.   Physical Exam Updated Vital Signs BP 117/63   Pulse 91   Temp 97.9 F (36.6 C)   Resp (!) 22   SpO2 96%  Physical Exam Vitals and nursing note reviewed.  Constitutional:      General: He is not in acute distress.    Appearance: Normal appearance. He is well-developed.  HENT:     Head: Normocephalic and atraumatic.     Nose: Nose normal.     Mouth/Throat:     Mouth: Mucous membranes are moist.  Eyes:     Comments: Normal appearance  Cardiovascular:     Rate and Rhythm: Normal rate and regular rhythm.  Pulmonary:     Effort: Pulmonary effort is normal. No respiratory distress.     Breath sounds: Normal breath sounds.  Abdominal:     General: Abdomen is flat. Bowel sounds are normal. There is no distension.      Palpations: Abdomen is soft. There is no mass.     Tenderness: There is no abdominal tenderness. There is no guarding or rebound.  Genitourinary:    Comments: No CVA tenderness Musculoskeletal:        General: Normal range of motion.     Cervical back: Normal range of motion and neck supple.  Skin:    General: Skin is warm and dry.     Capillary Refill: Capillary refill takes less than 2 seconds.     Findings: No rash.  Neurological:     General: No focal deficit present.     Mental Status: He is alert.     Deep Tendon Reflexes: Reflexes normal.  Psychiatric:        Behavior: Behavior normal.     ED Results / Procedures / Treatments   Labs (all labs ordered are listed, but only abnormal results are displayed) Results for orders placed or performed during the hospital encounter of 07/21/21  CBC with Differential  Result Value Ref Range   WBC 7.7 4.0 - 10.5 K/uL   RBC 4.59 4.22 - 5.81 MIL/uL   Hemoglobin 14.0 13.0 - 17.0 g/dL   HCT 40.3 39.0 - 52.0 %   MCV 87.8 80.0 - 100.0 fL   MCH 30.5 26.0 - 34.0 pg   MCHC 34.7 30.0 - 36.0 g/dL   RDW 12.6 11.5 - 15.5 %   Platelets 176 150 - 400 K/uL   nRBC 0.0 0.0 - 0.2 %   Neutrophils Relative % 58 %   Neutro Abs 4.5 1.7 - 7.7 K/uL   Lymphocytes Relative 27 %   Lymphs Abs 2.1 0.7 - 4.0 K/uL   Monocytes Relative 10 %   Monocytes Absolute 0.8 0.1 - 1.0 K/uL   Eosinophils Relative 3 %   Eosinophils Absolute 0.2 0.0 - 0.5 K/uL   Basophils Relative 1 %   Basophils Absolute 0.0 0.0 - 0.1 K/uL   Immature Granulocytes 1 %   Abs Immature Granulocytes 0.08 (H) 0.00 - 0.07 K/uL  Urinalysis, Routine w reflex microscopic Urine, Clean Catch  Result Value Ref Range   Color, Urine RED (A) YELLOW   APPearance TURBID (A) CLEAR   Specific Gravity, Urine  1.005 - 1.030    TEST NOT REPORTED DUE TO COLOR INTERFERENCE OF URINE PIGMENT   pH  5.0 - 8.0    TEST NOT REPORTED DUE TO COLOR INTERFERENCE OF URINE PIGMENT   Glucose, UA (A) NEGATIVE mg/dL     TEST NOT REPORTED DUE TO COLOR INTERFERENCE OF URINE PIGMENT   Hgb urine dipstick (A) NEGATIVE    TEST NOT REPORTED DUE TO COLOR INTERFERENCE OF URINE PIGMENT   Bilirubin Urine (A) NEGATIVE    TEST NOT REPORTED DUE TO COLOR INTERFERENCE OF URINE PIGMENT  Ketones, ur (A) NEGATIVE mg/dL    TEST NOT REPORTED DUE TO COLOR INTERFERENCE OF URINE PIGMENT   Protein, ur (A) NEGATIVE mg/dL    TEST NOT REPORTED DUE TO COLOR INTERFERENCE OF URINE PIGMENT   Nitrite (A) NEGATIVE    TEST NOT REPORTED DUE TO COLOR INTERFERENCE OF URINE PIGMENT   Leukocytes,Ua (A) NEGATIVE    TEST NOT REPORTED DUE TO COLOR INTERFERENCE OF URINE PIGMENT  Comprehensive metabolic panel  Result Value Ref Range   Sodium 132 (L) 135 - 145 mmol/L   Potassium 4.0 3.5 - 5.1 mmol/L   Chloride 100 98 - 111 mmol/L   CO2 23 22 - 32 mmol/L   Glucose, Bld 108 (H) 70 - 99 mg/dL   BUN 22 8 - 23 mg/dL   Creatinine, Ser 0.97 0.61 - 1.24 mg/dL   Calcium 9.2 8.9 - 10.3 mg/dL   Total Protein 6.4 (L) 6.5 - 8.1 g/dL   Albumin 3.7 3.5 - 5.0 g/dL   AST 15 15 - 41 U/L   ALT 15 0 - 44 U/L   Alkaline Phosphatase 73 38 - 126 U/L   Total Bilirubin 0.8 0.3 - 1.2 mg/dL   GFR, Estimated >60 >60 mL/min   Anion gap 9 5 - 15  Protime-INR  Result Value Ref Range   Prothrombin Time 13.5 11.4 - 15.2 seconds   INR 1.0 0.8 - 1.2  Urinalysis, Microscopic (reflex)  Result Value Ref Range   RBC / HPF >50 0 - 5 RBC/hpf   WBC, UA 0-5 0 - 5 WBC/hpf   Bacteria, UA RARE (A) NONE SEEN   Squamous Epithelial / LPF NONE SEEN 0 - 5   Mucus PRESENT    Urine-Other MICROSCOPIC EXAM PERFORMED ON UNCONCENTRATED URINE   I-stat chem 8, ED (not at [facility] or [facility])  Result Value Ref Range   Sodium 130 (L) 135 - 145 mmol/L   Potassium 4.1 3.5 - 5.1 mmol/L   Chloride 98 98 - 111 mmol/L   BUN 22 8 - 23 mg/dL   Creatinine, Ser 1.00 0.61 - 1.24 mg/dL   Glucose, Bld 108 (H) 70 - 99 mg/dL   Calcium, Ion 1.10 (L) 1.15 - 1.40 mmol/L   TCO2 23 22 - 32 mmol/L    Hemoglobin 13.6 13.0 - 17.0 g/dL   HCT 40.0 39.0 - 52.0 %  Type and screen [place]  Result Value Ref Range   ABO/RH(D) O NEG    Antibody Screen NEG    Sample Expiration      07/24/2021,2359 Performed at [facility], [place] [address] [place], [place] [address]    CT Renal Stone Study  Result Date: 07/22/2021 CLINICAL DATA:  Gross hematuria. EXAM: CT ABDOMEN AND PELVIS WITHOUT CONTRAST TECHNIQUE: Multidetector CT imaging of the abdomen and pelvis was performed following the standard protocol without IV contrast. RADIATION DOSE REDUCTION: This exam was performed according to the departmental dose-optimization program which includes automated exposure control, adjustment of the mA and/or kV according to patient size and/or use of iterative reconstruction technique. COMPARISON:  March 29, 2015 FINDINGS: Lower chest: No acute abnormality. Hepatobiliary: No focal liver abnormality is seen. Status post cholecystectomy. No biliary dilatation. Pancreas: Unremarkable. No pancreatic ductal dilatation or surrounding inflammatory changes. Spleen: Normal in size without focal abnormality. Adrenals/Urinary Tract: Adrenal glands are unremarkable. The kidneys are normal, without renal calculi or focal lesions. There is mild bilateral hydronephrosis and hydroureter. A Foley catheter is seen within the urinary  bladder. Its insufflator bulb is surrounded by a moderate amount of mildly hyperdense material (approximate 50.84 Hounsfield units). Stomach/Bowel: Stomach is within normal limits. Appendix appears normal. No evidence of bowel wall thickening, distention, or inflammatory changes. Noninflamed diverticula are seen within the descending and sigmoid colon. Vascular/Lymphatic: Aortic atherosclerosis. No enlarged abdominal or pelvic lymph nodes. Reproductive: There is moderate to marked severity prostate gland enlargement. Other: No abdominal wall hernia or abnormality. No  abdominopelvic ascites. Musculoskeletal: Marked severity degenerative changes are seen at the levels of L4-L5 and L5-S1. IMPRESSION: 1. Foley catheter within the urinary bladder insufflator bulb is surrounded by a moderate amount of blood products. Sequelae associated with an underlying hemorrhagic neoplasm cannot be excluded. 2. Moderate to marked severity prostatomegaly. 3. Colonic diverticulosis. 4. Marked severity degenerative changes at the levels of L4-L5 and L5-S1. 5. Aortic atherosclerosis. Aortic Atherosclerosis (ICD10-I70.0). Electronically Signed   By: Virgina Norfolk M.D.   On: 07/22/2021 01:06     EKG None  Radiology CT Renal Stone Study  Result Date: 07/22/2021 CLINICAL DATA:  Gross hematuria. EXAM: CT ABDOMEN AND PELVIS WITHOUT CONTRAST TECHNIQUE: Multidetector CT imaging of the abdomen and pelvis was performed following the standard protocol without IV contrast. RADIATION DOSE REDUCTION: This exam was performed according to the departmental dose-optimization program which includes automated exposure control, adjustment of the mA and/or kV according to patient size and/or use of iterative reconstruction technique. COMPARISON:  March 29, 2015 FINDINGS: Lower chest: No acute abnormality. Hepatobiliary: No focal liver abnormality is seen. Status post cholecystectomy. No biliary dilatation. Pancreas: Unremarkable. No pancreatic ductal dilatation or surrounding inflammatory changes. Spleen: Normal in size without focal abnormality. Adrenals/Urinary Tract: Adrenal glands are unremarkable. The kidneys are normal, without renal calculi or focal lesions. There is mild bilateral hydronephrosis and hydroureter. A Foley catheter is seen within the urinary bladder. Its insufflator bulb is surrounded by a moderate amount of mildly hyperdense material (approximate 50.84 Hounsfield units). Stomach/Bowel: Stomach is within normal limits. Appendix appears normal. No evidence of bowel wall thickening,  distention, or inflammatory changes. Noninflamed diverticula are seen within the descending and sigmoid colon. Vascular/Lymphatic: Aortic atherosclerosis. No enlarged abdominal or pelvic lymph nodes. Reproductive: There is moderate to marked severity prostate gland enlargement. Other: No abdominal wall hernia or abnormality. No abdominopelvic ascites. Musculoskeletal: Marked severity degenerative changes are seen at the levels of L4-L5 and L5-S1. IMPRESSION: 1. Foley catheter within the urinary bladder insufflator bulb is surrounded by a moderate amount of blood products. Sequelae associated with an underlying hemorrhagic neoplasm cannot be excluded. 2. Moderate to marked severity prostatomegaly. 3. Colonic diverticulosis. 4. Marked severity degenerative changes at the levels of L4-L5 and L5-S1. 5. Aortic atherosclerosis. Aortic Atherosclerosis (ICD10-I70.0). Electronically Signed   By: Virgina Norfolk M.D.   On: 07/22/2021 01:06    Procedures Procedures    Medications Ordered in ED Medications  fentaNYL (SUBLIMAZE) injection 25 mcg (25 mcg Intravenous Given 07/22/21 0030)  cefTRIAXone (ROCEPHIN) 1 g in sodium chloride 0.9 % 100 mL IVPB (0 g Intravenous Stopped 07/22/21 0257)  sodium chloride 0.9 % bolus 500 mL (0 mLs Intravenous Stopped 07/22/21 0229)  sodium chloride 0.9 % bolus 500 mL (500 mLs Intravenous New Bag/Given 07/22/21 0252)    ED Course/ Medical Decision Making/ A&P                           Medical Decision Making Gross hematuria for 4 days.  Has an indwelling foley that was changed  yesterday   Problems Addressed: Gross hematuria:    Details: likely bladder lesion will need to follow up with urology for ongoing care.  H/H not changed Lesion of bladder:    Details: follow up with urology  Amount and/or Complexity of Data Reviewed Independent Historian:     Details: daughter see above External Data Reviewed: notes.    Details: previous notes Labs: ordered.    Details: all labs  reviewed: normal white count and unchange hemoglobin 14.  Normal platelet count Urine is consistent with gross blood. Normal potassium and creatinine on chemistry panel Radiology: ordered and independent interpretation performed.    Details: no stone on CT by me Discussion of management or test interpretation with external provider(s): Case d/w Dr. Matilde Sprang. Treat for UTI.  Follow up in the office.    Risk Prescription drug management. Risk Details: Hemoglobin is stable.  Well appearing.  Patient will need cystoscopy and further care by urology.  Treated for UTI.  Call [facility] in the am for be seen.  Strict return precautions given     Final Clinical Impression(s) / ED Diagnoses Final diagnoses:  Gross hematuria  Lesion of bladder  Return for intractable cough, coughing up blood, fevers > 100.4 unrelieved by medication, shortness of breath, intractable vomiting, chest pain, shortness of breath, weakness, numbness, changes in speech, facial asymmetry, abdominal pain, passing out, Inability to tolerate liquids or food, cough, altered mental status or any concerns. No signs of systemic illness or infection. The patient is nontoxic-appearing on exam and vital signs are within normal limits.  I have reviewed the triage vital signs and the nursing notes. Pertinent labs & imaging results that were available during my care of the patient were reviewed by me and considered in my medical decision making (see chart for details). After history, exam, and medical workup I feel the patient has been appropriately medically screened and is safe for discharge home. Pertinent diagnoses were discussed with the patient. Patient was given return precautions.  Rx / DC Orders ED Discharge Orders          Ordered    cephALEXin (KEFLEX) 500 MG capsule  3 times daily        07/22/21 0324              Rayhaan Huster, MD 07/22/21 1761

## 2021-07-22 NOTE — ED Notes (Signed)
Bladder scan completed. Avg is 200cc

## 2021-07-25 ENCOUNTER — Other Ambulatory Visit: Payer: Self-pay | Admitting: Urology

## 2021-07-25 DIAGNOSIS — R339 Retention of urine, unspecified: Secondary | ICD-10-CM

## 2021-07-29 ENCOUNTER — Ambulatory Visit
Admission: RE | Admit: 2021-07-29 | Discharge: 2021-07-29 | Disposition: A | Discharge status: Home / Self Care | Payer: Medicare Other | Source: Ambulatory Visit | Attending: Urology | Admitting: Urology

## 2021-07-29 ENCOUNTER — Other Ambulatory Visit (HOSPITAL_COMMUNITY): Payer: Self-pay | Admitting: Interventional Radiology

## 2021-07-29 DIAGNOSIS — N401 Enlarged prostate with lower urinary tract symptoms: Secondary | ICD-10-CM

## 2021-07-29 DIAGNOSIS — R339 Retention of urine, unspecified: Secondary | ICD-10-CM

## 2021-07-29 DIAGNOSIS — R310 Gross hematuria: Secondary | ICD-10-CM

## 2021-07-29 NOTE — Consult Note (Signed)
Chief Complaint: Patient was seen in consultation today for LUTS, BPH, gross hematuria at the request of Ricky,Eugene D Bradley  Referring Physician(s): Ricky,Eugene D Bradley  History of Present Illness: Ricky Bradley is a 88 y.o. male with a documented history of  hematuria and BPH going back greater than 10 years.  This has progressed recently such that the patient has required permanent bladder catheterization since October 2022.  Cystoscopy at that time was reportedly negative.  The catheter is changed monthly.  He had a fairly severe urinary tract infection December 2022 with prolonged recovery resulting in weight loss of about 20 pounds and some persistent low energy.  He had recurrence of fairly severe gross hematuria earlier this month which led to ED visit, ultimately responded to bladder irrigation and Foley catheter upsizing to 22 [place].  He currently experiences no hematuria.  He was offered TURP for symptom control and to allow removal of the Foley catheter, but was reluctant to proceed due to concerns about anesthesia long-term complications.  He presents today for consultation regarding   prostate artery embolization and/or suprapubic catheter placement for symptom control. IPSS Questionnaire (AUA-7): (Based on month before permanent catheter placement) Over the past month.   1)  How often have you had a sensation of not emptying your bladder completely after you finish urinating?  1 - Less than 1 time in 5  2)  How often have you had to urinate again less than two hours after you finished urinating? 5 - Almost always  3)  How often have you found you stopped and started again several times when you urinated?  0 - Not at all  4) How difficult have you found it to postpone urination?  5 - Almost always  5) How often have you had a weak urinary stream?  0 - Not at all  6) How often have you had to push or strain to begin urination?  0 - Not at all  7) How many times did you most  typically get up to urinate from the time you went to bed until the time you got up in the morning?  4 - 4 times  Total score:  0-7 mildly symptomatic  15 8-19 moderately symptomatic   20-35 severely symptomatic  Most recent elevated PSA documented 5.25 on   04/29/2013  Past Medical History:  Diagnosis Date   Enlarged prostate    GERD (gastroesophageal reflux disease)    High cholesterol    High cholesterol    Hypertension    no longer takes bp med-resolved.   RBBB     Past Surgical History:  Procedure Laterality Date   ANTERIOR CERVICAL DECOMP/DISCECTOMY FUSION  01/16/2011   Procedure: ANTERIOR CERVICAL DECOMPRESSION/DISCECTOMY FUSION 1 LEVEL;  Surgeon: Ophelia Charter;  Location: [facility];  Service: Neurosurgery;  Laterality: N/A;  Cervical six-seven  Anterior Cervical Decomp[ression Fusion   ANTERIOR CERVICAL DECOMP/DISCECTOMY FUSION  01/23/2011   Procedure: ANTERIOR CERVICAL DECOMPRESSION/DISCECTOMY FUSION 1 LEVEL/HARDWARE REMOVAL;  Surgeon: Ophelia Charter;  Location: [facility];  Service: Neurosurgery;  Laterality: N/A;  Evacuation of Cervical Six-Seven Hematoma   BACK SURGERY     CHOLECYSTECTOMY  11/01/10   EYE SURGERY  2008   JOINT REPLACEMENT  2001  and 2002   both knees   laminectomy  08/2010   POSTERIOR CERVICAL FUSION/FORAMINOTOMY  11/06/2011   Procedure: POSTERIOR CERVICAL FUSION/FORAMINOTOMY LEVEL 1;  Surgeon: Ophelia Charter, MD;  Location: [facility];  Service: Neurosurgery;  Laterality: N/A;  C67 laminectomy with posterior cervical fusion with instrumentation   REPLACEMENT TOTAL KNEE      Allergies: Vicodin [hydrocodone-acetaminophen]  causes itching  Medications: Prior to Admission medications   Medication Sig Start Date End Date Taking? Authorizing Provider  amLODipine (NORVASC) 5 MG tablet Take 5 mg by mouth every evening. 09/20/20   [provider]  aspirin 81 MG EC tablet Take 81 mg by mouth daily.     [provider]   atorvastatin (LIPITOR) 10 MG tablet Take 10 mg by mouth daily. 01/07/21   [provider]  benazepril (LOTENSIN) 10 MG tablet Take 10 mg by mouth daily. 12/18/20   [provider]  cephALEXin (KEFLEX) 500 MG capsule Take 1 capsule (500 mg total) by mouth 3 (three) times daily. 07/22/21   Palumbo, April, MD  cetirizine (ZYRTEC) 10 MG tablet Take 10 mg by mouth at bedtime. 11/17/20   [provider]  citalopram (CELEXA) 20 MG tablet Take 20 mg by mouth daily.    [provider]  Docusate Calcium (STOOL SOFTENER PO) Take 1 capsule by mouth at bedtime.    [provider]  doxycycline (VIBRA-TABS) 100 MG tablet Take 100 mg by mouth 2 (two) times daily. 07/15/21   [provider]  finasteride (PROSCAR) 5 MG tablet Take 5 mg by mouth daily. 02/10/21   [provider]  meloxicam (MOBIC) 7.5 MG tablet Take 7.5 mg by mouth daily. 02/24/21   [provider]  NON FORMULARY Take 1 tablet by mouth daily. insta flex    [provider]  Olopatadine HCl 0.2 % SOLN Place 1 drop into both eyes daily as needed (for allergies). 11/26/18   [provider]  polyethylene glycol (MIRALAX / GLYCOLAX) 17 g packet Take 17 g by mouth daily.    [provider]  Tamsulosin HCl (FLOMAX) 0.4 MG CAPS Take 0.4 mg by mouth 2 (two) times daily.    [provider]  traZODone (DESYREL) 50 MG tablet Take 75 mg by mouth at bedtime. 07/12/21   [provider]     No family history on file.  Social History   Socioeconomic History   Marital status: Married    Spouse name: Not on file   Number of children: Not on file   Years of education: Not on file   Highest education level: Not on file  Occupational History   Not on file  Tobacco Use   Smoking status: Former    Packs/day: 0.50    Years: 2.00    Total pack years: 1.00    Types: Cigarettes    Quit date: 02/14/1971    Years since quitting: 50.4   Smokeless tobacco:  Never  Vaping Use   Vaping Use: Never used  Substance and Sexual Activity   Alcohol use: No   Drug use: No   Sexual activity: Never    Birth control/protection: None  Other Topics Concern   Not on file  Social History Narrative   Not on file   Social Determinants of Health   Financial Resource Strain: Not on file  Food Insecurity: Not on file  Transportation Needs: Not on file  Physical Activity: Not on file  Stress: Not on file  Social Connections: Not on file    ECOG Status: 1 - Symptomatic but completely ambulatory  Review of Systems: A 12 point ROS discussed and pertinent positives are indicated in the HPI above.  All other systems  are negative.  Review of Systems  Vital Signs: There were no vitals taken for this visit.  Advance Care Plan: The advanced care plan/surrogate decision maker was discussed at the time of visit and the patient did not wish to discuss or was not able to name a surrogate decision maker or provide an advance care plan.    Physical Exam Constitutional: Oriented to person, place, and time. Well-developed and well-nourished. No distress.   HENT:  Head: Normocephalic and atraumatic.  Eyes: Conjunctivae and EOM are normal. Right eye exhibits no discharge. Left eye exhibits no discharge. No scleral icterus.  Neck: No JVD present.  Pulmonary/Chest: Effort normal. No stridor. No respiratory distress.  Abdomen: soft, non distended Neurological:  alert and oriented to person, place, and time.  Skin: Skin is warm and dry.  not diaphoretic.  Psychiatric:   normal mood and affect.   behavior is normal. Judgment and thought content normal.   Mallampati Score: Not assessed    Imaging: CT Renal Stone Study  Result Date: 07/22/2021 CLINICAL DATA:  Gross hematuria. EXAM: CT ABDOMEN AND PELVIS WITHOUT CONTRAST TECHNIQUE: Multidetector CT imaging of the abdomen and pelvis was performed following the standard protocol without IV contrast. RADIATION DOSE  REDUCTION: This exam was performed according to the departmental dose-optimization program which includes automated exposure control, adjustment of the mA and/or kV according to patient size and/or use of iterative reconstruction technique. COMPARISON:  March 29, 2015 FINDINGS: Lower chest: No acute abnormality. Hepatobiliary: No focal liver abnormality is seen. Status post cholecystectomy. No biliary dilatation. Pancreas: Unremarkable. No pancreatic ductal dilatation or surrounding inflammatory changes. Spleen: Normal in size without focal abnormality. Adrenals/Urinary Tract: Adrenal glands are unremarkable. The kidneys are normal, without renal calculi or focal lesions. There is mild bilateral hydronephrosis and hydroureter. A Foley catheter is seen within the urinary bladder. Its insufflator bulb is surrounded by a moderate amount of mildly hyperdense material (approximate 50.84 Hounsfield units). Stomach/Bowel: Stomach is within normal limits. Appendix appears normal. No evidence of bowel wall thickening, distention, or inflammatory changes. Noninflamed diverticula are seen within the descending and sigmoid colon. Vascular/Lymphatic: Aortic atherosclerosis. No enlarged abdominal or pelvic lymph nodes. Reproductive: There is moderate to marked severity prostate gland enlargement. Other: No abdominal wall hernia or abnormality. No abdominopelvic ascites. Musculoskeletal: Marked severity degenerative changes are seen at the levels of L4-L5 and L5-S1. IMPRESSION: 1. Foley catheter within the urinary bladder insufflator bulb is surrounded by a moderate amount of blood products. Sequelae associated with an underlying hemorrhagic neoplasm cannot be excluded. 2. Moderate to marked severity prostatomegaly. 3. Colonic diverticulosis. 4. Marked severity degenerative changes at the levels of L4-L5 and L5-S1. 5. Aortic atherosclerosis. Aortic Atherosclerosis (ICD10-I70.0). Electronically Signed   By: Virgina Norfolk  M.D.   On: 07/22/2021 01:06     Estimated prostate volume based on CT measurements 81 cc. Labs:  CBC: Recent Labs    07/22/21 0000 07/22/21 0015  WBC 7.7  --   HGB 14.0 13.6  HCT 40.3 40.0  PLT 176  --     COAGS: Recent Labs    07/22/21 0001  INR 1.0    BMP: Recent Labs    07/22/21 0000 07/22/21 0015  NA 132* 130*  K 4.0 4.1  CL 100 98  CO2 23  --   GLUCOSE 108* 108*  BUN 22 22  CALCIUM 9.2  --   CREATININE 0.97 1.00  GFRNONAA >60  --     LIVER FUNCTION TESTS: Recent Labs  07/22/21 0000  BILITOT 0.8  AST 15  ALT 15  ALKPHOS 73  PROT 6.4*  ALBUMIN 3.7    TUMOR MARKERS: No results for input(s): "AFPTM", "CEA", "CA199", "CHROMGRNA" in the last 8760 hours.  Assessment and Plan:  My impression is that this pleasant gentleman with BPH and lower urinary tract symptoms with intermittent gross hematuria would be a candidate for prostate artery embolization for improvement of his lower urinary tract symptoms.  I would be inclined to delay the suprapubic catheter placement until voiding trial  after PAE.  I discussed with the patient and his daughter in detail the technique of percutaneous prostate artery embolization, anticipated benefits, time course to symptom resolution, possible risks and complications (including vessel damage, bleeding, nontarget embolization), and alternatives (watchful waiting, TURP, UroLift, etc.).  We also discussed suprapubic urinary bladder catheter placement technique under CT guidance, possible risks and complications.  They seemed to understand, and asked appropriate questions which were answered.  The patient is motivated to proceed.  Therefore, we will go ahead and get a CTA abd pelvis to assess major vascular patency since there is moderate atheromatous plaque evident on the noncontrast study from earlier this month.  Subsequently, assuming there is reasonable percutaneous access available, we can set him up for outpatient  prostate artery embolization at [facility] under moderate sedation.  Thank you for this interesting consult.  I greatly enjoyed meeting ADDISON WHIDBEE and look forward to participating in their care.  A copy of this report was sent to the requesting provider on this date.  Electronically Signed: Rickard Rhymes 07/29/2021, 3:39 PM   I spent a total of  30 Minutes   in face to face in clinical consultation, greater than 50% of which was counseling/coordinating care for symptomatic BPH with LUTS and gross hematuria.

## 2021-08-01 ENCOUNTER — Other Ambulatory Visit: Payer: Self-pay | Admitting: Interventional Radiology

## 2021-08-01 DIAGNOSIS — R339 Retention of urine, unspecified: Secondary | ICD-10-CM

## 2021-08-05 ENCOUNTER — Ambulatory Visit (HOSPITAL_COMMUNITY)
Admission: RE | Admit: 2021-08-05 | Discharge: 2021-08-05 | Disposition: A | Discharge status: Home / Self Care | Payer: Medicare Other | Source: Ambulatory Visit | Attending: Interventional Radiology | Admitting: Interventional Radiology

## 2021-08-05 DIAGNOSIS — R310 Gross hematuria: Secondary | ICD-10-CM

## 2021-08-05 DIAGNOSIS — R3914 Feeling of incomplete bladder emptying: Secondary | ICD-10-CM | POA: Insufficient documentation

## 2021-08-05 DIAGNOSIS — N401 Enlarged prostate with lower urinary tract symptoms: Secondary | ICD-10-CM | POA: Diagnosis present

## 2021-08-05 MED ORDER — IOHEXOL 350 MG/ML SOLN
100.0000 mL | Freq: Once | INTRAVENOUS | Status: AC | PRN
  Administered 2021-08-05: 100 mL via INTRAVENOUS

## 2021-08-08 ENCOUNTER — Other Ambulatory Visit (HOSPITAL_COMMUNITY): Payer: Self-pay | Admitting: Interventional Radiology

## 2021-08-08 DIAGNOSIS — N401 Enlarged prostate with lower urinary tract symptoms: Secondary | ICD-10-CM

## 2021-08-12 ENCOUNTER — Other Ambulatory Visit: Payer: Self-pay | Admitting: Student

## 2021-08-12 DIAGNOSIS — N401 Enlarged prostate with lower urinary tract symptoms: Secondary | ICD-10-CM

## 2021-08-19 ENCOUNTER — Encounter (HOSPITAL_COMMUNITY): Payer: Self-pay

## 2021-08-19 ENCOUNTER — Other Ambulatory Visit: Payer: Self-pay

## 2021-08-19 ENCOUNTER — Observation Stay (HOSPITAL_COMMUNITY)
Admission: EM | Admit: 2021-08-19 | Discharge: 2021-08-22 | Disposition: A | Discharge status: Home Health | Payer: Medicare Other | Attending: Family Medicine | Admitting: Family Medicine

## 2021-08-19 DIAGNOSIS — Z79899 Other long term (current) drug therapy: Secondary | ICD-10-CM | POA: Insufficient documentation

## 2021-08-19 DIAGNOSIS — M48062 Spinal stenosis, lumbar region with neurogenic claudication: Principal | ICD-10-CM | POA: Diagnosis present

## 2021-08-19 DIAGNOSIS — R531 Weakness: Secondary | ICD-10-CM | POA: Diagnosis not present

## 2021-08-19 DIAGNOSIS — N3000 Acute cystitis without hematuria: Secondary | ICD-10-CM | POA: Diagnosis not present

## 2021-08-19 DIAGNOSIS — R269 Unspecified abnormalities of gait and mobility: Secondary | ICD-10-CM | POA: Insufficient documentation

## 2021-08-19 DIAGNOSIS — R252 Cramp and spasm: Secondary | ICD-10-CM | POA: Diagnosis present

## 2021-08-19 DIAGNOSIS — Z96653 Presence of artificial knee joint, bilateral: Secondary | ICD-10-CM | POA: Diagnosis not present

## 2021-08-19 DIAGNOSIS — R200 Anesthesia of skin: Secondary | ICD-10-CM

## 2021-08-19 DIAGNOSIS — Z87891 Personal history of nicotine dependence: Secondary | ICD-10-CM | POA: Diagnosis not present

## 2021-08-19 DIAGNOSIS — Z87898 Personal history of other specified conditions: Secondary | ICD-10-CM

## 2021-08-19 DIAGNOSIS — I10 Essential (primary) hypertension: Secondary | ICD-10-CM | POA: Diagnosis present

## 2021-08-19 DIAGNOSIS — Z7982 Long term (current) use of aspirin: Secondary | ICD-10-CM | POA: Diagnosis not present

## 2021-08-19 DIAGNOSIS — Z978 Presence of other specified devices: Secondary | ICD-10-CM

## 2021-08-19 DIAGNOSIS — E785 Hyperlipidemia, unspecified: Secondary | ICD-10-CM | POA: Diagnosis present

## 2021-08-19 DIAGNOSIS — N400 Enlarged prostate without lower urinary tract symptoms: Secondary | ICD-10-CM | POA: Diagnosis present

## 2021-08-19 DIAGNOSIS — F419 Anxiety disorder, unspecified: Secondary | ICD-10-CM

## 2021-08-19 DIAGNOSIS — N390 Urinary tract infection, site not specified: Secondary | ICD-10-CM | POA: Diagnosis present

## 2021-08-19 NOTE — ED Triage Notes (Signed)
BIB [facility] from home with c/o leg spasm in BLE. Hx of same. States he feels like it is burning at the bottom of his foot. Relief with pressure to leg.

## 2021-08-20 ENCOUNTER — Encounter (HOSPITAL_COMMUNITY): Payer: Self-pay | Admitting: Emergency Medicine

## 2021-08-20 ENCOUNTER — Emergency Department (HOSPITAL_COMMUNITY): Payer: Medicare Other

## 2021-08-20 DIAGNOSIS — F419 Anxiety disorder, unspecified: Secondary | ICD-10-CM

## 2021-08-20 DIAGNOSIS — T83511A Infection and inflammatory reaction due to indwelling urethral catheter, initial encounter: Secondary | ICD-10-CM

## 2021-08-20 DIAGNOSIS — N390 Urinary tract infection, site not specified: Secondary | ICD-10-CM | POA: Diagnosis present

## 2021-08-20 DIAGNOSIS — Z978 Presence of other specified devices: Secondary | ICD-10-CM

## 2021-08-20 DIAGNOSIS — Z87898 Personal history of other specified conditions: Secondary | ICD-10-CM

## 2021-08-20 LAB — COMPREHENSIVE METABOLIC PANEL
ALT: 17 U/L (ref 0–44)
AST: 19 U/L (ref 15–41)
Albumin: 4 g/dL (ref 3.5–5.0)
Alkaline Phosphatase: 75 U/L (ref 38–126)
Anion gap: 7 (ref 5–15)
BUN: 20 mg/dL (ref 8–23)
CO2: 23 mmol/L (ref 22–32)
Calcium: 9.4 mg/dL (ref 8.9–10.3)
Chloride: 107 mmol/L (ref 98–111)
Creatinine, Ser: 1.04 mg/dL (ref 0.61–1.24)
GFR, Estimated: >60 mL/min (ref >60)
Glucose, Bld: 105 mg/dL — ABNORMAL HIGH (ref 70–99)
Potassium: 4 mmol/L (ref 3.5–5.1)
Sodium: 137 mmol/L (ref 135–145)
Total Bilirubin: 0.5 mg/dL (ref 0.3–1.2)
Total Protein: 7 g/dL (ref 6.5–8.1)

## 2021-08-20 LAB — CBC WITH DIFFERENTIAL/PLATELET
Abs Immature Granulocytes: 0.04 10*3/uL (ref 0.00–0.07)
Basophils Absolute: 0.1 10*3/uL (ref 0.0–0.1)
Basophils Relative: 1 %
Eosinophils Absolute: 0.2 10*3/uL (ref 0.0–0.5)
Eosinophils Relative: 2 %
HCT: 42.1 % (ref 39.0–52.0)
Hemoglobin: 14.5 g/dL (ref 13.0–17.0)
Immature Granulocytes: 1 %
Lymphocytes Relative: 25 %
Lymphs Abs: 2 10*3/uL (ref 0.7–4.0)
MCH: 30.4 pg (ref 26.0–34.0)
MCHC: 34.4 g/dL (ref 30.0–36.0)
MCV: 88.3 fL (ref 80.0–100.0)
Monocytes Absolute: 0.8 10*3/uL (ref 0.1–1.0)
Monocytes Relative: 10 %
Neutro Abs: 5 10*3/uL (ref 1.7–7.7)
Neutrophils Relative %: 61 %
Platelets: 200 10*3/uL (ref 150–400)
RBC: 4.77 MIL/uL (ref 4.22–5.81)
RDW: 12.8 % (ref 11.5–15.5)
WBC: 8 10*3/uL (ref 4.0–10.5)
nRBC: 0 % (ref 0.0–0.2)

## 2021-08-20 LAB — URINALYSIS, ROUTINE W REFLEX MICROSCOPIC
Bilirubin Urine: NEGATIVE
Glucose, UA: NEGATIVE mg/dL
Ketones, ur: NEGATIVE mg/dL
Nitrite: POSITIVE — AB
Protein, ur: NEGATIVE mg/dL
Specific Gravity, Urine: 1.01 (ref 1.005–1.030)
WBC, UA: >50 WBC/hpf — ABNORMAL HIGH (ref 0–5)
pH: 7 (ref 5.0–8.0)

## 2021-08-20 LAB — LIPASE, BLOOD: Lipase: 40 U/L (ref 11–51)

## 2021-08-20 MED ORDER — BENAZEPRIL HCL 10 MG PO TABS
10.0000 mg | ORAL_TABLET | Freq: Every day | ORAL | Status: DC
  Administered 2021-08-21 – 2021-08-22 (×2): 10 mg via ORAL
  Filled 2021-08-20 (×3): qty 1

## 2021-08-20 MED ORDER — TRAZODONE HCL 50 MG PO TABS
75.0000 mg | ORAL_TABLET | Freq: Every day | ORAL | Status: DC
  Administered 2021-08-20 – 2021-08-21 (×2): 75 mg via ORAL
  Filled 2021-08-20 (×2): qty 2

## 2021-08-20 MED ORDER — ENOXAPARIN SODIUM 40 MG/0.4ML IJ SOSY
40.0000 mg | PREFILLED_SYRINGE | INTRAMUSCULAR | Status: DC
  Administered 2021-08-20: 40 mg via SUBCUTANEOUS
  Filled 2021-08-20: qty 0.4

## 2021-08-20 MED ORDER — LIDOCAINE HCL URETHRAL/MUCOSAL 2 % EX GEL
1.0000 | Freq: Once | CUTANEOUS | Status: AC
  Administered 2021-08-20: 1 via URETHRAL
  Filled 2021-08-20: qty 11

## 2021-08-20 MED ORDER — MELOXICAM 15 MG PO TABS
7.5000 mg | ORAL_TABLET | Freq: Every day | ORAL | Status: DC
  Administered 2021-08-20: 7.5 mg via ORAL
  Filled 2021-08-20: qty 1

## 2021-08-20 MED ORDER — FINASTERIDE 5 MG PO TABS
5.0000 mg | ORAL_TABLET | Freq: Every day | ORAL | Status: DC
  Administered 2021-08-21 – 2021-08-22 (×2): 5 mg via ORAL
  Filled 2021-08-20 (×3): qty 1

## 2021-08-20 MED ORDER — FENTANYL CITRATE PF 50 MCG/ML IJ SOSY
25.0000 ug | PREFILLED_SYRINGE | INTRAMUSCULAR | Status: DC | PRN
  Administered 2021-08-21: 25 ug via INTRAVENOUS
  Filled 2021-08-20: qty 1

## 2021-08-20 MED ORDER — SODIUM CHLORIDE 0.9 % IV SOLN: INTRAVENOUS | Status: DC

## 2021-08-20 MED ORDER — SODIUM CHLORIDE 0.9 % IV SOLN
2.0000 g | Freq: Once | INTRAVENOUS | Status: AC
Start: 2021-08-20 — End: 2021-08-20
  Administered 2021-08-20: 2 g via INTRAVENOUS
  Filled 2021-08-20: qty 12.5

## 2021-08-20 MED ORDER — ASPIRIN 81 MG PO TBEC
81.0000 mg | DELAYED_RELEASE_TABLET | Freq: Every day | ORAL | Status: DC
  Administered 2021-08-21 – 2021-08-22 (×2): 81 mg via ORAL
  Filled 2021-08-20 (×2): qty 1

## 2021-08-20 MED ORDER — FLUTICASONE PROPIONATE 50 MCG/ACT NA SUSP
2.0000 | Freq: Every day | NASAL | Status: DC
  Administered 2021-08-21 – 2021-08-22 (×2): 2 via NASAL
  Filled 2021-08-20: qty 16

## 2021-08-20 MED ORDER — LORATADINE 10 MG PO TABS
10.0000 mg | ORAL_TABLET | Freq: Every day | ORAL | Status: DC
  Administered 2021-08-21 – 2021-08-22 (×2): 10 mg via ORAL
  Filled 2021-08-20 (×2): qty 1

## 2021-08-20 MED ORDER — FENTANYL CITRATE PF 50 MCG/ML IJ SOSY
50.0000 ug | PREFILLED_SYRINGE | Freq: Once | INTRAMUSCULAR | Status: AC
  Administered 2021-08-20: 50 ug via INTRAVENOUS
  Filled 2021-08-20: qty 1

## 2021-08-20 MED ORDER — PROCHLORPERAZINE EDISYLATE 10 MG/2ML IJ SOLN: 10.0000 mg | Freq: Four times a day (QID) | INTRAMUSCULAR | Status: DC | PRN

## 2021-08-20 MED ORDER — TAMSULOSIN HCL 0.4 MG PO CAPS
0.4000 mg | ORAL_CAPSULE | Freq: Two times a day (BID) | ORAL | Status: DC
  Administered 2021-08-20 – 2021-08-22 (×4): 0.4 mg via ORAL
  Filled 2021-08-20 (×4): qty 1

## 2021-08-20 MED ORDER — AMLODIPINE BESYLATE 5 MG PO TABS
5.0000 mg | ORAL_TABLET | Freq: Every evening | ORAL | Status: DC
  Administered 2021-08-20 – 2021-08-21 (×2): 5 mg via ORAL
  Filled 2021-08-20 (×2): qty 1

## 2021-08-20 MED ORDER — DEXAMETHASONE SODIUM PHOSPHATE 10 MG/ML IJ SOLN
6.0000 mg | Freq: Once | INTRAMUSCULAR | Status: AC
  Administered 2021-08-20: 6 mg via INTRAVENOUS
  Filled 2021-08-20: qty 1

## 2021-08-20 MED ORDER — POLYETHYLENE GLYCOL 3350 17 G PO PACK
17.0000 g | PACK | Freq: Every day | ORAL | Status: DC
Start: 2021-08-20 — End: 2021-08-22
  Administered 2021-08-20 – 2021-08-21 (×2): 17 g via ORAL
  Filled 2021-08-20 (×2): qty 1

## 2021-08-20 MED ORDER — LORAZEPAM 2 MG/ML IJ SOLN
0.5000 mg | Freq: Once | INTRAMUSCULAR | Status: AC | PRN
  Administered 2021-08-20: 0.5 mg via INTRAVENOUS
  Filled 2021-08-20: qty 1

## 2021-08-20 MED ORDER — CITALOPRAM HYDROBROMIDE 20 MG PO TABS
20.0000 mg | ORAL_TABLET | Freq: Every day | ORAL | Status: DC
  Administered 2021-08-21 – 2021-08-22 (×2): 20 mg via ORAL
  Filled 2021-08-20 (×2): qty 1

## 2021-08-20 MED ORDER — SODIUM CHLORIDE 0.9 % IV SOLN
2.0000 g | INTRAVENOUS | Status: DC
  Administered 2021-08-20 – 2021-08-21 (×2): 2 g via INTRAVENOUS
  Filled 2021-08-20 (×2): qty 20

## 2021-08-20 MED ORDER — SODIUM CHLORIDE 0.9 % IV SOLN
1.0000 g | Freq: Once | INTRAVENOUS | Status: AC
  Administered 2021-08-20: 1 g via INTRAVENOUS
  Filled 2021-08-20: qty 10

## 2021-08-20 MED ORDER — OLOPATADINE HCL 0.1 % OP SOLN
1.0000 [drp] | Freq: Two times a day (BID) | OPHTHALMIC | Status: DC
  Filled 2021-08-20: qty 5

## 2021-08-20 NOTE — ED Provider Notes (Signed)
[facility] DEPT  Provider Note  CSN: [id number] Arrival date & time: 08/19/21 2245  History Chief Complaint  Patient presents with   Leg Spasm    Ricky Bradley is a 88 y.o. male with prior history of lumbar surgery in 5170 complicated by post-op hematoma, then a cervical bone spur causing upper extremity weakness, had cervical surgery as well. He has been able to walk and use his arms well although he has gone from using a cane to using a walker in recent months. He reports occasional leg jumping during the night but in the last 24 hours has had more significant pains and numbness in both legs, R>L, unable to stand due to the pain and maybe some weakness. He has a chronic indwelling foley, so unable to say if he has had incontinence. He does report some mid abdominal pain and constipation in recent days but does not think that is related. He denies any new falls or injuries. No fevers.   Home Medications Prior to Admission medications   Medication Sig Start Date End Date Taking? Authorizing Provider  amLODipine (NORVASC) 5 MG tablet Take 5 mg by mouth every evening. 09/20/20   [provider]  aspirin 81 MG EC tablet Take 81 mg by mouth daily.     [provider]  atorvastatin (LIPITOR) 10 MG tablet Take 10 mg by mouth daily. 01/07/21   [provider]  benazepril (LOTENSIN) 10 MG tablet Take 10 mg by mouth daily. 12/18/20   [provider]  cephALEXin (KEFLEX) 500 MG capsule Take 1 capsule (500 mg total) by mouth 3 (three) times daily. 07/22/21   Palumbo, April, MD  cetirizine (ZYRTEC) 10 MG tablet Take 10 mg by mouth at bedtime. 11/17/20   [provider]  citalopram (CELEXA) 20 MG tablet Take 20 mg by mouth daily.    [provider]  Docusate Calcium (STOOL SOFTENER PO) Take 1 capsule by mouth at bedtime.    [provider]  doxycycline (VIBRA-TABS) 100 MG tablet Take 100 mg by mouth 2 (two) times  daily. 07/15/21   [provider]  finasteride (PROSCAR) 5 MG tablet Take 5 mg by mouth daily. 02/10/21   [provider]  meloxicam (MOBIC) 7.5 MG tablet Take 7.5 mg by mouth daily. 02/24/21   [provider]  NON FORMULARY Take 1 tablet by mouth daily. insta flex    [provider]  Olopatadine HCl 0.2 % SOLN Place 1 drop into both eyes daily as needed (for allergies). 11/26/18   [provider]  polyethylene glycol (MIRALAX / GLYCOLAX) 17 g packet Take 17 g by mouth daily.    [provider]  Tamsulosin HCl (FLOMAX) 0.4 MG CAPS Take 0.4 mg by mouth 2 (two) times daily.    [provider]  traZODone (DESYREL) 50 MG tablet Take 75 mg by mouth at bedtime. 07/12/21   [provider]     Allergies    Vicodin [hydrocodone-acetaminophen]   Review of Systems   Review of Systems Please see HPI for pertinent positives and negatives  Physical Exam BP 139/74   Pulse 87   Temp 97.8 F (36.6 C)   Resp 14   Ht 6' (1.829 m)   Wt 81.6 kg   SpO2 96%   BMI 24.40 kg/m   Physical Exam Vitals and nursing note reviewed.  Constitutional:      Appearance: Normal appearance.  HENT:     Head: Normocephalic  and atraumatic.     Nose: Nose normal.     Mouth/Throat:     Mouth: Mucous membranes are moist.  Eyes:     Extraocular Movements: Extraocular movements intact.     Conjunctiva/sclera: Conjunctivae normal.  Cardiovascular:     Rate and Rhythm: Normal rate.     Pulses: Normal pulses.  Pulmonary:     Effort: Pulmonary effort is normal.     Breath sounds: Normal breath sounds.  Abdominal:     General: Abdomen is flat.     Palpations: Abdomen is soft.     Tenderness: There is no abdominal tenderness.  Musculoskeletal:        General: No swelling or tenderness. Normal range of motion.     Cervical back: Neck supple.  Skin:    General: Skin is warm and dry.  Neurological:     General: No focal deficit present.      Mental Status: He is alert and oriented to person, place, and time.     Cranial Nerves: No cranial nerve deficit.     Sensory: Sensory deficit (decreased sensation of BLE R>L) present.     Motor: No weakness (normal strenght of BLE and BUE).  Psychiatric:        Mood and Affect: Mood normal.     ED Results / Procedures / Treatments   EKG None  Procedures Procedures  Medications Ordered in the ED Medications  LORazepam (ATIVAN) injection 0.5 mg (has no administration in time range)  cefTRIAXone (ROCEPHIN) 1 g in sodium chloride 0.9 % 100 mL IVPB (1 g Intravenous New Bag/Given 08/20/21 0600)  fentaNYL (SUBLIMAZE) injection 50 mcg (50 mcg Intravenous Given 08/20/21 0357)    Initial Impression and Plan  Patient here with LE pain and numbness, concerning for neurogenic claudication similar to when he had his first lumbar surgery. He has good motor function. Pulses are equal, no soft tissue tenderness. He does not have a prior diagnosis of neuropathy. As for his abdominal pain, likely due to his reported constipation. His abd exam is benign. Will check labs and give pain medications for comfort. He will need an MRI but he is reluctant to go to [facility] because he cannot go back to waiting in a chair. He would like to stay here for an MRI to be done in the AM. I think this is reasonable.   ED Course   Clinical Course as of 08/20/21 0619  Sat Aug 20, 2021  0424 CBC is normal.  [CS]  5573 CMP and lipase are normal.  [CS]  0516 UA with signs of infection. His catheter is due for a change today. This may account for his lower abdominal pain. Will replace foley and begin Abx.  [CS]  [phone] Patient is uncomfortable with ED staff changing his foley due to prior difficulties. Will need follow up with Urology to change the catheter at a different time.  [CS]  0605 Care of the patient will be signed out at the change of shift pending MRI.  [CS]    Clinical Course User Index [CS] Truddie Hidden, MD      MDM Rules/Calculators/A&P Medical Decision Making Problems Addressed: Acute cystitis without hematuria: acute illness or injury Lower extremity numbness: acute illness or injury  Amount and/or Complexity of Data Reviewed Labs: ordered. Decision-making details documented in ED Course. Radiology: ordered.  Risk Prescription drug management. Parenteral controlled substances.    Final Clinical Impression(s) / ED Diagnoses Final diagnoses:  Acute cystitis without  hematuria  Lower extremity numbness    Rx / DC Orders ED Discharge Orders     None        Truddie Hidden, MD 08/20/21 [phone]

## 2021-08-20 NOTE — ED Notes (Signed)
Pt returned from MRI, pt in NAD at this time.  See chart for updated vitals.

## 2021-08-20 NOTE — H&P (Signed)
History and Physical    Patient: Ricky Bradley OQH:[id number] DOB: 1933/06/19 DOA: 08/19/2021 DOS: the patient was seen and examined on 08/20/2021 PCP: Aletha Halim., PA-C  Patient coming from: Home  Chief Complaint:  Chief Complaint  Patient presents with   Leg Spasm   HPI: Ricky Bradley is a 88 y.o. male with medical history significant of HLD, GERD, BPH. Presenting with weakness. Symptoms started 2 days ago with BLE tremors (R > L). Initially there was no pain. The tremors lasted about 20 - 30 minutes. He woke up the next morning and was able to get around with his walker. However, he felt weak. His tremors returned. He asked his son to come by and look at him. By that time he was having significant weakness and pain in his legs. He denies falls, lightheadedness, dizziness, syncopal episodes, N/V/D. When his symptoms did not improve, he became concerned and called for EMS. He denies any other aggravating or alleviating factors    Review of Systems: As mentioned in the history of present illness. All other systems reviewed and are negative. Past Medical History:  Diagnosis Date   Enlarged prostate    GERD (gastroesophageal reflux disease)    High cholesterol    High cholesterol    Hypertension    no longer takes bp med-resolved.   RBBB    Past Surgical History:  Procedure Laterality Date   ANTERIOR CERVICAL DECOMP/DISCECTOMY FUSION  01/16/2011   Procedure: ANTERIOR CERVICAL DECOMPRESSION/DISCECTOMY FUSION 1 LEVEL;  Surgeon: Ophelia Charter;  Location: [facility];  Service: Neurosurgery;  Laterality: N/A;  Cervical six-seven  Anterior Cervical Decomp[ression Fusion   ANTERIOR CERVICAL DECOMP/DISCECTOMY FUSION  01/23/2011   Procedure: ANTERIOR CERVICAL DECOMPRESSION/DISCECTOMY FUSION 1 LEVEL/HARDWARE REMOVAL;  Surgeon: Ophelia Charter;  Location: [facility];  Service: Neurosurgery;  Laterality: N/A;  Evacuation of Cervical Six-Seven Hematoma   BACK SURGERY     CHOLECYSTECTOMY   11/01/10   EYE SURGERY  2008   JOINT REPLACEMENT  2001  and 2002   both knees   laminectomy  08/2010   POSTERIOR CERVICAL FUSION/FORAMINOTOMY  11/06/2011   Procedure: POSTERIOR CERVICAL FUSION/FORAMINOTOMY LEVEL 1;  Surgeon: Ophelia Charter, MD;  Location: [facility];  Service: Neurosurgery;  Laterality: N/A;  C67 laminectomy with posterior cervical fusion with instrumentation   REPLACEMENT TOTAL KNEE     Social History:  reports that he quit smoking about 50 years ago. His smoking use included cigarettes. He has a 1.00 pack-year smoking history. He has never used smokeless tobacco. He reports that he does not drink alcohol and does not use drugs.  Allergies  Allergen Reactions   Vicodin [Hydrocodone-Acetaminophen] Hives    On neck and chest.    History reviewed. No pertinent family history.  Prior to Admission medications   Medication Sig Start Date End Date Taking? Authorizing Provider  amLODipine (NORVASC) 5 MG tablet Take 5 mg by mouth every evening. 09/20/20   [provider]  aspirin 81 MG EC tablet Take 81 mg by mouth daily.     [provider]  atorvastatin (LIPITOR) 10 MG tablet Take 10 mg by mouth daily. 01/07/21   [provider]  benazepril (LOTENSIN) 10 MG tablet Take 10 mg by mouth daily. 12/18/20   [provider]  cephALEXin (KEFLEX) 500 MG capsule Take 1 capsule (500 mg total) by mouth 3 (three) times daily. 07/22/21   Palumbo, April, MD  cetirizine (ZYRTEC) 10 MG tablet Take 10  mg by mouth at bedtime. 11/17/20   [provider]  citalopram (CELEXA) 20 MG tablet Take 20 mg by mouth daily.    [provider]  Docusate Calcium (STOOL SOFTENER PO) Take 1 capsule by mouth at bedtime.    [provider]  doxycycline (VIBRA-TABS) 100 MG tablet Take 100 mg by mouth 2 (two) times daily. 07/15/21   [provider]  finasteride (PROSCAR) 5 MG tablet Take 5 mg by mouth daily. 02/10/21   [provider]   meloxicam (MOBIC) 7.5 MG tablet Take 7.5 mg by mouth daily. 02/24/21   [provider]  NON FORMULARY Take 1 tablet by mouth daily. insta flex    [provider]  Olopatadine HCl 0.2 % SOLN Place 1 drop into both eyes daily as needed (for allergies). 11/26/18   [provider]  polyethylene glycol (MIRALAX / GLYCOLAX) 17 g packet Take 17 g by mouth daily.    [provider]  Tamsulosin HCl (FLOMAX) 0.4 MG CAPS Take 0.4 mg by mouth 2 (two) times daily.    [provider]  traZODone (DESYREL) 50 MG tablet Take 75 mg by mouth at bedtime. 07/12/21   [provider]    Physical Exam: Vitals:   08/20/21 1050 08/20/21 1100 08/20/21 1130 08/20/21 1221  BP:  120/70 133/66   Pulse: 80 78 77   Resp: '19 15 19   '$ Temp:    98.5 F (36.9 C)  TempSrc:    Rectal  SpO2: 94% 95% 95%   Weight:      Height:       General: 88 y.o. male resting in bed in NAD Eyes: PERRL, normal sclera ENMT: Nares patent w/o discharge, orophaynx clear, dentition normal, ears w/o discharge/lesions/ulcers Neck: Supple, trachea midline Cardiovascular: RRR, +S1, S2, no m/g/r, equal pulses throughout Respiratory: CTABL, no w/r/r, normal WOB GI: BS+, NDNT, no masses noted, no organomegaly noted MSK: No e/c/c Neuro: A&O x 3, no focal deficits Psyc: Appropriate interaction and affect, anxious but cooperative  Data Reviewed:  Na+  137 K+  4.0 CO2  23 Glucose  105 BUN  20 Scr  1.04 WBC  8.0 Hgb  14.5 Plt  200  MRI Lumbar spine 1. Focal advanced degeneration at L4-5 when compared to 2013. The spinal canal is patent after laminectomy but there is advanced bilateral foraminal impingement at this level. Right more than left subarticular recess stenosis with right-sided synovial cyst. 2. L5-S1 intervertebral ankylosis since prior.  Assessment and Plan: UTI     - place in obs, med-surg     - continue abx; but change to rocephin 2g     - follow Ucx     - fluids      - foley exchanged in ED  BLE weakness Lumbar stenosis     - MRI as above     - EDP spoke with neurosurgery on-call [name]); recommendation: no acute management at this time; patient can follow up outpt     - PT eval  BPH w/ chronic urinary retention and chronic foley     - foley exchanged     - continue home regimen  HTN     - continue home regimen  HLD     - let's check CK before resuming home regimen  Anxiety     - continue home regimen  Advance Care Planning:   Code Status: DNR  Consults: None  Family Communication: w/ daughter at bedside  Severity of Illness:  The appropriate patient status for this patient is OBSERVATION. Observation status is judged to be reasonable and necessary in order to provide the required intensity of service to ensure the patient's safety. The patient's presenting symptoms, physical exam findings, and initial radiographic and laboratory data in the context of their medical condition is felt to place them at decreased risk for further clinical deterioration. Furthermore, it is anticipated that the patient will be medically stable for discharge from the hospital within 2 midnights of admission.   Author: Jonnie Finner, DO 08/20/2021 1:18 PM  For on call review [email or web address].

## 2021-08-20 NOTE — ED Provider Notes (Signed)
Care of patient assumed from Dr. Karle Starch at 7 AM.  This patient has a remote history of lumbar surgery with postoperative complications.  He now presents with 24 hours of leg numbness and pain.  He is awaiting MRI.  Patient does have a indwelling Foley catheter.  He declined Foley change. Physical Exam  BP 139/74   Pulse 87   Temp 97.8 F (36.6 C)   Resp 14   Ht 6' (1.829 m)   Wt 81.6 kg   SpO2 96%   BMI 24.40 kg/m   Physical Exam Vitals and nursing note reviewed.  Constitutional:      General: He is not in acute distress.    Appearance: He is well-developed and normal weight. He is not toxic-appearing or diaphoretic.  HENT:     Head: Normocephalic and atraumatic.     Right Ear: External ear normal.     Left Ear: External ear normal.     Nose: Nose normal.     Mouth/Throat:     Mouth: Mucous membranes are moist.     Pharynx: Oropharynx is clear.  Eyes:     Extraocular Movements: Extraocular movements intact.     Conjunctiva/sclera: Conjunctivae normal.  Cardiovascular:     Rate and Rhythm: Normal rate and regular rhythm.     Heart sounds: No murmur heard. Pulmonary:     Effort: Pulmonary effort is normal. Tachypnea present. No respiratory distress.     Breath sounds: No wheezing or rales.  Abdominal:     Palpations: Abdomen is soft.     Tenderness: There is no abdominal tenderness.  Genitourinary:    Comments: Foley catheter in place, draining well Musculoskeletal:        General: No swelling. Normal range of motion.     Cervical back: Normal range of motion and neck supple.     Right lower leg: No edema.     Left lower leg: No edema.  Skin:    General: Skin is warm and dry.     Coloration: Skin is not jaundiced or pale.  Neurological:     General: No focal deficit present.     Mental Status: He is alert and oriented to person, place, and time.     Cranial Nerves: No cranial nerve deficit.     Sensory: Sensory deficit (Diminished sensation on lateral aspect of  distal right leg) present.     Motor: Weakness (Slight bilateral leg weakness, greater on the right) present.     Coordination: Coordination normal.  Psychiatric:        Mood and Affect: Mood normal.        Behavior: Behavior normal.        Thought Content: Thought content normal.        Judgment: Judgment normal.     Procedures  Procedures  ED Course / MDM   Clinical Course as of 08/20/21 0708  Sat Aug 20, 2021  0424 CBC is normal.  [CS]  8295 CMP and lipase are normal.  [CS]  0516 UA with signs of infection. His catheter is due for a change today. This may account for his lower abdominal pain. Will replace foley and begin Abx.  [CS]  [phone] Patient is uncomfortable with ED staff changing his foley due to prior difficulties. Will need follow up with Urology to change the catheter at a different time.  [CS]  [phone] Care of the patient will be signed out at the change of shift pending MRI.  [CS]  Clinical Course User Index [CS] Truddie Hidden, MD   Medical Decision Making Amount and/or Complexity of Data Reviewed Labs: ordered. Radiology: ordered.  Risk Prescription drug management. Decision regarding hospitalization.   On assessment, patient resting.  Vital signs notable for tachypnea.  He states that his pain is improved.  He has received 2 doses of fentanyl thus far.  On MRI study, he does have evidence of biforaminal stenosis with likely impingement at the level of L4-L5.  I discussed this with neurosurgeon on-call, Dr. [name].  She advises no acute management is indicated and that patient can follow-up as an outpatient.  When attempting to stand up, patient does appear to have some truncal weakness.  He is unable to stand unassisted.  His children are present at bedside and they report that this is an acute change.  His son lives upstairs from him.  Patient has reportedly been ambulating with a walker over the past several months but able to live independently.  This acute  change over the past 24 hours is traumatic and outpatient cannot even transfer without assistance.  Given that no acute findings are seen on MRI to explain this change, I suspect that it is related to his urine infection.  He has not had his Foley changed in the past 30 days.  He did receive ceftriaxone earlier this morning.  Given Foley placement, will give cefepime for pseudomonal coverage.  Plan will be for exchange of Foley catheter and admission for UTI weakness.  Patient was admitted to hospitalist for further management.       Godfrey Pick, MD 08/20/21 [phone]

## 2021-08-20 NOTE — ED Notes (Addendum)
Patient transported to MRI via stretcher w/ MRI Tech

## 2021-08-21 DIAGNOSIS — T83511A Infection and inflammatory reaction due to indwelling urethral catheter, initial encounter: Secondary | ICD-10-CM | POA: Diagnosis not present

## 2021-08-21 DIAGNOSIS — N390 Urinary tract infection, site not specified: Secondary | ICD-10-CM | POA: Diagnosis not present

## 2021-08-21 LAB — COMPREHENSIVE METABOLIC PANEL
ALT: 17 U/L (ref 0–44)
AST: 16 U/L (ref 15–41)
Albumin: 3.5 g/dL (ref 3.5–5.0)
Alkaline Phosphatase: 69 U/L (ref 38–126)
Anion gap: 7 (ref 5–15)
BUN: 18 mg/dL (ref 8–23)
CO2: 24 mmol/L (ref 22–32)
Calcium: 9 mg/dL (ref 8.9–10.3)
Chloride: 109 mmol/L (ref 98–111)
Creatinine, Ser: 0.83 mg/dL (ref 0.61–1.24)
GFR, Estimated: >60 mL/min (ref >60)
Glucose, Bld: 107 mg/dL — ABNORMAL HIGH (ref 70–99)
Potassium: 4 mmol/L (ref 3.5–5.1)
Sodium: 140 mmol/L (ref 135–145)
Total Bilirubin: 0.7 mg/dL (ref 0.3–1.2)
Total Protein: 6.4 g/dL — ABNORMAL LOW (ref 6.5–8.1)

## 2021-08-21 LAB — CBC
HCT: 41 % (ref 39.0–52.0)
Hemoglobin: 13.9 g/dL (ref 13.0–17.0)
MCH: 30.2 pg (ref 26.0–34.0)
MCHC: 33.9 g/dL (ref 30.0–36.0)
MCV: 88.9 fL (ref 80.0–100.0)
Platelets: 183 10*3/uL (ref 150–400)
RBC: 4.61 MIL/uL (ref 4.22–5.81)
RDW: 12.8 % (ref 11.5–15.5)
WBC: 9.6 10*3/uL (ref 4.0–10.5)
nRBC: 0 % (ref 0.0–0.2)

## 2021-08-21 LAB — CK: Total CK: 98 U/L (ref 49–397)

## 2021-08-21 MED ORDER — CHLORHEXIDINE GLUCONATE CLOTH 2 % EX PADS
6.0000 | MEDICATED_PAD | Freq: Every day | CUTANEOUS | Status: DC
Start: 2021-08-21 — End: 2021-08-22
  Administered 2021-08-22: 6 via TOPICAL

## 2021-08-21 MED ORDER — CYCLOBENZAPRINE HCL 5 MG PO TABS
5.0000 mg | ORAL_TABLET | Freq: Three times a day (TID) | ORAL | Status: DC
  Administered 2021-08-21 – 2021-08-22 (×3): 5 mg via ORAL
  Filled 2021-08-21 (×3): qty 1

## 2021-08-21 MED ORDER — OLOPATADINE HCL 0.1 % OP SOLN
1.0000 [drp] | Freq: Every day | OPHTHALMIC | Status: DC
  Administered 2021-08-21 – 2021-08-22 (×2): 1 [drp] via OPHTHALMIC
  Filled 2021-08-21: qty 5

## 2021-08-21 MED ORDER — OXYCODONE-ACETAMINOPHEN 5-325 MG PO TABS
1.0000 | ORAL_TABLET | Freq: Four times a day (QID) | ORAL | Status: DC | PRN
  Administered 2021-08-21 – 2021-08-22 (×2): 1 via ORAL
  Filled 2021-08-21 (×2): qty 1

## 2021-08-21 NOTE — Progress Notes (Addendum)
PROGRESS NOTE   Ricky Bradley  OIN:[id number] DOB: [birth year] DOA: 08/19/2021 PCP: Aletha Halim., PA-C  Brief Narrative:   88 year old white male Known L3-L4, L4-L5 laminectomy 08/2010 Subsequent C6-C7 decompressive laminectomy in 2013 Dr. Arnoldo Morale complicated by cervical epidural hematoma status postevacuation Prior lumbar stenosis with neurogenic claudication Prior lap chole 10/2010 Known BPH with chronic urinary retention with Foley  Came to ED 08/20/2021 numbness in both legs R >L-found to have biforaminal stenosis with impingement L4-L5 Previously has been able to live independently and transferring with walker MRI showed bilateral foraminal impingement at L4-L5 compared to 2013 in addition to L5-S1 intervertebral ankylosis    Hospital-Problem based course  Ambulatory dysfunction 2/2?  Bi-foraminal stenosis and L4-L5 impingement We will ask physical therapy to see and evaluate--patient is a candidate for surgery unclear if would be a candidate for epidural steroid injection Pain control Mobic 7.5 at bedtime-discontinue fentanyl placed on Percocet 1 tab 4 times daily ?  UTI Known BPH with chronic urinary retention/Foley--does see Dr. Gloriann Loan of urology in the outpatient setting and has been recommended against a TURP and was supposed to go for an interventional radiology procedure on July 21 Has been on antibiotics recently for about a month Foley was probably colonized-continue ceftriaxone for now 2 g every 24 Follow culture Continue finasteride 5 daily, Flomax 0.4 twice daily Hypertension Poorly controlled-continue amlodipine 5 mg, Lotensin 10 daily May need to add another class of agent if continues to be uncontrolled Depression Continue citalopram 20 daily, trazodone 50 at bedtime for sleep  DVT prophylaxis: SCD Code Status: DNR noted Family Communication: None present Disposition:  Status is: Observation The patient will require care spanning > 2 midnights and should be  moved to inpatient because:   Will need assistance with ambulation and consideration for intervention   Consultants:  None yet  Procedures: No  Antimicrobials: Ceftriaxone   Subjective: Could not walk on Friday lives alone No chest pain no fever States that he has occasional spasms in the lower extremities-no overt pain No chest pain no fever  Objective: Vitals:   08/21/21 0600 08/21/21 0700 08/21/21 0730 08/21/21 0737  BP: (!) 153/84 (!) 157/78 (!) 147/77   Pulse: 85 88 90   Resp: 13 (!) 22 (!) 22   Temp:  98 F (36.7 C)  (!) 97.3 F (36.3 C)  TempSrc:  Oral  Oral  SpO2: 97% 97% 97%   Weight:      Height:        Intake/Output Summary (Last 24 hours) at 08/21/2021 0801 Last data filed at 08/21/2021 0352 Gross per 24 hour  Intake --  Output 1400 ml  Net -1400 ml   Filed Weights   08/19/21 2253  Weight: 81.6 kg    Examination:  EOMI NCAT no focal deficit no icterus no pallor Neck soft supple Chest clear ROM intact moving all 4 limbs equally Power is 5/5 to lower extremities able to straight leg raise plantar and dorsiflexion are intact Reflexes at knees are attenuated because of prior knee surgeries Psych is euthymic  Data Reviewed: personally reviewed   CBC    Component Value Date/Time   WBC 9.6 08/21/2021 0500   RBC 4.61 08/21/2021 0500   HGB 13.9 08/21/2021 0500   HCT 41.0 08/21/2021 0500   PLT 183 08/21/2021 0500   MCV 88.9 08/21/2021 0500   MCH 30.2 08/21/2021 0500   MCHC 33.9 08/21/2021 0500   RDW 12.8 08/21/2021 0500   LYMPHSABS 2.0 08/20/2021 0410  MONOABS 0.8 08/20/2021 0410   EOSABS 0.2 08/20/2021 0410   BASOSABS 0.1 08/20/2021 0410      Latest Ref Rng & Units 08/21/2021    5:00 AM 08/20/2021    4:10 AM 07/22/2021   12:15 AM  CMP  Glucose 70 - 99 mg/dL 107  105  108   BUN 8 - 23 mg/dL '18  20  22   '$ Creatinine 0.61 - 1.24 mg/dL 0.83  1.04  1.00   Sodium 135 - 145 mmol/L 140  137  130   Potassium 3.5 - 5.1 mmol/L 4.0  4.0  4.1    Chloride 98 - 111 mmol/L 109  107  98   CO2 22 - 32 mmol/L 24  23    Calcium 8.9 - 10.3 mg/dL 9.0  9.4    Total Protein 6.5 - 8.1 g/dL 6.4  7.0    Total Bilirubin 0.3 - 1.2 mg/dL 0.7  0.5    Alkaline Phos 38 - 126 U/L 69  75    AST 15 - 41 U/L 16  19    ALT 0 - 44 U/L 17  17       Radiology Studies: MR LUMBAR SPINE WO CONTRAST  Result Date: 08/20/2021 CLINICAL DATA:  Lumbar radiculopathy with symptoms persisting over 6 weeks of treatment EXAM: MRI LUMBAR SPINE WITHOUT CONTRAST TECHNIQUE: Multiplanar, multisequence MR imaging of the lumbar spine was performed. No intravenous contrast was administered. COMPARISON:  10/03/2011 FINDINGS: Segmentation:  5 lumbar type vertebrae Alignment:  Degenerative straightening of lumbar lordosis. Vertebrae: No fracture, evidence of discitis, or bone lesion. Discogenic endplate edema at L8-9. Conus medullaris and cauda equina: Conus extends to the L1-2 level. Conus and cauda equina appear normal. Paraspinal and other soft tissues: Negative for perispinal mass or inflammation Disc levels: T12- L1: Mild facet spurring and disc bulging L1-L2: Mild disc bulging and facet spurring. Small bilateral foraminal protrusion L2-L3: Disc narrowing with mild bulging. Mild bilateral facet spurring L3-L4: Moderate to bulky degenerative facet spurring. Ventral spondylitic spurring. No neural compression L4-L5: Interval disc collapse with endplate degeneration and central protrusion. The disc is circumferentially bulging with endplate and facet spurring causing biforaminal impingement. The spinal canal is narrow but patent after laminectomy. Right more than left subarticular recess narrowing with 6 mm synovial cyst at the right subarticular recess L5-S1:Intervertebral ankylosis. Circumferential endplate ridging and mild facet spurring without neural compression. IMPRESSION: 1. Focal advanced degeneration at L4-5 when compared to 2013. The spinal canal is patent after laminectomy but  there is advanced bilateral foraminal impingement at this level. Right more than left subarticular recess stenosis with right-sided synovial cyst. 2. L5-S1 intervertebral ankylosis since prior. Electronically Signed   By: Jorje Guild M.D.   On: 08/20/2021 09:14     Scheduled Meds:  amLODipine  5 mg Oral QPM   aspirin EC  81 mg Oral Daily   benazepril  10 mg Oral Daily   citalopram  20 mg Oral Daily   enoxaparin (LOVENOX) injection  40 mg Subcutaneous Q24H   finasteride  5 mg Oral Daily   fluticasone  2 spray Each Nare Daily   loratadine  10 mg Oral Daily   meloxicam  7.5 mg Oral QHS   olopatadine  1 drop Both Eyes BID   polyethylene glycol  17 g Oral QHS   tamsulosin  0.4 mg Oral BID   traZODone  75 mg Oral QHS   Continuous Infusions:  sodium chloride 100 mL/hr at 08/20/21 2329  cefTRIAXone (ROCEPHIN)  IV Stopped (08/21/21 0016)     LOS: 0 days   Time spent: [place], MD [facility] To contact the attending provider between 7A-7P or the covering provider during after hours 7P-7A, please log into the web site [email or web address] and access using universal [place] password for that web site. If you do not have the password, please call the hospital operator.  08/21/2021, 8:01 AM

## 2021-08-21 NOTE — Evaluation (Signed)
Physical Therapy Evaluation Patient Details Name: Ricky Bradley MRN: [id number] DOB: 03/06/1933 Today's Date: 08/21/2021  History of Present Illness  Patient is a 88 year old male who presented with numbness in both legs R greater than L. 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 patient admitted with ambulatory disfunction, UTI, HTN and depression. PMH: BPH with chronic urinary retention, L3-L4, L4-L5 laminectomy 08/2010, C6-C7 decompressive laminectomy complicated by cervical epidural hematoma status postevacuation in 2013.  Clinical Impression  Pt admitted as above and presenting with functional mobility limitations 2* balance deficits and decreased endurance.  This date, pt up to ambulate ~250' with RW in halls and min guard assist to steady.  Pt reports feeling better since admit and hopes to regain IND and return to prior living arrangement soon.     Recommendations for follow up therapy are one component of a multi-disciplinary discharge planning process, led by the attending physician.  Recommendations may be updated based on patient status, additional functional criteria and insurance authorization.  Follow Up Recommendations No PT follow up      Assistance Recommended at Discharge Intermittent Supervision/Assistance  Patient can return home with the following  A little help with walking and/or transfers;A little help with bathing/dressing/bathroom;Assistance with cooking/housework;Assist for transportation;Help with stairs or ramp for entrance    Equipment Recommendations None recommended by PT  Recommendations for Other Services       Functional Status Assessment Patient has had a recent decline in their functional status and demonstrates the ability to make significant improvements in function in a reasonable and predictable amount of time.     Precautions / Restrictions Precautions Precaution  Comments: encourage back precautions, NEEDS shoes on when OOB Restrictions Weight Bearing Restrictions: No      Mobility  Bed Mobility Overal bed mobility: Needs Assistance Bed Mobility: Supine to Sit     Supine to sit: HOB elevated, Min guard     General bed mobility comments: with increased time    Transfers Overall transfer level: Needs assistance Equipment used: Rolling walker (2 wheels) Transfers: Sit to/from Stand Sit to Stand: Min guard           General transfer comment: steady assist only    Ambulation/Gait Ambulation/Gait assistance: Min guard Gait Distance (Feet): 250 Feet Assistive device: Rolling walker (2 wheels) Gait Pattern/deviations: Step-to pattern, Step-through pattern, Decreased step length - right, Decreased step length - left, Shuffle, Trunk flexed       General Gait Details: Steady assist with cues for posture and position from [employer]            Wheelchair Mobility    Modified Rankin (Stroke Patients Only)       Balance                                             Pertinent Vitals/Pain Pain Assessment Pain Assessment: No/denies pain    Home Living Family/patient expects to be discharged to:: Private residence Living Arrangements: Alone Available Help at Discharge: Family;Available 24 hours/day Type of Home: House Home Access: Stairs to enter Entrance Stairs-Rails: Right;Left;Can reach both Entrance Stairs-Number of Steps: 3-5   Home Layout: Two level;Able to live on main level with bedroom/bathroom Home Equipment: Tub bench;BSC/3in1;Rollator (4 wheels);Cane - single point;Grab bars - tub/shower      Prior Function Prior  Level of Function : Independent/Modified Independent               ADLs Comments: patients family checks on patient and assists with IADLs.     Hand Dominance   Dominant Hand: Right    Extremity/Trunk Assessment   Upper Extremity Assessment Upper Extremity  Assessment: Overall WFL for tasks assessed    Lower Extremity Assessment Lower Extremity Assessment: Overall WFL for tasks assessed    Cervical / Trunk Assessment Cervical / Trunk Assessment: Neck Surgery;Back Surgery  Communication   Communication: No difficulties  Cognition Arousal/Alertness: Awake/alert Behavior During Therapy: WFL for tasks assessed/performed Overall Cognitive Status: Within Functional Limits for tasks assessed                                 General Comments: patients son was present during session. patient was able to participate better with hearing aids in place        General Comments      Exercises     Assessment/Plan    PT Assessment Patient needs continued PT services  PT Problem List Decreased activity tolerance;Decreased balance;Decreased mobility;Decreased knowledge of use of DME       PT Treatment Interventions DME instruction;Stair training;Gait training;Functional mobility training;Therapeutic activities;Balance training;Therapeutic exercise;Patient/family education    PT Goals (Current goals can be found in the Care Plan section)  Acute Rehab PT Goals Patient Stated Goal: REgain IND PT Goal Formulation: With patient Time For Goal Achievement: 09/04/21 Potential to Achieve Goals: Good    Frequency Min 3X/week     Co-evaluation PT/OT/SLP Co-Evaluation/Treatment: Yes Reason for Co-Treatment: To address functional/ADL transfers PT goals addressed during session: Mobility/safety with mobility OT goals addressed during session: ADL's and self-care       AM-PAC PT "6 Clicks" Mobility  Outcome Measure Help needed turning from your back to your side while in a flat bed without using bedrails?: None Help needed moving from lying on your back to sitting on the side of a flat bed without using bedrails?: A Little Help needed moving to and from a bed to a chair (including a wheelchair)?: A Little Help needed standing up  from a chair using your arms (e.g., wheelchair or bedside chair)?: A Little Help needed to walk in hospital room?: A Little Help needed climbing 3-5 steps with a railing? : A Little 6 Click Score: 19    End of Session Equipment Utilized During Treatment: Gait belt Activity Tolerance: Patient tolerated treatment well Patient left: in chair;with call bell/phone within reach;with chair alarm set;with family/visitor present Nurse Communication: Mobility status PT Visit Diagnosis: Unsteadiness on feet (R26.81);Difficulty in walking, not elsewhere classified (R26.2)    Time: 0076-2263 PT Time Calculation (min) (ACUTE ONLY): 25 min   Charges:   PT Evaluation $PT Eval Low Complexity: 1 Low          Daisy Pager [phone] Office [phone]   Rahmon Heigl 08/21/2021, 3:44 PM

## 2021-08-21 NOTE — Plan of Care (Signed)
  Problem: Education: Goal: Knowledge of General Education information will improve Description Including pain rating scale, medication(s)/side effects and non-pharmacologic comfort measures Outcome: Progressing   

## 2021-08-21 NOTE — Evaluation (Signed)
Occupational Therapy Evaluation Patient Details Name: Ricky Bradley MRN: [id number] DOB: October 21, 1933 Today's Date: 08/21/2021   History of Present Illness Patient is a 88 year old male who presented with numbness in both legs R greater than L. 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 patient admitted with ambulatory disfunction, UTI, HTN and depression. PMH: BPH with chronic urinary retention, L3-L4, L4-L5 laminectomy 08/2010, C6-C7 decompressive laminectomy complicated by cervical epidural hematoma status postevacuation in 2013.   Clinical Impression   Patient is a 88 year old male who was admitted for above. Patient was noted to have decreased functional activity tolerance and decreased standing balance especially when shoes are not in place. Patients family report they are able to support patient with supervision but not physically in next level of care. Anticipate patient will be able to progress to level during acute stay to transition home with family support. Patient would continue to benefit from skilled OT services at this time while admitted and after d/c to address noted deficits in order to improve overall safety and independence in ADLs.        Recommendations for follow up therapy are one component of a multi-disciplinary discharge planning process, led by the attending physician.  Recommendations may be updated based on patient status, additional functional criteria and insurance authorization.   Follow Up Recommendations  Home health OT    Assistance Recommended at Discharge Frequent or constant Supervision/Assistance  Patient can return home with the following A little help with walking and/or transfers;A little help with bathing/dressing/bathroom;Assistance with cooking/housework;Direct supervision/assist for financial management;Assist for transportation;Direct supervision/assist for medications  management;Help with stairs or ramp for entrance    Functional Status Assessment  Patient has had a recent decline in their functional status and demonstrates the ability to make significant improvements in function in a reasonable and predictable amount of time.  Equipment Recommendations  None recommended by OT    Recommendations for Other Services       Precautions / Restrictions Precautions Precaution Comments: encourage back precautions, NEEDS shoes on when OOB Restrictions Weight Bearing Restrictions: No      Mobility Bed Mobility Overal bed mobility: Needs Assistance Bed Mobility: Supine to Sit     Supine to sit: HOB elevated, Min guard     General bed mobility comments: with increased time    Transfers                          Balance Overall balance assessment: Needs assistance Sitting-balance support: No upper extremity supported, Feet supported Sitting balance-Leahy Scale: Fair     Standing balance support: During functional activity, Reliant on assistive device for balance, Bilateral upper extremity supported Standing balance-Leahy Scale: Poor                             ADL either performed or assessed with clinical judgement   ADL Overall ADL's : Needs assistance/impaired Eating/Feeding: Set up;Sitting   Grooming: Set up;Sitting   Upper Body Bathing: Set up;Sitting   Lower Body Bathing: Minimal assistance;Sit to/from stand Lower Body Bathing Details (indicate cue type and reason): patient simulated taking hands off walker for tasks with unsteadiness noted. Upper Body Dressing : Set up;Sitting   Lower Body Dressing: Minimal assistance;Sit to/from stand   Toilet Transfer: Minimal assistance;Rolling walker (2 wheels) Toilet Transfer Details (indicate cue type and reason): simulated to recliner  Toileting- Clothing Manipulation and Hygiene: Minimal assistance;Sit to/from stand       Functional mobility during ADLs: Minimal  assistance;Rolling walker (2 wheels)       Vision Patient Visual Report: No change from baseline       Perception     Praxis      Pertinent Vitals/Pain Pain Assessment Pain Assessment: No/denies pain     Hand Dominance Right   Extremity/Trunk Assessment Upper Extremity Assessment Upper Extremity Assessment: Overall WFL for tasks assessed   Lower Extremity Assessment Lower Extremity Assessment: Defer to PT evaluation   Cervical / Trunk Assessment Cervical / Trunk Assessment: Neck Surgery;Back Surgery   Communication Communication Communication: No difficulties   Cognition Arousal/Alertness: Awake/alert Behavior During Therapy: WFL for tasks assessed/performed Overall Cognitive Status: Within Functional Limits for tasks assessed                                 General Comments: patients son was present during session. patient was abel to participate better with hearing aids in place     General Comments       Exercises     Shoulder Instructions      Home Living Family/patient expects to be discharged to:: Private residence Living Arrangements: Alone Available Help at Discharge: Family;Available 24 hours/day Type of Home: House Home Access: Stairs to enter [employer] of Steps: 3-4 Entrance Stairs-Rails: Right;Left;Can reach both Home Layout: Two level;Able to live on main level with bedroom/bathroom     Bathroom Shower/Tub: Tub/shower unit;Curtain         Home Equipment: Tub bench;BSC/3in1;Rollator (4 wheels);Cane - single point;Grab bars - tub/shower          Prior Functioning/Environment Prior Level of Function : Independent/Modified Independent               ADLs Comments: patients family checks on patient and assists with IADLs.        OT Problem List: Decreased activity tolerance;Impaired balance (sitting and/or standing);Decreased safety awareness;Decreased knowledge of precautions;Decreased knowledge of  use of DME or AE      OT Treatment/Interventions: Self-care/ADL training;Therapeutic exercise;Neuromuscular education;Energy conservation;DME and/or AE instruction;Therapeutic activities;Balance training;Patient/family education    OT Goals(Current goals can be found in the care plan section) Acute Rehab OT Goals Patient Stated Goal: to get back home OT Goal Formulation: With patient Time For Goal Achievement: 09/04/21 Potential to Achieve Goals: Good  OT Frequency: Min 2X/week    Co-evaluation PT/OT/SLP Co-Evaluation/Treatment: Yes Reason for Co-Treatment: To address functional/ADL transfers PT goals addressed during session: Mobility/safety with mobility OT goals addressed during session: ADL's and self-care      AM-PAC OT "6 Clicks" Daily Activity     Outcome Measure Help from another person eating meals?: None Help from another person taking care of personal grooming?: A Little Help from another person toileting, which includes using toliet, bedpan, or urinal?: A Little Help from another person bathing (including washing, rinsing, drying)?: A Little Help from another person to put on and taking off regular upper body clothing?: A Little Help from another person to put on and taking off regular lower body clothing?: A Little 6 Click Score: 19   End of Session Equipment Utilized During Treatment: Rolling walker (2 wheels);Gait belt Nurse Communication: Mobility status  Activity Tolerance: Patient tolerated treatment well Patient left: in chair;with call bell/phone within reach;with family/visitor present  OT Visit Diagnosis: Unsteadiness on feet (R26.81);Muscle weakness (generalized) (M62.81);Other  abnormalities of gait and mobility (R26.89)                Time: 1400-1425 OT Time Calculation (min): 25 min Charges:  OT General Charges $OT Visit: 1 Visit OT Evaluation $OT Eval Moderate Complexity: 1 Mod  Jackelyn Poling OTR/L, MS Acute Rehabilitation Department Office#  [phone] Pager# [phone]   Marcellina Millin 08/21/2021, 3:29 PM

## 2021-08-22 DIAGNOSIS — T83511A Infection and inflammatory reaction due to indwelling urethral catheter, initial encounter: Secondary | ICD-10-CM | POA: Diagnosis not present

## 2021-08-22 DIAGNOSIS — N390 Urinary tract infection, site not specified: Secondary | ICD-10-CM | POA: Diagnosis not present

## 2021-08-22 LAB — URINE CULTURE: Culture: >=100000 — AB

## 2021-08-22 MED ORDER — CYCLOBENZAPRINE HCL 5 MG PO TABS: 5.0000 mg | ORAL_TABLET | Freq: Three times a day (TID) | ORAL | 0 refills | Status: AC

## 2021-08-22 MED ORDER — OXYCODONE-ACETAMINOPHEN 5-325 MG PO TABS: 1.0000 | ORAL_TABLET | Freq: Four times a day (QID) | ORAL | 0 refills | Status: AC | PRN

## 2021-08-22 NOTE — Discharge Summary (Signed)
Physician Discharge Summary  Ricky Bradley:[id number] DOB: February 03, 1934 DOA: 08/19/2021  PCP: Aletha Halim., PA-C  Admit date: 08/19/2021 Discharge date: 08/22/2021  Time spent: 24 minutes  Recommendations for Outpatient Follow-up:  Needs Chem-12 CBC 1 week We will CC Dr. Gloriann Loan of urology with regards to coordination of his Foley catheter care and to ensure he is aware that patient was hospitalized-36 he is in the process of being referred to interventional radiology for procedure May need to escalate his antihypertensive regimen as outpatient New meds this admission,-cyclobenzaprine, oxycodone  Discharge Diagnoses:  MAIN problem for hospitalization   Ambulatory dysfunction secondary to L4-L5 foraminal stenosis  Please see below for itemized issues addressed in HOpsital- refer to other progress notes for clarity if needed  Discharge Condition: Improved  Diet recommendation: Heart healthy  Filed Weights   08/19/21 2253  Weight: 81.6 kg    History of present illness:  88 year old white male Known L3-L4, L4-L5 laminectomy 08/2010 Subsequent C6-C7 decompressive laminectomy in 2013 Dr. Arnoldo Morale complicated by cervical epidural hematoma status postevacuation Prior lumbar stenosis with neurogenic claudication Prior lap chole 10/2010 Known BPH with chronic urinary retention with Foley   Came to ED 08/20/2021 numbness in both legs R >L-found to have biforaminal stenosis with impingement L4-L5 Previously has been able to live independently and transferring with walker MRI showed bilateral foraminal impingement at L4-L5 compared to 2013 in addition to L5-S1 intervertebral ankylosis  Hospital Course:  Ambulatory dysfunction 2/2?  Bi-foraminal stenosis and L4-L5 impingement Pain control Mobic 7.5 at bedtime-discontinue fentanyl placed on Percocet 1 tab 4 times daily was initially on fentanyl which was discontinued be started patient on Percocet and cyclobenzaprine and these helped with  pain Therapy saw the patient and patient was able to ambulate some-he will be able to discharge home with outpatient follow-up with neurosurgery with Dr. Arnoldo Morale and I will CC him on this note ?  UTI Known BPH with chronic urinary retention/Foley--does see Dr. Gloriann Loan of urology in the outpatient setting and has been recommended against a TURP and was supposed to go for an interventional radiology procedure on July 21 Has been on antibiotics recently for about a month--unfortunately the specimen was obtained on the Foley catheter leading to on clarity of diagnosis and I do not think that the organisms growing Acinetobacter/Pantoea were actually representative of infection Patient was given 1-2 doses of ceftriaxone and discontinued Continue finasteride 5 daily, Flomax 0.4 twice daily Outpatient follow-up with Dr. Gloriann Loan and referral as needed Hypertension Poorly controlled-continue amlodipine 5 mg, Lotensin 10 daily May need to add another class of agent if continues to be uncontrolled Depression Continue citalopram 20 daily, trazodone 50 at bedtime for sleep   Discharge Exam: Vitals:   08/21/21 2101 08/22/21 0603  BP: 133/71 138/67  Pulse: 77 83  Resp: 17 17  Temp: 98.5 F (36.9 C) 97.8 F (36.6 C)  SpO2: 98% 99%    Subj on day of d/c   Awake coherent no distress looks well was able to move around 7 slept well last night no fever no pain no chills  General Exam on discharge  Alert coherent moving 4 limbs equally power is good in lower extremities No chest pain no fever chest is clear no added sound rales rhonchi S1-S2 no murmur no rub no gallop ROM is intact Neurologically intact  Discharge Instructions   Discharge Instructions     Diet - low sodium heart healthy   Complete by: As directed    Discharge  instructions   Complete by: As directed    Make sure that you are careful taking the pain meds-it is recommended that you follow-up with neurosurgeon and call his office to  schedule follow-up for steroid injections in your back in the next several days to week We have not really changed a whole lot of your meds other than adding Zanaflex and Percocet for pain and we will ask therapy to come out and assess you Please get follow-up labs in the outpatient setting In addition please also follow-up with Dr. Gloriann Loan of urology as he will discuss with you further steps for management of your Foley catheter-I did not think you had an infection as they drew the sample off of your old Foley and this was probably colonized and you just completed a course of treatment   Increase activity slowly   Complete by: As directed       Allergies as of 08/22/2021       Reactions   Vicodin [hydrocodone-acetaminophen] Hives, Rash   On neck and chest.        Medication List     STOP taking these medications    cephALEXin 500 MG capsule Commonly known as: KEFLEX   doxycycline 100 MG tablet Commonly known as: VIBRA-TABS   NON FORMULARY       TAKE these medications    acetaminophen 500 MG tablet Commonly known as: TYLENOL Take 1,000 mg by mouth as needed for headache ('stuffy nose').   amLODipine 5 MG tablet Commonly known as: NORVASC Take 5 mg by mouth every evening.   aspirin EC 81 MG tablet Take 81 mg by mouth daily.   atorvastatin 10 MG tablet Commonly known as: LIPITOR Take 10 mg by mouth daily.   benazepril 10 MG tablet Commonly known as: LOTENSIN Take 10 mg by mouth daily.   bisacodyl 5 MG EC tablet Generic drug: bisacodyl Take 10 mg by mouth as needed (constipation). Only take if constipated for 3 days   cetirizine 10 MG tablet Commonly known as: ZYRTEC Take 10 mg by mouth at bedtime.   citalopram 20 MG tablet Commonly known as: CELEXA Take 20 mg by mouth daily.   cyclobenzaprine 5 MG tablet Commonly known as: FLEXERIL Take 1 tablet (5 mg total) by mouth 3 (three) times daily.   finasteride 5 MG tablet Commonly known as: PROSCAR Take 5  mg by mouth daily.   fluticasone 50 MCG/ACT nasal spray Commonly known as: FLONASE Place 2 sprays into both nostrils daily.   GAS-X PO Take 1 tablet by mouth as needed (gas).   meloxicam 7.5 MG tablet Commonly known as: MOBIC Take 7.5 mg by mouth at bedtime.   Olopatadine HCl 0.2 % Soln Place 1 drop into both eyes daily.   oxyCODONE-acetaminophen 5-325 MG tablet Commonly known as: PERCOCET/ROXICET Take 1 tablet by mouth every 6 (six) hours as needed for severe pain.   polyethylene glycol 17 g packet Commonly known as: MIRALAX / GLYCOLAX Take 17 g by mouth at bedtime.   PROBIOTIC PO Take 1 tablet by mouth daily.   STOOL SOFTENER PO Take 2 capsules by mouth at bedtime.   tamsulosin 0.4 MG Caps capsule Commonly known as: FLOMAX Take 0.4 mg by mouth 2 (two) times daily.   traZODone 50 MG tablet Commonly known as: DESYREL Take 75 mg by mouth at bedtime.       Allergies  Allergen Reactions   Vicodin [Hydrocodone-Acetaminophen] Hives and Rash    On neck and chest.  The results of significant diagnostics from this hospitalization (including imaging, microbiology, ancillary and laboratory) are listed below for reference.    Significant Diagnostic Studies: MR LUMBAR SPINE WO CONTRAST  Result Date: 08/20/2021 CLINICAL DATA:  Lumbar radiculopathy with symptoms persisting over 6 weeks of treatment EXAM: MRI LUMBAR SPINE WITHOUT CONTRAST TECHNIQUE: Multiplanar, multisequence MR imaging of the lumbar spine was performed. No intravenous contrast was administered. COMPARISON:  10/03/2011 FINDINGS: Segmentation:  5 lumbar type vertebrae Alignment:  Degenerative straightening of lumbar lordosis. Vertebrae: No fracture, evidence of discitis, or bone lesion. Discogenic endplate edema at W2-9. Conus medullaris and cauda equina: Conus extends to the L1-2 level. Conus and cauda equina appear normal. Paraspinal and other soft tissues: Negative for perispinal mass or inflammation Disc  levels: T12- L1: Mild facet spurring and disc bulging L1-L2: Mild disc bulging and facet spurring. Small bilateral foraminal protrusion L2-L3: Disc narrowing with mild bulging. Mild bilateral facet spurring L3-L4: Moderate to bulky degenerative facet spurring. Ventral spondylitic spurring. No neural compression L4-L5: Interval disc collapse with endplate degeneration and central protrusion. The disc is circumferentially bulging with endplate and facet spurring causing biforaminal impingement. The spinal canal is narrow but patent after laminectomy. Right more than left subarticular recess narrowing with 6 mm synovial cyst at the right subarticular recess L5-S1:Intervertebral ankylosis. Circumferential endplate ridging and mild facet spurring without neural compression. IMPRESSION: 1. Focal advanced degeneration at L4-5 when compared to 2013. The spinal canal is patent after laminectomy but there is advanced bilateral foraminal impingement at this level. Right more than left subarticular recess stenosis with right-sided synovial cyst. 2. L5-S1 intervertebral ankylosis since prior. Electronically Signed   By: Jorje Guild M.D.   On: 08/20/2021 09:14   CT Angio Abd/Pel w/ and/or w/o  Result Date: 08/07/2021 CLINICAL DATA:  Aortic atherosclerosis BPH preop PAE EXAM: CTA ABDOMEN AND PELVIS WITHOUT AND WITH CONTRAST TECHNIQUE: Multidetector CT imaging of the abdomen and pelvis was performed using the standard protocol during bolus administration of intravenous contrast. Multiplanar reconstructed images and MIPs were obtained and reviewed to evaluate the vascular anatomy. RADIATION DOSE REDUCTION: This exam was performed according to the departmental dose-optimization program which includes automated exposure control, adjustment of the mA and/or kV according to patient size and/or use of iterative reconstruction technique. CONTRAST:  123m OMNIPAQUE IOHEXOL 350 MG/ML SOLN COMPARISON:  CT AP, 07/22/2021 and  03/29/2015. [facility] 01/13/2021. FINDINGS: VASCULAR Aorta: Moderate burden of calcified and noncalcified aortoiliac atherosclerosis. Normal caliber aorta without aneurysm, dissection, vasculitis or significant stenosis. Celiac: Celiac artery ostial stenosis, which appears to be from extrinsic compression by the diaphragmatic crus. See key image. The celiac axis is otherwise patent without evidence of aneurysm, dissection, or vasculitis. SMA: Widely patent without evidence of aneurysm, dissection, vasculitis or significant stenosis. Robust pancreaticoduodenal collaterals. Renals: Single bilateral renal arteries are present. Both renal arteries are widely patent without evidence of aneurysm, dissection, vasculitis, fibromuscular dysplasia or significant stenosis. IMA: Ostial atherosclerosis with stenosis. Distal IMA is otherwise patent without evidence of aneurysm, dissection or vasculitis. Pelvis: Moderate burden of calcified and noncalcified bi-iliac atherosclerosis. Patent bilateral hypogastric arteries, with LEFT-greater-than-RIGHT iliac bifurcation calcified atherosclerosis. See key image. No evidence of aneurysm, dissection, or vasculitis. Proximal Outflow: Bilateral common femoral and visualized portions of the superficial and profunda femoral arteries are patent without evidence of aneurysm, dissection, vasculitis or significant stenosis. Veins: No obvious venous abnormality within the limitations of this arterial phase study. Review of the MIP images confirms the above findings. NON-VASCULAR Lower chest: 6  mm subsolid pulmonary nodule at the RIGHT lung base. Hepatobiliary: No focal liver abnormality is seen. Status post cholecystectomy. No biliary dilatation. Pancreas: No pancreatic ductal dilatation or surrounding inflammatory changes. Spleen: Normal in size without focal abnormality. Small perihilar accessory spleen. Adrenals/Urinary Tract: Mild nodular appearance of the LEFT adrenal gland without  discrete lesion. RIGHT adrenal gland is unremarkable. Kidneys are normal, without renal calculi, focal lesion, or hydronephrosis. Decompressed urinary bladder by Foley catheter. Mild circumferential urinary bladder wall thickening. Stomach/Bowel: Stomach is within normal limits. Appendix appears normal. Sigmoid diverticulosis. No evidence of bowel wall thickening, distention, or inflammatory changes. Lymphatic: No enlarged abdominal or pelvic lymph nodes. Reproductive: Prostatomegaly, measuring up to 4.8 x 6.2 x 7.0 cm with a calculated volume of 104 cm^3 Other: Small fat-containing umbilical hernia. Small fat-containing LEFT inguinal hernia versus cord lipoma. No abdominopelvic ascites. Musculoskeletal: Degenerative changes of the spine, greatest at L4-5 and L5-S1. No acute osseous findings. No follow-up is indicated for incidental findings, unless specifically mentioned. IMPRESSION: VASCULAR 1. No acute vascular abnormality within the abdomen or pelvis. 2. Patent bilateral hypogastric arteries with LEFT-greater-than-RIGHT iliac bifurcation calcified atherosclerosis. 3. 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 4. Celiac artery ostial stenosis which appears to be from extrinsic compression as can be seen in celiac artery compression (MALS). NON-VASCULAR 1. No acute abdominopelvic process. 2. Prostatomegaly, with calculated volume of 104 cm^3. 3. 6 mm RIGHT basilar part-solid pulmonary nodule. Recommend a non-contrast Chest CT at 3-6 months to confirm persistence. If unchanged and the solid component remains < 6 mm, an annual non-contrast Chest CT should be performed for 5 years. These guidelines do not apply to immunocompromised patients and patients with cancer. Reference: Radiology. 2017; 284(1):228-43. Michaelle Birks, MD [facility] [address] Radiology Electronically Signed   By: Michaelle Birks M.D.   On: 08/07/2021 13:48    Microbiology: Recent  Results (from the past 240 hour(s))  Urine Culture     Status: Abnormal   Collection Time: 08/20/21  1:42 PM   Specimen: Urine, Catheterized  Result Value Ref Range Status   Specimen Description   Final    URINE, CATHETERIZED NEW CATHERTER Performed at [facility] [address] [place], [place] [address]    Special Requests   Final    NONE Performed at [facility], [place] [address], [place], [place] [address]    Culture (A)  Final    >=100,000 COLONIES/mL PANTOEA SPECIES >=100,000 COLONIES/mL ACINETOBACTER CALCOACETICUS/BAUMANNII COMPLEX    Report Status 08/22/2021 FINAL  Final   Organism ID, Bacteria PANTOEA SPECIES (A)  Final   Organism ID, Bacteria ACINETOBACTER CALCOACETICUS/BAUMANNII COMPLEX (A)  Final      Susceptibility   Acinetobacter calcoaceticus/baumannii complex - MIC*    CEFTAZIDIME 4 SENSITIVE Sensitive     CIPROFLOXACIN <=0.25 SENSITIVE Sensitive     GENTAMICIN <=1 SENSITIVE Sensitive     IMIPENEM <=0.25 SENSITIVE Sensitive     PIP/TAZO <=4 SENSITIVE Sensitive     TRIMETH/SULFA <=20 SENSITIVE Sensitive     AMPICILLIN/SULBACTAM <=2 SENSITIVE Sensitive     * >=100,000 COLONIES/mL ACINETOBACTER CALCOACETICUS/BAUMANNII COMPLEX   Pantoea species - MIC*    CEFAZOLIN <=4 SENSITIVE Sensitive     CEFEPIME <=0.12 SENSITIVE Sensitive     CEFTRIAXONE <=0.25 SENSITIVE Sensitive     CIPROFLOXACIN <=0.25 SENSITIVE Sensitive     GENTAMICIN <=1 SENSITIVE Sensitive     IMIPENEM <=0.25 SENSITIVE Sensitive     NITROFURANTOIN 64 INTERMEDIATE Intermediate  TRIMETH/SULFA <=20 SENSITIVE Sensitive     PIP/TAZO <=4 SENSITIVE Sensitive     * >=100,000 COLONIES/mL PANTOEA SPECIES     Labs: Basic Metabolic Panel: Recent Labs  Lab 08/20/21 0410 08/21/21 0500  NA 137 140  K 4.0 4.0  CL 107 109  CO2 23 24  GLUCOSE 105* 107*  BUN 20 18  CREATININE 1.04 0.83  CALCIUM 9.4 9.0   Liver Function Tests: Recent Labs  Lab 08/20/21 0410  08/21/21 0500  AST 19 16  ALT 17 17  ALKPHOS 75 69  BILITOT 0.5 0.7  PROT 7.0 6.4*  ALBUMIN 4.0 3.5   Recent Labs  Lab 08/20/21 0410  LIPASE 40   No results for input(s): "AMMONIA" in the last 168 hours. CBC: Recent Labs  Lab 08/20/21 0410 08/21/21 0500  WBC 8.0 9.6  NEUTROABS 5.0  --   HGB 14.5 13.9  HCT 42.1 41.0  MCV 88.3 88.9  PLT 200 183   Cardiac Enzymes: Recent Labs  Lab 08/21/21 0226  CKTOTAL 98   BNP: BNP (last 3 results) No results for input(s): "BNP" in the last 8760 hours.  ProBNP (last 3 results) No results for input(s): "PROBNP" in the last 8760 hours.  CBG: No results for input(s): "GLUCAP" in the last 168 hours.     Signed:  Nita Sells MD   [facility] 08/22/2021, 10:09 AM

## 2021-08-22 NOTE — TOC Transition Note (Signed)
Transition of Care [facility]) - CM/SW Discharge Note   Patient Details  Name: SONU KRUCKENBERG MRN: [id number] Date of Birth: Mar 20, 1933  Transition of Care [facility] CM/SW Contact:  Lennart Pall, LCSW Phone Number: 08/22/2021, 12:58 PM   Clinical Narrative:    Met with pt and daughter today to review dc planning needs.  Both confirm that patient lives alone with son living next door.  Son and daughter can provide intermittent support and daughter reports she will look into arranging some CNA assist under his LTC insurance coverage.  There are agreeable to HHPT/OT follow up and no agency preference - referral placed with [facility].  No further TOC needs.   Final next level of care: [place] Barriers to Discharge: No Barriers Identified   Patient Goals and CMS Choice Patient states their goals for this hospitalization and ongoing recovery are:: return home      Discharge Placement                       Discharge Plan and Services                DME Arranged: N/A DME Agency: NA       HH Arranged: OT, PT HH Agency: [facility] Date [place]: 08/22/21 Time [place]: 1257 Representative spoke with at [place]: [place] Determinants of Health ([place]) Interventions     Readmission Risk Interventions     No data to display

## 2021-08-22 NOTE — Progress Notes (Signed)
Physical Therapy Treatment Patient Details Name: Ricky Bradley MRN: [id number] DOB: 12/15/1933 Today's Date: 08/22/2021   History of Present Illness Patient is a 88 year old male who presented with numbness in both legs R greater than L. 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 patient admitted with ambulatory disfunction, UTI, HTN and depression. PMH: BPH with chronic urinary retention, L3-L4, L4-L5 laminectomy 08/2010, C6-C7 decompressive laminectomy complicated by cervical epidural hematoma status postevacuation in 2013.    PT Comments    Pt supine in bed upon entry and agreeable to be seen, pt's daughter present for session. Required min guard for bed mobility with encouragement to observe back precautions, min guard for transfers, and min guard for ambulation in hallway with RW 175ft. Pt completed stair training safely utilizing sideways technique. Educated pt on walking program, safe toilet transfers using grab bars, and generalized LE exercises, verbalized understanding. Pt and daughter expressed concern for pt to go home without any supervision/assistance and pt is at increased risk for falls secondary to reduced sensation bilaterally, reduced strength, and gait pattern; discharge destination has been updated to HHPT given functional limitations. All questions were answered and education completed. Pt has met mobility goals for safe discharge home, PT is signing off, if needs change please re-consult. Thank you.    Recommendations for follow up therapy are one component of a multi-disciplinary discharge planning process, led by the attending physician.  Recommendations may be updated based on patient status, additional functional criteria and insurance authorization.  Follow Up Recommendations  Home health PT     Assistance Recommended at Discharge Intermittent Supervision/Assistance  Patient can return home  with the following A little help with walking and/or transfers;A little help with bathing/dressing/bathroom;Assistance with cooking/housework;Assist for transportation;Help with stairs or ramp for entrance   Equipment Recommendations  None recommended by PT (Pt has appropriate DME)    Recommendations for Other Services       Precautions / Restrictions Precautions Precautions: Back Precaution Booklet Issued: No Precaution Comments: encourage back precautions, NEEDS shoes on when OOB Restrictions Weight Bearing Restrictions: No     Mobility  Bed Mobility Overal bed mobility: Needs Assistance Bed Mobility: Supine to Sit     Supine to sit: HOB elevated, Min guard     General bed mobility comments: with increased time    Transfers Overall transfer level: Needs assistance Equipment used: Rolling walker (2 wheels) Transfers: Sit to/from Stand Sit to Stand: Min guard           General transfer comment: For safety only, no physical assist required    Ambulation/Gait Ambulation/Gait assistance: Min guard Gait Distance (Feet): 150 Feet Assistive device: Rolling walker (2 wheels) Gait Pattern/deviations: Step-to pattern, Step-through pattern, Decreased step length - right, Decreased step length - left, Shuffle, Trunk flexed, Narrow base of support Gait velocity: decreased     General Gait Details: Pt ambulated with RW and min guard, no physical assist required or overt LOB noted. Pt demonstrated narrow BOS that he could correct with repeated cuing.   Stairs Stairs: Yes Stairs assistance: Min guard Stair Management: One rail Left, Step to pattern, Sideways Number of Stairs: 2 General stair comments: Provided education on sideways stair mobility, pt and daughter verbalized understanding. Pt demonstated safe form with VCs for allowing more space for both feet to fit sideways on the steps. No overt LOB.   Wheelchair Mobility    Modified Rankin (Stroke Patients Only)  Balance Overall balance assessment: Needs assistance Sitting-balance support: No upper extremity supported, Feet supported Sitting balance-Leahy Scale: Fair     Standing balance support: During functional activity, Reliant on assistive device for balance, Bilateral upper extremity supported Standing balance-Leahy Scale: Poor                              Cognition Arousal/Alertness: Awake/alert Behavior During Therapy: WFL for tasks assessed/performed Overall Cognitive Status: Within Functional Limits for tasks assessed                                 General Comments: patients daughter Sharee Pimple was present during session.        Exercises Other Exercises Other Exercises: Sit to stand x5, tactile cues for leaning forward, VCs for "nose over toes, encouraged continued completion and discontinuation of lift chair.    General Comments        Pertinent Vitals/Pain Pain Assessment Pain Assessment: No/denies pain    Home Living                          Prior Function            PT Goals (current goals can now be found in the care plan section) Acute Rehab PT Goals Patient Stated Goal: REgain IND PT Goal Formulation: With patient Time For Goal Achievement: 09/04/21 Potential to Achieve Goals: Good Progress towards PT goals: Progressing toward goals    Frequency    Min 3X/week      PT Plan Discharge plan needs to be updated    Co-evaluation              AM-PAC PT "6 Clicks" Mobility   Outcome Measure  Help needed turning from your back to your side while in a flat bed without using bedrails?: None Help needed moving from lying on your back to sitting on the side of a flat bed without using bedrails?: A Little Help needed moving to and from a bed to a chair (including a wheelchair)?: A Little Help needed standing up from a chair using your arms (e.g., wheelchair or bedside chair)?: A Little Help needed to walk in  hospital room?: A Little Help needed climbing 3-5 steps with a railing? : A Little 6 Click Score: 19    End of Session Equipment Utilized During Treatment: Gait belt Activity Tolerance: Patient tolerated treatment well Patient left: in chair;with call bell/phone within reach;with chair alarm set;with family/visitor present Nurse Communication: Mobility status PT Visit Diagnosis: Unsteadiness on feet (R26.81);Difficulty in walking, not elsewhere classified (R26.2)     Time: 0454-0981 PT Time Calculation (min) (ACUTE ONLY): 28 min  Charges:  $Gait Training: 8-22 mins $Self Care/Home Management: 8-22                     Coolidge Breeze, PT, DPT [facility] Department Office: [phone] Pager: [phone]   Coolidge Breeze 08/22/2021, 12:13 PM

## 2021-08-22 NOTE — Progress Notes (Signed)
AVS reviewed w/ Pt's daughter. Questions addressed. PIV removed.

## 2021-09-01 ENCOUNTER — Other Ambulatory Visit: Payer: Self-pay | Admitting: Student

## 2021-09-01 DIAGNOSIS — R310 Gross hematuria: Secondary | ICD-10-CM

## 2021-09-01 NOTE — H&P (Signed)
Chief Complaint: Patient was seen in consultation today for symptomatic BPH and LUTS with gross hematuria at the request of Ricky Bradley  Referring Physician(s): Ricky Bradley  Supervising Physician: Ricky Bradley  Patient Status: [place]  History of Present Illness: ABDULKAREEM Bradley is a 88 y.o. male with past medical history of BPH, GERD, HLD, HTN and RBBB.  Patient has significant history of hematuria and BPH greater than 10 years that has progressed recently to require permanent bladder catheterization since October 2022.  Patient had ED visit June 2023 for gross hematuria where patient underwent bladder irrigation and upsize foley catheter to a 22 [place].  Patient was offered TURP for symptom control but was reluctant to proceed due to concerns of anesthesia and long-term complications.  Patient consulted with Dr. Vernard Bradley 07/29/2021 for prostate artery embolization.  At that visit patient's IPSS questionnaire score was 15, moderately symptomatic.  The patient decided at that time he was ready to proceed with outpatient procedure with moderate sedation.  Past Medical History:  Diagnosis Date   Enlarged prostate    GERD (gastroesophageal reflux disease)    High cholesterol    High cholesterol    Hypertension    no longer takes bp med-resolved.   RBBB     Past Surgical History:  Procedure Laterality Date   ANTERIOR CERVICAL DECOMP/DISCECTOMY FUSION  01/16/2011   Procedure: ANTERIOR CERVICAL DECOMPRESSION/DISCECTOMY FUSION 1 LEVEL;  Surgeon: Ophelia Charter;  Location: [facility];  Service: Neurosurgery;  Laterality: N/A;  Cervical six-seven  Anterior Cervical Decomp[ression Fusion   ANTERIOR CERVICAL DECOMP/DISCECTOMY FUSION  01/23/2011   Procedure: ANTERIOR CERVICAL DECOMPRESSION/DISCECTOMY FUSION 1 LEVEL/HARDWARE REMOVAL;  Surgeon: Ophelia Charter;  Location: [facility];  Service: Neurosurgery;  Laterality: N/A;  Evacuation of Cervical Six-Seven Hematoma   BACK  SURGERY     CHOLECYSTECTOMY  11/01/10   EYE SURGERY  2008   JOINT REPLACEMENT  2001  and 2002   both knees   laminectomy  08/2010   POSTERIOR CERVICAL FUSION/FORAMINOTOMY  11/06/2011   Procedure: POSTERIOR CERVICAL FUSION/FORAMINOTOMY LEVEL 1;  Surgeon: Ophelia Charter, MD;  Location: [facility];  Service: Neurosurgery;  Laterality: N/A;  C67 laminectomy with posterior cervical fusion with instrumentation   REPLACEMENT TOTAL KNEE      Allergies: Vicodin [hydrocodone-acetaminophen]  Medications: Prior to Admission medications   Medication Sig Start Date End Date Taking? Authorizing Provider  acetaminophen (TYLENOL) 500 MG tablet Take 1,000 mg by mouth as needed for headache ('stuffy nose').    [provider]  amLODipine (NORVASC) 5 MG tablet Take 5 mg by mouth every evening. 09/20/20   [provider]  aspirin 81 MG EC tablet Take 81 mg by mouth daily.     [provider]  atorvastatin (LIPITOR) 10 MG tablet Take 10 mg by mouth daily. 01/07/21   [provider]  benazepril (LOTENSIN) 10 MG tablet Take 10 mg by mouth daily. 12/18/20   [provider]  BISACODYL 5 MG EC tablet Take 10 mg by mouth as needed (constipation). Only take if constipated for 3 days 04/27/21   [provider]  cetirizine (ZYRTEC) 10 MG tablet Take 10 mg by mouth at bedtime. 11/17/20   [provider]  citalopram (CELEXA) 20 MG tablet Take 20 mg by mouth daily.    [provider]  cyclobenzaprine (FLEXERIL) 5 MG tablet Take 1 tablet (5 mg total) by mouth 3 (three) times daily. 08/22/21   Nita Sells, MD  Docusate  Calcium (STOOL SOFTENER PO) Take 2 capsules by mouth at bedtime.    [provider]  finasteride (PROSCAR) 5 MG tablet Take 5 mg by mouth daily. 02/10/21   [provider]  fluticasone (FLONASE) 50 MCG/ACT nasal spray Place 2 sprays into both nostrils daily.    [provider]  meloxicam (MOBIC) 7.5 MG  tablet Take 7.5 mg by mouth at bedtime. 02/24/21   [provider]  Olopatadine HCl 0.2 % SOLN Place 1 drop into both eyes daily. 11/26/18   [provider]  oxyCODONE-acetaminophen (PERCOCET/ROXICET) 5-325 MG tablet Take 1 tablet by mouth every 6 (six) hours as needed for severe pain. 08/22/21   Nita Sells, MD  polyethylene glycol (MIRALAX / GLYCOLAX) 17 g packet Take 17 g by mouth at bedtime.    [provider]  Probiotic Product (PROBIOTIC PO) Take 1 tablet by mouth daily.    [provider]  Simethicone (GAS-X PO) Take 1 tablet by mouth as needed (gas).    [provider]  Tamsulosin HCl (FLOMAX) 0.4 MG CAPS Take 0.4 mg by mouth 2 (two) times daily.    [provider]  traZODone (DESYREL) 50 MG tablet Take 75 mg by mouth at bedtime. 07/12/21   [provider]     History reviewed. No pertinent family history.  Social History   Socioeconomic History   Marital status: Married    Spouse name: Not on file   Number of children: Not on file   Years of education: Not on file   Highest education level: Not on file  Occupational History   Not on file  Tobacco Use   Smoking status: Former    Packs/day: 0.50    Years: 2.00    Total pack years: 1.00    Types: Cigarettes    Quit date: 02/14/1971    Years since quitting: 50.5   Smokeless tobacco: Never  Vaping Use   Vaping Use: Never used  Substance and Sexual Activity   Alcohol use: No   Drug use: No   Sexual activity: Never    Birth control/protection: None  Other Topics Concern   Not on file  Social History Narrative   Not on file   Social Determinants of Health   Financial Resource Strain: Not on file  Food Insecurity: Not on file  Transportation Needs: Not on file  Physical Activity: Not on file  Stress: Not on file  Social Connections: Not on file     Review of Systems: A 12 point ROS discussed and pertinent positives are indicated in the HPI above.   All other systems are negative.  Review of Systems  All other systems reviewed and are negative.   Vital Signs: BP (!) 151/76 (BP Location: Right Arm)   Pulse (!) 107   Temp 97.6 F (36.4 C) (Oral)   Resp 15   Ht 6' (1.829 m)   Wt 179 lb 14.3 oz (81.6 kg)   SpO2 96%   BMI 24.40 kg/m      Physical Exam Vitals reviewed.  Constitutional:      General: He is not in acute distress.    Appearance: Normal appearance. He is not ill-appearing.  HENT:     Head: Normocephalic and atraumatic.     Comments: Hard of hearing    Mouth/Throat:     Mouth: Mucous membranes are dry.     Pharynx: Oropharynx is clear.  Eyes:     Extraocular Movements: Extraocular movements intact.  Pupils: Pupils are equal, round, and reactive to light.  Cardiovascular:     Rate and Rhythm: Normal rate and regular rhythm.     Pulses: Normal pulses.  Pulmonary:     Effort: Pulmonary effort is normal. No respiratory distress.     Breath sounds: Normal breath sounds.  Abdominal:     General: Bowel sounds are normal. There is no distension.     Palpations: Abdomen is soft.     Tenderness: There is no abdominal tenderness. There is no guarding.  Genitourinary:    Comments: Urinary catheter in place  Musculoskeletal:     Right lower leg: No edema.     Left lower leg: No edema.  Skin:    General: Skin is warm and dry.  Neurological:     Mental Status: He is alert and oriented to person, place, and time.  Psychiatric:        Mood and Affect: Mood normal.        Behavior: Behavior normal.        Thought Content: Thought content normal.        Judgment: Judgment normal.     Imaging: MR LUMBAR SPINE WO CONTRAST  Result Date: 08/20/2021 CLINICAL DATA:  Lumbar radiculopathy with symptoms persisting over 6 weeks of treatment EXAM: MRI LUMBAR SPINE WITHOUT CONTRAST TECHNIQUE: Multiplanar, multisequence MR imaging of the lumbar spine was performed. No intravenous contrast was administered. COMPARISON:   10/03/2011 FINDINGS: Segmentation:  5 lumbar type vertebrae Alignment:  Degenerative straightening of lumbar lordosis. Vertebrae: No fracture, evidence of discitis, or bone lesion. Discogenic endplate edema at J6-7. Conus medullaris and cauda equina: Conus extends to the L1-2 level. Conus and cauda equina appear normal. Paraspinal and other soft tissues: Negative for perispinal mass or inflammation Disc levels: T12- L1: Mild facet spurring and disc bulging L1-L2: Mild disc bulging and facet spurring. Small bilateral foraminal protrusion L2-L3: Disc narrowing with mild bulging. Mild bilateral facet spurring L3-L4: Moderate to bulky degenerative facet spurring. Ventral spondylitic spurring. No neural compression L4-L5: Interval disc collapse with endplate degeneration and central protrusion. The disc is circumferentially bulging with endplate and facet spurring causing biforaminal impingement. The spinal canal is narrow but patent after laminectomy. Right more than left subarticular recess narrowing with 6 mm synovial cyst at the right subarticular recess L5-S1:Intervertebral ankylosis. Circumferential endplate ridging and mild facet spurring without neural compression. IMPRESSION: 1. Focal advanced degeneration at L4-5 when compared to 2013. The spinal canal is patent after laminectomy but there is advanced bilateral foraminal impingement at this level. Right more than left subarticular recess stenosis with right-sided synovial cyst. 2. L5-S1 intervertebral ankylosis since prior. Electronically Signed   By: Jorje Guild M.D.   On: 08/20/2021 09:14   CT Angio Abd/Pel w/ and/or w/o  Result Date: 08/07/2021 CLINICAL DATA:  Aortic atherosclerosis BPH preop PAE EXAM: CTA ABDOMEN AND PELVIS WITHOUT AND WITH CONTRAST TECHNIQUE: Multidetector CT imaging of the abdomen and pelvis was performed using the standard protocol during bolus administration of intravenous contrast. Multiplanar reconstructed images and MIPs were  obtained and reviewed to evaluate the vascular anatomy. RADIATION DOSE REDUCTION: This exam was performed according to the departmental dose-optimization program which includes automated exposure control, adjustment of the mA and/or kV according to patient size and/or use of iterative reconstruction technique. CONTRAST:  141m OMNIPAQUE IOHEXOL 350 MG/ML SOLN COMPARISON:  CT AP, 07/22/2021 and 03/29/2015. [facility] 01/13/2021. FINDINGS: VASCULAR Aorta: Moderate burden of calcified and noncalcified aortoiliac atherosclerosis. Normal caliber aorta without  aneurysm, dissection, vasculitis or significant stenosis. Celiac: Celiac artery ostial stenosis, which appears to be from extrinsic compression by the diaphragmatic crus. See key image. The celiac axis is otherwise patent without evidence of aneurysm, dissection, or vasculitis. SMA: Widely patent without evidence of aneurysm, dissection, vasculitis or significant stenosis. Robust pancreaticoduodenal collaterals. Renals: Single bilateral renal arteries are present. Both renal arteries are widely patent without evidence of aneurysm, dissection, vasculitis, fibromuscular dysplasia or significant stenosis. IMA: Ostial atherosclerosis with stenosis. Distal IMA is otherwise patent without evidence of aneurysm, dissection or vasculitis. Pelvis: Moderate burden of calcified and noncalcified bi-iliac atherosclerosis. Patent bilateral hypogastric arteries, with LEFT-greater-than-RIGHT iliac bifurcation calcified atherosclerosis. See key image. No evidence of aneurysm, dissection, or vasculitis. Proximal Outflow: Bilateral common femoral and visualized portions of the superficial and profunda femoral arteries are patent without evidence of aneurysm, dissection, vasculitis or significant stenosis. Veins: No obvious venous abnormality within the limitations of this arterial phase study. Review of the MIP images confirms the above findings. NON-VASCULAR Lower chest: 6 mm  subsolid pulmonary nodule at the RIGHT lung base. Hepatobiliary: No focal liver abnormality is seen. Status post cholecystectomy. No biliary dilatation. Pancreas: No pancreatic ductal dilatation or surrounding inflammatory changes. Spleen: Normal in size without focal abnormality. Small perihilar accessory spleen. Adrenals/Urinary Tract: Mild nodular appearance of the LEFT adrenal gland without discrete lesion. RIGHT adrenal gland is unremarkable. Kidneys are normal, without renal calculi, focal lesion, or hydronephrosis. Decompressed urinary bladder by Foley catheter. Mild circumferential urinary bladder wall thickening. Stomach/Bowel: Stomach is within normal limits. Appendix appears normal. Sigmoid diverticulosis. No evidence of bowel wall thickening, distention, or inflammatory changes. Lymphatic: No enlarged abdominal or pelvic lymph nodes. Reproductive: Prostatomegaly, measuring up to 4.8 x 6.2 x 7.0 cm with a calculated volume of 104 cm^3 Other: Small fat-containing umbilical hernia. Small fat-containing LEFT inguinal hernia versus cord lipoma. No abdominopelvic ascites. Musculoskeletal: Degenerative changes of the spine, greatest at L4-5 and L5-S1. No acute osseous findings. No follow-up is indicated for incidental findings, unless specifically mentioned. IMPRESSION: VASCULAR 1. No acute vascular abnormality within the abdomen or pelvis. 2. Patent bilateral hypogastric arteries with LEFT-greater-than-RIGHT iliac bifurcation calcified atherosclerosis. 3. 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 4. Celiac artery ostial stenosis which appears to be from extrinsic compression as can be seen in celiac artery compression (MALS). NON-VASCULAR 1. No acute abdominopelvic process. 2. Prostatomegaly, with calculated volume of 104 cm^3. 3. 6 mm RIGHT basilar part-solid pulmonary nodule. Recommend a non-contrast Chest CT at 3-6 months to confirm persistence. If unchanged and the solid component  remains < 6 mm, an annual non-contrast Chest CT should be performed for 5 years. These guidelines do not apply to immunocompromised patients and patients with cancer. Reference: Radiology. 2017; 284(1):228-43. Michaelle Birks, MD [facility] Electronically Signed   By: Michaelle Birks M.D.   On: 08/07/2021 13:48    Labs:  CBC: Recent Labs    07/22/21 0000 07/22/21 0015 08/20/21 0410 08/21/21 0500 09/02/21 0659  WBC 7.7  --  8.0 9.6 7.3  HGB 14.0 13.6 14.5 13.9 14.2  HCT 40.3 40.0 42.1 41.0 41.2  PLT 176  --  200 183 197    COAGS: Recent Labs    07/22/21 0001 09/02/21 0659  INR 1.0 1.0    BMP: Recent Labs    07/22/21 0000 07/22/21 0015 08/20/21 0410 08/21/21 0500 09/02/21 0659  NA 132* 130* 137 140 134*  K 4.0 4.1 4.0 4.0 3.9  CL  100 98 107 109 103  CO2 23  --  '23 24 23  '$ GLUCOSE 108* 108* 105* 107* 126*  BUN '22 22 20 18 19  '$ CALCIUM 9.2  --  9.4 9.0 9.2  CREATININE 0.97 1.00 1.04 0.83 0.94  GFRNONAA >60  --  >60 >60 >60    LIVER FUNCTION TESTS: Recent Labs    07/22/21 0000 08/20/21 0410 08/21/21 0500  BILITOT 0.8 0.5 0.7  AST '15 19 16  '$ ALT '15 17 17  '$ ALKPHOS 73 75 69  PROT 6.4* 7.0 6.4*  ALBUMIN 3.7 4.0 3.5    TUMOR MARKERS: No results for input(s): "AFPTM", "CEA", "CA199", "CHROMGRNA" in the last 8760 hours.  Assessment and Plan: Mr. Highfill is an 88 year old male with past medical history significant for BPH, GERD, HLD, HTN and RBBB.  Patient has significant history of hematuria and BPH greater than 10 years that has progressed recently to require permanent bladder catheterization since October 2022.  Patient had ED visit June 2023 for gross hematuria where patient underwent bladder irrigation and upsize foley catheter to a 22 [place].  Patient was offered TURP for symptom control but was reluctant to proceed due to concerns of anesthesia and long-term complications.  Patient consulted with Dr. Vernard Bradley  07/29/2021 for prostate artery embolization.  At that visit patient's IPSS questionnaire score was 15, moderately symptomatic.  The patient decided at that time he was ready to proceed with outpatient procedure with moderate sedation.  Pt resting on stretcher. He is A&O, calm and pleasant.  Pt is HOH.  Pt states he is NPO per order.    The Risks and benefits of prostate artery embolization with possible suprapubic catheter placement were discussed with the patient including, but not limited to bleeding, infection, vascular injury, post operative pain, or contrast induced renal failure.  This procedure involves the use of X-rays and because of the nature of the planned procedure, it is possible that we will have prolonged use of X-ray fluoroscopy.  Potential radiation risks to you include (but are not limited to) the following: - A slightly elevated risk for cancer several years later in life. This risk is typically less than 0.5% percent. This risk is low in comparison to the normal incidence of human cancer, which is 33% for women and 50% for men according to the [place]. - Radiation induced injury can include skin redness, resembling a rash, tissue breakdown / ulcers and hair loss (which can be temporary or permanent).   The likelihood of either of these occurring depends on the difficulty of the procedure and whether you are sensitive to radiation due to previous procedures, disease, or genetic conditions.   IF your procedure requires a prolonged use of radiation, you will be notified and given written instructions for further action.  It is your responsibility to monitor the irradiated area for the 2 weeks following the procedure and to notify your physician if you are concerned that you have suffered a radiation induced injury.    All of the patient's questions were answered, patient is agreeable to proceed. Consent signed and in chart.   Thank you for this interesting  consult.  I greatly enjoyed meeting Ricky Bradley and look forward to participating in their care.  A copy of this report was sent to the requesting provider on this date.  Electronically Signed: Tyson Alias, NP 09/02/2021, 8:23 AM   I spent a total of 20 minutes in face to face in clinical consultation,  greater than 50% of which was counseling/coordinating care for symptomatic BPH and LUTS with gross hematuria.

## 2021-09-02 ENCOUNTER — Ambulatory Visit (HOSPITAL_COMMUNITY)
Admission: RE | Admit: 2021-09-02 | Discharge: 2021-09-02 | Disposition: A | Discharge status: Home / Self Care | Payer: Medicare Other | Source: Ambulatory Visit | Attending: Interventional Radiology | Admitting: Interventional Radiology

## 2021-09-02 ENCOUNTER — Other Ambulatory Visit: Payer: Self-pay

## 2021-09-02 ENCOUNTER — Other Ambulatory Visit (HOSPITAL_COMMUNITY): Payer: Self-pay | Admitting: Radiology

## 2021-09-02 ENCOUNTER — Other Ambulatory Visit (HOSPITAL_COMMUNITY): Payer: Self-pay | Admitting: Interventional Radiology

## 2021-09-02 ENCOUNTER — Encounter (HOSPITAL_COMMUNITY): Payer: Self-pay

## 2021-09-02 DIAGNOSIS — I4510 Unspecified right bundle-branch block: Secondary | ICD-10-CM | POA: Insufficient documentation

## 2021-09-02 DIAGNOSIS — Z87891 Personal history of nicotine dependence: Secondary | ICD-10-CM | POA: Insufficient documentation

## 2021-09-02 DIAGNOSIS — K219 Gastro-esophageal reflux disease without esophagitis: Secondary | ICD-10-CM | POA: Insufficient documentation

## 2021-09-02 DIAGNOSIS — N401 Enlarged prostate with lower urinary tract symptoms: Secondary | ICD-10-CM

## 2021-09-02 DIAGNOSIS — I10 Essential (primary) hypertension: Secondary | ICD-10-CM | POA: Insufficient documentation

## 2021-09-02 DIAGNOSIS — E785 Hyperlipidemia, unspecified: Secondary | ICD-10-CM | POA: Diagnosis not present

## 2021-09-02 DIAGNOSIS — R310 Gross hematuria: Secondary | ICD-10-CM | POA: Insufficient documentation

## 2021-09-02 HISTORY — PX: IR ANGIOGRAM SELECTIVE EACH ADDITIONAL VESSEL: IMG667

## 2021-09-02 HISTORY — PX: IR EMBO TUMOR ORGAN ISCHEMIA INFARCT INC GUIDE ROADMAPPING: IMG5449

## 2021-09-02 HISTORY — PX: IR 3D INDEPENDENT WKST: IMG2385

## 2021-09-02 HISTORY — PX: IR ANGIOGRAM PELVIS SELECTIVE OR SUPRASELECTIVE: IMG661

## 2021-09-02 HISTORY — PX: IR US GUIDE VASC ACCESS LEFT: IMG2389

## 2021-09-02 LAB — CBC
HCT: 41.2 % (ref 39.0–52.0)
Hemoglobin: 14.2 g/dL (ref 13.0–17.0)
MCH: 30.1 pg (ref 26.0–34.0)
MCHC: 34.5 g/dL (ref 30.0–36.0)
MCV: 87.5 fL (ref 80.0–100.0)
Platelets: 197 K/uL (ref 150–400)
RBC: 4.71 MIL/uL (ref 4.22–5.81)
RDW: 12.4 % (ref 11.5–15.5)
WBC: 7.3 K/uL (ref 4.0–10.5)
nRBC: 0 % (ref 0.0–0.2)

## 2021-09-02 LAB — BASIC METABOLIC PANEL
Anion gap: 8 (ref 5–15)
BUN: 19 mg/dL (ref 8–23)
CO2: 23 mmol/L (ref 22–32)
Calcium: 9.2 mg/dL (ref 8.9–10.3)
Chloride: 103 mmol/L (ref 98–111)
Creatinine, Ser: 0.94 mg/dL (ref 0.61–1.24)
GFR, Estimated: >60 mL/min (ref >60)
Glucose, Bld: 126 mg/dL — ABNORMAL HIGH (ref 70–99)
Potassium: 3.9 mmol/L (ref 3.5–5.1)
Sodium: 134 mmol/L — ABNORMAL LOW (ref 135–145)

## 2021-09-02 LAB — PROTIME-INR
INR: 1 (ref 0.8–1.2)
Prothrombin Time: 13.2 seconds (ref 11.4–15.2)

## 2021-09-02 MED ORDER — SOLIFENACIN SUCCINATE 5 MG PO TABS: 5.0000 mg | ORAL_TABLET | Freq: Every day | ORAL | 0 refills | Status: AC

## 2021-09-02 MED ORDER — BISACODYL 5 MG PO TBEC
5.0000 mg | DELAYED_RELEASE_TABLET | Freq: Every day | ORAL | 0 refills | Status: AC | PRN
Start: 2021-09-02 — End: ?

## 2021-09-02 MED ORDER — IOHEXOL 300 MG/ML  SOLN
100.0000 mL | Freq: Once | INTRAMUSCULAR | Status: AC | PRN
  Administered 2021-09-02: 30 mL via INTRA_ARTERIAL

## 2021-09-02 MED ORDER — IOHEXOL 300 MG/ML  SOLN
100.0000 mL | Freq: Once | INTRAMUSCULAR | Status: AC | PRN
  Administered 2021-09-02: 33 mL via INTRA_ARTERIAL

## 2021-09-02 MED ORDER — NITROGLYCERIN IN D5W 100-5 MCG/ML-% IV SOLN
INTRAVENOUS | Status: AC
  Filled 2021-09-02: qty 250

## 2021-09-02 MED ORDER — MIDAZOLAM HCL 2 MG/2ML IJ SOLN
INTRAMUSCULAR | Status: AC | PRN
  Administered 2021-09-02: 0.5 mg via INTRAVENOUS

## 2021-09-02 MED ORDER — MIDAZOLAM HCL 2 MG/2ML IJ SOLN
INTRAMUSCULAR | Status: AC | PRN
  Administered 2021-09-02 (×4): .5 mg via INTRAVENOUS

## 2021-09-02 MED ORDER — FENTANYL CITRATE (PF) 100 MCG/2ML IJ SOLN
INTRAMUSCULAR | Status: AC | PRN
  Administered 2021-09-02: 25 ug via INTRAVENOUS

## 2021-09-02 MED ORDER — MIDAZOLAM HCL 2 MG/2ML IJ SOLN
INTRAMUSCULAR | Status: AC
  Filled 2021-09-02: qty 2

## 2021-09-02 MED ORDER — IOHEXOL 300 MG/ML  SOLN
100.0000 mL | Freq: Once | INTRAMUSCULAR | Status: AC | PRN
  Administered 2021-09-02: 44 mL via INTRA_ARTERIAL

## 2021-09-02 MED ORDER — CIPROFLOXACIN HCL 500 MG PO TABS: 500.0000 mg | ORAL_TABLET | Freq: Two times a day (BID) | ORAL | 0 refills | Status: AC

## 2021-09-02 MED ORDER — LIDOCAINE HCL (PF) 1 % IJ SOLN
INTRAMUSCULAR | Status: AC
  Filled 2021-09-02: qty 30

## 2021-09-02 MED ORDER — IOHEXOL 300 MG/ML  SOLN
50.0000 mL | Freq: Once | INTRAMUSCULAR | Status: AC | PRN
  Administered 2021-09-02: 25 mL via INTRA_ARTERIAL

## 2021-09-02 MED ORDER — HEPARIN SODIUM (PORCINE) 1000 UNIT/ML IJ SOLN
INTRAMUSCULAR | Status: AC
  Filled 2021-09-02: qty 10

## 2021-09-02 MED ORDER — VERAPAMIL HCL 2.5 MG/ML IV SOLN
INTRA_ARTERIAL | Status: AC | PRN
  Administered 2021-09-02: 6 mL via INTRA_ARTERIAL

## 2021-09-02 MED ORDER — CIPROFLOXACIN IN D5W 400 MG/200ML IV SOLN
INTRAVENOUS | Status: AC
  Administered 2021-09-02: 400 mg
  Filled 2021-09-02: qty 200

## 2021-09-02 MED ORDER — MIDAZOLAM HCL 2 MG/2ML IJ SOLN
INTRAMUSCULAR | Status: AC
  Filled 2021-09-02: qty 4

## 2021-09-02 MED ORDER — CIPROFLOXACIN HCL 500 MG PO TABS
500.0000 mg | ORAL_TABLET | Freq: Once | ORAL | Status: DC
  Filled 2021-09-02: qty 1

## 2021-09-02 MED ORDER — MIDAZOLAM HCL 2 MG/2ML IJ SOLN
INTRAMUSCULAR | Status: AC | PRN
  Administered 2021-09-02: .5 mg via INTRAVENOUS

## 2021-09-02 MED ORDER — IBUPROFEN 800 MG PO TABS: 800.0000 mg | ORAL_TABLET | Freq: Three times a day (TID) | ORAL | 0 refills | Status: AC | PRN

## 2021-09-02 MED ORDER — SODIUM CHLORIDE 0.9 % IV SOLN: INTRAVENOUS | Status: DC

## 2021-09-02 MED ORDER — VERAPAMIL HCL 2.5 MG/ML IV SOLN
INTRAVENOUS | Status: AC
  Filled 2021-09-02: qty 2

## 2021-09-02 MED ORDER — PHENAZOPYRIDINE HCL 100 MG PO TABS: 100.0000 mg | ORAL_TABLET | Freq: Three times a day (TID) | ORAL | 0 refills | Status: AC | PRN

## 2021-09-02 MED ORDER — FENTANYL CITRATE (PF) 100 MCG/2ML IJ SOLN
INTRAMUSCULAR | Status: AC | PRN
  Administered 2021-09-02 (×4): 25 ug via INTRAVENOUS

## 2021-09-02 MED ORDER — LIDOCAINE HCL (PF) 1 % IJ SOLN
INTRAMUSCULAR | Status: AC | PRN
  Administered 2021-09-02: 5 mL

## 2021-09-02 MED ORDER — NITROGLYCERIN 2 % TD OINT
TOPICAL_OINTMENT | TRANSDERMAL | Status: AC
  Administered 2021-09-02: 1 [in_us] via TOPICAL
  Filled 2021-09-02: qty 1

## 2021-09-02 MED ORDER — CIPROFLOXACIN IN D5W 400 MG/200ML IV SOLN: 400.0000 mg | Freq: Once | INTRAVENOUS | Status: AC

## 2021-09-02 MED ORDER — FENTANYL CITRATE (PF) 100 MCG/2ML IJ SOLN
INTRAMUSCULAR | Status: AC
  Filled 2021-09-02: qty 2

## 2021-09-02 NOTE — Procedures (Addendum)
  Procedure:  Bilateral prostate artery embolization via L radial art Preprocedure diagnosis:   Diagnoses of Hyperplasia of prostate with lower urinary tract symptoms (LUTS) and Gross hematuria were pertinent to this visit.  Postprocedure diagnosis: same EBL:    minimal Complications:   none immediate  See full dictation in [employer].  Dillard Cannon MD Main # [phone] Pager  [phone] Mobile [phone]

## 2021-09-02 NOTE — Discharge Instructions (Addendum)
Please call Interventional Radiology clinic [phone] with any questions or concerns.  You may remove your dressing and shower tomorrow.   Radial Site Care  This sheet gives you information about how to care for yourself after your procedure. Your health care provider may also give you more specific instructions. If you have problems or questions, contact your health care provider. What can I expect after the procedure? After the procedure, it is common to have: Bruising and tenderness at the catheter insertion area. Follow these instructions at home: Medicines Take over-the-counter and prescription medicines only as told by your health care provider. Insertion site care Follow instructions from your health care provider about how to take care of your insertion site. Make sure you: Wash your hands with soap and water before you change your bandage (dressing). If soap and water are not available, use hand sanitizer. Change your dressing as told by your health care provider. Leave stitches (sutures), skin glue, or adhesive strips in place. These skin closures may need to stay in place for 2 weeks or longer. If adhesive strip edges start to loosen and curl up, you may trim the loose edges. Do not remove adhesive strips completely unless your health care provider tells you to do that. Check your insertion site every day for signs of infection. Check for: Redness, swelling, or pain. Fluid or blood. Pus or a bad smell. Warmth. Do not take baths, swim, or use a hot tub until your health care provider approves. You may shower 24-48 hours after the procedure, or as directed by your health care provider. Remove the dressing and gently wash the site with plain soap and water. Pat the area dry with a clean towel. Do not rub the site. That could cause bleeding. Do not apply powder or lotion to the site. Activity  For 24 hours after the procedure, or as directed by your health care provider: Do  not flex or bend the affected arm. Do not push or pull heavy objects with the affected arm. Do not drive yourself home from the hospital or clinic. You may drive 24 hours after the procedure unless your health care provider tells you not to. Do not operate machinery or power tools. Do not lift anything that is heavier than 10 lb (4.5 kg), or the limit that you are told, until your health care provider says that it is safe. Ask your health care provider when it is okay to: Return to work or school. Resume usual physical activities or sports. Resume sexual activity. General instructions If the catheter site starts to bleed, raise your arm and put firm pressure on the site. If the bleeding does not stop, get help right away. This is a medical emergency. If you went home on the same day as your procedure, a responsible adult should be with you for the first 24 hours after you arrive home. Keep all follow-up visits as told by your health care provider. This is important. Contact a health care provider if: You have a fever. You have redness, swelling, or yellow drainage around your insertion site. Get help right away if: You have unusual pain at the radial site. The catheter insertion area swells very fast. The insertion area is bleeding, and the bleeding does not stop when you hold steady pressure on the area. Your arm or hand becomes pale, cool, tingly, or numb. These symptoms may represent a serious problem that is an emergency. Do not wait to see if the symptoms will go  away. Get medical help right away. Call your local emergency services (911 in the [place] Do not drive yourself to the hospital. Summary After the procedure, it is common to have bruising and tenderness at the site. Follow instructions from your health care provider about how to take care of your radial site wound. Check the wound every day for signs of infection. Do not lift anything that is heavier than 10 lb (4.5 kg), or the  limit that you are told, until your health care provider says that it is safe. This information is not intended to replace advice given to you by your health care provider. Make sure you discuss any questions you have with your health care provider. Document Revised: 03/07/2017 Document Reviewed: 03/07/2017 Elsevier Patient Education  2020 [employer].

## 2021-09-03 ENCOUNTER — Encounter (HOSPITAL_COMMUNITY): Payer: Self-pay

## 2021-09-03 ENCOUNTER — Other Ambulatory Visit: Payer: Self-pay

## 2021-09-03 ENCOUNTER — Emergency Department (HOSPITAL_COMMUNITY)
Admission: EM | Admit: 2021-09-03 | Discharge: 2021-09-03 | Disposition: A | Discharge status: Home / Self Care | Payer: Medicare Other | Attending: Emergency Medicine | Admitting: Emergency Medicine

## 2021-09-03 DIAGNOSIS — R102 Pelvic and perineal pain: Secondary | ICD-10-CM | POA: Insufficient documentation

## 2021-09-03 DIAGNOSIS — Z7982 Long term (current) use of aspirin: Secondary | ICD-10-CM | POA: Diagnosis not present

## 2021-09-03 DIAGNOSIS — N4889 Other specified disorders of penis: Secondary | ICD-10-CM | POA: Insufficient documentation

## 2021-09-03 DIAGNOSIS — Z79899 Other long term (current) drug therapy: Secondary | ICD-10-CM | POA: Diagnosis not present

## 2021-09-03 DIAGNOSIS — I10 Essential (primary) hypertension: Secondary | ICD-10-CM | POA: Diagnosis not present

## 2021-09-03 DIAGNOSIS — G8918 Other acute postprocedural pain: Secondary | ICD-10-CM | POA: Diagnosis present

## 2021-09-03 LAB — LIPASE, BLOOD: Lipase: 52 U/L — ABNORMAL HIGH (ref 11–51)

## 2021-09-03 LAB — COMPREHENSIVE METABOLIC PANEL
ALT: 15 U/L (ref 0–44)
AST: 15 U/L (ref 15–41)
Albumin: 3.7 g/dL (ref 3.5–5.0)
Alkaline Phosphatase: 79 U/L (ref 38–126)
Anion gap: 7 (ref 5–15)
BUN: 19 mg/dL (ref 8–23)
CO2: 24 mmol/L (ref 22–32)
Calcium: 9 mg/dL (ref 8.9–10.3)
Chloride: 101 mmol/L (ref 98–111)
Creatinine, Ser: 1.04 mg/dL (ref 0.61–1.24)
GFR, Estimated: >60 mL/min (ref >60)
Glucose, Bld: 102 mg/dL — ABNORMAL HIGH (ref 70–99)
Potassium: 4.1 mmol/L (ref 3.5–5.1)
Sodium: 132 mmol/L — ABNORMAL LOW (ref 135–145)
Total Bilirubin: 0.8 mg/dL (ref 0.3–1.2)
Total Protein: 6.6 g/dL (ref 6.5–8.1)

## 2021-09-03 LAB — CBC
HCT: 41.5 % (ref 39.0–52.0)
Hemoglobin: 14.2 g/dL (ref 13.0–17.0)
MCH: 30.5 pg (ref 26.0–34.0)
MCHC: 34.2 g/dL (ref 30.0–36.0)
MCV: 89.2 fL (ref 80.0–100.0)
Platelets: 201 10*3/uL (ref 150–400)
RBC: 4.65 MIL/uL (ref 4.22–5.81)
RDW: 12.8 % (ref 11.5–15.5)
WBC: 11.6 10*3/uL — ABNORMAL HIGH (ref 4.0–10.5)
nRBC: 0 % (ref 0.0–0.2)

## 2021-09-03 MED ORDER — KETOROLAC TROMETHAMINE 30 MG/ML IJ SOLN
30.0000 mg | Freq: Once | INTRAMUSCULAR | Status: AC
  Administered 2021-09-03: 30 mg via INTRAVENOUS
  Filled 2021-09-03: qty 1

## 2021-09-03 MED ORDER — OXYCODONE-ACETAMINOPHEN 5-325 MG PO TABS
1.0000 | ORAL_TABLET | Freq: Once | ORAL | Status: AC
  Administered 2021-09-03: 1 via ORAL
  Filled 2021-09-03: qty 1

## 2021-09-03 MED ORDER — FENTANYL CITRATE PF 50 MCG/ML IJ SOSY: 50.0000 ug | PREFILLED_SYRINGE | Freq: Once | INTRAMUSCULAR | Status: DC

## 2021-09-03 NOTE — ED Notes (Signed)
Bladder scan showed 0, 350 ml emptied from leg bag.

## 2021-09-03 NOTE — ED Provider Notes (Signed)
[facility]-EMERGENCY DEPT Provider Note   CSN: [id number] Arrival date & time: 09/03/21  1200     History  Chief Complaint  Patient presents with   Post-op Problem   Abdominal Pain    Ricky Bradley is a 88 y.o. male. With past medical history of hypertension, BPH, IBS, GERD who presents to the emergency department with abdominal pain.  States that he had arterial embolization yesterday and began having a pressure sensation this morning around 10AM. States that he has had decreased urine output from his catheter since 8AM this morning and has a burning sensation at the meatus. Has had associated "flushed" feeling, without nausea or objective fever.   On chart review, patient had bilateral prostate artery embolization via left radial artery yesterday due to hyperplasia and hematuria. Patient has had BPH >10 years recently requiring permanent bladder catheterization since 11/2020.   Abdominal Pain      Home Medications Prior to Admission medications   Medication Sig Start Date End Date Taking? Authorizing Provider  acetaminophen (TYLENOL) 500 MG tablet Take 1,000 mg by mouth as needed for headache ('stuffy nose').    [provider]  amLODipine (NORVASC) 5 MG tablet Take 5 mg by mouth every evening. 09/20/20   [provider]  aspirin 81 MG EC tablet Take 81 mg by mouth daily.     [provider]  atorvastatin (LIPITOR) 10 MG tablet Take 10 mg by mouth daily. 01/07/21   [provider]  benazepril (LOTENSIN) 10 MG tablet Take 10 mg by mouth daily. 12/18/20   [provider]  bisacodyl (DULCOLAX) 5 MG EC tablet Take 1 tablet (5 mg total) by mouth daily as needed for moderate constipation. 09/02/21   Alene Mires, NP  BISACODYL 5 MG EC tablet Take 10 mg by mouth as needed (constipation). Only take if constipated for 3 days 04/27/21   [provider]  cetirizine (ZYRTEC) 10 MG tablet Take 10 mg by mouth at  bedtime. 11/17/20   [provider]  ciprofloxacin (CIPRO) 500 MG tablet Take 1 tablet (500 mg total) by mouth 2 (two) times daily for 7 days. 09/02/21 09/09/21  Alene Mires, NP  citalopram (CELEXA) 20 MG tablet Take 20 mg by mouth daily.    [provider]  cyclobenzaprine (FLEXERIL) 5 MG tablet Take 1 tablet (5 mg total) by mouth 3 (three) times daily. 08/22/21   Rhetta Mura, MD  Docusate Calcium (STOOL SOFTENER PO) Take 2 capsules by mouth at bedtime.    [provider]  finasteride (PROSCAR) 5 MG tablet Take 5 mg by mouth daily. 02/10/21   [provider]  fluticasone (FLONASE) 50 MCG/ACT nasal spray Place 2 sprays into both nostrils daily.    [provider]  ibuprofen (ADVIL) 800 MG tablet Take 1 tablet (800 mg total) by mouth every 8 (eight) hours as needed for up to 7 days. 09/02/21 09/09/21  Alene Mires, NP  meloxicam (MOBIC) 7.5 MG tablet Take 7.5 mg by mouth at bedtime. 02/24/21   [provider]  Olopatadine HCl 0.2 % SOLN Place 1 drop into both eyes daily. 11/26/18   [provider]  oxyCODONE-acetaminophen (PERCOCET/ROXICET) 5-325 MG tablet Take 1 tablet by mouth every 6 (six) hours as needed for severe pain. 08/22/21   Rhetta Mura, MD  phenazopyridine (PYRIDIUM) 100 MG tablet Take 1 tablet (100 mg total) by mouth 3 (three) times daily as needed for up to 7 days for pain.  09/02/21 09/09/21  Alene Mires, NP  polyethylene glycol (MIRALAX / GLYCOLAX) 17 g packet Take 17 g by mouth at bedtime.    [provider]  Probiotic Product (PROBIOTIC PO) Take 1 tablet by mouth daily.    [provider]  Simethicone (GAS-X PO) Take 1 tablet by mouth as needed (gas).    [provider]  solifenacin (VESICARE) 5 MG tablet Take 1 tablet (5 mg total) by mouth daily for 7 days. 09/02/21 09/09/21  Alene Mires, NP  Tamsulosin HCl (FLOMAX) 0.4 MG CAPS Take 0.4 mg by mouth 2  (two) times daily.    [provider]  traZODone (DESYREL) 50 MG tablet Take 75 mg by mouth at bedtime. 07/12/21   [provider]      Allergies    Vicodin [hydrocodone-acetaminophen]    Review of Systems   Review of Systems  Constitutional:  Positive for diaphoresis.  Gastrointestinal:  Positive for abdominal pain.  Genitourinary:  Positive for decreased urine volume and penile pain.  All other systems reviewed and are negative.   Physical Exam Updated Vital Signs BP 109/60 (BP Location: Left Arm)   Pulse (!) 101   Temp 98 F (36.7 C) (Oral)   Resp 18   Ht 6' (1.829 m)   Wt 81.2 kg   SpO2 100%   BMI 24.28 kg/m  Physical Exam Vitals and nursing note reviewed.  Constitutional:      Appearance: Normal appearance. He is ill-appearing.  HENT:     Head: Normocephalic.     Mouth/Throat:     Pharynx: Oropharynx is clear.  Eyes:     General: No scleral icterus. Cardiovascular:     Rate and Rhythm: Normal rate and regular rhythm.     Heart sounds: Normal heart sounds. No murmur heard. Pulmonary:     Effort: Pulmonary effort is normal. No respiratory distress.     Breath sounds: Normal breath sounds.  Abdominal:     General: Abdomen is protuberant. Bowel sounds are normal. There is no distension.     Palpations: Abdomen is soft.     Tenderness: There is abdominal tenderness in the suprapubic area.  Musculoskeletal:        General: Normal range of motion.  Skin:    General: Skin is warm and dry.     Capillary Refill: Capillary refill takes less than 2 seconds.  Neurological:     General: No focal deficit present.     Mental Status: He is alert and oriented to person, place, and time. Mental status is at baseline.  Psychiatric:        Mood and Affect: Mood normal.        Behavior: Behavior normal.        Thought Content: Thought content normal.        Judgment: Judgment normal.     ED Results / Procedures / Treatments   Labs (all labs ordered are  listed, but only abnormal results are displayed) Labs Reviewed  LIPASE, BLOOD - Abnormal; Notable for the following components:      Result Value   Lipase 52 (*)    All other components within normal limits  COMPREHENSIVE METABOLIC PANEL - Abnormal; Notable for the following components:   Sodium 132 (*)    Glucose, Bld 102 (*)    All other components within normal limits  CBC - Abnormal; Notable for the following components:   WBC 11.6 (*)    All other components within  normal limits   EKG None  Radiology IR EMBO TUMOR ORGAN ISCHEMIA INFARCT INC GUIDE ROADMAPPING  Result Date: 09/02/2021 CLINICAL DATA:  BPH, lower urinary tract symptoms. See previous consultation EXAM: EXAM 1. ULTRASOUND-GUIDED ACCESS OF THE LEFT RADIAL ARTERY 2. BILATERAL INTERNAL ILIAC ANGIOGRAPHY 3. SELECTIVE BILATERAL PELVIC ANGIOGRAPHY 4. CONE BEAM CT ANGIOGRAM OF THE PELVIS 5. RIGHT PROSTATE ARTERY EMBOLIZATION WITH PARTICLES AND COIL 6.  LEFT  PROSTATE ARTERY EMBOLIZATION WITH PARTICLES 6. COMPLETION PELVIC ANGIOGRAPHY MEDICATIONS: Intravenous Fentanyl and Versed 3mg  were administered as conscious sedation during continuous monitoring of the patient's level of consciousness and physiological / cardiorespiratory status by the radiology RN, with a total moderate sedation time of 3 hours 30 minutes. TECHNIQUE: Informed consent was obtained from the patient following explanation of the procedure, risks, benefits and alternatives. The patient understands, agrees and consents for the procedure. All questions were addressed. A time out was performed prior to the initiation of the procedure. Maximal barrier sterile technique utilized including caps, mask, sterile gowns, sterile gloves, large sterile drape, hand hygiene, and chlorhexidine prep. Operators: Deanne Coffer, Suttle Left wrist was prepped and draped in standard fashion. Preprocedure ultrasound evaluation of the left radial artery demonstrates appropriate size (diameter  of 0.28 cm) for radial access. Local anesthesia was provided at the planned needle entry site with 1% lidocaine. Puncture of the left radial artery was performed under direct ultrasound visualization with a 21 gauge micropuncture needle. Permanent image was captured and stored in the record. A microwire was introduced without resistance. The needle which was then exchanged for a 4/5 French slender sheath. The wire and inner stylet were removed. The standard radial cocktail was slowly administered, diluted with autologous arterial blood. A 5 French MG2 Glidecatheter was then, under direct fluoroscopic visualization, directed over a leading Coons wire to the abdominal aorta. The right internal iliac artery was then selected. Internal iliac angiography was performed at a steep ipsilateral oblique. The prostatic artery was identified. A 1.9 [place] Progreat Lambda microcatheter and 0.014 inch Synchro Soft microwire were introduced and used to select the prostatic artery. Cone beam CT was then performed to confirm this location. After selective arteriogram, superior branch was catheterized. Embolization was performed with 400 micron hydropearl microspheres. The catheter was withdrawn and the common trunk with distal inferior vesicle branches, which was embolized with a 2 mm coil. Catheter was redirected into inferior branch of the prostatic artery. After confirmatory arteriogram demonstrated good prostatic blush and no significant collateral branches, distal branches embolized with 400 micron hydropearl microspheres. Follow-up arteriogram shows cessation of flow in distal branches, no evident complication. The micro system was removed and the 5 French catheter was retracted to the aortic bifurcation. Catheter was exchanged for a 135 cm device. Over Coons wire, the catheter was directed into the left internal iliac artery. Internal iliac angiography was performed at a steep ipsilateral oblique. The prostatic artery is  again seen arising from the proximal anterior division of the internal iliac artery. A 1.9 [place] Progreat Lambda microcatheter and 0.014 inch Synchro Soft microwire were introduced and used to select the left prostatic artery. Left prostate artery angiogram was performed which demonstrated parenchymal blushing in the area of the expected left hemi prostate. Cone beam CT was then performed to confirm this location. Next, 400 micron hydropearl microspheres were then used to embolize the left hemi prostate. The proximal prostatic artery was embolized to hemostasis. The catheter was retracted partially. Completion angiogram was performed which demonstrated no evidence of persistent perfusion  to the left hemi prostate. The catheters were then removed. Additional standard radial cocktail was administered slowly. A TR band was applied to the left wrist and the indwelling sheath was removed. The patient tolerated procedure well was transferred to the recovery area in good condition. FLUOROSCOPY: Radiation Exposure Index (as provided by the fluoroscopic device): 2508 mGy air Kerma COMPLICATIONS: COMPLICATIONS none IMPRESSION: 1. Technically successful bilateral prostatic artery embolization using 400 micron hydropearl microspheres. PLAN: Follow-up interventional radiology clinic 1 month for consideration of voiding trial. Electronically Signed   By: Corlis Leak M.D.   On: 09/02/2021 18:15   IR US Guide Vasc Access Left  Result Date: 09/02/2021 CLINICAL DATA:  BPH, lower urinary tract symptoms. See previous consultation EXAM: EXAM 1. ULTRASOUND-GUIDED ACCESS OF THE LEFT RADIAL ARTERY 2. BILATERAL INTERNAL ILIAC ANGIOGRAPHY 3. SELECTIVE BILATERAL PELVIC ANGIOGRAPHY 4. CONE BEAM CT ANGIOGRAM OF THE PELVIS 5. RIGHT PROSTATE ARTERY EMBOLIZATION WITH PARTICLES AND COIL 6.  LEFT  PROSTATE ARTERY EMBOLIZATION WITH PARTICLES 6. COMPLETION PELVIC ANGIOGRAPHY MEDICATIONS: Intravenous Fentanyl and Versed 3mg  were administered  as conscious sedation during continuous monitoring of the patient's level of consciousness and physiological / cardiorespiratory status by the radiology RN, with a total moderate sedation time of 3 hours 30 minutes. TECHNIQUE: Informed consent was obtained from the patient following explanation of the procedure, risks, benefits and alternatives. The patient understands, agrees and consents for the procedure. All questions were addressed. A time out was performed prior to the initiation of the procedure. Maximal barrier sterile technique utilized including caps, mask, sterile gowns, sterile gloves, large sterile drape, hand hygiene, and chlorhexidine prep. Operators: Deanne Coffer, Suttle Left wrist was prepped and draped in standard fashion. Preprocedure ultrasound evaluation of the left radial artery demonstrates appropriate size (diameter of 0.28 cm) for radial access. Local anesthesia was provided at the planned needle entry site with 1% lidocaine. Puncture of the left radial artery was performed under direct ultrasound visualization with a 21 gauge micropuncture needle. Permanent image was captured and stored in the record. A microwire was introduced without resistance. The needle which was then exchanged for a 4/5 French slender sheath. The wire and inner stylet were removed. The standard radial cocktail was slowly administered, diluted with autologous arterial blood. A 5 French MG2 Glidecatheter was then, under direct fluoroscopic visualization, directed over a leading Coons wire to the abdominal aorta. The right internal iliac artery was then selected. Internal iliac angiography was performed at a steep ipsilateral oblique. The prostatic artery was identified. A 1.9 [place] Progreat Lambda microcatheter and 0.014 inch Synchro Soft microwire were introduced and used to select the prostatic artery. Cone beam CT was then performed to confirm this location. After selective arteriogram, superior branch was catheterized.  Embolization was performed with 400 micron hydropearl microspheres. The catheter was withdrawn and the common trunk with distal inferior vesicle branches, which was embolized with a 2 mm coil. Catheter was redirected into inferior branch of the prostatic artery. After confirmatory arteriogram demonstrated good prostatic blush and no significant collateral branches, distal branches embolized with 400 micron hydropearl microspheres. Follow-up arteriogram shows cessation of flow in distal branches, no evident complication. The micro system was removed and the 5 French catheter was retracted to the aortic bifurcation. Catheter was exchanged for a 135 cm device. Over Coons wire, the catheter was directed into the left internal iliac artery. Internal iliac angiography was performed at a steep ipsilateral oblique. The prostatic artery is again seen arising from the proximal anterior division of  the internal iliac artery. A 1.9 [place] Progreat Lambda microcatheter and 0.014 inch Synchro Soft microwire were introduced and used to select the left prostatic artery. Left prostate artery angiogram was performed which demonstrated parenchymal blushing in the area of the expected left hemi prostate. Cone beam CT was then performed to confirm this location. Next, 400 micron hydropearl microspheres were then used to embolize the left hemi prostate. The proximal prostatic artery was embolized to hemostasis. The catheter was retracted partially. Completion angiogram was performed which demonstrated no evidence of persistent perfusion to the left hemi prostate. The catheters were then removed. Additional standard radial cocktail was administered slowly. A TR band was applied to the left wrist and the indwelling sheath was removed. The patient tolerated procedure well was transferred to the recovery area in good condition. FLUOROSCOPY: Radiation Exposure Index (as provided by the fluoroscopic device): 2508 mGy air Kerma COMPLICATIONS:  COMPLICATIONS none IMPRESSION: 1. Technically successful bilateral prostatic artery embolization using 400 micron hydropearl microspheres. PLAN: Follow-up interventional radiology clinic 1 month for consideration of voiding trial. Electronically Signed   By: Corlis Leak M.D.   On: 09/02/2021 18:15   IR Angiogram Pelvis Selective Or Supraselective  Result Date: 09/02/2021 CLINICAL DATA:  BPH, lower urinary tract symptoms. See previous consultation EXAM: EXAM 1. ULTRASOUND-GUIDED ACCESS OF THE LEFT RADIAL ARTERY 2. BILATERAL INTERNAL ILIAC ANGIOGRAPHY 3. SELECTIVE BILATERAL PELVIC ANGIOGRAPHY 4. CONE BEAM CT ANGIOGRAM OF THE PELVIS 5. RIGHT PROSTATE ARTERY EMBOLIZATION WITH PARTICLES AND COIL 6.  LEFT  PROSTATE ARTERY EMBOLIZATION WITH PARTICLES 6. COMPLETION PELVIC ANGIOGRAPHY MEDICATIONS: Intravenous Fentanyl and Versed 3mg  were administered as conscious sedation during continuous monitoring of the patient's level of consciousness and physiological / cardiorespiratory status by the radiology RN, with a total moderate sedation time of 3 hours 30 minutes. TECHNIQUE: Informed consent was obtained from the patient following explanation of the procedure, risks, benefits and alternatives. The patient understands, agrees and consents for the procedure. All questions were addressed. A time out was performed prior to the initiation of the procedure. Maximal barrier sterile technique utilized including caps, mask, sterile gowns, sterile gloves, large sterile drape, hand hygiene, and chlorhexidine prep. Operators: Deanne Coffer, Suttle Left wrist was prepped and draped in standard fashion. Preprocedure ultrasound evaluation of the left radial artery demonstrates appropriate size (diameter of 0.28 cm) for radial access. Local anesthesia was provided at the planned needle entry site with 1% lidocaine. Puncture of the left radial artery was performed under direct ultrasound visualization with a 21 gauge micropuncture needle.  Permanent image was captured and stored in the record. A microwire was introduced without resistance. The needle which was then exchanged for a 4/5 French slender sheath. The wire and inner stylet were removed. The standard radial cocktail was slowly administered, diluted with autologous arterial blood. A 5 French MG2 Glidecatheter was then, under direct fluoroscopic visualization, directed over a leading Coons wire to the abdominal aorta. The right internal iliac artery was then selected. Internal iliac angiography was performed at a steep ipsilateral oblique. The prostatic artery was identified. A 1.9 [place] Progreat Lambda microcatheter and 0.014 inch Synchro Soft microwire were introduced and used to select the prostatic artery. Cone beam CT was then performed to confirm this location. After selective arteriogram, superior branch was catheterized. Embolization was performed with 400 micron hydropearl microspheres. The catheter was withdrawn and the common trunk with distal inferior vesicle branches, which was embolized with a 2 mm coil. Catheter was redirected into inferior branch of the prostatic  artery. After confirmatory arteriogram demonstrated good prostatic blush and no significant collateral branches, distal branches embolized with 400 micron hydropearl microspheres. Follow-up arteriogram shows cessation of flow in distal branches, no evident complication. The micro system was removed and the 5 French catheter was retracted to the aortic bifurcation. Catheter was exchanged for a 135 cm device. Over Coons wire, the catheter was directed into the left internal iliac artery. Internal iliac angiography was performed at a steep ipsilateral oblique. The prostatic artery is again seen arising from the proximal anterior division of the internal iliac artery. A 1.9 [place] Progreat Lambda microcatheter and 0.014 inch Synchro Soft microwire were introduced and used to select the left prostatic artery. Left prostate  artery angiogram was performed which demonstrated parenchymal blushing in the area of the expected left hemi prostate. Cone beam CT was then performed to confirm this location. Next, 400 micron hydropearl microspheres were then used to embolize the left hemi prostate. The proximal prostatic artery was embolized to hemostasis. The catheter was retracted partially. Completion angiogram was performed which demonstrated no evidence of persistent perfusion to the left hemi prostate. The catheters were then removed. Additional standard radial cocktail was administered slowly. A TR band was applied to the left wrist and the indwelling sheath was removed. The patient tolerated procedure well was transferred to the recovery area in good condition. FLUOROSCOPY: Radiation Exposure Index (as provided by the fluoroscopic device): 2508 mGy air Kerma COMPLICATIONS: COMPLICATIONS none IMPRESSION: 1. Technically successful bilateral prostatic artery embolization using 400 micron hydropearl microspheres. PLAN: Follow-up interventional radiology clinic 1 month for consideration of voiding trial. Electronically Signed   By: Corlis Leak M.D.   On: 09/02/2021 18:15   IR Angiogram Selective Each Additional Vessel  Result Date: 09/02/2021 CLINICAL DATA:  BPH, lower urinary tract symptoms. See previous consultation EXAM: EXAM 1. ULTRASOUND-GUIDED ACCESS OF THE LEFT RADIAL ARTERY 2. BILATERAL INTERNAL ILIAC ANGIOGRAPHY 3. SELECTIVE BILATERAL PELVIC ANGIOGRAPHY 4. CONE BEAM CT ANGIOGRAM OF THE PELVIS 5. RIGHT PROSTATE ARTERY EMBOLIZATION WITH PARTICLES AND COIL 6.  LEFT  PROSTATE ARTERY EMBOLIZATION WITH PARTICLES 6. COMPLETION PELVIC ANGIOGRAPHY MEDICATIONS: Intravenous Fentanyl and Versed 3mg  were administered as conscious sedation during continuous monitoring of the patient's level of consciousness and physiological / cardiorespiratory status by the radiology RN, with a total moderate sedation time of 3 hours 30 minutes.  TECHNIQUE: Informed consent was obtained from the patient following explanation of the procedure, risks, benefits and alternatives. The patient understands, agrees and consents for the procedure. All questions were addressed. A time out was performed prior to the initiation of the procedure. Maximal barrier sterile technique utilized including caps, mask, sterile gowns, sterile gloves, large sterile drape, hand hygiene, and chlorhexidine prep. Operators: Deanne Coffer, Suttle Left wrist was prepped and draped in standard fashion. Preprocedure ultrasound evaluation of the left radial artery demonstrates appropriate size (diameter of 0.28 cm) for radial access. Local anesthesia was provided at the planned needle entry site with 1% lidocaine. Puncture of the left radial artery was performed under direct ultrasound visualization with a 21 gauge micropuncture needle. Permanent image was captured and stored in the record. A microwire was introduced without resistance. The needle which was then exchanged for a 4/5 French slender sheath. The wire and inner stylet were removed. The standard radial cocktail was slowly administered, diluted with autologous arterial blood. A 5 French MG2 Glidecatheter was then, under direct fluoroscopic visualization, directed over a leading Coons wire to the abdominal aorta. The right internal iliac artery was  then selected. Internal iliac angiography was performed at a steep ipsilateral oblique. The prostatic artery was identified. A 1.9 [place] Progreat Lambda microcatheter and 0.014 inch Synchro Soft microwire were introduced and used to select the prostatic artery. Cone beam CT was then performed to confirm this location. After selective arteriogram, superior branch was catheterized. Embolization was performed with 400 micron hydropearl microspheres. The catheter was withdrawn and the common trunk with distal inferior vesicle branches, which was embolized with a 2 mm coil. Catheter was redirected  into inferior branch of the prostatic artery. After confirmatory arteriogram demonstrated good prostatic blush and no significant collateral branches, distal branches embolized with 400 micron hydropearl microspheres. Follow-up arteriogram shows cessation of flow in distal branches, no evident complication. The micro system was removed and the 5 French catheter was retracted to the aortic bifurcation. Catheter was exchanged for a 135 cm device. Over Coons wire, the catheter was directed into the left internal iliac artery. Internal iliac angiography was performed at a steep ipsilateral oblique. The prostatic artery is again seen arising from the proximal anterior division of the internal iliac artery. A 1.9 [place] Progreat Lambda microcatheter and 0.014 inch Synchro Soft microwire were introduced and used to select the left prostatic artery. Left prostate artery angiogram was performed which demonstrated parenchymal blushing in the area of the expected left hemi prostate. Cone beam CT was then performed to confirm this location. Next, 400 micron hydropearl microspheres were then used to embolize the left hemi prostate. The proximal prostatic artery was embolized to hemostasis. The catheter was retracted partially. Completion angiogram was performed which demonstrated no evidence of persistent perfusion to the left hemi prostate. The catheters were then removed. Additional standard radial cocktail was administered slowly. A TR band was applied to the left wrist and the indwelling sheath was removed. The patient tolerated procedure well was transferred to the recovery area in good condition. FLUOROSCOPY: Radiation Exposure Index (as provided by the fluoroscopic device): 2508 mGy air Kerma COMPLICATIONS: COMPLICATIONS none IMPRESSION: 1. Technically successful bilateral prostatic artery embolization using 400 micron hydropearl microspheres. PLAN: Follow-up interventional radiology clinic 1 month for consideration of  voiding trial. Electronically Signed   By: Corlis Leak M.D.   On: 09/02/2021 18:15   IR 3D Independent Annabell Sabal  Result Date: 09/02/2021 CLINICAL DATA:  BPH, lower urinary tract symptoms. See previous consultation EXAM: EXAM 1. ULTRASOUND-GUIDED ACCESS OF THE LEFT RADIAL ARTERY 2. BILATERAL INTERNAL ILIAC ANGIOGRAPHY 3. SELECTIVE BILATERAL PELVIC ANGIOGRAPHY 4. CONE BEAM CT ANGIOGRAM OF THE PELVIS 5. RIGHT PROSTATE ARTERY EMBOLIZATION WITH PARTICLES AND COIL 6.  LEFT  PROSTATE ARTERY EMBOLIZATION WITH PARTICLES 6. COMPLETION PELVIC ANGIOGRAPHY MEDICATIONS: Intravenous Fentanyl and Versed 3mg  were administered as conscious sedation during continuous monitoring of the patient's level of consciousness and physiological / cardiorespiratory status by the radiology RN, with a total moderate sedation time of 3 hours 30 minutes. TECHNIQUE: Informed consent was obtained from the patient following explanation of the procedure, risks, benefits and alternatives. The patient understands, agrees and consents for the procedure. All questions were addressed. A time out was performed prior to the initiation of the procedure. Maximal barrier sterile technique utilized including caps, mask, sterile gowns, sterile gloves, large sterile drape, hand hygiene, and chlorhexidine prep. Operators: Deanne Coffer, Suttle Left wrist was prepped and draped in standard fashion. Preprocedure ultrasound evaluation of the left radial artery demonstrates appropriate size (diameter of 0.28 cm) for radial access. Local anesthesia was provided at the planned needle entry site with 1% lidocaine.  Puncture of the left radial artery was performed under direct ultrasound visualization with a 21 gauge micropuncture needle. Permanent image was captured and stored in the record. A microwire was introduced without resistance. The needle which was then exchanged for a 4/5 French slender sheath. The wire and inner stylet were removed. The standard radial cocktail  was slowly administered, diluted with autologous arterial blood. A 5 French MG2 Glidecatheter was then, under direct fluoroscopic visualization, directed over a leading Coons wire to the abdominal aorta. The right internal iliac artery was then selected. Internal iliac angiography was performed at a steep ipsilateral oblique. The prostatic artery was identified. A 1.9 [place] Progreat Lambda microcatheter and 0.014 inch Synchro Soft microwire were introduced and used to select the prostatic artery. Cone beam CT was then performed to confirm this location. After selective arteriogram, superior branch was catheterized. Embolization was performed with 400 micron hydropearl microspheres. The catheter was withdrawn and the common trunk with distal inferior vesicle branches, which was embolized with a 2 mm coil. Catheter was redirected into inferior branch of the prostatic artery. After confirmatory arteriogram demonstrated good prostatic blush and no significant collateral branches, distal branches embolized with 400 micron hydropearl microspheres. Follow-up arteriogram shows cessation of flow in distal branches, no evident complication. The micro system was removed and the 5 French catheter was retracted to the aortic bifurcation. Catheter was exchanged for a 135 cm device. Over Coons wire, the catheter was directed into the left internal iliac artery. Internal iliac angiography was performed at a steep ipsilateral oblique. The prostatic artery is again seen arising from the proximal anterior division of the internal iliac artery. A 1.9 [place] Progreat Lambda microcatheter and 0.014 inch Synchro Soft microwire were introduced and used to select the left prostatic artery. Left prostate artery angiogram was performed which demonstrated parenchymal blushing in the area of the expected left hemi prostate. Cone beam CT was then performed to confirm this location. Next, 400 micron hydropearl microspheres were then used to  embolize the left hemi prostate. The proximal prostatic artery was embolized to hemostasis. The catheter was retracted partially. Completion angiogram was performed which demonstrated no evidence of persistent perfusion to the left hemi prostate. The catheters were then removed. Additional standard radial cocktail was administered slowly. A TR band was applied to the left wrist and the indwelling sheath was removed. The patient tolerated procedure well was transferred to the recovery area in good condition. FLUOROSCOPY: Radiation Exposure Index (as provided by the fluoroscopic device): 2508 mGy air Kerma COMPLICATIONS: COMPLICATIONS none IMPRESSION: 1. Technically successful bilateral prostatic artery embolization using 400 micron hydropearl microspheres. PLAN: Follow-up interventional radiology clinic 1 month for consideration of voiding trial. Electronically Signed   By: Corlis Leak M.D.   On: 09/02/2021 18:15   IR Angiogram Selective Each Additional Vessel  Result Date: 09/02/2021 CLINICAL DATA:  BPH, lower urinary tract symptoms. See previous consultation EXAM: EXAM 1. ULTRASOUND-GUIDED ACCESS OF THE LEFT RADIAL ARTERY 2. BILATERAL INTERNAL ILIAC ANGIOGRAPHY 3. SELECTIVE BILATERAL PELVIC ANGIOGRAPHY 4. CONE BEAM CT ANGIOGRAM OF THE PELVIS 5. RIGHT PROSTATE ARTERY EMBOLIZATION WITH PARTICLES AND COIL 6.  LEFT  PROSTATE ARTERY EMBOLIZATION WITH PARTICLES 6. COMPLETION PELVIC ANGIOGRAPHY MEDICATIONS: Intravenous Fentanyl and Versed 3mg  were administered as conscious sedation during continuous monitoring of the patient's level of consciousness and physiological / cardiorespiratory status by the radiology RN, with a total moderate sedation time of 3 hours 30 minutes. TECHNIQUE: Informed consent was obtained from the patient following explanation of the  procedure, risks, benefits and alternatives. The patient understands, agrees and consents for the procedure. All questions were addressed. A time out was  performed prior to the initiation of the procedure. Maximal barrier sterile technique utilized including caps, mask, sterile gowns, sterile gloves, large sterile drape, hand hygiene, and chlorhexidine prep. Operators: Deanne Coffer, Suttle Left wrist was prepped and draped in standard fashion. Preprocedure ultrasound evaluation of the left radial artery demonstrates appropriate size (diameter of 0.28 cm) for radial access. Local anesthesia was provided at the planned needle entry site with 1% lidocaine. Puncture of the left radial artery was performed under direct ultrasound visualization with a 21 gauge micropuncture needle. Permanent image was captured and stored in the record. A microwire was introduced without resistance. The needle which was then exchanged for a 4/5 French slender sheath. The wire and inner stylet were removed. The standard radial cocktail was slowly administered, diluted with autologous arterial blood. A 5 French MG2 Glidecatheter was then, under direct fluoroscopic visualization, directed over a leading Coons wire to the abdominal aorta. The right internal iliac artery was then selected. Internal iliac angiography was performed at a steep ipsilateral oblique. The prostatic artery was identified. A 1.9 [place] Progreat Lambda microcatheter and 0.014 inch Synchro Soft microwire were introduced and used to select the prostatic artery. Cone beam CT was then performed to confirm this location. After selective arteriogram, superior branch was catheterized. Embolization was performed with 400 micron hydropearl microspheres. The catheter was withdrawn and the common trunk with distal inferior vesicle branches, which was embolized with a 2 mm coil. Catheter was redirected into inferior branch of the prostatic artery. After confirmatory arteriogram demonstrated good prostatic blush and no significant collateral branches, distal branches embolized with 400 micron hydropearl microspheres. Follow-up arteriogram  shows cessation of flow in distal branches, no evident complication. The micro system was removed and the 5 French catheter was retracted to the aortic bifurcation. Catheter was exchanged for a 135 cm device. Over Coons wire, the catheter was directed into the left internal iliac artery. Internal iliac angiography was performed at a steep ipsilateral oblique. The prostatic artery is again seen arising from the proximal anterior division of the internal iliac artery. A 1.9 [place] Progreat Lambda microcatheter and 0.014 inch Synchro Soft microwire were introduced and used to select the left prostatic artery. Left prostate artery angiogram was performed which demonstrated parenchymal blushing in the area of the expected left hemi prostate. Cone beam CT was then performed to confirm this location. Next, 400 micron hydropearl microspheres were then used to embolize the left hemi prostate. The proximal prostatic artery was embolized to hemostasis. The catheter was retracted partially. Completion angiogram was performed which demonstrated no evidence of persistent perfusion to the left hemi prostate. The catheters were then removed. Additional standard radial cocktail was administered slowly. A TR band was applied to the left wrist and the indwelling sheath was removed. The patient tolerated procedure well was transferred to the recovery area in good condition. FLUOROSCOPY: Radiation Exposure Index (as provided by the fluoroscopic device): 2508 mGy air Kerma COMPLICATIONS: COMPLICATIONS none IMPRESSION: 1. Technically successful bilateral prostatic artery embolization using 400 micron hydropearl microspheres. PLAN: Follow-up interventional radiology clinic 1 month for consideration of voiding trial. Electronically Signed   By: Corlis Leak M.D.   On: 09/02/2021 18:15    Procedures Procedures    Medications Ordered in ED Medications  oxyCODONE-acetaminophen (PERCOCET/ROXICET) 5-325 MG per tablet 1 tablet (1 tablet  Oral Given 09/03/21 1407)  ketorolac (TORADOL) 30  MG/ML injection 30 mg (30 mg Intravenous Given 09/03/21 1538)    ED Course/ Medical Decision Making/ A&P                           Medical Decision Making Amount and/or Complexity of Data Reviewed Labs: ordered.  Risk Prescription drug management.  This patient presents to the ED for concern of lower pelvic pain, this involves an extensive number of treatment options, and is a complaint that carries with it a high risk of complications and morbidity.  The differential diagnosis includes prostatitis, ischemic pain from procedure, UTI, obstruction  Co morbidities that complicate the patient evaluation BPH, hypertension  Additional history obtained:  Additional history obtained from: Daughter at bedside External records from outside source obtained and reviewed including: Interventional radiology physician note  Lab Results: I personally ordered, reviewed, and interpreted labs. Pertinent results include: CBC with leukocytosis to 11.6, likely reactive rather than infectious CBC essentially within normal limits Lipase is slightly elevated but inconsequential  Imaging Studies ordered:  I ordered imaging studies which included  bladder scan .  I independently reviewed & interpreted imaging & am in agreement with radiology impression. Imaging shows: Bladder scan with 5 mL  Medications  I ordered medication including oxycodone and Toradol for pain Reevaluation of the patient after medication shows that patient improved -I reviewed the patient's home medications and did not make adjustments. -I did not prescribe new home medications.  Tests Considered: Consider CT abdomen pelvis, after speaking with IR, will defer  Critical Interventions: N/A  Consultations: I requested consultation with the interventional radiologist, Dr. Lowella Dandy,  and discussed lab and imaging findings as well as pertinent plan - they recommend: Pain control and  follow-up next week with Dr. Deanne Coffer.  After and called back later and sent Corrin Parker, PA-C down to see the patient.  SDH None identified  ED Course:  88 year old male who presents emergency department 1 day after a bilateral arterial embolization of his prostate with lower pelvic pain and pain in his penis.    His physical exam is notable for suprapubic tenderness to palpation.  He has an indwelling Foley catheter that does not appear infected.  The urine is yellow and clear.  There is no drainage around the catheter.  Initial bladder scan with 5 mL, not retaining. Labs without evidence of infection and no fevers at home. Bladder scan 5   Consulted and spoke with Dr. Lowella Dandy with IR.  He is not going to perform the procedure but is on-call.  He feels that this pain is likely from the procedure yesterday and likely an ischemia pain, which is expected.  States that he will look over the procedure that was done and call back. After he called back he is sending Corrin Parker, PA-C with interventional radiology down to see the patient in discuss follow-up.  He also states that he will talk with Dr. Deanne Coffer, who performed the procedure, to have him follow-up in clinic early this week.  Toniann Fail, PA-C is at the bedside and evaluated the patient.  She also feels that this is likely postprocedural ischemic pain from the embolization.  Do not feel that there is any need for imaging, intervention.  She requested 30 mg of IV Toradol, which I ordered and was given to the patient.  She feels that he can be discharged with outpatient follow-up.  She will instruct them on pain medication use including ibuprofen at home.  She  is also given them some discharge instructions about typical post procedural course for this.  Will discharge patient after she provides instruction.  Family is agreeable to this plan.  After consideration of the diagnostic results and the patients response to treatment, I feel that the patent  would benefit from discharge. The patient has been appropriately medically screened and/or stabilized in the ED. I have low suspicion for any other emergent medical condition which would require further screening, evaluation or treatment in the ED or require inpatient management. The patient is overall well appearing and non-toxic in appearance. They are hemodynamically stable at time of discharge.   Final Clinical Impression(s) / ED Diagnoses Final diagnoses:  Post-operative pain    Rx / DC Orders ED Discharge Orders     None         Cristopher Peru, PA-C 09/05/21 0981    Mancel Bale, MD 09/05/21 [phone]

## 2021-09-03 NOTE — ED Triage Notes (Signed)
Pt reports lower abdominal pain and burning in his penis  that started this morning. Pt currently has a foley catheter in place. Pt had a bilateral prostate artery embolization yesterday.

## 2021-09-03 NOTE — Discharge Instructions (Addendum)
Patients may experience "post-PAE syndrome" for days following the procedure, which can include nausea, vomiting, fever, pelvic pain, or painful or frequent urination.  You were seen in the emergency department today for pelvic pain.  The interventional radiology PA came to see you to discuss this.  We gave you a dose of Toradol and other pain medication.  As they discussed you may experience pain over the next week following the procedure.  Please follow their instruction on how often to take your Tylenol and Motrin.  I also spoke with Dr. Anselm Pancoast, a radiologist who is going to talk to Dr. Vernard Gambles about seeing you in clinic this week.  Please give them a call on Monday morning to ensure that you are scheduled.  Please return to the emergency department if you have decreased urinary output or significantly worsening pain.

## 2021-09-03 NOTE — ED Provider Notes (Signed)
  Face-to-face evaluation   History: Patient presents for evaluation of lower abdominal pain, which started today.  He has a Foley catheter in place that is draining.  He had bilateral prostate artery embolization procedure for hypertrophy.  He is not taking narcotics for pain   physical exam: Elderly, alert, uncomfortable.  Abdomen is not significantly distended.  Abdomen nontender to palpation.  He is very hard of hearing.  MDM: Evaluation for  Chief Complaint  Patient presents with   Post-op Problem   Abdominal Pain     Patient presenting with lower abdominal pain suspected to be from prostatic embolization which causes infarct of the prostate.  He was given narcotic pain reliever for management of pain in the ED.  Medical screening examination/treatment/procedure(s) were conducted as a shared visit with non-physician practitioner(s) and myself.  I personally evaluated the patient during the encounter    Daleen Bo, MD 09/05/21 [phone]

## 2021-09-03 NOTE — Progress Notes (Signed)
Supervising Physician: Ricky Bradley  Patient Status:  [facility] ED  Chief Complaint:  Post procedure pain  Brief History:  Ricky Bradley is a 88 y.o. malewith past medical history of BPH, GERD, HLD, HTN and RBBB.  He hass significant history of hematuria and BPH greater than 10 years that has progressed recently to require permanent bladder catheterization since October 2022.    He had ED visit June 2023 for gross hematuria where patient underwent bladder irrigation and upsize foley catheter to a 22 [place].  He was offered TURP for symptom control but was reluctant to proceed due to concerns of anesthesia and long-term complications.   He was evaluated by Dr. Vernard Bradley 07/29/2021 for prostate artery embolization.    At that visit patient's IPSS questionnaire score was 15, moderately symptomatic.   He underwent successful embolization procedure yesterday.  Today, he developed pain in lower abdomen which radiated to the end of his penis.  He states it will come and go about every 20 - 30 minutes.  His foley bag is working well with clear yellow urine in the bag.  He confirms a bowel movement this morning.  The foley has been in place for some time now so doubt pain is from bladder spasms.  Allergies: Vicodin [hydrocodone-acetaminophen]  Medications: Prior to Admission medications   Medication Sig Start Date End Date Taking? Authorizing Provider  acetaminophen (TYLENOL) 500 MG tablet Take 1,000 mg by mouth as needed for headache ('stuffy nose').    [provider]  amLODipine (NORVASC) 5 MG tablet Take 5 mg by mouth every evening. 09/20/20   [provider]  aspirin 81 MG EC tablet Take 81 mg by mouth daily.     [provider]  atorvastatin (LIPITOR) 10 MG tablet Take 10 mg by mouth daily. 01/07/21   [provider]  benazepril (LOTENSIN) 10 MG tablet Take 10 mg by mouth daily. 12/18/20   [provider]  bisacodyl (DULCOLAX) 5 MG EC  tablet Take 1 tablet (5 mg total) by mouth daily as needed for moderate constipation. 09/02/21   Ricky Mau, NP  BISACODYL 5 MG EC tablet Take 10 mg by mouth as needed (constipation). Only take if constipated for 3 days 04/27/21   [provider]  cetirizine (ZYRTEC) 10 MG tablet Take 10 mg by mouth at bedtime. 11/17/20   [provider]  ciprofloxacin (CIPRO) 500 MG tablet Take 1 tablet (500 mg total) by mouth 2 (two) times daily for 7 days. 09/02/21 09/09/21  Ricky Mau, NP  citalopram (CELEXA) 20 MG tablet Take 20 mg by mouth daily.    [provider]  cyclobenzaprine (FLEXERIL) 5 MG tablet Take 1 tablet (5 mg total) by mouth 3 (three) times daily. 08/22/21   Ricky Sells, MD  Docusate Calcium (STOOL SOFTENER PO) Take 2 capsules by mouth at bedtime.    [provider]  finasteride (PROSCAR) 5 MG tablet Take 5 mg by mouth daily. 02/10/21   [provider]  fluticasone (FLONASE) 50 MCG/ACT nasal spray Place 2 sprays into both nostrils daily.    [provider]  ibuprofen (ADVIL) 800 MG tablet Take 1 tablet (800 mg total) by mouth every 8 (eight) hours as needed for up to 7 days. 09/02/21 09/09/21  Ricky Mau, NP  meloxicam (MOBIC) 7.5 MG tablet Take 7.5 mg by mouth at bedtime. 02/24/21   [provider]  Olopatadine HCl 0.2 % SOLN Place 1 drop into both eyes  daily. 11/26/18   [provider]  oxyCODONE-acetaminophen (PERCOCET/ROXICET) 5-325 MG tablet Take 1 tablet by mouth every 6 (six) hours as needed for severe pain. 08/22/21   Ricky Sells, MD  phenazopyridine (PYRIDIUM) 100 MG tablet Take 1 tablet (100 mg total) by mouth 3 (three) times daily as needed for up to 7 days for pain. 09/02/21 09/09/21  Ricky Mau, NP  polyethylene glycol (MIRALAX / GLYCOLAX) 17 g packet Take 17 g by mouth at bedtime.    [provider]  Probiotic Product (PROBIOTIC PO) Take 1 tablet by mouth  daily.    [provider]  Simethicone (GAS-X PO) Take 1 tablet by mouth as needed (gas).    [provider]  solifenacin (VESICARE) 5 MG tablet Take 1 tablet (5 mg total) by mouth daily for 7 days. 09/02/21 09/09/21  Ricky Mau, NP  Tamsulosin HCl (FLOMAX) 0.4 MG CAPS Take 0.4 mg by mouth 2 (two) times daily.    [provider]  traZODone (DESYREL) 50 MG tablet Take 75 mg by mouth at bedtime. 07/12/21   [provider]     Vital Signs: BP (!) 148/79   Pulse 91   Temp 98 F (36.7 C) (Oral)   Resp 16   Ht 6' (1.829 m)   Wt 179 lb (81.2 kg)   SpO2 99%   BMI 24.28 kg/m   Physical Exam Constitutional:      Appearance: Normal appearance.  HENT:     Head: Normocephalic and atraumatic.  Cardiovascular:     Rate and Rhythm: Normal rate.  Pulmonary:     Effort: Pulmonary effort is normal. No respiratory distress.  Abdominal:     Tenderness: There is no abdominal tenderness.  Skin:    General: Skin is warm and dry.  Neurological:     General: No focal deficit present.     Mental Status: He is alert and oriented to person, place, and time.  Psychiatric:        Mood and Affect: Mood normal.        Behavior: Behavior normal.        Thought Content: Thought content normal.        Judgment: Judgment normal.   Radial artery stick site looks good Clear yellow urine in bag.  Imaging: IR EMBO TUMOR ORGAN ISCHEMIA INFARCT INC GUIDE ROADMAPPING  Result Date: 09/02/2021 CLINICAL DATA:  BPH, lower urinary tract symptoms. See previous consultation EXAM: EXAM 1. ULTRASOUND-GUIDED ACCESS OF THE LEFT RADIAL ARTERY 2. BILATERAL INTERNAL ILIAC ANGIOGRAPHY 3. SELECTIVE BILATERAL PELVIC ANGIOGRAPHY 4. CONE BEAM CT ANGIOGRAM OF THE PELVIS 5. RIGHT PROSTATE ARTERY EMBOLIZATION WITH PARTICLES AND COIL 6.  LEFT  PROSTATE ARTERY EMBOLIZATION WITH PARTICLES 6. COMPLETION PELVIC ANGIOGRAPHY MEDICATIONS: Intravenous Fentanyl 162mg and Versed '3mg'$  were administered  as conscious sedation during continuous monitoring of the patient's level of consciousness and physiological / cardiorespiratory status by the radiology RN, with a total moderate sedation time of 3 hours 30 minutes. TECHNIQUE: Informed consent was obtained from the patient following explanation of the procedure, risks, benefits and alternatives. The patient understands, agrees and consents for the procedure. All questions were addressed. A time out was performed prior to the initiation of the procedure. Maximal barrier sterile technique utilized including caps, mask, sterile gowns, sterile gloves, large sterile drape, hand hygiene, and chlorhexidine prep. Operators: HVernard Bradley Suttle Left wrist was prepped and draped in standard fashion. Preprocedure ultrasound evaluation of the left radial artery demonstrates appropriate size (diameter of  0.28 cm) for radial access. Local anesthesia was provided at the planned needle entry site with 1% lidocaine. Puncture of the left radial artery was performed under direct ultrasound visualization with a 21 gauge micropuncture needle. Permanent image was captured and stored in the record. A microwire was introduced without resistance. The needle which was then exchanged for a 4/5 French slender sheath. The wire and inner stylet were removed. The standard radial cocktail was slowly administered, diluted with autologous arterial blood. A 5 French MG2 Glidecatheter was then, under direct fluoroscopic visualization, directed over a leading Coons wire to the abdominal aorta. The right internal iliac artery was then selected. Internal iliac angiography was performed at a steep ipsilateral oblique. The prostatic artery was identified. A 1.9 [place] Progreat Lambda microcatheter and 0.014 inch Synchro Soft microwire were introduced and used to select the prostatic artery. Cone beam CT was then performed to confirm this location. After selective arteriogram, superior branch was catheterized.  Embolization was performed with 400 micron hydropearl microspheres. The catheter was withdrawn and the common trunk with distal inferior vesicle branches, which was embolized with a 2 mm coil. Catheter was redirected into inferior branch of the prostatic artery. After confirmatory arteriogram demonstrated good prostatic blush and no significant collateral branches, distal branches embolized with 400 micron hydropearl microspheres. Follow-up arteriogram shows cessation of flow in distal branches, no evident complication. The micro system was removed and the 5 French catheter was retracted to the aortic bifurcation. Catheter was exchanged for a 135 cm device. Over Coons wire, the catheter was directed into the left internal iliac artery. Internal iliac angiography was performed at a steep ipsilateral oblique. The prostatic artery is again seen arising from the proximal anterior division of the internal iliac artery. A 1.9 [place] Progreat Lambda microcatheter and 0.014 inch Synchro Soft microwire were introduced and used to select the left prostatic artery. Left prostate artery angiogram was performed which demonstrated parenchymal blushing in the area of the expected left hemi prostate. Cone beam CT was then performed to confirm this location. Next, 400 micron hydropearl microspheres were then used to embolize the left hemi prostate. The proximal prostatic artery was embolized to hemostasis. The catheter was retracted partially. Completion angiogram was performed which demonstrated no evidence of persistent perfusion to the left hemi prostate. The catheters were then removed. Additional standard radial cocktail was administered slowly. A TR band was applied to the left wrist and the indwelling sheath was removed. The patient tolerated procedure well was transferred to the recovery area in good condition. FLUOROSCOPY: Radiation Exposure Index (as provided by the fluoroscopic device): 2508 mGy air Kerma COMPLICATIONS:  COMPLICATIONS none IMPRESSION: 1. Technically successful bilateral prostatic artery embolization using 400 micron hydropearl microspheres. PLAN: Follow-up interventional radiology clinic 1 month for consideration of voiding trial. Electronically Signed   By: Lucrezia Europe M.D.   On: 09/02/2021 18:15   IR US Guide Vasc Access Left  Result Date: 09/02/2021 CLINICAL DATA:  BPH, lower urinary tract symptoms. See previous consultation EXAM: EXAM 1. ULTRASOUND-GUIDED ACCESS OF THE LEFT RADIAL ARTERY 2. BILATERAL INTERNAL ILIAC ANGIOGRAPHY 3. SELECTIVE BILATERAL PELVIC ANGIOGRAPHY 4. CONE BEAM CT ANGIOGRAM OF THE PELVIS 5. RIGHT PROSTATE ARTERY EMBOLIZATION WITH PARTICLES AND COIL 6.  LEFT  PROSTATE ARTERY EMBOLIZATION WITH PARTICLES 6. COMPLETION PELVIC ANGIOGRAPHY MEDICATIONS: Intravenous Fentanyl 171mg and Versed '3mg'$  were administered as conscious sedation during continuous monitoring of the patient's level of consciousness and physiological / cardiorespiratory status by the radiology RN, with a total moderate sedation  time of 3 hours 30 minutes. TECHNIQUE: Informed consent was obtained from the patient following explanation of the procedure, risks, benefits and alternatives. The patient understands, agrees and consents for the procedure. All questions were addressed. A time out was performed prior to the initiation of the procedure. Maximal barrier sterile technique utilized including caps, mask, sterile gowns, sterile gloves, large sterile drape, hand hygiene, and chlorhexidine prep. Operators: Ricky Bradley, Suttle Left wrist was prepped and draped in standard fashion. Preprocedure ultrasound evaluation of the left radial artery demonstrates appropriate size (diameter of 0.28 cm) for radial access. Local anesthesia was provided at the planned needle entry site with 1% lidocaine. Puncture of the left radial artery was performed under direct ultrasound visualization with a 21 gauge micropuncture needle. Permanent image was  captured and stored in the record. A microwire was introduced without resistance. The needle which was then exchanged for a 4/5 French slender sheath. The wire and inner stylet were removed. The standard radial cocktail was slowly administered, diluted with autologous arterial blood. A 5 French MG2 Glidecatheter was then, under direct fluoroscopic visualization, directed over a leading Coons wire to the abdominal aorta. The right internal iliac artery was then selected. Internal iliac angiography was performed at a steep ipsilateral oblique. The prostatic artery was identified. A 1.9 [place] Progreat Lambda microcatheter and 0.014 inch Synchro Soft microwire were introduced and used to select the prostatic artery. Cone beam CT was then performed to confirm this location. After selective arteriogram, superior branch was catheterized. Embolization was performed with 400 micron hydropearl microspheres. The catheter was withdrawn and the common trunk with distal inferior vesicle branches, which was embolized with a 2 mm coil. Catheter was redirected into inferior branch of the prostatic artery. After confirmatory arteriogram demonstrated good prostatic blush and no significant collateral branches, distal branches embolized with 400 micron hydropearl microspheres. Follow-up arteriogram shows cessation of flow in distal branches, no evident complication. The micro system was removed and the 5 French catheter was retracted to the aortic bifurcation. Catheter was exchanged for a 135 cm device. Over Coons wire, the catheter was directed into the left internal iliac artery. Internal iliac angiography was performed at a steep ipsilateral oblique. The prostatic artery is again seen arising from the proximal anterior division of the internal iliac artery. A 1.9 [place] Progreat Lambda microcatheter and 0.014 inch Synchro Soft microwire were introduced and used to select the left prostatic artery. Left prostate artery angiogram was  performed which demonstrated parenchymal blushing in the area of the expected left hemi prostate. Cone beam CT was then performed to confirm this location. Next, 400 micron hydropearl microspheres were then used to embolize the left hemi prostate. The proximal prostatic artery was embolized to hemostasis. The catheter was retracted partially. Completion angiogram was performed which demonstrated no evidence of persistent perfusion to the left hemi prostate. The catheters were then removed. Additional standard radial cocktail was administered slowly. A TR band was applied to the left wrist and the indwelling sheath was removed. The patient tolerated procedure well was transferred to the recovery area in good condition. FLUOROSCOPY: Radiation Exposure Index (as provided by the fluoroscopic device): 2508 mGy air Kerma COMPLICATIONS: COMPLICATIONS none IMPRESSION: 1. Technically successful bilateral prostatic artery embolization using 400 micron hydropearl microspheres. PLAN: Follow-up interventional radiology clinic 1 month for consideration of voiding trial. Electronically Signed   By: Lucrezia Europe M.D.   On: 09/02/2021 18:15   IR Angiogram Pelvis Selective Or Supraselective  Result Date: 09/02/2021 CLINICAL DATA:  BPH, lower urinary tract symptoms. See previous consultation EXAM: EXAM 1. ULTRASOUND-GUIDED ACCESS OF THE LEFT RADIAL ARTERY 2. BILATERAL INTERNAL ILIAC ANGIOGRAPHY 3. SELECTIVE BILATERAL PELVIC ANGIOGRAPHY 4. CONE BEAM CT ANGIOGRAM OF THE PELVIS 5. RIGHT PROSTATE ARTERY EMBOLIZATION WITH PARTICLES AND COIL 6.  LEFT  PROSTATE ARTERY EMBOLIZATION WITH PARTICLES 6. COMPLETION PELVIC ANGIOGRAPHY MEDICATIONS: Intravenous Fentanyl 165mg and Versed '3mg'$  were administered as conscious sedation during continuous monitoring of the patient's level of consciousness and physiological / cardiorespiratory status by the radiology RN, with a total moderate sedation time of 3 hours 30 minutes. TECHNIQUE: Informed consent  was obtained from the patient following explanation of the procedure, risks, benefits and alternatives. The patient understands, agrees and consents for the procedure. All questions were addressed. A time out was performed prior to the initiation of the procedure. Maximal barrier sterile technique utilized including caps, mask, sterile gowns, sterile gloves, large sterile drape, hand hygiene, and chlorhexidine prep. Operators: HVernard Bradley Suttle Left wrist was prepped and draped in standard fashion. Preprocedure ultrasound evaluation of the left radial artery demonstrates appropriate size (diameter of 0.28 cm) for radial access. Local anesthesia was provided at the planned needle entry site with 1% lidocaine. Puncture of the left radial artery was performed under direct ultrasound visualization with a 21 gauge micropuncture needle. Permanent image was captured and stored in the record. A microwire was introduced without resistance. The needle which was then exchanged for a 4/5 French slender sheath. The wire and inner stylet were removed. The standard radial cocktail was slowly administered, diluted with autologous arterial blood. A 5 French MG2 Glidecatheter was then, under direct fluoroscopic visualization, directed over a leading Coons wire to the abdominal aorta. The right internal iliac artery was then selected. Internal iliac angiography was performed at a steep ipsilateral oblique. The prostatic artery was identified. A 1.9 [place] microcatheter and 0.014 inch Synchro Soft microwire were introduced and used to select the prostatic artery. Cone beam CT was then performed to confirm this location. After selective arteriogram, superior branch was catheterized. Embolization was performed with 400 micron hydropearl microspheres. The catheter was withdrawn and the common trunk with distal inferior vesicle branches, which was embolized with a 2 mm coil. Catheter was redirected into inferior branch of the  prostatic artery. After confirmatory arteriogram demonstrated good prostatic blush and no significant collateral branches, distal branches embolized with 400 micron hydropearl microspheres. Follow-up arteriogram shows cessation of flow in distal branches, no evident complication. The micro system was removed and the 5 French catheter was retracted to the aortic bifurcation. Catheter was exchanged for a 135 cm device. Over Coons wire, the catheter was directed into the left internal iliac artery. Internal iliac angiography was performed at a steep ipsilateral oblique. The prostatic artery is again seen arising from the proximal anterior division of the internal iliac artery. A 1.9 [place] Lambda microcatheter and 0.014 inch Synchro Soft microwire were introduced and used to select the left prostatic artery. Left prostate artery angiogram was performed which demonstrated parenchymal blushing in the area of the expected left hemi prostate. Cone beam CT was then performed to confirm this location. Next, 400 micron hydropearl microspheres were then used to embolize the left hemi prostate. The proximal prostatic artery was embolized to hemostasis. The catheter was retracted partially. Completion angiogram was performed which demonstrated no evidence of persistent perfusion to the left hemi prostate. The catheters were then removed. Additional standard radial cocktail was administered slowly. A TR band was applied to the  left wrist and the indwelling sheath was removed. The patient tolerated procedure well was transferred to the recovery area in good condition. FLUOROSCOPY: Radiation Exposure Index (as provided by the fluoroscopic device): 2508 mGy air Kerma COMPLICATIONS: COMPLICATIONS none IMPRESSION: 1. Technically successful bilateral prostatic artery embolization using 400 micron hydropearl microspheres. PLAN: Follow-up interventional radiology clinic 1 month for consideration of voiding trial. Electronically  Signed   By: Lucrezia Europe M.D.   On: 09/02/2021 18:15   IR Angiogram Selective Each Additional Vessel  Result Date: 09/02/2021 CLINICAL DATA:  BPH, lower urinary tract symptoms. See previous consultation EXAM: EXAM 1. ULTRASOUND-GUIDED ACCESS OF THE LEFT RADIAL ARTERY 2. BILATERAL INTERNAL ILIAC ANGIOGRAPHY 3. SELECTIVE BILATERAL PELVIC ANGIOGRAPHY 4. CONE BEAM CT ANGIOGRAM OF THE PELVIS 5. RIGHT PROSTATE ARTERY EMBOLIZATION WITH PARTICLES AND COIL 6.  LEFT  PROSTATE ARTERY EMBOLIZATION WITH PARTICLES 6. COMPLETION PELVIC ANGIOGRAPHY MEDICATIONS: Intravenous Fentanyl 167mg and Versed '3mg'$  were administered as conscious sedation during continuous monitoring of the patient's level of consciousness and physiological / cardiorespiratory status by the radiology RN, with a total moderate sedation time of 3 hours 30 minutes. TECHNIQUE: Informed consent was obtained from the patient following explanation of the procedure, risks, benefits and alternatives. The patient understands, agrees and consents for the procedure. All questions were addressed. A time out was performed prior to the initiation of the procedure. Maximal barrier sterile technique utilized including caps, mask, sterile gowns, sterile gloves, large sterile drape, hand hygiene, and chlorhexidine prep. Operators: HVernard Bradley Suttle Left wrist was prepped and draped in standard fashion. Preprocedure ultrasound evaluation of the left radial artery demonstrates appropriate size (diameter of 0.28 cm) for radial access. Local anesthesia was provided at the planned needle entry site with 1% lidocaine. Puncture of the left radial artery was performed under direct ultrasound visualization with a 21 gauge micropuncture needle. Permanent image was captured and stored in the record. A microwire was introduced without resistance. The needle which was then exchanged for a 4/5 French slender sheath. The wire and inner stylet were removed. The standard radial cocktail was  slowly administered, diluted with autologous arterial blood. A 5 French MG2 Glidecatheter was then, under direct fluoroscopic visualization, directed over a leading Coons wire to the abdominal aorta. The right internal iliac artery was then selected. Internal iliac angiography was performed at a steep ipsilateral oblique. The prostatic artery was identified. A 1.9 [place] microcatheter and 0.014 inch Synchro Soft microwire were introduced and used to select the prostatic artery. Cone beam CT was then performed to confirm this location. After selective arteriogram, superior branch was catheterized. Embolization was performed with 400 micron hydropearl microspheres. The catheter was withdrawn and the common trunk with distal inferior vesicle branches, which was embolized with a 2 mm coil. Catheter was redirected into inferior branch of the prostatic artery. After confirmatory arteriogram demonstrated good prostatic blush and no significant collateral branches, distal branches embolized with 400 micron hydropearl microspheres. Follow-up arteriogram shows cessation of flow in distal branches, no evident complication. The micro system was removed and the 5 French catheter was retracted to the aortic bifurcation. Catheter was exchanged for a 135 cm device. Over Coons wire, the catheter was directed into the left internal iliac artery. Internal iliac angiography was performed at a steep ipsilateral oblique. The prostatic artery is again seen arising from the proximal anterior division of the internal iliac artery. A 1.9 [place] Lambda microcatheter and 0.014 inch Synchro Soft microwire were introduced and used to select the left  prostatic artery. Left prostate artery angiogram was performed which demonstrated parenchymal blushing in the area of the expected left hemi prostate. Cone beam CT was then performed to confirm this location. Next, 400 micron hydropearl microspheres were then used to embolize  the left hemi prostate. The proximal prostatic artery was embolized to hemostasis. The catheter was retracted partially. Completion angiogram was performed which demonstrated no evidence of persistent perfusion to the left hemi prostate. The catheters were then removed. Additional standard radial cocktail was administered slowly. A TR band was applied to the left wrist and the indwelling sheath was removed. The patient tolerated procedure well was transferred to the recovery area in good condition. FLUOROSCOPY: Radiation Exposure Index (as provided by the fluoroscopic device): 2508 mGy air Kerma COMPLICATIONS: COMPLICATIONS none IMPRESSION: 1. Technically successful bilateral prostatic artery embolization using 400 micron hydropearl microspheres. PLAN: Follow-up interventional radiology clinic 1 month for consideration of voiding trial. Electronically Signed   By: Lucrezia Europe M.D.   On: 09/02/2021 18:15   IR 3D Independent Darreld Mclean  Result Date: 09/02/2021 CLINICAL DATA:  BPH, lower urinary tract symptoms. See previous consultation EXAM: EXAM 1. ULTRASOUND-GUIDED ACCESS OF THE LEFT RADIAL ARTERY 2. BILATERAL INTERNAL ILIAC ANGIOGRAPHY 3. SELECTIVE BILATERAL PELVIC ANGIOGRAPHY 4. CONE BEAM CT ANGIOGRAM OF THE PELVIS 5. RIGHT PROSTATE ARTERY EMBOLIZATION WITH PARTICLES AND COIL 6.  LEFT  PROSTATE ARTERY EMBOLIZATION WITH PARTICLES 6. COMPLETION PELVIC ANGIOGRAPHY MEDICATIONS: Intravenous Fentanyl 183mg and Versed '3mg'$  were administered as conscious sedation during continuous monitoring of the patient's level of consciousness and physiological / cardiorespiratory status by the radiology RN, with a total moderate sedation time of 3 hours 30 minutes. TECHNIQUE: Informed consent was obtained from the patient following explanation of the procedure, risks, benefits and alternatives. The patient understands, agrees and consents for the procedure. All questions were addressed. A time out was performed prior to the initiation  of the procedure. Maximal barrier sterile technique utilized including caps, mask, sterile gowns, sterile gloves, large sterile drape, hand hygiene, and chlorhexidine prep. Operators: HVernard Bradley Suttle Left wrist was prepped and draped in standard fashion. Preprocedure ultrasound evaluation of the left radial artery demonstrates appropriate size (diameter of 0.28 cm) for radial access. Local anesthesia was provided at the planned needle entry site with 1% lidocaine. Puncture of the left radial artery was performed under direct ultrasound visualization with a 21 gauge micropuncture needle. Permanent image was captured and stored in the record. A microwire was introduced without resistance. The needle which was then exchanged for a 4/5 French slender sheath. The wire and inner stylet were removed. The standard radial cocktail was slowly administered, diluted with autologous arterial blood. A 5 French MG2 Glidecatheter was then, under direct fluoroscopic visualization, directed over a leading Coons wire to the abdominal aorta. The right internal iliac artery was then selected. Internal iliac angiography was performed at a steep ipsilateral oblique. The prostatic artery was identified. A 1.9 [place] microcatheter and 0.014 inch Synchro Soft microwire were introduced and used to select the prostatic artery. Cone beam CT was then performed to confirm this location. After selective arteriogram, superior branch was catheterized. Embolization was performed with 400 micron hydropearl microspheres. The catheter was withdrawn and the common trunk with distal inferior vesicle branches, which was embolized with a 2 mm coil. Catheter was redirected into inferior branch of the prostatic artery. After confirmatory arteriogram demonstrated good prostatic blush and no significant collateral branches, distal branches embolized with 400 micron hydropearl microspheres. Follow-up arteriogram shows cessation of  flow in distal  branches, no evident complication. The micro system was removed and the 5 French catheter was retracted to the aortic bifurcation. Catheter was exchanged for a 135 cm device. Over Coons wire, the catheter was directed into the left internal iliac artery. Internal iliac angiography was performed at a steep ipsilateral oblique. The prostatic artery is again seen arising from the proximal anterior division of the internal iliac artery. A 1.9 [place] Progreat Lambda microcatheter and 0.014 inch Synchro Soft microwire were introduced and used to select the left prostatic artery. Left prostate artery angiogram was performed which demonstrated parenchymal blushing in the area of the expected left hemi prostate. Cone beam CT was then performed to confirm this location. Next, 400 micron hydropearl microspheres were then used to embolize the left hemi prostate. The proximal prostatic artery was embolized to hemostasis. The catheter was retracted partially. Completion angiogram was performed which demonstrated no evidence of persistent perfusion to the left hemi prostate. The catheters were then removed. Additional standard radial cocktail was administered slowly. A TR band was applied to the left wrist and the indwelling sheath was removed. The patient tolerated procedure well was transferred to the recovery area in good condition. FLUOROSCOPY: Radiation Exposure Index (as provided by the fluoroscopic device): 2508 mGy air Kerma COMPLICATIONS: COMPLICATIONS none IMPRESSION: 1. Technically successful bilateral prostatic artery embolization using 400 micron hydropearl microspheres. PLAN: Follow-up interventional radiology clinic 1 month for consideration of voiding trial. Electronically Signed   By: Lucrezia Europe M.D.   On: 09/02/2021 18:15   IR Angiogram Selective Each Additional Vessel  Result Date: 09/02/2021 CLINICAL DATA:  BPH, lower urinary tract symptoms. See previous consultation EXAM: EXAM 1. ULTRASOUND-GUIDED ACCESS  OF THE LEFT RADIAL ARTERY 2. BILATERAL INTERNAL ILIAC ANGIOGRAPHY 3. SELECTIVE BILATERAL PELVIC ANGIOGRAPHY 4. CONE BEAM CT ANGIOGRAM OF THE PELVIS 5. RIGHT PROSTATE ARTERY EMBOLIZATION WITH PARTICLES AND COIL 6.  LEFT  PROSTATE ARTERY EMBOLIZATION WITH PARTICLES 6. COMPLETION PELVIC ANGIOGRAPHY MEDICATIONS: Intravenous Fentanyl 1105mg and Versed '3mg'$  were administered as conscious sedation during continuous monitoring of the patient's level of consciousness and physiological / cardiorespiratory status by the radiology RN, with a total moderate sedation time of 3 hours 30 minutes. TECHNIQUE: Informed consent was obtained from the patient following explanation of the procedure, risks, benefits and alternatives. The patient understands, agrees and consents for the procedure. All questions were addressed. A time out was performed prior to the initiation of the procedure. Maximal barrier sterile technique utilized including caps, mask, sterile gowns, sterile gloves, large sterile drape, hand hygiene, and chlorhexidine prep. Operators: HVernard Bradley Suttle Left wrist was prepped and draped in standard fashion. Preprocedure ultrasound evaluation of the left radial artery demonstrates appropriate size (diameter of 0.28 cm) for radial access. Local anesthesia was provided at the planned needle entry site with 1% lidocaine. Puncture of the left radial artery was performed under direct ultrasound visualization with a 21 gauge micropuncture needle. Permanent image was captured and stored in the record. A microwire was introduced without resistance. The needle which was then exchanged for a 4/5 French slender sheath. The wire and inner stylet were removed. The standard radial cocktail was slowly administered, diluted with autologous arterial blood. A 5 French MG2 Glidecatheter was then, under direct fluoroscopic visualization, directed over a leading Coons wire to the abdominal aorta. The right internal iliac artery was then  selected. Internal iliac angiography was performed at a steep ipsilateral oblique. The prostatic artery was identified. A 1.9 [place] Lambda microcatheter and 0.014  inch Synchro Soft microwire were introduced and used to select the prostatic artery. Cone beam CT was then performed to confirm this location. After selective arteriogram, superior branch was catheterized. Embolization was performed with 400 micron hydropearl microspheres. The catheter was withdrawn and the common trunk with distal inferior vesicle branches, which was embolized with a 2 mm coil. Catheter was redirected into inferior branch of the prostatic artery. After confirmatory arteriogram demonstrated good prostatic blush and no significant collateral branches, distal branches embolized with 400 micron hydropearl microspheres. Follow-up arteriogram shows cessation of flow in distal branches, no evident complication. The micro system was removed and the 5 French catheter was retracted to the aortic bifurcation. Catheter was exchanged for a 135 cm device. Over Coons wire, the catheter was directed into the left internal iliac artery. Internal iliac angiography was performed at a steep ipsilateral oblique. The prostatic artery is again seen arising from the proximal anterior division of the internal iliac artery. A 1.9 [place] Progreat Lambda microcatheter and 0.014 inch Synchro Soft microwire were introduced and used to select the left prostatic artery. Left prostate artery angiogram was performed which demonstrated parenchymal blushing in the area of the expected left hemi prostate. Cone beam CT was then performed to confirm this location. Next, 400 micron hydropearl microspheres were then used to embolize the left hemi prostate. The proximal prostatic artery was embolized to hemostasis. The catheter was retracted partially. Completion angiogram was performed which demonstrated no evidence of persistent perfusion to the left hemi prostate.  The catheters were then removed. Additional standard radial cocktail was administered slowly. A TR band was applied to the left wrist and the indwelling sheath was removed. The patient tolerated procedure well was transferred to the recovery area in good condition. FLUOROSCOPY: Radiation Exposure Index (as provided by the fluoroscopic device): 2508 mGy air Kerma COMPLICATIONS: COMPLICATIONS none IMPRESSION: 1. Technically successful bilateral prostatic artery embolization using 400 micron hydropearl microspheres. PLAN: Follow-up interventional radiology clinic 1 month for consideration of voiding trial. Electronically Signed   By: Lucrezia Europe M.D.   On: 09/02/2021 18:15    Labs:  CBC: Recent Labs    08/20/21 0410 08/21/21 0500 09/02/21 0659 09/03/21 1233  WBC 8.0 9.6 7.3 11.6*  HGB 14.5 13.9 14.2 14.2  HCT 42.1 41.0 41.2 41.5  PLT 200 183 197 201    COAGS: Recent Labs    07/22/21 0001 09/02/21 0659  INR 1.0 1.0    BMP: Recent Labs    08/20/21 0410 08/21/21 0500 09/02/21 0659 09/03/21 1233  NA 137 140 134* 132*  K 4.0 4.0 3.9 4.1  CL 107 109 103 101  CO2 '23 24 23 24  '$ GLUCOSE 105* 107* 126* 102*  BUN '20 18 19 19  '$ CALCIUM 9.4 9.0 9.2 9.0  CREATININE 1.04 0.83 0.94 1.04  GFRNONAA >60 >60 >60 >60    LIVER FUNCTION TESTS: Recent Labs    07/22/21 0000 08/20/21 0410 08/21/21 0500 09/03/21 1233  BILITOT 0.8 0.5 0.7 0.8  AST '15 19 16 15  '$ ALT '15 17 17 15  '$ ALKPHOS 73 75 69 79  PROT 6.4* 7.0 6.4* 6.6  ALBUMIN 3.7 4.0 3.5 3.7    Assessment and Plan:  S/P Prostate embolization yesterday by Dr. Vernard Bradley.  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  Recommend Toradol 30 mg IV and continue with Advil 200 mg q 6  hours scheduled x 3 days.  Discussed with Dr. Anselm Pancoast and he is in agreement.   Electronically Signed: Murrell Redden,  PA-C 09/03/2021, 3:23 PM    I spent a total of 35 Minutes at the the patient's bedside AND on the patient's hospital floor or unit, greater than 50% of which was counseling/coordinating care for prostate embo follow up.

## 2021-09-04 ENCOUNTER — Telehealth: Payer: Self-pay | Admitting: Physician Assistant

## 2021-09-04 NOTE — Telephone Encounter (Signed)
  Called to check on Mr. Weinel to see if the Toradol helped with pain.  Spoke with Sharee Pimple. She stated he has had no pain since about 7 pm last night. He rested well over night and is currently pain free.  Maximum Reiland S Narya Beavin PA-C 09/04/2021 9:45 AM

## 2021-09-29 ENCOUNTER — Ambulatory Visit
Admission: RE | Admit: 2021-09-29 | Discharge: 2021-09-29 | Disposition: A | Discharge status: Home / Self Care | Payer: Medicare Other | Source: Ambulatory Visit | Attending: Radiology | Admitting: Radiology

## 2021-09-29 DIAGNOSIS — N401 Enlarged prostate with lower urinary tract symptoms: Secondary | ICD-10-CM

## 2021-09-29 HISTORY — PX: IR RADIOLOGIST EVAL & MGMT: IMG5224

## 2021-09-29 NOTE — Progress Notes (Signed)
Patient ID: Ricky Bradley, male   DOB: 12/24/1933, 88 y.o.   MRN: [id number]       Chief Complaint: Patient was consulted remotely today ([place]) for Follow-up prostate artery embolization at the request of Omohundro,Jennifer C.    Referring Physician(s): Link Snuffer, MD  History of Present Illness: Ricky Bradley is a 88 y.o. male with a documented history of  hematuria and BPH going back greater than 10 years.  This had progressed recently such that October 2022 the patient   required permanent bladder catheterization. Cystoscopy at that time was reportedly negative.  The catheter is changed monthly. December 2022 he had a fairly severe urinary tract infection with prolonged recovery resulting in weight loss of about 20 pounds and some persistent low energy. 07/22/2021 he had recurrence of fairly severe gross hematuria   which led to ED visit, ultimately responded to bladder irrigation and Foley catheter upsizing to 22 [place]. He currently experiences no hematuria.  He was offered TURP for symptom control and to allow removal of the Foley catheter, but was reluctant to proceed due to concerns about anesthesia long-term complications.  Preprocedure IPSS score 15. 1623 consulted regarding   prostate artery embolization and/or suprapubic catheter placement for symptom control 09/02/2021 patient underwent technically successful transradial bilateral prostate artery embolization.  He was discharged home later the same day in good condition. 09/03/2021 patient had pelvic and penile pain, was unable to reach our office and presented to the ED.  This responded to conservative treatments and Toradol. Since then, he has had no significant residual or recurrent pelvic pain.  He recently had scheduled bladder catheter exchange, with downsizing to a 32 [place], and noted a small amount of postprocedure hematuria which has since resolved..     Past Medical History:  Diagnosis Date   Enlarged prostate    GERD  (gastroesophageal reflux disease)    High cholesterol    High cholesterol    Hypertension    no longer takes bp med-resolved.   RBBB     Past Surgical History:  Procedure Laterality Date   ANTERIOR CERVICAL DECOMP/DISCECTOMY FUSION  01/16/2011   Procedure: ANTERIOR CERVICAL DECOMPRESSION/DISCECTOMY FUSION 1 LEVEL;  Surgeon: Ophelia Charter;  Location: [place] [facility];  Service: Neurosurgery;  Laterality: N/A;  Cervical six-seven  Anterior Cervical Decomp[ression Fusion   ANTERIOR CERVICAL DECOMP/DISCECTOMY FUSION  01/23/2011   Procedure: ANTERIOR CERVICAL DECOMPRESSION/DISCECTOMY FUSION 1 LEVEL/HARDWARE REMOVAL;  Surgeon: Ophelia Charter;  Location: [place] [facility];  Service: Neurosurgery;  Laterality: N/A;  Evacuation of Cervical Six-Seven Hematoma   BACK SURGERY     CHOLECYSTECTOMY  11/01/10   EYE SURGERY  2008   IR 3D INDEPENDENT WKST  09/02/2021   IR ANGIOGRAM PELVIS SELECTIVE OR SUPRASELECTIVE  09/02/2021   IR ANGIOGRAM SELECTIVE EACH ADDITIONAL VESSEL  09/02/2021   IR ANGIOGRAM SELECTIVE EACH ADDITIONAL VESSEL  09/02/2021   IR EMBO TUMOR ORGAN ISCHEMIA INFARCT INC GUIDE ROADMAPPING  09/02/2021   IR US GUIDE VASC [employer] LEFT  09/02/2021   JOINT REPLACEMENT  2001  and 2002   both knees   laminectomy  08/2010   POSTERIOR CERVICAL FUSION/FORAMINOTOMY  11/06/2011   Procedure: POSTERIOR CERVICAL FUSION/FORAMINOTOMY LEVEL 1;  Surgeon: Ophelia Charter, MD;  Location: [place] [facility];  Service: Neurosurgery;  Laterality: N/A;  C67 laminectomy with posterior cervical fusion with instrumentation   REPLACEMENT TOTAL KNEE      Allergies: Vicodin [hydrocodone-acetaminophen]  Medications: Prior to Admission medications   Medication Sig Start  Date End Date Taking? Authorizing Provider  acetaminophen (TYLENOL) 500 MG tablet Take 1,000 mg by mouth as needed for headache ('stuffy nose').    [provider]  amLODipine (NORVASC) 5 MG tablet Take 5 mg by mouth every evening. 09/20/20   [provider]  aspirin 81 MG EC tablet Take 81 mg by mouth daily.     [provider]  atorvastatin (LIPITOR) 10 MG tablet Take 10 mg by mouth daily. 01/07/21   [provider]  benazepril (LOTENSIN) 10 MG tablet Take 10 mg by mouth daily. 12/18/20   [provider]  bisacodyl (DULCOLAX) 5 MG EC tablet Take 1 tablet (5 mg total) by mouth daily as needed for moderate constipation. 09/02/21   Jacqualine Mau, NP  BISACODYL 5 MG EC tablet Take 10 mg by mouth as needed (constipation). Only take if constipated for 3 days 04/27/21   [provider]  cetirizine (ZYRTEC) 10 MG tablet Take 10 mg by mouth at bedtime. 11/17/20   [provider]  citalopram (CELEXA) 20 MG tablet Take 20 mg by mouth daily.    [provider]  cyclobenzaprine (FLEXERIL) 5 MG tablet Take 1 tablet (5 mg total) by mouth 3 (three) times daily. 08/22/21   Nita Sells, MD  Docusate Calcium (STOOL SOFTENER PO) Take 2 capsules by mouth at bedtime.    [provider]  finasteride (PROSCAR) 5 MG tablet Take 5 mg by mouth daily. 02/10/21   [provider]  fluticasone (FLONASE) 50 MCG/ACT nasal spray Place 2 sprays into both nostrils daily.    [provider]  meloxicam (MOBIC) 7.5 MG tablet Take 7.5 mg by mouth at bedtime. 02/24/21   [provider]  Olopatadine HCl 0.2 % SOLN Place 1 drop into both eyes daily. 11/26/18   [provider]  oxyCODONE-acetaminophen (PERCOCET/ROXICET) 5-325 MG tablet Take 1 tablet by mouth every 6 (six) hours as needed for severe pain. 08/22/21   Nita Sells, MD  polyethylene glycol (MIRALAX / GLYCOLAX) 17 g packet Take 17 g by mouth at bedtime.    [provider]  Probiotic Product (PROBIOTIC PO) Take 1 tablet by mouth daily.    [provider]  Simethicone (GAS-X PO) Take 1 tablet by mouth as needed (gas).    [provider]  Tamsulosin HCl (FLOMAX) 0.4 MG CAPS Take  0.4 mg by mouth 2 (two) times daily.    [provider]  traZODone (DESYREL) 50 MG tablet Take 75 mg by mouth at bedtime. 07/12/21   [provider]     No family history on file.  Social History   Socioeconomic History   Marital status: Married    Spouse name: Not on file   Number of children: Not on file   Years of education: Not on file   Highest education level: Not on file  Occupational History   Not on file  Tobacco Use   Smoking status: Former    Packs/day: 0.50    Years: 2.00    Total pack years: 1.00    Types: Cigarettes    Quit date: 02/14/1971    Years since quitting: 50.6   Smokeless tobacco: Never  Vaping Use   Vaping Use: Never used  Substance and Sexual Activity   Alcohol use: No   Drug use: No   Sexual activity: Never    Birth control/protection: None  Other Topics Concern   Not on file  Social History Narrative   Not  on file   Social Determinants of Health   Financial Resource Strain: Not on file  Food Insecurity: Not on file  Transportation Needs: Not on file  Physical Activity: Not on file  Stress: Not on file  Social Connections: Not on file    ECOG Status: 1 - Symptomatic but completely ambulatory  Review of Systems  Review of Systems: A 12 point ROS discussed and pertinent positives are indicated in the HPI above.  All other systems are negative.      Physical Exam No direct physical exam was performed (except for noted visual exam findings with Video Visits).     Vital Signs: There were no vitals taken for this visit.  Imaging: IR EMBO TUMOR ORGAN ISCHEMIA INFARCT INC GUIDE ROADMAPPING  Result Date: 09/02/2021 CLINICAL DATA:  BPH, lower urinary tract symptoms. See previous consultation EXAM: EXAM 1. ULTRASOUND-GUIDED ACCESS OF THE LEFT RADIAL ARTERY 2. BILATERAL INTERNAL ILIAC ANGIOGRAPHY 3. SELECTIVE BILATERAL PELVIC ANGIOGRAPHY 4. CONE BEAM CT ANGIOGRAM OF THE PELVIS 5. RIGHT PROSTATE ARTERY EMBOLIZATION WITH  PARTICLES AND COIL 6.  LEFT  PROSTATE ARTERY EMBOLIZATION WITH PARTICLES 6. COMPLETION PELVIC ANGIOGRAPHY MEDICATIONS: Intravenous Fentanyl 132mg and Versed '3mg'$  were administered as conscious sedation during continuous monitoring of the patient's level of consciousness and physiological / cardiorespiratory status by the radiology RN, with a total moderate sedation time of 3 hours 30 minutes. TECHNIQUE: Informed consent was obtained from the patient following explanation of the procedure, risks, benefits and alternatives. The patient understands, agrees and consents for the procedure. All questions were addressed. A time out was performed prior to the initiation of the procedure. Maximal barrier sterile technique utilized including caps, mask, sterile gowns, sterile gloves, large sterile drape, hand hygiene, and chlorhexidine prep. Operators: HVernard Gambles Suttle Left wrist was prepped and draped in standard fashion. Preprocedure ultrasound evaluation of the left radial artery demonstrates appropriate size (diameter of 0.28 cm) for radial access. Local anesthesia was provided at the planned needle entry site with 1% lidocaine. Puncture of the left radial artery was performed under direct ultrasound visualization with a 21 gauge micropuncture needle. Permanent image was captured and stored in the record. A microwire was introduced without resistance. The needle which was then exchanged for a 4/5 French slender sheath. The wire and inner stylet were removed. The standard radial cocktail was slowly administered, diluted with autologous arterial blood. A 5 French MG2 Glidecatheter was then, under direct fluoroscopic visualization, directed over a leading Coons wire to the abdominal aorta. The right internal iliac artery was then selected. Internal iliac angiography was performed at a steep ipsilateral oblique. The prostatic artery was identified. A 1.9 [place] Lambda microcatheter and 0.014 inch Synchro Soft  microwire were introduced and used to select the prostatic artery. Cone beam CT was then performed to confirm this location. After selective arteriogram, superior branch was catheterized. Embolization was performed with 400 micron hydropearl microspheres. The catheter was withdrawn and the common trunk with distal inferior vesicle branches, which was embolized with a 2 mm coil. Catheter was redirected into inferior branch of the prostatic artery. After confirmatory arteriogram demonstrated good prostatic blush and no significant collateral branches, distal branches embolized with 400 micron hydropearl microspheres. Follow-up arteriogram shows cessation of flow in distal branches, no evident complication. The micro system was removed and the 5 French catheter was retracted to the aortic bifurcation. Catheter was exchanged for a 135 cm device. Over Coons wire, the catheter was directed into the left internal iliac artery. Internal iliac  angiography was performed at a steep ipsilateral oblique. The prostatic artery is again seen arising from the proximal anterior division of the internal iliac artery. A 1.9 [place] Progreat Lambda microcatheter and 0.014 inch Synchro Soft microwire were introduced and used to select the left prostatic artery. Left prostate artery angiogram was performed which demonstrated parenchymal blushing in the area of the expected left hemi prostate. Cone beam CT was then performed to confirm this location. Next, 400 micron hydropearl microspheres were then used to embolize the left hemi prostate. The proximal prostatic artery was embolized to hemostasis. The catheter was retracted partially. Completion angiogram was performed which demonstrated no evidence of persistent perfusion to the left hemi prostate. The catheters were then removed. Additional standard radial cocktail was administered slowly. A TR band was applied to the left wrist and the indwelling sheath was removed. The patient  tolerated procedure well was transferred to the recovery area in good condition. FLUOROSCOPY: Radiation Exposure Index (as provided by the fluoroscopic device): 2508 mGy air Kerma COMPLICATIONS: COMPLICATIONS none IMPRESSION: 1. Technically successful bilateral prostatic artery embolization using 400 micron hydropearl microspheres. PLAN: Follow-up interventional radiology clinic 1 month for consideration of voiding trial. Electronically Signed   By: Lucrezia Europe M.D.   On: 09/02/2021 18:15   IR US Guide Vasc Access Left  Result Date: 09/02/2021 CLINICAL DATA:  BPH, lower urinary tract symptoms. See previous consultation EXAM: EXAM 1. ULTRASOUND-GUIDED ACCESS OF THE LEFT RADIAL ARTERY 2. BILATERAL INTERNAL ILIAC ANGIOGRAPHY 3. SELECTIVE BILATERAL PELVIC ANGIOGRAPHY 4. CONE BEAM CT ANGIOGRAM OF THE PELVIS 5. RIGHT PROSTATE ARTERY EMBOLIZATION WITH PARTICLES AND COIL 6.  LEFT  PROSTATE ARTERY EMBOLIZATION WITH PARTICLES 6. COMPLETION PELVIC ANGIOGRAPHY MEDICATIONS: Intravenous Fentanyl 157mg and Versed '3mg'$  were administered as conscious sedation during continuous monitoring of the patient's level of consciousness and physiological / cardiorespiratory status by the radiology RN, with a total moderate sedation time of 3 hours 30 minutes. TECHNIQUE: Informed consent was obtained from the patient following explanation of the procedure, risks, benefits and alternatives. The patient understands, agrees and consents for the procedure. All questions were addressed. A time out was performed prior to the initiation of the procedure. Maximal barrier sterile technique utilized including caps, mask, sterile gowns, sterile gloves, large sterile drape, hand hygiene, and chlorhexidine prep. Operators: HVernard Gambles Suttle Left wrist was prepped and draped in standard fashion. Preprocedure ultrasound evaluation of the left radial artery demonstrates appropriate size (diameter of 0.28 cm) for radial access. Local anesthesia was provided  at the planned needle entry site with 1% lidocaine. Puncture of the left radial artery was performed under direct ultrasound visualization with a 21 gauge micropuncture needle. Permanent image was captured and stored in the record. A microwire was introduced without resistance. The needle which was then exchanged for a 4/5 French slender sheath. The wire and inner stylet were removed. The standard radial cocktail was slowly administered, diluted with autologous arterial blood. A 5 French MG2 Glidecatheter was then, under direct fluoroscopic visualization, directed over a leading Coons wire to the abdominal aorta. The right internal iliac artery was then selected. Internal iliac angiography was performed at a steep ipsilateral oblique. The prostatic artery was identified. A 1.9 [place] Lambda microcatheter and 0.014 inch Synchro Soft microwire were introduced and used to select the prostatic artery. Cone beam CT was then performed to confirm this location. After selective arteriogram, superior branch was catheterized. Embolization was performed with 400 micron hydropearl microspheres. The catheter was withdrawn and the common trunk with  distal inferior vesicle branches, which was embolized with a 2 mm coil. Catheter was redirected into inferior branch of the prostatic artery. After confirmatory arteriogram demonstrated good prostatic blush and no significant collateral branches, distal branches embolized with 400 micron hydropearl microspheres. Follow-up arteriogram shows cessation of flow in distal branches, no evident complication. The micro system was removed and the 5 French catheter was retracted to the aortic bifurcation. Catheter was exchanged for a 135 cm device. Over Coons wire, the catheter was directed into the left internal iliac artery. Internal iliac angiography was performed at a steep ipsilateral oblique. The prostatic artery is again seen arising from the proximal anterior division of the  internal iliac artery. A 1.9 [place] Progreat Lambda microcatheter and 0.014 inch Synchro Soft microwire were introduced and used to select the left prostatic artery. Left prostate artery angiogram was performed which demonstrated parenchymal blushing in the area of the expected left hemi prostate. Cone beam CT was then performed to confirm this location. Next, 400 micron hydropearl microspheres were then used to embolize the left hemi prostate. The proximal prostatic artery was embolized to hemostasis. The catheter was retracted partially. Completion angiogram was performed which demonstrated no evidence of persistent perfusion to the left hemi prostate. The catheters were then removed. Additional standard radial cocktail was administered slowly. A TR band was applied to the left wrist and the indwelling sheath was removed. The patient tolerated procedure well was transferred to the recovery area in good condition. FLUOROSCOPY: Radiation Exposure Index (as provided by the fluoroscopic device): 2508 mGy air Kerma COMPLICATIONS: COMPLICATIONS none IMPRESSION: 1. Technically successful bilateral prostatic artery embolization using 400 micron hydropearl microspheres. PLAN: Follow-up interventional radiology clinic 1 month for consideration of voiding trial. Electronically Signed   By: Lucrezia Europe M.D.   On: 09/02/2021 18:15   IR Angiogram Pelvis Selective Or Supraselective  Result Date: 09/02/2021 CLINICAL DATA:  BPH, lower urinary tract symptoms. See previous consultation EXAM: EXAM 1. ULTRASOUND-GUIDED ACCESS OF THE LEFT RADIAL ARTERY 2. BILATERAL INTERNAL ILIAC ANGIOGRAPHY 3. SELECTIVE BILATERAL PELVIC ANGIOGRAPHY 4. CONE BEAM CT ANGIOGRAM OF THE PELVIS 5. RIGHT PROSTATE ARTERY EMBOLIZATION WITH PARTICLES AND COIL 6.  LEFT  PROSTATE ARTERY EMBOLIZATION WITH PARTICLES 6. COMPLETION PELVIC ANGIOGRAPHY MEDICATIONS: Intravenous Fentanyl 136mg and Versed '3mg'$  were administered as conscious sedation during continuous  monitoring of the patient's level of consciousness and physiological / cardiorespiratory status by the radiology RN, with a total moderate sedation time of 3 hours 30 minutes. TECHNIQUE: Informed consent was obtained from the patient following explanation of the procedure, risks, benefits and alternatives. The patient understands, agrees and consents for the procedure. All questions were addressed. A time out was performed prior to the initiation of the procedure. Maximal barrier sterile technique utilized including caps, mask, sterile gowns, sterile gloves, large sterile drape, hand hygiene, and chlorhexidine prep. Operators: HVernard Gambles Suttle Left wrist was prepped and draped in standard fashion. Preprocedure ultrasound evaluation of the left radial artery demonstrates appropriate size (diameter of 0.28 cm) for radial access. Local anesthesia was provided at the planned needle entry site with 1% lidocaine. Puncture of the left radial artery was performed under direct ultrasound visualization with a 21 gauge micropuncture needle. Permanent image was captured and stored in the record. A microwire was introduced without resistance. The needle which was then exchanged for a 4/5 French slender sheath. The wire and inner stylet were removed. The standard radial cocktail was slowly administered, diluted with autologous arterial blood. A 5 French MG2 Glidecatheter was  then, under direct fluoroscopic visualization, directed over a leading Coons wire to the abdominal aorta. The right internal iliac artery was then selected. Internal iliac angiography was performed at a steep ipsilateral oblique. The prostatic artery was identified. A 1.9 [place] Progreat Lambda microcatheter and 0.014 inch Synchro Soft microwire were introduced and used to select the prostatic artery. Cone beam CT was then performed to confirm this location. After selective arteriogram, superior branch was catheterized. Embolization was performed with 400  micron hydropearl microspheres. The catheter was withdrawn and the common trunk with distal inferior vesicle branches, which was embolized with a 2 mm coil. Catheter was redirected into inferior branch of the prostatic artery. After confirmatory arteriogram demonstrated good prostatic blush and no significant collateral branches, distal branches embolized with 400 micron hydropearl microspheres. Follow-up arteriogram shows cessation of flow in distal branches, no evident complication. The micro system was removed and the 5 French catheter was retracted to the aortic bifurcation. Catheter was exchanged for a 135 cm device. Over Coons wire, the catheter was directed into the left internal iliac artery. Internal iliac angiography was performed at a steep ipsilateral oblique. The prostatic artery is again seen arising from the proximal anterior division of the internal iliac artery. A 1.9 [place] Progreat Lambda microcatheter and 0.014 inch Synchro Soft microwire were introduced and used to select the left prostatic artery. Left prostate artery angiogram was performed which demonstrated parenchymal blushing in the area of the expected left hemi prostate. Cone beam CT was then performed to confirm this location. Next, 400 micron hydropearl microspheres were then used to embolize the left hemi prostate. The proximal prostatic artery was embolized to hemostasis. The catheter was retracted partially. Completion angiogram was performed which demonstrated no evidence of persistent perfusion to the left hemi prostate. The catheters were then removed. Additional standard radial cocktail was administered slowly. A TR band was applied to the left wrist and the indwelling sheath was removed. The patient tolerated procedure well was transferred to the recovery area in good condition. FLUOROSCOPY: Radiation Exposure Index (as provided by the fluoroscopic device): 2508 mGy air Kerma COMPLICATIONS: COMPLICATIONS none IMPRESSION: 1.  Technically successful bilateral prostatic artery embolization using 400 micron hydropearl microspheres. PLAN: Follow-up interventional radiology clinic 1 month for consideration of voiding trial. Electronically Signed   By: Lucrezia Europe M.D.   On: 09/02/2021 18:15   IR Angiogram Selective Each Additional Vessel  Result Date: 09/02/2021 CLINICAL DATA:  BPH, lower urinary tract symptoms. See previous consultation EXAM: EXAM 1. ULTRASOUND-GUIDED ACCESS OF THE LEFT RADIAL ARTERY 2. BILATERAL INTERNAL ILIAC ANGIOGRAPHY 3. SELECTIVE BILATERAL PELVIC ANGIOGRAPHY 4. CONE BEAM CT ANGIOGRAM OF THE PELVIS 5. RIGHT PROSTATE ARTERY EMBOLIZATION WITH PARTICLES AND COIL 6.  LEFT  PROSTATE ARTERY EMBOLIZATION WITH PARTICLES 6. COMPLETION PELVIC ANGIOGRAPHY MEDICATIONS: Intravenous Fentanyl 188mg and Versed '3mg'$  were administered as conscious sedation during continuous monitoring of the patient's level of consciousness and physiological / cardiorespiratory status by the radiology RN, with a total moderate sedation time of 3 hours 30 minutes. TECHNIQUE: Informed consent was obtained from the patient following explanation of the procedure, risks, benefits and alternatives. The patient understands, agrees and consents for the procedure. All questions were addressed. A time out was performed prior to the initiation of the procedure. Maximal barrier sterile technique utilized including caps, mask, sterile gowns, sterile gloves, large sterile drape, hand hygiene, and chlorhexidine prep. Operators: HVernard Gambles Suttle Left wrist was prepped and draped in standard fashion. Preprocedure ultrasound evaluation of the left radial artery  demonstrates appropriate size (diameter of 0.28 cm) for radial access. Local anesthesia was provided at the planned needle entry site with 1% lidocaine. Puncture of the left radial artery was performed under direct ultrasound visualization with a 21 gauge micropuncture needle. Permanent image was captured and  stored in the record. A microwire was introduced without resistance. The needle which was then exchanged for a 4/5 French slender sheath. The wire and inner stylet were removed. The standard radial cocktail was slowly administered, diluted with autologous arterial blood. A 5 French MG2 Glidecatheter was then, under direct fluoroscopic visualization, directed over a leading Coons wire to the abdominal aorta. The right internal iliac artery was then selected. Internal iliac angiography was performed at a steep ipsilateral oblique. The prostatic artery was identified. A 1.9 [place] Progreat Lambda microcatheter and 0.014 inch Synchro Soft microwire were introduced and used to select the prostatic artery. Cone beam CT was then performed to confirm this location. After selective arteriogram, superior branch was catheterized. Embolization was performed with 400 micron hydropearl microspheres. The catheter was withdrawn and the common trunk with distal inferior vesicle branches, which was embolized with a 2 mm coil. Catheter was redirected into inferior branch of the prostatic artery. After confirmatory arteriogram demonstrated good prostatic blush and no significant collateral branches, distal branches embolized with 400 micron hydropearl microspheres. Follow-up arteriogram shows cessation of flow in distal branches, no evident complication. The micro system was removed and the 5 French catheter was retracted to the aortic bifurcation. Catheter was exchanged for a 135 cm device. Over Coons wire, the catheter was directed into the left internal iliac artery. Internal iliac angiography was performed at a steep ipsilateral oblique. The prostatic artery is again seen arising from the proximal anterior division of the internal iliac artery. A 1.9 [place] Progreat Lambda microcatheter and 0.014 inch Synchro Soft microwire were introduced and used to select the left prostatic artery. Left prostate artery angiogram was performed  which demonstrated parenchymal blushing in the area of the expected left hemi prostate. Cone beam CT was then performed to confirm this location. Next, 400 micron hydropearl microspheres were then used to embolize the left hemi prostate. The proximal prostatic artery was embolized to hemostasis. The catheter was retracted partially. Completion angiogram was performed which demonstrated no evidence of persistent perfusion to the left hemi prostate. The catheters were then removed. Additional standard radial cocktail was administered slowly. A TR band was applied to the left wrist and the indwelling sheath was removed. The patient tolerated procedure well was transferred to the recovery area in good condition. FLUOROSCOPY: Radiation Exposure Index (as provided by the fluoroscopic device): 2508 mGy air Kerma COMPLICATIONS: COMPLICATIONS none IMPRESSION: 1. Technically successful bilateral prostatic artery embolization using 400 micron hydropearl microspheres. PLAN: Follow-up interventional radiology clinic 1 month for consideration of voiding trial. Electronically Signed   By: Lucrezia Europe M.D.   On: 09/02/2021 18:15   IR 3D Independent Darreld Mclean  Result Date: 09/02/2021 CLINICAL DATA:  BPH, lower urinary tract symptoms. See previous consultation EXAM: EXAM 1. ULTRASOUND-GUIDED ACCESS OF THE LEFT RADIAL ARTERY 2. BILATERAL INTERNAL ILIAC ANGIOGRAPHY 3. SELECTIVE BILATERAL PELVIC ANGIOGRAPHY 4. CONE BEAM CT ANGIOGRAM OF THE PELVIS 5. RIGHT PROSTATE ARTERY EMBOLIZATION WITH PARTICLES AND COIL 6.  LEFT  PROSTATE ARTERY EMBOLIZATION WITH PARTICLES 6. COMPLETION PELVIC ANGIOGRAPHY MEDICATIONS: Intravenous Fentanyl 150mg and Versed '3mg'$  were administered as conscious sedation during continuous monitoring of the patient's level of consciousness and physiological / cardiorespiratory status by the radiology RN, with a  total moderate sedation time of 3 hours 30 minutes. TECHNIQUE: Informed consent was obtained from the patient  following explanation of the procedure, risks, benefits and alternatives. The patient understands, agrees and consents for the procedure. All questions were addressed. A time out was performed prior to the initiation of the procedure. Maximal barrier sterile technique utilized including caps, mask, sterile gowns, sterile gloves, large sterile drape, hand hygiene, and chlorhexidine prep. Operators: Vernard Gambles, Suttle Left wrist was prepped and draped in standard fashion. Preprocedure ultrasound evaluation of the left radial artery demonstrates appropriate size (diameter of 0.28 cm) for radial access. Local anesthesia was provided at the planned needle entry site with 1% lidocaine. Puncture of the left radial artery was performed under direct ultrasound visualization with a 21 gauge micropuncture needle. Permanent image was captured and stored in the record. A microwire was introduced without resistance. The needle which was then exchanged for a 4/5 French slender sheath. The wire and inner stylet were removed. The standard radial cocktail was slowly administered, diluted with autologous arterial blood. A 5 French MG2 Glidecatheter was then, under direct fluoroscopic visualization, directed over a leading Coons wire to the abdominal aorta. The right internal iliac artery was then selected. Internal iliac angiography was performed at a steep ipsilateral oblique. The prostatic artery was identified. A 1.9 [place] Progreat Lambda microcatheter and 0.014 inch Synchro Soft microwire were introduced and used to select the prostatic artery. Cone beam CT was then performed to confirm this location. After selective arteriogram, superior branch was catheterized. Embolization was performed with 400 micron hydropearl microspheres. The catheter was withdrawn and the common trunk with distal inferior vesicle branches, which was embolized with a 2 mm coil. Catheter was redirected into inferior branch of the prostatic artery. After  confirmatory arteriogram demonstrated good prostatic blush and no significant collateral branches, distal branches embolized with 400 micron hydropearl microspheres. Follow-up arteriogram shows cessation of flow in distal branches, no evident complication. The micro system was removed and the 5 French catheter was retracted to the aortic bifurcation. Catheter was exchanged for a 135 cm device. Over Coons wire, the catheter was directed into the left internal iliac artery. Internal iliac angiography was performed at a steep ipsilateral oblique. The prostatic artery is again seen arising from the proximal anterior division of the internal iliac artery. A 1.9 [place] Progreat Lambda microcatheter and 0.014 inch Synchro Soft microwire were introduced and used to select the left prostatic artery. Left prostate artery angiogram was performed which demonstrated parenchymal blushing in the area of the expected left hemi prostate. Cone beam CT was then performed to confirm this location. Next, 400 micron hydropearl microspheres were then used to embolize the left hemi prostate. The proximal prostatic artery was embolized to hemostasis. The catheter was retracted partially. Completion angiogram was performed which demonstrated no evidence of persistent perfusion to the left hemi prostate. The catheters were then removed. Additional standard radial cocktail was administered slowly. A TR band was applied to the left wrist and the indwelling sheath was removed. The patient tolerated procedure well was transferred to the recovery area in good condition. FLUOROSCOPY: Radiation Exposure Index (as provided by the fluoroscopic device): 2508 mGy air Kerma COMPLICATIONS: COMPLICATIONS none IMPRESSION: 1. Technically successful bilateral prostatic artery embolization using 400 micron hydropearl microspheres. PLAN: Follow-up interventional radiology clinic 1 month for consideration of voiding trial. Electronically Signed   By: Lucrezia Europe  M.D.   On: 09/02/2021 18:15   IR Angiogram Selective Each Additional Vessel  Result Date:  09/02/2021 CLINICAL DATA:  BPH, lower urinary tract symptoms. See previous consultation EXAM: EXAM 1. ULTRASOUND-GUIDED ACCESS OF THE LEFT RADIAL ARTERY 2. BILATERAL INTERNAL ILIAC ANGIOGRAPHY 3. SELECTIVE BILATERAL PELVIC ANGIOGRAPHY 4. CONE BEAM CT ANGIOGRAM OF THE PELVIS 5. RIGHT PROSTATE ARTERY EMBOLIZATION WITH PARTICLES AND COIL 6.  LEFT  PROSTATE ARTERY EMBOLIZATION WITH PARTICLES 6. COMPLETION PELVIC ANGIOGRAPHY MEDICATIONS: Intravenous Fentanyl 143mg and Versed '3mg'$  were administered as conscious sedation during continuous monitoring of the patient's level of consciousness and physiological / cardiorespiratory status by the radiology RN, with a total moderate sedation time of 3 hours 30 minutes. TECHNIQUE: Informed consent was obtained from the patient following explanation of the procedure, risks, benefits and alternatives. The patient understands, agrees and consents for the procedure. All questions were addressed. A time out was performed prior to the initiation of the procedure. Maximal barrier sterile technique utilized including caps, mask, sterile gowns, sterile gloves, large sterile drape, hand hygiene, and chlorhexidine prep. Operators: HVernard Gambles Suttle Left wrist was prepped and draped in standard fashion. Preprocedure ultrasound evaluation of the left radial artery demonstrates appropriate size (diameter of 0.28 cm) for radial access. Local anesthesia was provided at the planned needle entry site with 1% lidocaine. Puncture of the left radial artery was performed under direct ultrasound visualization with a 21 gauge micropuncture needle. Permanent image was captured and stored in the record. A microwire was introduced without resistance. The needle which was then exchanged for a 4/5 French slender sheath. The wire and inner stylet were removed. The standard radial cocktail was slowly administered, diluted  with autologous arterial blood. A 5 French MG2 Glidecatheter was then, under direct fluoroscopic visualization, directed over a leading Coons wire to the abdominal aorta. The right internal iliac artery was then selected. Internal iliac angiography was performed at a steep ipsilateral oblique. The prostatic artery was identified. A 1.9 [place]ambda microcatheter and 0.014 inch Synchro Soft microwire were introduced and used to select the prostatic artery. Cone beam CT was then performed to confirm this location. After selective arteriogram, superior branch was catheterized. Embolization was performed with 400 micron hydropearl microspheres. The catheter was withdrawn and the common trunk with distal inferior vesicle branches, which was embolized with a 2 mm coil. Catheter was redirected into inferior branch of the prostatic artery. After confirmatory arteriogram demonstrated good prostatic blush and no significant collateral branches, distal branches embolized with 400 micron hydropearl microspheres. Follow-up arteriogram shows cessation of flow in distal branches, no evident complication. The micro system was removed and the 5 French catheter was retracted to the aortic bifurcation. Catheter was exchanged for a 135 cm device. Over Coons wire, the catheter was directed into the left internal iliac artery. Internal iliac angiography was performed at a steep ipsilateral oblique. The prostatic artery is again seen arising from the proximal anterior division of the internal iliac artery. A 1.9 [place]ambda microcatheter and 0.014 inch Synchro Soft microwire were introduced and used to select the left prostatic artery. Left prostate artery angiogram was performed which demonstrated parenchymal blushing in the area of the expected left hemi prostate. Cone beam CT was then performed to confirm this location. Next, 400 micron hydropearl microspheres were then used to embolize the left hemi prostate. The  proximal prostatic artery was embolized to hemostasis. The catheter was retracted partially. Completion angiogram was performed which demonstrated no evidence of persistent perfusion to the left hemi prostate. The catheters were then removed. Additional standard radial cocktail was administered slowly. A TR band  was applied to the left wrist and the indwelling sheath was removed. The patient tolerated procedure well was transferred to the recovery area in good condition. FLUOROSCOPY: Radiation Exposure Index (as provided by the fluoroscopic device): 2508 mGy air Kerma COMPLICATIONS: COMPLICATIONS none IMPRESSION: 1. Technically successful bilateral prostatic artery embolization using 400 micron hydropearl microspheres. PLAN: Follow-up interventional radiology clinic 1 month for consideration of voiding trial. Electronically Signed   By: Lucrezia Europe M.D.   On: 09/02/2021 18:15    Labs:  CBC: Recent Labs    08/20/21 0410 08/21/21 0500 09/02/21 0659 09/03/21 1233  WBC 8.0 9.6 7.3 11.6*  HGB 14.5 13.9 14.2 14.2  HCT 42.1 41.0 41.2 41.5  PLT 200 183 197 201    COAGS: Recent Labs    07/22/21 0001 09/02/21 0659  INR 1.0 1.0    BMP: Recent Labs    08/20/21 0410 08/21/21 0500 09/02/21 0659 09/03/21 1233  NA 137 140 134* 132*  K 4.0 4.0 3.9 4.1  CL 107 109 103 101  CO2 '23 24 23 24  '$ GLUCOSE 105* 107* 126* 102*  BUN '20 18 19 19  '$ CALCIUM 9.4 9.0 9.2 9.0  CREATININE 1.04 0.83 0.94 1.04  GFRNONAA >60 >60 >60 >60    LIVER FUNCTION TESTS: Recent Labs    07/22/21 0000 08/20/21 0410 08/21/21 0500 09/03/21 1233  BILITOT 0.8 0.5 0.7 0.8  AST '15 19 16 15  '$ ALT '15 17 17 15  '$ ALKPHOS 73 75 69 79  PROT 6.4* 7.0 6.4* 6.6  ALBUMIN 3.7 4.0 3.5 3.7    TUMOR MARKERS: No results for input(s): "AFPTM", "CEA", "CA199", "CHROMGRNA" in the last 8760 hours.  Assessment and Plan:  My impression is that the patient has done very well post prostate artery embolization.  He had moderate post PAE  syndrome limited to the first 48 hours, which has completely resolved. No hematuria or other  unexpected symptoms. Patient has already been scheduled for voiding trial with urology later this month, which I endorse.  Hopefully they can get the Foley catheter out.  If not, we can give a few more weeks for the prostate to involute and try again.  I think chances are good that he will not need a suprapubic catheter.  I reviewed the findings and plan with the patient and his daughter over the telephone.  They seemed to understand and did ask appropriate questions which were answered.  I will look forward to seeing results of his voiding trial, and we can determine follow-up interval after that.  Thank you for this interesting consult.  I greatly enjoyed meeting DERRIC DEALMEIDA and look forward to participating in their care.  A copy of this report was sent to the requesting provider on this date.  Electronically Signed: Rickard Rhymes 09/29/2021, 2:13 PM   I spent a total of    25 Minutes in remote  clinical consultation, greater than 50% of which was counseling/coordinating care for BPH status post prostate artery embolization.    Visit type: Audio only (telephone). Audio (no video) only due to patient's lack of internet/smartphone capability. Alternative for in-person consultation at [facility], [address], [place], [place]. This format is felt to be most appropriate for this patient at this time.  All issues noted in this document were discussed and addressed.

## 2021-11-29 ENCOUNTER — Telehealth: Payer: Self-pay | Admitting: Interventional Radiology

## 2021-11-29 NOTE — Telephone Encounter (Signed)
11/29/21-spoke with office to get results of voiding trial/stated patient has not yet had the trial/will send over any results from cultures/sb

## 2021-12-28 NOTE — Addendum Note (Signed)
Encounter addended by: Janine Limbo, RT on: 12/28/2021 10:19 AM  Actions taken: Imaging Exam ended

## 2022-03-02 ENCOUNTER — Emergency Department (HOSPITAL_COMMUNITY): Payer: Medicare Other

## 2022-03-02 ENCOUNTER — Emergency Department (HOSPITAL_COMMUNITY)
Admission: EM | Admit: 2022-03-02 | Discharge: 2022-03-02 | Disposition: A | Discharge status: Home / Self Care | Payer: Medicare Other | Attending: Emergency Medicine | Admitting: Emergency Medicine

## 2022-03-02 ENCOUNTER — Encounter (HOSPITAL_COMMUNITY): Payer: Self-pay | Admitting: Emergency Medicine

## 2022-03-02 DIAGNOSIS — Z79899 Other long term (current) drug therapy: Secondary | ICD-10-CM | POA: Diagnosis not present

## 2022-03-02 DIAGNOSIS — I10 Essential (primary) hypertension: Secondary | ICD-10-CM | POA: Insufficient documentation

## 2022-03-02 DIAGNOSIS — Z7982 Long term (current) use of aspirin: Secondary | ICD-10-CM | POA: Insufficient documentation

## 2022-03-02 DIAGNOSIS — R1013 Epigastric pain: Secondary | ICD-10-CM

## 2022-03-02 DIAGNOSIS — R1010 Upper abdominal pain, unspecified: Secondary | ICD-10-CM | POA: Diagnosis present

## 2022-03-02 LAB — CBC WITH DIFFERENTIAL/PLATELET
Abs Immature Granulocytes: 0.02 10*3/uL (ref 0.00–0.07)
Basophils Absolute: 0 10*3/uL (ref 0.0–0.1)
Basophils Relative: 1 %
Eosinophils Absolute: 0.4 10*3/uL (ref 0.0–0.5)
Eosinophils Relative: 6 %
HCT: 41.4 % (ref 39.0–52.0)
Hemoglobin: 13.7 g/dL (ref 13.0–17.0)
Immature Granulocytes: 0 %
Lymphocytes Relative: 29 %
Lymphs Abs: 2 10*3/uL (ref 0.7–4.0)
MCH: 29.1 pg (ref 26.0–34.0)
MCHC: 33.1 g/dL (ref 30.0–36.0)
MCV: 87.9 fL (ref 80.0–100.0)
Monocytes Absolute: 0.7 10*3/uL (ref 0.1–1.0)
Monocytes Relative: 10 %
Neutro Abs: 3.7 10*3/uL (ref 1.7–7.7)
Neutrophils Relative %: 54 %
Platelets: 181 10*3/uL (ref 150–400)
RBC: 4.71 MIL/uL (ref 4.22–5.81)
RDW: 13.1 % (ref 11.5–15.5)
WBC: 6.8 10*3/uL (ref 4.0–10.5)
nRBC: 0 % (ref 0.0–0.2)

## 2022-03-02 LAB — URINALYSIS, ROUTINE W REFLEX MICROSCOPIC
Bacteria, UA: NONE SEEN
Bilirubin Urine: NEGATIVE
Glucose, UA: NEGATIVE mg/dL
Hgb urine dipstick: NEGATIVE
Ketones, ur: NEGATIVE mg/dL
Leukocytes,Ua: NEGATIVE
Nitrite: NEGATIVE
Protein, ur: NEGATIVE mg/dL
Specific Gravity, Urine: 1.009 (ref 1.005–1.030)
pH: 5 (ref 5.0–8.0)

## 2022-03-02 LAB — COMPREHENSIVE METABOLIC PANEL
ALT: 509 U/L — ABNORMAL HIGH (ref 0–44)
AST: 200 U/L — ABNORMAL HIGH (ref 15–41)
Albumin: 3.5 g/dL (ref 3.5–5.0)
Alkaline Phosphatase: 225 U/L — ABNORMAL HIGH (ref 38–126)
Anion gap: 6 (ref 5–15)
BUN: 19 mg/dL (ref 8–23)
CO2: 26 mmol/L (ref 22–32)
Calcium: 8.8 mg/dL — ABNORMAL LOW (ref 8.9–10.3)
Chloride: 102 mmol/L (ref 98–111)
Creatinine, Ser: 1.14 mg/dL (ref 0.61–1.24)
GFR, Estimated: >60 mL/min (ref >60)
Glucose, Bld: 105 mg/dL — ABNORMAL HIGH (ref 70–99)
Potassium: 4.1 mmol/L (ref 3.5–5.1)
Sodium: 134 mmol/L — ABNORMAL LOW (ref 135–145)
Total Bilirubin: 0.7 mg/dL (ref 0.3–1.2)
Total Protein: 6.7 g/dL (ref 6.5–8.1)

## 2022-03-02 LAB — LIPASE, BLOOD: Lipase: 64 U/L — ABNORMAL HIGH (ref 11–51)

## 2022-03-02 MED ORDER — IOHEXOL 300 MG/ML  SOLN
100.0000 mL | Freq: Once | INTRAMUSCULAR | Status: AC | PRN
  Administered 2022-03-02: 100 mL via INTRAVENOUS

## 2022-03-02 NOTE — ED Triage Notes (Signed)
Pt states he was c/o upper abdominal pain ongoing for two weeks and went to PCP yesterday for blood work. It was found that pt had elevated lipase and concern for acute pancreatitis. Pt denies any alcohol use, N/V/D. No previous hx of same.

## 2022-03-02 NOTE — ED Provider Triage Note (Signed)
Emergency Medicine Provider Triage Evaluation Note  Ricky Bradley , a 89 y.o. male  was evaluated in triage.  Pt complains of gastric pain for the past week intermittently.  PCP called and told him elevated liver enzymes and lipase.  History of alcohol abuse.  He has had feeling of generalized abdominal bloating for several months in addition..  Review of Systems  Positive: Abdominal pain Negative: fever  Physical Exam  BP 133/78 (BP Location: Left Arm)   Pulse 96   Temp 97.8 F (36.6 C) (Oral)   Resp 18   SpO2 99%  Gen:   Awake, no distress   Resp:  Normal effort  MSK:   Moves extremities without difficulty  Other:    Medical Decision Making  Medically screening exam initiated at 5:02 PM.  Appropriate orders placed.  Ricky Bradley was informed that the remainder of the evaluation will be completed by another provider, this initial triage assessment does not replace that evaluation, and the importance of remaining in the ED until their evaluation is complete.     Gwenevere Abbot, [place] 03/02/22 1704

## 2022-03-02 NOTE — Discharge Instructions (Signed)
Please make an appointment with your primary care to be seen in a week to redo your lipase.  Please continue taking the famotidine and Protonix as they are prescribed.  Avoid any fatty foods, greasy foods, heavy protein shakes until you are able to see your primary care.  If symptoms worsen please return to ER.

## 2022-03-02 NOTE — ED Provider Notes (Signed)
[facility] DEPT Provider Note   CSN: [id number] Arrival date & time: 03/02/22  1625     History  Chief Complaint  Patient presents with   Abdominal Pain   *Daughter was present to assist in history  Ricky Bradley is a 89 y.o. male history of hypertension and dyslipidemia presented with 2 weeks of upper abdominal pain.  Patient was recently seen by PCP yesterday for blood work and noted to have elevated lipase and was referred to ED to be evaluated for pancreatitis.  Patient stated that he gets a discomfort in the epigastric region after drinking a large amount of protein shake.  Patient states this discomfort is a burning sensation but does not endorse any pain.  Patient was given Protonix and famotidine and states that this has helped with the pain.  Patient states that the pain has been present since he had a bout with diarrhea at the beginning of the year and thinks it may be related.  Patient denies chest pain, shortness of breath, dysuria, nausea/vomiting, constipation, fevers, abdominal pain radiating to back.    Home Medications Prior to Admission medications   Medication Sig Start Date End Date Taking? Authorizing Provider  acetaminophen (TYLENOL) 500 MG tablet Take 1,000 mg by mouth as needed for headache ('stuffy nose').    [provider]  amLODipine (NORVASC) 5 MG tablet Take 5 mg by mouth every evening. 09/20/20   [provider]  aspirin 81 MG EC tablet Take 81 mg by mouth daily.     [provider]  atorvastatin (LIPITOR) 10 MG tablet Take 10 mg by mouth daily. 01/07/21   [provider]  benazepril (LOTENSIN) 10 MG tablet Take 10 mg by mouth daily. 12/18/20   [provider]  bisacodyl (DULCOLAX) 5 MG EC tablet Take 1 tablet (5 mg total) by mouth daily as needed for moderate constipation. 09/02/21   Jacqualine Mau, NP  BISACODYL 5 MG EC tablet Take 10 mg by mouth as needed (constipation). Only  take if constipated for 3 days 04/27/21   [provider]  cetirizine (ZYRTEC) 10 MG tablet Take 10 mg by mouth at bedtime. 11/17/20   [provider]  citalopram (CELEXA) 20 MG tablet Take 20 mg by mouth daily.    [provider]  cyclobenzaprine (FLEXERIL) 5 MG tablet Take 1 tablet (5 mg total) by mouth 3 (three) times daily. 08/22/21   Nita Sells, MD  Docusate Calcium (STOOL SOFTENER PO) Take 2 capsules by mouth at bedtime.    [provider]  finasteride (PROSCAR) 5 MG tablet Take 5 mg by mouth daily. 02/10/21   [provider]  fluticasone (FLONASE) 50 MCG/ACT nasal spray Place 2 sprays into both nostrils daily.    [provider]  meloxicam (MOBIC) 7.5 MG tablet Take 7.5 mg by mouth at bedtime. 02/24/21   [provider]  Olopatadine HCl 0.2 % SOLN Place 1 drop into both eyes daily. 11/26/18   [provider]  oxyCODONE-acetaminophen (PERCOCET/ROXICET) 5-325 MG tablet Take 1 tablet by mouth every 6 (six) hours as needed for severe pain. 08/22/21   Nita Sells, MD  polyethylene glycol (MIRALAX / GLYCOLAX) 17 g packet Take 17 g by mouth at bedtime.    [provider]  Probiotic Product (PROBIOTIC PO) Take 1 tablet by mouth daily.    [provider]  Simethicone (GAS-X PO) Take 1 tablet by mouth as needed (gas).    [provider]  Tamsulosin HCl (FLOMAX) 0.4 MG CAPS Take 0.4 mg by mouth 2 (two) times daily.    [provider]  traZODone (DESYREL) 50 MG tablet Take 75 mg by mouth at bedtime. 07/12/21   [provider]      Allergies    Vicodin [hydrocodone-acetaminophen]    Review of Systems   Review of Systems  Gastrointestinal:  Positive for abdominal pain.  See HPI  Physical Exam Updated Vital Signs BP 137/82   Pulse 85   Temp 97.8 F (36.6 C) (Oral)   Resp 18   SpO2 98%  Physical Exam Vitals and nursing note reviewed.  Constitutional:       General: He is not in acute distress.    Appearance: He is well-developed.  HENT:     Head: Normocephalic and atraumatic.  Eyes:     Extraocular Movements: Extraocular movements intact.     Conjunctiva/sclera: Conjunctivae normal.     Pupils: Pupils are equal, round, and reactive to light.  Cardiovascular:     Rate and Rhythm: Normal rate and regular rhythm.     Heart sounds: No murmur heard. Pulmonary:     Effort: Pulmonary effort is normal. No respiratory distress.     Breath sounds: Normal breath sounds.  Abdominal:     General: Abdomen is flat. Bowel sounds are normal.     Palpations: Abdomen is soft.     Tenderness: There is no abdominal tenderness.     Hernia: No hernia is present.  Musculoskeletal:        General: No swelling.     Cervical back: Neck supple.  Skin:    General: Skin is warm and dry.     Capillary Refill: Capillary refill takes less than 2 seconds.  Neurological:     General: No focal deficit present.     Mental Status: He is alert and oriented to person, place, and time.  Psychiatric:        Mood and Affect: Mood normal.     ED Results / Procedures / Treatments   Labs (all labs ordered are listed, but only abnormal results are displayed) Labs Reviewed  COMPREHENSIVE METABOLIC PANEL - Abnormal; Notable for the following components:      Result Value   Sodium 134 (*)    Glucose, Bld 105 (*)    Calcium 8.8 (*)    AST 200 (*)    ALT 509 (*)    Alkaline Phosphatase 225 (*)    All other components within normal limits  LIPASE, BLOOD - Abnormal; Notable for the following components:   Lipase 64 (*)    All other components within normal limits  CBC WITH DIFFERENTIAL/PLATELET  URINALYSIS, ROUTINE W REFLEX MICROSCOPIC    EKG None  Radiology CT ABDOMEN PELVIS W CONTRAST  Result Date: 03/02/2022 CLINICAL DATA:  Upper abdominal pain, elevated lipase EXAM: CT ABDOMEN AND PELVIS WITH CONTRAST TECHNIQUE: Multidetector CT imaging of the abdomen and  pelvis was performed using the standard protocol following bolus administration of intravenous contrast. RADIATION DOSE REDUCTION: This exam was performed according to the departmental dose-optimization program which includes automated exposure control, adjustment of the mA and/or kV according to patient size and/or use of iterative reconstruction technique. CONTRAST:  195m OMNIPAQUE IOHEXOL 300 MG/ML  SOLN COMPARISON:  08/05/2021 FINDINGS: Lower chest: No acute pleural or parenchymal lung disease. Hepatobiliary: The liver is unremarkable. Prior cholecystectomy, with expected postsurgical dilatation of the common bile duct. Pancreas: Unremarkable. No pancreatic ductal dilatation or surrounding inflammatory  changes. Likely pancreatic divisum. Spleen: Normal in size without focal abnormality. Adrenals/Urinary Tract: Adrenal glands are unremarkable. Kidneys are normal, without renal calculi, focal lesion, or hydronephrosis. Bladder is unremarkable. Stomach/Bowel: No bowel obstruction or ileus. Normal appendix right lower quadrant. Distal colonic diverticulosis without diverticulitis. No bowel wall thickening or inflammatory change. Vascular/Lymphatic: Aortic atherosclerosis. No enlarged abdominal or pelvic lymph nodes. Reproductive: The prostate is decreased in size after interval prostate artery embolization, but remains enlarged measuring 5.6 x 4.5 by 6.4 cm. Other: No free fluid or free intraperitoneal gas. No abdominal wall hernia. Musculoskeletal: No acute or destructive bony lesions. Reconstructed images demonstrate no additional findings. IMPRESSION: 1. No acute intra-abdominal or intrapelvic process. No CT evidence of acute pancreatitis. 2. Distal colonic diverticulosis without diverticulitis. 3. Enlarged prostate, of though decreased in size since prior study after interval prostate artery embolization. 4.  Aortic Atherosclerosis (ICD10-I70.0). Electronically Signed   By: Randa Ngo M.D.   On: 03/02/2022  19:58    Procedures Procedures    Medications Ordered in ED Medications  iohexol (OMNIPAQUE) 300 MG/ML solution 100 mL (100 mLs Intravenous Contrast Given 03/02/22 1923)    ED Course/ Medical Decision Making/ A&P                             Medical Decision Making  ALVAN CULPEPPER 89 y.o. presented today for upper abdominal pain. Working DDx that I considered at this time includes, but not limited to, pancreatitis, hepatitis, gastritis, acid reflux, cholecystitis.  Review of prior external notes: 03/01/22 Office Visit  Unique Tests and My Interpretation:  UA: Unremarkable CBC: Unremarkable Lipase: Elevated 64 CBC: Elevated AST/ALT 200/509, elevated alk phos 225 EKG: RBBB, regular rate; similar to previous EKGs CT abdomen pelvis with contrast: Diverticulosis, calcification within the aorta, enlarged prostate these are all chronic findings; no acute findings  Discussion with Independent Historian: Daughter  Discussion of Management of Tests: None  Risk: Low:  - based on diagnostic testing/clinical impression and treatment plan   Medium:  - prescription drug management - decision regarding minor surgery w/identified patient or procedure risk factors - major elective surgery w/o identified patient or procedure risk factors - diagnosis or treatment limited by social determinants of health High:  - drug therapy requiring monitoring for toxicity - major elective surgery w/identified patient or procedure risk factors - emergency major surgery - hospitalization or escalation of hospital-level care - decision not to resuscitate or to de-escalate care because of poor prognosis - parental controlled substances  Risk Stratification Score: None  Staffed with Lorin R PA-C  R/o DDx: Pancreatitis: Negative CT scan and patient does not present in atypical pancreatitis fashion Hepatitis: Patient is not jaundiced and has no previous history of hepatitis along with negative CT  scan Cholecystitis: Patient has had CCY  Plan: Patient presented with epigastric pain that is exacerbated by food.  Patient was told to come to the ER after having an elevated lipase of 881 yesterday however lipase today is 64 and patient is asymptomatic along with a negative CT scan.  Patient's liver enzymes and alk phos are noted to be elevated however patient is not jaundiced and is asymptomatic.  I suspect since the patient's symptoms have been improved with Protonix and famotidine along with the patient's history of acid reflux that this is another episode of his acid reflux as all other diagnoses in the differential have been ruled out and patient remained stable and asymptomatic in  ED.  I do not suspect that the patient's symptoms are related to the diarrhea he had earlier this year due to the fact the diarrhea has since resolved and patient denied any recent diarrheal symptoms along with the fact that abdominal pain is exacerbated by food namely protein shakes.  I spoke with the patient and daughter and stated that they will need to follow-up with the primary care in a week to redraw lipase labs as it is unusual lipase to drop from 881 to 64 in 1 day.  Patient is currently asymptomatic and vitals are stable for discharge.  Patient and daughter verbalized agreement plan.          Final Clinical Impression(s) / ED Diagnoses Final diagnoses:  Epigastric pain    Rx / DC Orders ED Discharge Orders     None         Elvina Sidle 03/02/22 2120    Jeanell Sparrow, DO 03/04/22 2236

## 2022-03-02 NOTE — ED Notes (Signed)
Labeled specimen cup provided to pt for U/A collection per MD order. [employer]

## 2023-01-26 ENCOUNTER — Telehealth (INDEPENDENT_AMBULATORY_CARE_PROVIDER_SITE_OTHER): Payer: Self-pay | Admitting: Otolaryngology

## 2023-01-26 NOTE — Telephone Encounter (Signed)
Confirmed 12/16 appt @ 2pm w/ Dr. Suszanne Conners with daughter

## 2023-01-29 ENCOUNTER — Ambulatory Visit (INDEPENDENT_AMBULATORY_CARE_PROVIDER_SITE_OTHER): Payer: Medicare Other | Admitting: Audiology

## 2023-01-29 ENCOUNTER — Encounter (INDEPENDENT_AMBULATORY_CARE_PROVIDER_SITE_OTHER): Payer: Self-pay

## 2023-01-29 ENCOUNTER — Ambulatory Visit (INDEPENDENT_AMBULATORY_CARE_PROVIDER_SITE_OTHER): Payer: Medicare Other

## 2023-01-29 VITALS — Ht 72.0 in | Wt 200.0 lb

## 2023-01-29 DIAGNOSIS — H903 Sensorineural hearing loss, bilateral: Secondary | ICD-10-CM | POA: Diagnosis not present

## 2023-01-29 NOTE — Progress Notes (Signed)
     [address], [address] [place], [place] [address] [phone]  Hearing Aid Check     Ricky Bradley asked for his hearing aids to be cleaned and checked while he was in the clinic.   Accompanied [id number]   Right Left  Hearing aid manufacturer Deno Etienne M30-R SN:[id number] Deno Etienne M30-R SN:[id number]  Hearing aid style Receiver in the canal Receiver in the canal  Hearing aid battery rechargeable rechargeable  Receiver    Dome/ custom earpiece    Retention wire yes yes  Warranty expiration date 11-18-2019 11-18-2019  Loss and Damage expired expired  Additional accessories Expiration date    Initial fitting date 08-27-2018 08-27-2018  Device was fit at: [facility]  Hearing aid services unbundled    unbundled       Chief complaint: Patient wishes his hearing aid sto be reprogrammed based on today's hearing test results.  Actions taken:  Cleaned both devices and reprogrammed based on today's hearing test. Patient reported to like the changes. Patient and daughter were oriented that live speech mapping would be ideal, but currently ours is not working. If he cannot get used to the changes in 3 weeks, he should return for a hearing aid check appointment.  Services fee: $30 was paid at checkout for the services rendered today.     Recommend: Return for a hearing aid check , as needed. Return for a hearing evaluation and to see an ENT, if concerns with hearing changes arise.    Khalise Billard MARIE LEROUX-MARTINEZ, AUD

## 2023-01-29 NOTE — Progress Notes (Signed)
  [address], [address] [place], [place] [address] [phone]  Audiological Evaluation    Name: Ricky Bradley     DOB:   10-11-1933      MRN:   [id number]                                                                                     Service Date: 01/29/2023     Accompanied by: daughter   Patient comes today after Dr. Suszanne Conners, ENT sent a referral for a hearing evaluation due to concerns with hearing loss.   Symptoms Yes Details  Hearing loss  [x]  Known bilateral hearing loss , right side slightly better than the left ear per audiogram form 2020 at [facility].  Tinnitus  []    Ear pain/ Ear infections  []    Balance problems  []    Noise exposure  []    Previous ear surgeries  []    Family history  []    Amplification  [x]  Has a set of Phonak receiver in the canal hearing aids.   Other  []      Otoscopy: Right ear: Clear external ear canals and notable landmarks visualized on the tympanic membrane. Left ear:  Clear external ear canals and notable landmarks visualized on the tympanic membrane.  Tympanometry: Right ear: Type A- Normal external ear canal volume with normal middle ear pressure and tympanic membrane compliance Left ear: Type A- Normal external ear canal volume with normal middle ear pressure and tympanic membrane compliance  Pure tone Audiometry: Both ears-  Mild to severe sensorineural hearing loss from 125 Hz - 8000 Hz.    The hearing test results were completed under headphones and re-checked with inserts and results are deemed to be of good reliability. Test technique:  conventional     Speech Audiometry: Right ear- Speech Reception Threshold (SRT) was obtained at 50 dBHL Left ear-Speech Reception Threshold (SRT) was obtained at 55 dBHL   Word Recognition Score Tested using NU-6 (MLV) Right ear: 48% was obtained at a presentation level of 90 dBHL with contralateral masking which is deemed as  poor Left ear: 44% was obtained at a presentation  level of 90 dBHL with contralateral masking which is deemed as  poor    Impression: The hearing int he right ear declined a little when compared to a hearing test completed in 2020 at Dr[facility].   Recommendations: Follow up with ENT as scheduled for today. Return for a hearing evaluation if concerns with hearing changes arise or per MD recommendation. Consider a hearing aid check to change hearing aid's programming.   Raylin Diguglielmo MARIE LEROUX-MARTINEZ, AUD

## 2023-01-30 DIAGNOSIS — H903 Sensorineural hearing loss, bilateral: Secondary | ICD-10-CM | POA: Insufficient documentation

## 2023-01-30 NOTE — Progress Notes (Signed)
Patient ID: Ricky Bradley, male   DOB: 1933-11-05, 89 y.o.   MRN: [id number]  CC: Progressive hearing loss  HPI:  Ricky Bradley is a 89 y.o. male who presents today with his wife, complaining of bilateral progressive hearing loss.  The patient was last seen in 2020.  At that time, he was noted to have bilateral high-frequency sensorineural hearing loss.  He was fitted with bilateral hearing aids.  According to the wife, the patient's hearing has progressively worsened.  The patient denies any significant otalgia, otorrhea, or vertigo.  He has no previous otologic surgery.  Past Medical History:  Diagnosis Date   Enlarged prostate    GERD (gastroesophageal reflux disease)    High cholesterol    High cholesterol    Hypertension    no longer takes bp med-resolved.   RBBB     Past Surgical History:  Procedure Laterality Date   ANTERIOR CERVICAL DECOMP/DISCECTOMY FUSION  01/16/2011   Procedure: ANTERIOR CERVICAL DECOMPRESSION/DISCECTOMY FUSION 1 LEVEL;  Surgeon: Cristi Loron;  Location: [facility];  Service: Neurosurgery;  Laterality: N/A;  Cervical six-seven  Anterior Cervical Decomp[ression Fusion   ANTERIOR CERVICAL DECOMP/DISCECTOMY FUSION  01/23/2011   Procedure: ANTERIOR CERVICAL DECOMPRESSION/DISCECTOMY FUSION 1 LEVEL/HARDWARE REMOVAL;  Surgeon: Cristi Loron;  Location: [facility];  Service: Neurosurgery;  Laterality: N/A;  Evacuation of Cervical Six-Seven Hematoma   BACK SURGERY     CHOLECYSTECTOMY  11/01/10   EYE SURGERY  2008   IR 3D INDEPENDENT WKST  09/02/2021   IR ANGIOGRAM PELVIS SELECTIVE OR SUPRASELECTIVE  09/02/2021   IR ANGIOGRAM SELECTIVE EACH ADDITIONAL VESSEL  09/02/2021   IR ANGIOGRAM SELECTIVE EACH ADDITIONAL VESSEL  09/02/2021   IR EMBO TUMOR ORGAN ISCHEMIA INFARCT INC GUIDE ROADMAPPING  09/02/2021   IR RADIOLOGIST EVAL & MGMT  09/29/2021   IR US GUIDE VASC ACCESS LEFT  09/02/2021   JOINT REPLACEMENT  2001  and 2002   both knees   laminectomy  08/2010    POSTERIOR CERVICAL FUSION/FORAMINOTOMY  11/06/2011   Procedure: POSTERIOR CERVICAL FUSION/FORAMINOTOMY LEVEL 1;  Surgeon: Cristi Loron, MD;  Location: [facility];  Service: Neurosurgery;  Laterality: N/A;  C67 laminectomy with posterior cervical fusion with instrumentation   REPLACEMENT TOTAL KNEE      No family history on file.  Social History:  reports that he quit smoking about 51 years ago. His smoking use included cigarettes. He started smoking about 53 years ago. He has a 1 pack-year smoking history. He has never used smokeless tobacco. He reports that he does not drink alcohol and does not use drugs.  Allergies:  Allergies  Allergen Reactions   Vicodin [Hydrocodone-Acetaminophen] Hives and Rash    On neck and chest.    Prior to Admission medications   Medication Sig Start Date End Date Taking? Authorizing Provider  acetaminophen (TYLENOL) 500 MG tablet Take 1,000 mg by mouth as needed for headache ('stuffy nose').   Yes [provider]  amLODipine (NORVASC) 5 MG tablet Take 5 mg by mouth every evening. 09/20/20  Yes [provider]  aspirin 81 MG EC tablet Take 81 mg by mouth daily.    Yes [provider]  atorvastatin (LIPITOR) 10 MG tablet Take 10 mg by mouth daily. 01/07/21  Yes [provider]  benazepril (LOTENSIN) 10 MG tablet Take 10 mg by mouth daily. 12/18/20  Yes [provider]  bisacodyl (DULCOLAX) 5 MG EC tablet Take 1 tablet (5 mg total) by  mouth daily as needed for moderate constipation. 09/02/21  Yes Alene Mires, NP  BISACODYL 5 MG EC tablet Take 10 mg by mouth as needed (constipation). Only take if constipated for 3 days 04/27/21  Yes [provider]  cetirizine (ZYRTEC) 10 MG tablet Take 10 mg by mouth at bedtime. 11/17/20  Yes [provider]  citalopram (CELEXA) 20 MG tablet Take 20 mg by mouth daily.   Yes [provider]  cyclobenzaprine (FLEXERIL) 5 MG tablet Take 1 tablet (5 mg  total) by mouth 3 (three) times daily. 08/22/21  Yes Rhetta Mura, MD  Docusate Calcium (STOOL SOFTENER PO) Take 2 capsules by mouth at bedtime.   Yes [provider]  finasteride (PROSCAR) 5 MG tablet Take 5 mg by mouth daily. 02/10/21  Yes [provider]  fluticasone (FLONASE) 50 MCG/ACT nasal spray Place 2 sprays into both nostrils daily.   Yes [provider]  meloxicam (MOBIC) 7.5 MG tablet Take 7.5 mg by mouth at bedtime. 02/24/21  Yes [provider]  Olopatadine HCl 0.2 % SOLN Place 1 drop into both eyes daily. 11/26/18  Yes [provider]  oxyCODONE-acetaminophen (PERCOCET/ROXICET) 5-325 MG tablet Take 1 tablet by mouth every 6 (six) hours as needed for severe pain. 08/22/21  Yes Rhetta Mura, MD  polyethylene glycol (MIRALAX / GLYCOLAX) 17 g packet Take 17 g by mouth at bedtime.   Yes [provider]  Probiotic Product (PROBIOTIC PO) Take 1 tablet by mouth daily.   Yes [provider]  Simethicone (GAS-X PO) Take 1 tablet by mouth as needed (gas).   Yes [provider]  Tamsulosin HCl (FLOMAX) 0.4 MG CAPS Take 0.4 mg by mouth 2 (two) times daily.   Yes [provider]  traZODone (DESYREL) 50 MG tablet Take 75 mg by mouth at bedtime. 07/12/21  Yes [provider]    Height 6' (1.829 m), weight 200 lb (90.7 kg). Exam: General: Communicates without difficulty, well nourished, no acute distress. Head: Normocephalic, no evidence injury, no tenderness, facial buttresses intact without stepoff. Face/sinus: No tenderness to palpation and percussion. Facial movement is normal and symmetric. Eyes: PERRL, EOMI. No scleral icterus, conjunctivae clear. Neuro: CN II exam reveals vision grossly intact.  No nystagmus at any point of gaze. Ears: Auricles well formed without lesions.  Ear canals are intact without mass or lesion.  No erythema or edema is appreciated.  The TMs are intact without fluid.  Nose: External evaluation reveals normal support and skin without lesions.  Dorsum is intact.  Anterior rhinoscopy reveals normal mucosa over anterior aspect of inferior turbinates and intact septum.  No purulence noted. Oral:  Oral cavity and oropharynx are intact, symmetric, without erythema or edema.  Mucosa is moist without lesions. Neck: Full range of motion without pain.  There is no significant lymphadenopathy.  No masses palpable.  Thyroid bed within normal limits to palpation.  Parotid glands and submandibular glands equal bilaterally without mass.  Trachea is midline. Neuro:  CN 2-12 grossly intact.   His hearing test shows bilateral progressive high-frequency sensorineural hearing loss.  Assessment: 1.  Bilateral progressive high-frequency sensorineural hearing loss, likely secondary to routine presbycusis. 2.  His ear canals, tympanic membranes, and middle ear spaces are all normal.  Plan: 1.  The physical exam findings and the hearing test results are reviewed with the patient and his wife. 2.  Continue the use of his hearing aids.  His hearing aids are adjusted today. 3.  The patient will return for reevaluation in 1 year, sooner if needed.  Nicholaus Steinke W Marcellus Pulliam 01/30/2023, 9:50 AM

## 2023-02-06 ENCOUNTER — Encounter: Payer: Self-pay | Admitting: Audiology

## 2024-01-29 ENCOUNTER — Ambulatory Visit (INDEPENDENT_AMBULATORY_CARE_PROVIDER_SITE_OTHER): Payer: Medicare Other | Admitting: Otolaryngology

## 2024-01-29 ENCOUNTER — Encounter (INDEPENDENT_AMBULATORY_CARE_PROVIDER_SITE_OTHER): Payer: Self-pay | Admitting: Otolaryngology

## 2024-01-29 VITALS — BP 116/72 | HR 96 | Ht 72.0 in | Wt 210.0 lb

## 2024-01-29 DIAGNOSIS — H903 Sensorineural hearing loss, bilateral: Secondary | ICD-10-CM

## 2024-01-29 NOTE — Progress Notes (Signed)
 Patient ID: Ricky Bradley, male   DOB: 01-11-1934, 89 y.o.   MRN: [id number]  Follow up: Progressive hearing loss  History of Present Illness Ricky Bradley is an 89 year old male who presents with progressive hearing loss.  He returns today complaining of progressive hearing difficulty.  He was previously noted to have bilateral high-frequency sensorineural hearing loss.  He was fitted with bilateral hearing aids.  Despite the use of his hearing aids, he continues to have hearing difficulty.  No pain or drainage reported. He has been using a hearing aid for the right ear, which is functioning, but the left one is not. He has been wearing the right hearing aid for about a week and is able to hear with it. His hearing aids are rechargeable.  Exam: General: Communicates without difficulty, well nourished, no acute distress. Head: Normocephalic, no evidence injury, no tenderness, facial buttresses intact without stepoff. Face/sinus: No tenderness to palpation and percussion. Facial movement is normal and symmetric. Eyes: PERRL, EOMI. No scleral icterus, conjunctivae clear. Neuro: CN II exam reveals vision grossly intact.  No nystagmus at any point of gaze. Ears: Auricles well formed without lesions.  Ear canals are intact without mass or lesion.  No erythema or edema is appreciated.  The TMs are intact without fluid. Nose: External evaluation reveals normal support and skin without lesions.  Dorsum is intact.  Anterior rhinoscopy reveals normal mucosa over anterior aspect of inferior turbinates and intact septum.  No purulence noted. Oral:  Oral cavity and oropharynx are intact, symmetric, without erythema or edema.  Mucosa is moist without lesions. Neck: Full range of motion without pain.  There is no significant lymphadenopathy.  No masses palpable.  Thyroid bed within normal limits to palpation.  Parotid glands and submandibular glands equal bilaterally without mass.  Trachea is midline. Neuro:  CN 2-12  grossly intact.   Assessment & Plan Bilateral progressive sensorineural hearing loss Physical examination reveals normal ear canals, eardrums, and middle ear space with no wax, fluid, or infection. Hearing aids are old, and the right one is not functioning properly.  - Referred to audiologist for repeat hearing test and evaluation of hearing aids. - The physical exam findings are reviewed with the patient.  He is reassured that no middle ear effusion or infection is noted. - The patient will return for reevaluation in 1 year, sooner if needed.

## 2024-03-14 ENCOUNTER — Ambulatory Visit (INDEPENDENT_AMBULATORY_CARE_PROVIDER_SITE_OTHER): Admitting: Audiology

## 2024-03-14 DIAGNOSIS — H903 Sensorineural hearing loss, bilateral: Secondary | ICD-10-CM

## 2024-03-14 NOTE — Progress Notes (Signed)
" °  [address], [address] [place], [place] [address] [phone]  Hearing Aid Check     SAURAV CRUMBLE comes for a scheduled appointment for a hearing aid check.  Time in:2:10pm Time out: 2:45pm Accompanied ab:[email or web address]   Right Left  Hearing aid manufacturer Renaldo Kirschner M30-R [id number] Phonak Audeo M30-R [id number]  Hearing aid style Receiver in the canal Receiver in the canal  Hearing aid battery rechargeable rechargeable  Receiver  64M  3P  Dome/ custom earpiece  large double dome  large double dome  Retention wire yes yes  Warranty expiration date 11-18-2019 11-18-2019  Loss and Damage expired expired  Additional accessories Expiration date      Initial fitting date 08-27-2018 08-27-2018  Device was fit at: [facility]   Patient reports decreased hearing benefit from his hearing aids and notes that the rechargeable batteries are not lasting as long as they previously did. The patients daughter also requested instruction on how to clean the hearing aids and make setting adjustments using the mobile phone application.  Actions Taken: Visual inspection and listening check revealed that both hearing aids required cleaning. Wax filters were replaced bilaterally. The left receiver was replaced with a higher power strength receiver from the loaner bin, and the patient agreed to use this receiver. A power strength receiver for the right ear was not available in either the new inventory or the loaner bin; therefore, the right receiver could not be changed at this time. Live Speech Mapping was completed, resulting in significant gain adjustments. The patient now meets NAL-NL2 prescription targets bilaterally for soft, average, and loud speech levels, as well as MPO.    Patient / Family Education Instructions on proper hearing aid cleaning and maintenance were provided to both the patient and his daughter. Dr. Lacinda assisted with connecting the  patients hearing aids to the daughters phone and demonstrated how to use the app to make appropriate setting changes to assist the patient in various listening situations. Reviewed the importance of daily hearing aid use and to allow 3 -4 weeks for him to get used to hearing better.  Counseling / Recommendations Discussed concerns regarding declining battery performance and recommended considering new hearing aid devices in the future. Reviewed out-of-warranty repair fees for polishing or replacing rechargeable batteries.    Services fee: $60 was paid at checkout. Spent 35 minutes repairing sand reprogramming the hearing aid.   Recommend: Return for a hearing aid check , as needed. Return for a hearing evaluation and to see an ENT, if concerns with hearing changes arise.    Naija Troost MARIE LEROUX-MARTINEZ, AUD "

## 2024-03-14 NOTE — Progress Notes (Signed)
" °  [address] [place], [place] [address] [phone]  Audiological Evaluation    Name: Ricky Bradley     DOB:   1933-03-12      MRN:   [id number]                                                                                     Service Date: 03/14/2024     Accompanied by: daughter   Patient comes today after Dr. Karis, ENT sent a referral for a hearing evaluation due to concerns with slowly progressing hearing changes.   Symptoms Yes Details  Hearing loss  [x]  01-29-23: Both ears-  Mild to severe sensorineural hearing loss from 125 Hz - 8000 Hz.   Tinnitus  [x]  Both , worse in the left ear - comes and goes  Ear pain/ infections/pressure  [x]  Left ear popped a week ago  Balance problems  []    Noise exposure history  [x]  Worked at a noisy telephone office  Previous ear surgeries  []    Family history of hearing loss  [x]  Father, with age; sister unclear  Amplification  []  Has a set of Phonak receiver in the canal hearing aids.   Other  []      Otoscopy: Right ear: clear external ear canal and notable landmarks visualized on the tympanic membrane. Left ear:  clear external ear canal and notable landmarks visualized on the tympanic membrane.  Tympanometry: Right ear: Type A - Normal external ear canal volume with normal middle ear pressure and normal tympanic membrane compliance. Findings are consistent with normal middle ear function. Left ear: Type A - Normal external ear canal volume with normal middle ear pressure and normal tympanic membrane compliance. Findings are consistent with normal middle ear function.   Hearing Evaluation The hearing test results were completed under inserts and re-checked with headphones  and results are deemed to be of good reliability. Test technique:  conventional    Pure tone Audiometry: Bilateral mild sloping to severe sensorineural hearing loss from 125 Hz - 8000 Hz.  Speech Audiometry: Right ear- Speech Reception Threshold  (SRT) was obtained at 55 dBHL. Left ear-Speech Reception Threshold (SRT) was obtained at 65 dBHL.   Word Recognition Score Tested using NU-6 (recorded) Right ear: 52% was obtained at a presentation level of 95 dBHL with contralateral masking which is deemed as  poor. Left ear: 58% was obtained at a presentation level of 95 dBHL with contralateral masking which is deemed as  poor.   Impression: There is not a significant difference in pure-tone thresholds between ears. There is not a significant difference in the word recognition score in between ears.    Recommendations: Follow up with ENT as scheduled. Return for a hearing evaluation if concerns with hearing changes arise or per MD recommendation. Hearing aid check to verify hearing aid function.   [name], AUD  "
